# Patient Record
Sex: Female | Born: 1935 | Hispanic: No | Marital: Married | Smoking: Never smoker
Health system: Southern US, Community
[De-identification: ages and names within clinical notes are randomized; demographics above are authoritative.]

## PROBLEM LIST (undated history)

## (undated) DIAGNOSIS — I2510 Atherosclerotic heart disease of native coronary artery without angina pectoris: Secondary | ICD-10-CM

## (undated) DIAGNOSIS — M79609 Pain in unspecified limb: Secondary | ICD-10-CM

## (undated) DIAGNOSIS — F329 Major depressive disorder, single episode, unspecified: Secondary | ICD-10-CM

## (undated) DIAGNOSIS — R079 Chest pain, unspecified: Secondary | ICD-10-CM

## (undated) DIAGNOSIS — J9690 Respiratory failure, unspecified, unspecified whether with hypoxia or hypercapnia: Secondary | ICD-10-CM

## (undated) DIAGNOSIS — D649 Anemia, unspecified: Secondary | ICD-10-CM

## (undated) DIAGNOSIS — N183 Chronic kidney disease, stage 3 unspecified: Secondary | ICD-10-CM

## (undated) DIAGNOSIS — I639 Cerebral infarction, unspecified: Secondary | ICD-10-CM

## (undated) DIAGNOSIS — I10 Essential (primary) hypertension: Secondary | ICD-10-CM

## (undated) DIAGNOSIS — F32A Depression, unspecified: Secondary | ICD-10-CM

## (undated) DIAGNOSIS — I429 Cardiomyopathy, unspecified: Secondary | ICD-10-CM

## (undated) DIAGNOSIS — I509 Heart failure, unspecified: Secondary | ICD-10-CM

## (undated) DIAGNOSIS — F419 Anxiety disorder, unspecified: Secondary | ICD-10-CM

## (undated) DIAGNOSIS — N289 Disorder of kidney and ureter, unspecified: Secondary | ICD-10-CM

## (undated) HISTORY — PX: CARDIAC SURGERY: SHX584

## (undated) HISTORY — DX: Chest pain, unspecified: R07.9

## (undated) HISTORY — DX: Pain in unspecified limb: M79.609

## (undated) HISTORY — DX: Heart failure, unspecified: I50.9

---

## 1999-02-23 ENCOUNTER — Inpatient Hospital Stay (HOSPITAL_COMMUNITY): Admission: AD | Admit: 1999-02-23 | Discharge: 1999-03-09 | Payer: Self-pay | Admitting: Cardiovascular Disease

## 1999-02-23 ENCOUNTER — Encounter: Payer: Self-pay | Admitting: Cardiovascular Disease

## 1999-02-24 ENCOUNTER — Encounter: Payer: Self-pay | Admitting: Thoracic Surgery (Cardiothoracic Vascular Surgery)

## 1999-02-24 HISTORY — PX: CORONARY ARTERY BYPASS GRAFT: SHX141

## 1999-02-25 ENCOUNTER — Encounter: Payer: Self-pay | Admitting: Thoracic Surgery (Cardiothoracic Vascular Surgery)

## 1999-02-26 ENCOUNTER — Encounter: Payer: Self-pay | Admitting: Thoracic Surgery (Cardiothoracic Vascular Surgery)

## 1999-02-27 ENCOUNTER — Encounter: Payer: Self-pay | Admitting: Thoracic Surgery (Cardiothoracic Vascular Surgery)

## 1999-02-28 ENCOUNTER — Encounter: Payer: Self-pay | Admitting: Thoracic Surgery (Cardiothoracic Vascular Surgery)

## 1999-03-01 ENCOUNTER — Encounter: Payer: Self-pay | Admitting: Thoracic Surgery (Cardiothoracic Vascular Surgery)

## 1999-03-06 ENCOUNTER — Encounter: Payer: Self-pay | Admitting: Thoracic Surgery (Cardiothoracic Vascular Surgery)

## 2001-04-30 ENCOUNTER — Ambulatory Visit (HOSPITAL_COMMUNITY): Admission: RE | Admit: 2001-04-30 | Discharge: 2001-04-30 | Payer: Self-pay | Admitting: Endocrinology

## 2001-04-30 ENCOUNTER — Encounter: Payer: Self-pay | Admitting: Endocrinology

## 2001-05-02 ENCOUNTER — Encounter: Payer: Self-pay | Admitting: Endocrinology

## 2001-05-02 ENCOUNTER — Ambulatory Visit (HOSPITAL_COMMUNITY): Admission: RE | Admit: 2001-05-02 | Discharge: 2001-05-02 | Payer: Self-pay | Admitting: Endocrinology

## 2002-02-16 ENCOUNTER — Ambulatory Visit: Admission: RE | Admit: 2002-02-16 | Discharge: 2002-02-16 | Payer: Self-pay | Admitting: Family Medicine

## 2003-05-26 ENCOUNTER — Encounter: Admission: RE | Admit: 2003-05-26 | Discharge: 2003-07-26 | Payer: Self-pay | Admitting: Rheumatology

## 2004-08-08 ENCOUNTER — Emergency Department (HOSPITAL_COMMUNITY): Admission: EM | Admit: 2004-08-08 | Discharge: 2004-08-09 | Payer: Self-pay | Admitting: Emergency Medicine

## 2004-09-26 ENCOUNTER — Inpatient Hospital Stay (HOSPITAL_COMMUNITY): Admission: EM | Admit: 2004-09-26 | Discharge: 2004-09-30 | Payer: Self-pay | Admitting: Emergency Medicine

## 2004-09-26 HISTORY — PX: CARDIAC CATHETERIZATION: SHX172

## 2004-09-27 ENCOUNTER — Encounter (INDEPENDENT_AMBULATORY_CARE_PROVIDER_SITE_OTHER): Payer: Self-pay | Admitting: *Deleted

## 2004-10-27 ENCOUNTER — Inpatient Hospital Stay (HOSPITAL_COMMUNITY): Admission: RE | Admit: 2004-10-27 | Discharge: 2004-10-31 | Payer: Self-pay | Admitting: Cardiovascular Disease

## 2004-10-27 HISTORY — PX: CARDIAC CATHETERIZATION: SHX172

## 2008-07-01 ENCOUNTER — Ambulatory Visit (HOSPITAL_BASED_OUTPATIENT_CLINIC_OR_DEPARTMENT_OTHER): Admission: RE | Admit: 2008-07-01 | Discharge: 2008-07-01 | Payer: Self-pay | Admitting: Family Medicine

## 2009-04-11 ENCOUNTER — Inpatient Hospital Stay (HOSPITAL_COMMUNITY): Admission: EM | Admit: 2009-04-11 | Discharge: 2009-04-15 | Payer: Self-pay | Admitting: Internal Medicine

## 2010-03-17 ENCOUNTER — Encounter: Admission: RE | Admit: 2010-03-17 | Discharge: 2010-03-17 | Payer: Self-pay | Admitting: Family Medicine

## 2010-10-14 ENCOUNTER — Encounter: Payer: Self-pay | Admitting: Family Medicine

## 2010-10-15 ENCOUNTER — Encounter: Payer: Self-pay | Admitting: Family Medicine

## 2010-12-31 LAB — CBC
HCT: 28.9 %
Hemoglobin: 9.4 g/dL
MCHC: 32.4 g/dL
MCV: 80.2 fL
Platelets: 182 10*3/uL
RBC: 3.61 MIL/uL
RDW: 16.2 %
WBC: 8.4 10*3/uL

## 2010-12-31 LAB — URINALYSIS, ROUTINE W REFLEX MICROSCOPIC
Bilirubin Urine: NEGATIVE
Glucose, UA: NEGATIVE mg/dL
Ketones, ur: NEGATIVE mg/dL
Protein, ur: NEGATIVE mg/dL
Specific Gravity, Urine: 1.011
Urobilinogen, UA: 0.2 mg/dL
pH: 5.5

## 2010-12-31 LAB — URINE CULTURE: Colony Count: >=100000

## 2010-12-31 LAB — BRAIN NATRIURETIC PEPTIDE: Pro B Natriuretic peptide (BNP): 268 pg/mL

## 2010-12-31 LAB — COMPREHENSIVE METABOLIC PANEL
ALT: 12 U/L
AST: 18 U/L
Albumin: 3 g/dL
Alkaline Phosphatase: 77 U/L
BUN: 31 mg/dL
CO2: 21 mEq/L
Calcium: 8.6 mg/dL
Chloride: 104 mEq/L
Creatinine, Ser: 1.18 mg/dL
GFR calc Af Amer: 54 mL/min
GFR calc non Af Amer: 45 mL/min
Glucose, Bld: 168 mg/dL
Potassium: 4.9 mEq/L
Sodium: 131 mEq/L
Total Bilirubin: 0.3 mg/dL
Total Protein: 6.2 g/dL

## 2010-12-31 LAB — CARDIAC PANEL(CRET KIN+CKTOT+MB+TROPI)
CK, MB: 1.8 ng/mL
CK, MB: 4.7 ng/mL
CK, MB: 8.5 ng/mL
Relative Index: INVALID
Relative Index: INVALID
Relative Index: INVALID
Total CK: 56 U/L
Total CK: 73 U/L
Troponin I: 0.03 ng/mL
Troponin I: 0.29 ng/mL
Troponin I: 0.46 ng/mL
Troponin I: 0.62 ng/mL

## 2010-12-31 LAB — TSH: TSH: 0.381 u[IU]/mL (ref 0.350–4.500)

## 2010-12-31 LAB — DIFFERENTIAL
Basophils Absolute: 0 10*3/uL
Basophils Relative: 1 %
Eosinophils Absolute: 0 10*3/uL
Eosinophils Relative: 0 %
Lymphocytes Relative: 10 %
Lymphs Abs: 0.8 10*3/uL
Monocytes Absolute: 0.6 10*3/uL
Monocytes Relative: 7 %
Neutro Abs: 6.8 10*3/uL
Neutrophils Relative %: 82 %

## 2010-12-31 LAB — URINE MICROSCOPIC-ADD ON

## 2010-12-31 LAB — LIPID PANEL
Cholesterol: 118 mg/dL
HDL: 38 mg/dL
LDL Cholesterol: 59 mg/dL
Total CHOL/HDL Ratio: 3.1 RATIO
Triglycerides: 107 mg/dL
VLDL: 21 mg/dL

## 2010-12-31 LAB — PROTIME-INR
INR: 1.1
Prothrombin Time: 15 seconds

## 2010-12-31 LAB — SODIUM, URINE, RANDOM: Sodium, Ur: 57 mEq/L

## 2010-12-31 LAB — RETICULOCYTES
RBC.: 3.64 MIL/uL (ref 3.87–5.11)
Retic Count, Absolute: 58.2 10*3/uL (ref 19.0–186.0)
Retic Ct Pct: 1.6 % (ref 0.4–3.1)

## 2010-12-31 LAB — APTT: aPTT: 53 seconds

## 2010-12-31 LAB — URIC ACID: Uric Acid, Serum: 5.8 mg/dL

## 2010-12-31 LAB — IRON AND TIBC
Iron: 35 ug/dL (ref 42–135)
Saturation Ratios: 13 % (ref 20–55)
TIBC: 270 ug/dL (ref 250–470)
UIBC: 235 ug/dL

## 2010-12-31 LAB — VITAMIN B12: Vitamin B-12: 421 pg/mL (ref 211–911)

## 2010-12-31 LAB — CREATININE, URINE, RANDOM: Creatinine, Urine: 54.5 mg/dL

## 2010-12-31 LAB — FERRITIN: Ferritin: 55 ng/mL (ref 10–291)

## 2010-12-31 LAB — FOLATE: Folate: >20 ng/mL

## 2011-01-08 ENCOUNTER — Inpatient Hospital Stay (HOSPITAL_COMMUNITY)
Admission: EM | Admit: 2011-01-08 | Discharge: 2011-01-16 | DRG: 811 | Disposition: A | Discharge status: Skilled Nursing Facility | Payer: Medicare Other | Attending: Internal Medicine | Admitting: Internal Medicine

## 2011-01-08 ENCOUNTER — Emergency Department (HOSPITAL_COMMUNITY): Payer: Medicare Other

## 2011-01-08 DIAGNOSIS — M79609 Pain in unspecified limb: Secondary | ICD-10-CM

## 2011-01-08 DIAGNOSIS — I10 Essential (primary) hypertension: Secondary | ICD-10-CM | POA: Diagnosis present

## 2011-01-08 DIAGNOSIS — N390 Urinary tract infection, site not specified: Secondary | ICD-10-CM | POA: Diagnosis present

## 2011-01-08 DIAGNOSIS — K648 Other hemorrhoids: Secondary | ICD-10-CM | POA: Diagnosis present

## 2011-01-08 DIAGNOSIS — D509 Iron deficiency anemia, unspecified: Principal | ICD-10-CM | POA: Diagnosis present

## 2011-01-08 DIAGNOSIS — K559 Vascular disorder of intestine, unspecified: Secondary | ICD-10-CM | POA: Diagnosis present

## 2011-01-08 DIAGNOSIS — A498 Other bacterial infections of unspecified site: Secondary | ICD-10-CM | POA: Diagnosis present

## 2011-01-08 DIAGNOSIS — D72829 Elevated white blood cell count, unspecified: Secondary | ICD-10-CM | POA: Diagnosis present

## 2011-01-08 DIAGNOSIS — K644 Residual hemorrhoidal skin tags: Secondary | ICD-10-CM | POA: Diagnosis present

## 2011-01-08 DIAGNOSIS — K219 Gastro-esophageal reflux disease without esophagitis: Secondary | ICD-10-CM | POA: Diagnosis present

## 2011-01-08 DIAGNOSIS — J189 Pneumonia, unspecified organism: Secondary | ICD-10-CM | POA: Diagnosis present

## 2011-01-08 DIAGNOSIS — N179 Acute kidney failure, unspecified: Secondary | ICD-10-CM | POA: Diagnosis present

## 2011-01-08 DIAGNOSIS — I2510 Atherosclerotic heart disease of native coronary artery without angina pectoris: Secondary | ICD-10-CM | POA: Diagnosis present

## 2011-01-08 DIAGNOSIS — M109 Gout, unspecified: Secondary | ICD-10-CM | POA: Diagnosis present

## 2011-01-08 DIAGNOSIS — K5900 Constipation, unspecified: Secondary | ICD-10-CM | POA: Diagnosis present

## 2011-01-08 DIAGNOSIS — M25469 Effusion, unspecified knee: Secondary | ICD-10-CM | POA: Diagnosis present

## 2011-01-08 DIAGNOSIS — Z951 Presence of aortocoronary bypass graft: Secondary | ICD-10-CM

## 2011-01-08 HISTORY — DX: Essential (primary) hypertension: I10

## 2011-01-08 LAB — CARDIAC PANEL(CRET KIN+CKTOT+MB+TROPI)
Relative Index: INVALID (ref 0.0–2.5)
Troponin I: 0.05 ng/mL (ref 0.00–0.06)

## 2011-01-08 LAB — COMPREHENSIVE METABOLIC PANEL
ALT: 12 U/L (ref 0–35)
Alkaline Phosphatase: 80 U/L (ref 39–117)
BUN: 22 mg/dL (ref 6–23)
CO2: 26 mEq/L (ref 19–32)
Calcium: 8.5 mg/dL (ref 8.4–10.5)
GFR calc non Af Amer: 44 mL/min — ABNORMAL LOW (ref >60)
Glucose, Bld: 153 mg/dL — ABNORMAL HIGH (ref 70–99)
Sodium: 131 mEq/L — ABNORMAL LOW (ref 135–145)

## 2011-01-08 LAB — CK TOTAL AND CKMB (NOT AT ARMC)
Relative Index: INVALID (ref 0.0–2.5)
Total CK: 41 U/L (ref 7–177)

## 2011-01-08 LAB — FERRITIN: Ferritin: 11 ng/mL (ref 10–291)

## 2011-01-08 LAB — DIFFERENTIAL
Basophils Absolute: 0 10*3/uL (ref 0.0–0.1)
Basophils Relative: 0 % (ref 0–1)
Eosinophils Relative: 1 % (ref 0–5)
Lymphocytes Relative: 12 % (ref 12–46)
Monocytes Relative: 12 % (ref 3–12)
Neutro Abs: 6.9 10*3/uL (ref 1.7–7.7)

## 2011-01-08 LAB — FOLATE: Folate: >20 ng/mL

## 2011-01-08 LAB — PROTIME-INR: Prothrombin Time: 12.9 seconds (ref 11.6–15.2)

## 2011-01-08 LAB — POCT CARDIAC MARKERS
Myoglobin, poc: 88.3 ng/mL (ref 12–200)
Troponin i, poc: <0.05 ng/mL (ref 0.00–0.09)

## 2011-01-08 LAB — CBC
HCT: 21.9 % — ABNORMAL LOW (ref 36.0–46.0)
Hemoglobin: 6.6 g/dL — CL (ref 12.0–15.0)
MCHC: 30.1 g/dL (ref 30.0–36.0)
MCV: 72.5 fL — ABNORMAL LOW (ref 78.0–100.0)

## 2011-01-08 LAB — GLUCOSE, CAPILLARY: Glucose-Capillary: 131 mg/dL — ABNORMAL HIGH (ref 70–99)

## 2011-01-08 LAB — TROPONIN I: Troponin I: 0.06 ng/mL (ref 0.00–0.06)

## 2011-01-08 LAB — BRAIN NATRIURETIC PEPTIDE: Pro B Natriuretic peptide (BNP): 65 pg/mL (ref 0.0–100.0)

## 2011-01-08 LAB — VITAMIN B12: Vitamin B-12: 408 pg/mL (ref 211–911)

## 2011-01-08 LAB — IRON AND TIBC: Iron: 16 ug/dL — ABNORMAL LOW (ref 42–135)

## 2011-01-09 ENCOUNTER — Inpatient Hospital Stay (HOSPITAL_COMMUNITY): Payer: Medicare Other

## 2011-01-09 ENCOUNTER — Encounter (HOSPITAL_COMMUNITY): Payer: Self-pay | Admitting: Radiology

## 2011-01-09 LAB — GLUCOSE, CAPILLARY
Glucose-Capillary: 137 mg/dL — ABNORMAL HIGH (ref 70–99)
Glucose-Capillary: 118 mg/dL — ABNORMAL HIGH (ref 70–99)
Glucose-Capillary: 211 mg/dL — ABNORMAL HIGH (ref 70–99)

## 2011-01-09 LAB — COMPREHENSIVE METABOLIC PANEL
ALT: 17 U/L (ref 0–35)
BUN: 18 mg/dL (ref 6–23)
CO2: 28 mEq/L (ref 19–32)
Calcium: 8.5 mg/dL (ref 8.4–10.5)
GFR calc non Af Amer: 58 mL/min — ABNORMAL LOW (ref >60)
Glucose, Bld: 130 mg/dL — ABNORMAL HIGH (ref 70–99)
Sodium: 139 mEq/L (ref 135–145)
Total Protein: 6.5 g/dL (ref 6.0–8.3)

## 2011-01-09 LAB — CBC
HCT: 30.7 % — ABNORMAL LOW (ref 36.0–46.0)
Hemoglobin: 9.6 g/dL — ABNORMAL LOW (ref 12.0–15.0)
MCH: 23.7 pg — ABNORMAL LOW (ref 26.0–34.0)
MCHC: 31.3 g/dL (ref 30.0–36.0)
MCV: 75.8 fL — ABNORMAL LOW (ref 78.0–100.0)
RDW: 17.8 % — ABNORMAL HIGH (ref 11.5–15.5)

## 2011-01-09 LAB — CARDIAC PANEL(CRET KIN+CKTOT+MB+TROPI)
CK, MB: 0.8 ng/mL (ref 0.3–4.0)
Relative Index: INVALID (ref 0.0–2.5)
Troponin I: 0.04 ng/mL (ref 0.00–0.06)

## 2011-01-09 LAB — DIFFERENTIAL
Basophils Absolute: 0 10*3/uL (ref 0.0–0.1)
Eosinophils Relative: 1 % (ref 0–5)
Lymphocytes Relative: 12 % (ref 12–46)
Lymphs Abs: 1 10*3/uL (ref 0.7–4.0)
Monocytes Absolute: 0.9 10*3/uL (ref 0.1–1.0)
Monocytes Relative: 10 % (ref 3–12)
Neutro Abs: 6.4 10*3/uL (ref 1.7–7.7)

## 2011-01-09 LAB — CROSSMATCH

## 2011-01-09 MED ORDER — IOHEXOL 300 MG/ML  SOLN
100.0000 mL | Freq: Once | INTRAMUSCULAR | Status: AC | PRN
  Administered 2011-01-09: 100 mL via INTRAVENOUS

## 2011-01-10 ENCOUNTER — Inpatient Hospital Stay (HOSPITAL_COMMUNITY): Payer: Medicare Other

## 2011-01-10 LAB — URINALYSIS, ROUTINE W REFLEX MICROSCOPIC
Glucose, UA: NEGATIVE mg/dL
Hgb urine dipstick: NEGATIVE
Ketones, ur: NEGATIVE mg/dL
Protein, ur: NEGATIVE mg/dL
Urobilinogen, UA: 0.2 mg/dL (ref 0.0–1.0)

## 2011-01-10 LAB — COMPREHENSIVE METABOLIC PANEL
ALT: 21 U/L (ref 0–35)
Albumin: 3 g/dL — ABNORMAL LOW (ref 3.5–5.2)
BUN: 20 mg/dL (ref 6–23)
Calcium: 8.5 mg/dL (ref 8.4–10.5)
Glucose, Bld: 148 mg/dL — ABNORMAL HIGH (ref 70–99)
Potassium: 3.8 mEq/L (ref 3.5–5.1)
Sodium: 137 mEq/L (ref 135–145)
Total Protein: 6.9 g/dL (ref 6.0–8.3)

## 2011-01-10 LAB — HEMOGLOBINOPATHY EVALUATION: Hemoglobin Other: 0 % (ref 0.0–0.0)

## 2011-01-10 LAB — DIFFERENTIAL
Basophils Relative: 0 % (ref 0–1)
Lymphs Abs: 1 10*3/uL (ref 0.7–4.0)
Monocytes Relative: 13 % — ABNORMAL HIGH (ref 3–12)
Neutro Abs: 11.2 10*3/uL — ABNORMAL HIGH (ref 1.7–7.7)
Neutrophils Relative %: 79 % — ABNORMAL HIGH (ref 43–77)

## 2011-01-10 LAB — HEMOGLOBIN A1C
Hgb A1c MFr Bld: 5.7 % — ABNORMAL HIGH (ref <5.7)
Mean Plasma Glucose: 117 mg/dL — ABNORMAL HIGH (ref <117)

## 2011-01-10 LAB — CBC
HCT: 31.2 % — ABNORMAL LOW (ref 36.0–46.0)
MCHC: 31.4 g/dL (ref 30.0–36.0)
Platelets: 283 10*3/uL (ref 150–400)
RBC: 4.1 MIL/uL (ref 3.87–5.11)
RDW: 18.5 % — ABNORMAL HIGH (ref 11.5–15.5)

## 2011-01-10 LAB — GLUCOSE, CAPILLARY
Glucose-Capillary: 152 mg/dL — ABNORMAL HIGH (ref 70–99)
Glucose-Capillary: 151 mg/dL — ABNORMAL HIGH (ref 70–99)
Glucose-Capillary: 181 mg/dL — ABNORMAL HIGH (ref 70–99)

## 2011-01-10 LAB — MAGNESIUM: Magnesium: 2 mg/dL (ref 1.5–2.5)

## 2011-01-11 ENCOUNTER — Inpatient Hospital Stay (HOSPITAL_COMMUNITY): Payer: Medicare Other

## 2011-01-11 LAB — DIFFERENTIAL
Basophils Absolute: 0 10*3/uL (ref 0.0–0.1)
Basophils Relative: 0 % (ref 0–1)
Eosinophils Absolute: 0 10*3/uL (ref 0.0–0.7)
Neutro Abs: 9.7 10*3/uL — ABNORMAL HIGH (ref 1.7–7.7)
Neutrophils Relative %: 74 % (ref 43–77)

## 2011-01-11 LAB — SYNOVIAL CELL COUNT + DIFF, W/ CRYSTALS
Lymphocytes-Synovial Fld: 0 % (ref 0–20)
Monocyte-Macrophage-Synovial Fluid: 30 % — ABNORMAL LOW (ref 50–90)
Neutrophil, Synovial: 70 % — ABNORMAL HIGH (ref 0–25)
WBC, Synovial: 16707 /mm3 — ABNORMAL HIGH (ref 0–200)

## 2011-01-11 LAB — COMPREHENSIVE METABOLIC PANEL
ALT: 42 U/L — ABNORMAL HIGH (ref 0–35)
AST: 44 U/L — ABNORMAL HIGH (ref 0–37)
Albumin: 2.8 g/dL — ABNORMAL LOW (ref 3.5–5.2)
Chloride: 95 mEq/L — ABNORMAL LOW (ref 96–112)
Creatinine, Ser: 1.54 mg/dL — ABNORMAL HIGH (ref 0.4–1.2)
GFR calc Af Amer: 40 mL/min — ABNORMAL LOW (ref >60)
Potassium: 3.8 mEq/L (ref 3.5–5.1)
Sodium: 135 mEq/L (ref 135–145)
Total Bilirubin: 0.9 mg/dL (ref 0.3–1.2)

## 2011-01-11 LAB — CBC
MCH: 23.4 pg — ABNORMAL LOW (ref 26.0–34.0)
Platelets: 257 10*3/uL (ref 150–400)
RBC: 4.15 MIL/uL (ref 3.87–5.11)
WBC: 13 10*3/uL — ABNORMAL HIGH (ref 4.0–10.5)

## 2011-01-11 LAB — PROCALCITONIN: Procalcitonin: 0.14 ng/mL

## 2011-01-11 LAB — GLUCOSE, CAPILLARY
Glucose-Capillary: 140 mg/dL — ABNORMAL HIGH (ref 70–99)
Glucose-Capillary: 223 mg/dL — ABNORMAL HIGH (ref 70–99)

## 2011-01-12 LAB — CBC
HCT: 29.2 % — ABNORMAL LOW (ref 36.0–46.0)
MCH: 23.5 pg — ABNORMAL LOW (ref 26.0–34.0)
MCV: 74.7 fL — ABNORMAL LOW (ref 78.0–100.0)
Platelets: 239 10*3/uL (ref 150–400)
RDW: 18.6 % — ABNORMAL HIGH (ref 11.5–15.5)
WBC: 12 10*3/uL — ABNORMAL HIGH (ref 4.0–10.5)

## 2011-01-12 LAB — COMPREHENSIVE METABOLIC PANEL
Albumin: 2.6 g/dL — ABNORMAL LOW (ref 3.5–5.2)
Alkaline Phosphatase: 133 U/L — ABNORMAL HIGH (ref 39–117)
BUN: 30 mg/dL — ABNORMAL HIGH (ref 6–23)
Creatinine, Ser: 1.35 mg/dL — ABNORMAL HIGH (ref 0.4–1.2)
Glucose, Bld: 159 mg/dL — ABNORMAL HIGH (ref 70–99)
Potassium: 3.5 mEq/L (ref 3.5–5.1)
Total Bilirubin: 0.7 mg/dL (ref 0.3–1.2)
Total Protein: 6.7 g/dL (ref 6.0–8.3)

## 2011-01-12 LAB — GLUCOSE, CAPILLARY: Glucose-Capillary: 174 mg/dL — ABNORMAL HIGH (ref 70–99)

## 2011-01-12 LAB — URIC ACID: Uric Acid, Serum: 11.2 mg/dL — ABNORMAL HIGH (ref 2.4–7.0)

## 2011-01-12 NOTE — Consult Note (Signed)
NAMEAREANA, KOSANKE             ACCOUNT NO.:  [id number]  MEDICAL RECORD NO.:  [id number]           PATIENT TYPE:  I  LOCATION:  3743                         FACILITY:  [facility]  PHYSICIAN:  Madlyn Frankel. Charlann Boxer, M.D.  DATE OF BIRTH:  16-May-1931  DATE OF CONSULTATION:  01/11/2011 DATE OF DISCHARGE:                                CONSULTATION   Consultation to rule out septic arthritis of right knee versus gout.  HISTORY:  Ms. Latchford is a 77 year old female admitted to the hospital on January 08, 2011, for anemia and chest pain.  She had been seen and evaluated by the medical physicians at [facility], Cardiology, as well as GI.  Now on hospital day #3, there has been concern of bilateral lower extremity swelling particularly right knee swelling with pain associated with the fever of unknown origin at this point.  Orthopedics was consulted to evaluate the right knee to rule out infection.  At the time of evaluation, Ms. Bellevue was seen with her husband and children.  She does not speak [place] and thus they acted as interpreters as well as to help with history.  She has apparently had right knee pain for greater than 5 years.  She has no known history of gout.  Review of her medical history includes iron deficiency anemia, reflux disease, history of previous coronary artery disease, myocardial infarction, history of cerebrovascular accident, congestive heart failure, and history of coronary bypass surgery.  FAMILY MEDICAL HISTORY:  Negative for diabetes or gout.  REVIEW OF SYSTEMS:  As noted in the HPI and previous admitting history.  CURRENT MEDICATIONS AT HOME: 1. Primidone 50 mg at bedtime. 2. Plavix 75 mg daily. 3. Folic acid 1 mg daily. 4. Niferex 150 mg b.i.d. 5. Imdur 30 mg daily. 6. Metoprolol 75 mg q.a.m., 50 mg at bedtime. 7. Aspirin 81 mg daily. 8. Vytorin 10/20 daily. 9. Metformin XR 50 mg b.i.d. 10.Torsemide 20 mg 2-3 tablets daily. 11.Nexium 40 mg  daily. 12.Spironolactone 25 mg daily.  PHYSICAL EXAMINATION:  Ms. Cutsforth is in her hospital bed at 3743 at [facility]. Her overall appearance appeared to be stable with nonseptic.  She was noted to have fevers of over 102 during the past 24 hours.  The vital signs were otherwise stable.  She was warm to palpation throughout her body, not just about her right knee.  She had larger lower extremities consistent with some edematous changes as well as adipose tissue.  Her legs were both externally rotated with the knees flexed.  She had pain with movements of both her knee, pain without palpation of both her knees, and a palpable effusion on both knees, perhaps the right side a little bet greater than left.  No asymmetric warmth.  No signs of cellulitis about the knee or lower extremities.  She otherwise had palpable pulses, reportedly intact sensibility.  Radiographs were reviewed of her right knee obtained.  This revealed advanced right knee osteoarthritis predominantly in the medial and anterior compartments.  No evidence of any fractures or avulsions. ASSESSMENT:  Right knee effusion with associated fevers, unknown origin, rule out septic arthritis.  PLAN:  After reviewing with, Ms. Obey family current situation under Betadine and sterile prep, lidocaine was infused in lateral and suprapatellar region followed by an aspiration with a 60 mL syringe.  I was able to obtain approximately 12 mL of fluid, which had a normal synovial appearance slightly inflammatory.  No concern for obvious purulence or infection at this time.  Her synovial fluid will be sent to lab for analysis for cell count with differential.  Crystal examination, Gram stain culture.  At this point, I did feel this is infection thus no emergent surgery is required.  Follow up evaluation of lab counts, and Crystal will determine the course of treatment at this point.  We will follow along with the results to  make certain that there is no concern for infection and require an I and D.  Otherwise, it can be managed medically.     Madlyn Frankel Charlann Boxer, M.D.     MDO/MEDQ  D:  01/11/2011  T:  01/12/2011  Job:  [id number]  Electronically Signed by Durene Romans M.D. on 01/12/2011 03:45:03 PM

## 2011-01-13 LAB — URINE CULTURE
Colony Count: >=100000
Culture  Setup Time: 201204180347

## 2011-01-13 LAB — DIFFERENTIAL
Lymphocytes Relative: 9 % — ABNORMAL LOW (ref 12–46)
Lymphs Abs: 0.9 10*3/uL (ref 0.7–4.0)
Monocytes Relative: 11 % (ref 3–12)
Neutrophils Relative %: 80 % — ABNORMAL HIGH (ref 43–77)

## 2011-01-13 LAB — CBC
HCT: 29.3 % — ABNORMAL LOW (ref 36.0–46.0)
MCH: 23.1 pg — ABNORMAL LOW (ref 26.0–34.0)
MCV: 75.3 fL — ABNORMAL LOW (ref 78.0–100.0)
RBC: 3.89 MIL/uL (ref 3.87–5.11)
WBC: 10.7 10*3/uL — ABNORMAL HIGH (ref 4.0–10.5)

## 2011-01-13 LAB — BASIC METABOLIC PANEL
BUN: 21 mg/dL (ref 6–23)
CO2: 28 mEq/L (ref 19–32)
Chloride: 96 mEq/L (ref 96–112)
Glucose, Bld: 146 mg/dL — ABNORMAL HIGH (ref 70–99)
Potassium: 4.2 mEq/L (ref 3.5–5.1)

## 2011-01-13 LAB — GLUCOSE, CAPILLARY: Glucose-Capillary: 175 mg/dL — ABNORMAL HIGH (ref 70–99)

## 2011-01-14 LAB — BASIC METABOLIC PANEL
BUN: 20 mg/dL (ref 6–23)
Chloride: 96 mEq/L (ref 96–112)
Potassium: 4.6 mEq/L (ref 3.5–5.1)

## 2011-01-14 LAB — CBC
HCT: 28.7 % — ABNORMAL LOW (ref 36.0–46.0)
Hemoglobin: 8.9 g/dL — ABNORMAL LOW (ref 12.0–15.0)
MCV: 75.5 fL — ABNORMAL LOW (ref 78.0–100.0)
Platelets: 259 10*3/uL (ref 150–400)
RBC: 3.8 MIL/uL — ABNORMAL LOW (ref 3.87–5.11)
WBC: 10.9 10*3/uL — ABNORMAL HIGH (ref 4.0–10.5)

## 2011-01-14 LAB — DIFFERENTIAL
Eosinophils Absolute: 0 10*3/uL (ref 0.0–0.7)
Lymphocytes Relative: 5 % — ABNORMAL LOW (ref 12–46)
Lymphs Abs: 0.6 10*3/uL — ABNORMAL LOW (ref 0.7–4.0)
Neutro Abs: 9.8 10*3/uL — ABNORMAL HIGH (ref 1.7–7.7)
Neutrophils Relative %: 90 % — ABNORMAL HIGH (ref 43–77)

## 2011-01-14 LAB — GLUCOSE, CAPILLARY
Glucose-Capillary: 159 mg/dL — ABNORMAL HIGH (ref 70–99)
Glucose-Capillary: 156 mg/dL — ABNORMAL HIGH (ref 70–99)

## 2011-01-14 LAB — CLOSTRIDIUM DIFFICILE BY PCR: Toxigenic C. Difficile by PCR: NEGATIVE

## 2011-01-14 LAB — MAGNESIUM: Magnesium: 2.9 mg/dL — ABNORMAL HIGH (ref 1.5–2.5)

## 2011-01-14 NOTE — Op Note (Signed)
  [name], [name]             ACCOUNT NO.:  [id number]  MEDICAL RECORD NO.:  [id number]           PATIENT TYPE:  I  LOCATION:  3743                         FACILITY:  [facility]  PHYSICIAN:  Petra Kuba, M.D.    DATE OF BIRTH:  Apr 21, 1931  DATE OF PROCEDURE:  01/12/2011 DATE OF DISCHARGE:                              OPERATIVE REPORT   PROCEDURE:  Endoscopy.  INDICATION:  Guaiac-positive anemia, episodic GI complaints.  Consent was signed after risks, benefits, methods, and options were thoroughly discussed multiple times with the patient and her family.  MEDICINES USED:  Fentanyl 37.5 mcg and Versed 3.5 mg.  PROCEDURE:  The video endoscope was inserted by direct vision.  The esophagus was normal.  She did have a tiny small hiatal hernia.  Scope was passed into the stomach and advanced through the antrum, pertinent for a minimal amount of antritis and advanced through a normal pylorus into a normal duodenal bulb around the C-loop to a normal second portion of the duodenum.  No blood was seen distally.  Scope was withdrawn back to the bulb and a good look there ruled out abnormalities in that location.  Scope was withdrawn back to the stomach and retroflexed. Cardia, fundus, angularis, and lesser and greater curve were evaluated on retroflex and straight visualization and noted the mild distal gastritis and antritis as mentioned above.  No other abnormalities were seen.  Air was suctioned.  Scope was slowly withdrawn again and a good look at the esophagus was normal.  Scope was removed.  The patient tolerated the procedure well.  There was no obvious immediate complication.  ENDOSCOPIC DIAGNOSES: 1. Small hiatal hernia. 2. Minimal gastritis and antritis. 3. Otherwise normal EGD without any signs of bleeding.  PLAN:  Proceed with colonoscopy on Sunday.  Probably, it will take 2 days to clean her out gently.  In the meantime, hold her iron and further workup plans pending  those findings.          ______________________________ Petra Kuba, M.D.    MEM/MEDQ  D:  01/12/2011  T:  01/13/2011  Job:  [id number]  cc:   Nicki Guadalajara, M.D.  Electronically Signed by Vida Rigger M.D. on 01/14/2011 11:22:43 AM

## 2011-01-14 NOTE — Op Note (Signed)
  [name], [name]             ACCOUNT NO.:  [id number]  MEDICAL RECORD NO.:  [id number]           PATIENT TYPE:  I  LOCATION:  3743                         FACILITY:  [facility]  PHYSICIAN:  Petra Kuba, M.D.    DATE OF BIRTH:  1930/11/10  DATE OF PROCEDURE:  01/14/2011 DATE OF DISCHARGE:                              OPERATIVE REPORT   PROCEDURE:  Colonoscopy.  INDICATIONS:  Episodic guaiac positivity chronic anemia.  Consent was signed after risks, benefits, methods, options thoroughly discussed with multiple family members on multiple occasions.  MEDICINES USED:  Fentanyl 50 mcg, Versed 4 mg.  PROCEDURE:  Rectal inspection is pertinent for external hemorrhoids small.  Digital exam was negative.  The video colonoscope was inserted and with minimal abdominal pressure, despite a long looping tortuous colon was able to be advanced to the cecum.  No signs of bleeding or abnormalities were seen on insertion.  The cecum was identified by the appendiceal orifice and ileocecal valve.  __________ scope was inserted short ways in the terminal ileum which was normal.  No blood was seen coming from above.  The scope was slowly withdrawn.  The prep was surprisingly adequate.  There was some liquid stool that required washing and suctioning and she did have some problems holding air and she did have a long looping colon and when we fell back around a loop we did try to re-advance around the curves to decrease chances of missing things, however, on slow withdrawal through the colon other than a tiny proximal descending polyp which was not biopsied and not worrisome looking no other abnormalities were seen as we slowly withdrew back to the rectum.  Anorectal pull-through and retroflexion confirmed some small hemorrhoids.  Scope was straightened short ways up the left side of the colon.  Air was suctioned, scope removed.  The patient tolerated the procedure well.  There was no obvious  immediate complication.  ENDOSCOPIC DIAGNOSES: 1. Internal-external small hemorrhoids. 2. Long looping tortuous colon. 3. Tiny proximal descending polyp not biopsied. 4. Otherwise, within normal limits to the terminal ileum without any     blood being seen.  PLAN:  Okay to advance diet.  No further GI workup.  Happy to see back p.r.n.          ______________________________ Petra Kuba, M.D.     MEM/MEDQ  D:  01/14/2011  T:  01/14/2011  Job:  [id number]  cc:   Nicki Guadalajara, M.D.  Electronically Signed by Vida Rigger M.D. on 01/14/2011 11:22:46 AM

## 2011-01-14 NOTE — Consult Note (Signed)
[name], [name]             ACCOUNT NO.:  [id number]  MEDICAL RECORD NO.:  [id number]           PATIENT TYPE:  I  LOCATION:  3743                         FACILITY:  [facility]  PHYSICIAN:  Petra Kuba, M.D.    DATE OF BIRTH:  Jun 15, 1931  DATE OF CONSULTATION:  01/08/2011 DATE OF DISCHARGE:                                CONSULTATION   HISTORY:  The patient with a long history of iron deficiency was brought in with probable symptomatic anemia, found to have a hemoglobin of 6.6, but also guaiac positive, and we are consulted for further workup and plans.  Other than constipation helped by stool softener, she really did not have any GI complaints.  History was obtained by the son and the daughter, who speak good [place].  She definitely had an endoscopy in 2006 by Dr. Matthias Hughs, but the family says she also had a colonoscopy at that time, but no report is either in our office chart or in the hospital computer.  PAST MEDICAL HISTORY:  Pertinent for coronary artery disease and a bypass, history of iron deficiency, GERD, MI, CVA, heart failure.  FAMILY HISTORY:  Pertinent for lots of family members with anemia without significant findings.  SOCIAL HISTORY:  She does not smoke or drink, but is on an aspirin a day.  CURRENT MEDICINES:  At home include primidone, Plavix, folic acid, Niferex, Imdur, metoprolol, aspirin, Vytorin, metformin, torsemide, Nexium, and spironolactone and Plavix is not mentioned above.  ALLERGIES:  PENICILLIN.  REVIEW OF SYSTEMS:  Negative except above.  She did get some relaxation medicine and is sleeping.  PHYSICAL EXAMINATION:  GENERAL:  No acute distress. VITAL SIGNS:  Stable and afebrile.  She will be examined tomorrow when she is awake.  LABORATORY DATA:  Pertinent for a normal complete metabolic profile with a BUN of 22, creatinine of 1.20, except for a sugar of 153 and normal liver tests with an albumin of 3.0.  Hemoglobin of 6.6 with an MCV  of 72.5, white count 9.2, platelets normal, retic count was 3.3.  Ferritin was 10.  Iron is low at 16, TIBC normal at 332 with a percent sat of 5, folate and B12 were okay.  ASSESSMENT: 1. Multiple medical problems. 2. Microcytic anemia, longstanding with questionable negative workup     in 2006, on aspirin and Plavix.  PLAN:  I had an incredibly long conversation about the risks, benefits, and methods of colonoscopy and endoscopy with the family.  We also talked about the risks of not doing the procedure and in the future looking back and wish we had and after our prolonged conversation, they have requested a Cardiology consult with Dr. Nicki Guadalajara, who knows her well to see if he feels like she is stable enough from Cardiopulmonary and CNS state to undergo these procedures with further workup and plans pending above.  In the meantime, we will go ahead and hold iron in preparation of possible colonoscopy, which will make the prep go easier and get better visualization.  Thank you very much.  We will wait on [facility] Cardiology opinion and please let me know what they  say.          ______________________________ Petra Kuba, M.D.     MEM/MEDQ  D:  01/08/2011  T:  01/09/2011  Job:  [id number]  cc:   Nicki Guadalajara, M.D.  Electronically Signed by Vida Rigger M.D. on 01/14/2011 11:22:41 AM

## 2011-01-15 LAB — CBC
Hemoglobin: 8.6 g/dL — ABNORMAL LOW (ref 12.0–15.0)
MCH: 23 pg — ABNORMAL LOW (ref 26.0–34.0)
MCHC: 30 g/dL (ref 30.0–36.0)
MCV: 76.7 fL — ABNORMAL LOW (ref 78.0–100.0)
Platelets: 243 10*3/uL (ref 150–400)
RBC: 3.74 MIL/uL — ABNORMAL LOW (ref 3.87–5.11)

## 2011-01-15 LAB — BODY FLUID CULTURE: Culture: NO GROWTH

## 2011-01-15 LAB — GLUCOSE, CAPILLARY
Glucose-Capillary: 138 mg/dL — ABNORMAL HIGH (ref 70–99)
Glucose-Capillary: 122 mg/dL — ABNORMAL HIGH (ref 70–99)

## 2011-01-16 LAB — CBC
HCT: 38 % (ref 36.0–46.0)
Hemoglobin: 12 g/dL (ref 12.0–15.0)
MCH: 24.2 pg — ABNORMAL LOW (ref 26.0–34.0)
MCHC: 31.6 g/dL (ref 30.0–36.0)
MCV: 76.8 fL — ABNORMAL LOW (ref 78.0–100.0)

## 2011-01-16 LAB — CULTURE, BLOOD (ROUTINE X 2)
Culture  Setup Time: 201204180923
Culture: NO GROWTH

## 2011-01-16 LAB — DIFFERENTIAL
Lymphocytes Relative: 12 % (ref 12–46)
Lymphs Abs: 1.4 10*3/uL (ref 0.7–4.0)
Monocytes Absolute: 1.1 10*3/uL — ABNORMAL HIGH (ref 0.1–1.0)
Monocytes Relative: 9 % (ref 3–12)
Neutro Abs: 9.6 10*3/uL — ABNORMAL HIGH (ref 1.7–7.7)

## 2011-01-16 LAB — BASIC METABOLIC PANEL
CO2: 26 mEq/L (ref 19–32)
Calcium: 8.9 mg/dL (ref 8.4–10.5)
Glucose, Bld: 119 mg/dL — ABNORMAL HIGH (ref 70–99)
Sodium: 137 mEq/L (ref 135–145)

## 2011-01-16 LAB — GLUCOSE, CAPILLARY: Glucose-Capillary: 173 mg/dL — ABNORMAL HIGH (ref 70–99)

## 2011-01-17 NOTE — Discharge Summary (Signed)
  [name], [name]             ACCOUNT NO.:  [id number]  MEDICAL RECORD NO.:  [id number]           PATIENT TYPE:  I  LOCATION:  3743                         FACILITY:  [facility]  PHYSICIAN:  Marinda Elk, M.D.DATE OF BIRTH:  1931/02/13  DATE OF ADMISSION:  01/08/2011 DATE OF DISCHARGE:                              DISCHARGE SUMMARY   ADDENDUM:  Addendum to job #[id number].  PRIMARY CARE DOCTOR:  Robert L. Foy Guadalajara, MD  MEDICATIONS: 1. Colchicine 0.6 mg p.o. b.i.d. 2. Levofloxacin 750 mg every day. 3. Prednisone 10 mg daily. 4. Folic acid 1 mg daily. 5. Imdur 30 mg 1 tablet daily. 6. Metformin XR 500 mg b.i.d. 7. Metoprolol 50 mg as directed. 8. Nexium 40 mg daily. 9. Niferex 1 tab b.i.d. 150 mg. 10.Plavix 75 mg daily. 11.Primidone 50 mg one tablet every morning. 12.Spironolactone 25 mg daily. 13.Torsemide 2-3 tablets daily as directed. 14.Vytorin 10/20 mg daily.  DISPOSITION: 1. The patient has a follow-up appointment with hematologist to workup     for anemia. 2. The patient will be going to SNF.  The family has agreed.  VITAL SIGNS:  On day of discharge, her temperature is 97, pulse of 85, respirations 16, blood pressure 152/72.  She was satting 95% on room air.  LABS:  On day of discharge showed phosphorus of 2.9, mag of 2.0, sodium 137, potassium 3.6, chloride 101, bicarb 26, glucose 119, BUN of 17, creatinine 0.8, calcium of 8.9.  Her white count is 12.3 secondary to steroids, hemoglobin of 12, platelet count is 58, ANC of 9.6.     Marinda Elk, M.D.     AF/MEDQ  D:  01/16/2011  T:  01/16/2011  Job:  [id number]  Electronically Signed by Marinda Elk M.D. on 01/17/2011 05:10:03 PM

## 2011-01-18 LAB — CROSSMATCH
ABO/RH(D): O POS
Antibody Screen: NEGATIVE
Unit division: 0

## 2011-01-23 NOTE — Discharge Summary (Signed)
[name], [place]             ACCOUNT NO.:  [id number]  MEDICAL RECORD NO.:  [id number]           PATIENT TYPE:  I  LOCATION:  3743                         FACILITY:  [facility]  PHYSICIAN:  Rock Nephew, MD       DATE OF BIRTH:  [birth year]  DATE OF ADMISSION:  01/08/2011 DATE OF DISCHARGE:                        DISCHARGE SUMMARY - REFERRING   PRIMARY CARE PHYSICIAN:  Robert L. Foy Guadalajara, MD  DISCHARGE DIAGNOSES: 1. Probable iron-deficiency anemia, etiology is not clear, any     outpatient followup with Hematology. 2. Right knee gout, on prednisone. 3. Escherichia coli urinary tract infection, on nitrofurantoin until     January 19, 2011. 4. History of coronary artery disease and coronary artery bypass     graft. 5. Gastroesophageal reflux disease, on PPI. 6. Acute renal failure, resolved. 7. Uncontrolled hypertension, improved. 8. Possible pneumonia on Levaquin.  DISCHARGE MEDICATIONS:  Will be dictated at the time of discharge; however, the patient should be on nitrofurantoin and Levaquin until January 19, 2011.  The patient should be on a short course of prednisone for about 5 days for gout.  The patient should also be on iron.  DIET:  Should be carbohydrate-modified.  The patient carries a diagnosis of diabetes.  The patient's hemoglobin A1c was 5.7, so she may have prediabetes.  FOLLOWUP:  The patient should follow up with Dr. Foy Guadalajara in about 1 week. The patient should also have about regular CBCs weekly until the patient's blood count stabilizes and again the patient should have evaluation by Hematology for anemia.  CONSULTATIONS:  As discussed above.  PROCEDURES PERFORMED:  The patient had a chest x-ray, which showed no evidence of cardioactive pulmonary disease.  Right femur x-ray with degenerative changes of the hip and knee without osseous abnormality. The patient had right tibia-fibula, which showed degenerative changes. 1. The patient had a CT scan of abdomen and  pelvis, which showed     diffuse hepatic steatosis to 7 mm hypodense lesion suspected in the     right hepatic lobe, statistically likely to be benign but too small     to characterize. 2. Prominent atherosclerosis.  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     However, no bowel wall thickening is identified to suggest     mesenteric ischemia, and the celiac trunk in IMA appear patent. 3. Mildly enlarged right common iliac nodes of uncertain significance. 4. Lumbar spondylosis with degenerative lower lumbar subluxation,     cardiomegaly.  The patient also had chest x-ray on January 10, 2011,     which showed cardiac enlargement as before what was apparent     increased in pulmonary vascularity suggesting early congestive     change or fluid overload.  Right knee x-ray with severe     degenerative changes and right knee with joint effusion.  No acute     osseous abnormality identified.  The patient's renal ultrasound on     January 11, 2011, showed no hydronephrosis.  Mild bilateral renal     parenchymal thinning.  The patient also had  upper endoscopy done by     Dr. Ewing Schlein, which showed small hiatal hernia, minimal gastritis,     antritis, otherwise normal EGD without any signs of bleeding. 5. The patient had a colonoscopy by Dr. Vida Rigger on January 14, 2011,     which showed internal-external small hemorrhoids.  Two long looping     tortuous colon.  Three tiny proximal descending polyp, not biopsied     otherwise within normal limits to determine ileum without any blood     being seen.  No further GI workup.  BRIEF HISTORY OF PRESENT ILLNESS:  This is a 77 year old [place] woman who came to the hospital for chest pain and profound anemia.  The patient had a hemoglobin of 6.6 with MCV of 72 in the emergency department.  HOSPITAL COURSE: 1. Probable iron-deficiency anemia, guaiac-positive stools.  The     patient was admitted  to the hospital.  The patient was transfused 2     units of packed red blood cells.  The patient's hemoglobin went up     3 units with 2 units of blood cells.  The patient then has slowly     over the past week, hemoglobin has come down to 8.6.  The patient's     aspirin and Plavix were stopped.  The patient's EGD and colonoscopy     were unremarkable to account for the bleeding.  I will start the     patient back on her iron.  Before leaving the hospital, I will give     the patient 2 more units of packed red blood cells.  Again, the     patient should have weekly CBCs.  The patient needs an outpatient     evaluation by Hematology. 2. Right knee gout.  The patient has right knee gout and the patient     was started on prednisone.  For the right knee pain, initially     there was a concern for septic arthritis.  We have consulted     Orthopedics.  Orthopedics tapped the effusion in the right knee,     which had 16,000 white cells.  Culture was negative.  This is most     likely gout was causing the severe right knee pain.  The patient     will be on prednisone for 5 more days. 3. Urinary tract infection.  The patient was having high fevers.  She     was discovered to have urinary tract infection.  The patient's     microbiology culture from the urinary tract infection came back     greater than 100,000 colonies of E. coli.  The E. coli was     resistant to Cipro; however, the patient has a penicillin allergy.     The penicillin fairly cause some swelling and itching, as a result,     we placed the patient on nitrofurantoin to complete a 7-day course. 4. The patient had history of coronary artery disease, CABG.     Currently with anemia, the patient's aspirin and Plavix were     withheld.  I have started the patient back on Plavix.  I have not     started the patient back on aspirin yet.  The patient's     cardiologist is Dr. Tresa Endo. 5. Gastroesophageal reflux disease.  The patient is on a  PPI. 6. Acute renal failure.  The patient did have some acute renal     failure, which has since  resolved.  The patient's creatinine peaked     at 1.54, currently the creatinine is 0.88. 7. Hypertension.  The patient was noted to develop some hypertension     on January 15, 2011 and late evening of January 14, 2011.  I noticed     that the patient's torsemide has not been continued.  This could be     related to fluid overload.  I started the patient back on     torsemide.  If the blood pressure is still high, the patient may     need another antihypertensive regimen. 8. Possible pneumonia, also the patient was coughing some sputum and     it was thought that the patient may have had some possible     pneumonia.  The patient was empirically treated with Levaquin. 9. The patient's disposition is most likely going to be SNF.  I spoke     to the patient's main decision-making     daughter.  She told me that on January 14, 2011 that we will wait     until January 15, 2011.  If the patient is not able to transfer from     bed to her wheelchair by herself, then the patient's family will     agree to the SNF.  Currently, we are hoping for SNF.  The patient's     family desires SNF.     Rock Nephew, MD     NH/MEDQ  D:  01/15/2011  T:  01/15/2011  Job:  [id number]  cc:   Molly Maduro L. Foy Guadalajara, M.D. Nicki Guadalajara, M.D. Petra Kuba, M.D. Madlyn Frankel Charlann Boxer, M.D.  Electronically Signed by Rock Nephew MD on 01/23/2011 09:10:45 PM

## 2011-01-26 NOTE — H&P (Signed)
[name], [name]             ACCOUNT NO.:  [id number]  MEDICAL RECORD NO.:  [id number]           PATIENT TYPE:  E  LOCATION:  [facility]                         FACILITY:  [facility]  PHYSICIAN:  Houston Siren, MD           DATE OF BIRTH:  1931/06/03  DATE OF ADMISSION:  01/08/2011 DATE OF DISCHARGE:                             HISTORY & PHYSICAL   PRIMARY CARE PHYSICIAN:  Robert L. Foy Guadalajara, MD  ADVANCE DIRECTIVES:  Full code.  REASON FOR ADMISSION:  Chest pain and profound anemia.  HISTORY OF PRESENT ILLNESS:  This is a 77 year old [place]ian woman with known coronary artery disease status post prior bypass, history of iron deficiency anemia, GERD, prior MI and CVA, congestive heart failure presents to the emergency room with chest pain and bilateral pedal edema.  The chest pain happened tonight, and it was substernal without nausea, vomiting or diaphoresis.  The pedal edema has been worsening for the past week.  She was given aspirin and nitroglycerin with resolution of her chest pain.  Workup in emergency room included a hemoglobin of 6.6 with MCV of 72 and red blood cell index of 3.02.  I noted that she has a normal white count of 9.3,000 and normal platelet count of 266,000.  Her cardiac markers were negative and her EKG showed no acute ST-T changes.  She reportedly had an EGD and a colonoscopy in 2008 and was told that they were  negative, although I did not see any result from e-chart. Her creatinine is normal and she is guaiac positive.  She denied any nonsteroidal or alcohol use.  She maintained her stable hemodynamics.  Hospitalist was asked to admit the patient for transfusion, to rule out, and to further evaluate her anemia.  PAST MEDICAL HISTORY:  As above.  FAMILY HISTORY:  Noncontributory.  REVIEW OF SYSTEMS:  Otherwise unremarkable.  She appears comfortable.  PHYSICAL EXAMINATION:  VITAL SIGNS:  Blood pressure 125/47, pulse of 90, respiratory rate of 24, temperature  98. GENERAL:  Shows she is alert and oriented and conversing.  Her speech is fluent and she is being translated to her daughter. HEENT:  Sclerae are nonicteric and subconjunctival was quite pale. Throat is clear.  Tongue is midline.  No carotid bruit. CARDIAC:  S1 and S2 with a soft 2/6 systolic ejection murmur at the left sternal border. LUNGS:  Clear without any wheezes, rales or any evidence of consolidation. ABDOMEN:  Soft, nondistended, nontender, obese. EXTREMITIES:  Edema 2+.  No calf tenderness.  She does have adequate distal pulses bilaterally. SKIN:  Warm and dry.  No asterixis.  There are dermatographic lines on her hands, is pale compared to the surrounding skin color. PSYCHIATRIC:  Unremarkable as well.  OBJECTIVE FINDINGS:  Chest x-ray shows cardiac enlargement.  Serum sodium 131, potassium 4.3, creatinine 1.2.  Liver function tests are normal.  Troponin less than 0.05, white count of 9.3,000, hemoglobin of 6.6, MCV of 72, platelet count 266,000.  IMPRESSION:  This is a 77 year old female with known coronary artery disease presented with chest pain and severe anemia.  This is angina precipitated from  her severe anemia.  She will need blood.  I will give her 20 mg of IV Lasix before each unit of blood as she is at risk for congestive heart failure.  She will likely need 4 units of packed red blood cells in total.  We will give 2 first and then recheck her H and H.  I will cycle her cardiac markers as well.  After transfusion, she would likely benefit from further workup.  To start, I will get iron studies along with hemoglobin electrophoresis.  We will also get a reticulocyte count as well.  It is possible that she has angio dysplasia as she has a slow GI bleed undetectable with endoscopy.  She should also be checked for small bowel portion as well, not just upper and lower endoscopy.  She is stable and will receive blood.  We will admit her to [facility] hospitalist [facility].   She is a full code.     Houston Siren, MD     PL/MEDQ  D:  01/08/2011  T:  01/08/2011  Job:  [id number]  Electronically Signed by Houston Siren  on 01/26/2011 04:35:10 AM

## 2011-01-26 NOTE — Consult Note (Signed)
[name], [name]             ACCOUNT NO.:  [id number]  MEDICAL RECORD NO.:  [id number]           PATIENT TYPE:  I  LOCATION:  3743                         FACILITY:  [facility]  PHYSICIAN:  [name], M.D.     DATE OF BIRTH:  09-May-1931  DATE OF CONSULTATION:  01/09/2011 DATE OF DISCHARGE:                                CONSULTATION   HISTORY OF PRESENT ILLNESS:  [name] is a 77 year old female from [place], who has a history of coronary disease.  She apparently had a remote catheterization in 1993.  She had bypass grafting in June 2000 with an LIMA to the LAD, left radial artery at the OM, SVG to the PDA, and SVG to the diagonal.  She had a catheterization in January 2006, for chest pain and abnormal Myoview.  She did suffer a neurologic event post catheterization that ultimately resolved.  She underwent SVG to PDA Cypher stenting in February 2006 without complication.  Her last Myoview was Feb 01, 2010, which showed diaphragmatic attenuation, but otherwise normal perfusion with an EF of 60%.  Echocardiogram done on May 2011, showed an EF of 45% to 50% with diastolic dysfunction and aortic valve sclerosis and mild MR.  She has had a problem with anemia in the past, she has had a previous workup in 2006, that apparently did not show any significant ulcer disease.  She was admitted by the hospitalist service January 08, 2011, with chest pain.  She was found to be profoundly anemic with a hemoglobin of 6.6 on admission.  Her EKG shows no acute changes and her cardiac markers were negative.  She was admitted and transfused. She had been seen in consult by Dr. Vida Rigger.  She did have a guaiac- positive stool.  He wants to proceed with a colonoscopy, but wants cardiac clearance prior to this.  The patient continues to have chest pain, but when I interviewed her this afternoon, it sounds localized, left-sided, and pleuritic.  PAST MEDICAL HISTORY:  Remarkable for; 1. Vascular  disease, she has known moderate internal carotid disease     that is followed medically. 2. She has treated hypertension. 3. She has had past esophageal reflux. 4. She has been treated for iron-deficiency anemia. 5. She has treated dyslipidemia and non-insulin dependent diabetes.  HOME MEDICATIONS: 1. Primidone 50 mg q.p.m. 2. Plavix 75 mg a day. 3. Folic acid 1 mg daily. 4. Niferex 150 mg a day. 5. Imdur XR 30 mg a day. 6. Metoprolol one and half tablets in the morning and 1 tablet in the     evening, 50 mg. 7. Aspirin 81 mg a day. 8. Vytorin 10/20 daily. 9. Metformin XR 500 mg twice a day. 10.Torsemide 20 mg a day. 11.Nexium 40 mg a day. 12.Aldactone 25 mg a day.  ALLERGIES:  She is allergic to PENICILLIN.  SOCIAL HISTORY:  She is a nonsmoker.  She is married, has 6 children, and 10 grandchildren.  She does not drink alcohol.  FAMILY HISTORY:  Unremarkable.  REVIEW OF SYSTEMS:  Essentially unremarkable, except for noted above. Her last office visit with Dr. Tresa Endo was January 2012.  He notes overall she was doing well, she is fairly sedentary.  PHYSICAL EXAMINATION:  VITAL SIGNS;  Blood pressure 148/78, pulse 72, and temp 99.5. GENERAL:  She is a pale and obese elderly female, in no acute distress. HEENT:  Normocephalic.  Extraocular movements are intact.  Sclerae are anicteric. NECK:  Reveals no JVD. CHEST:  Clear to auscultation and percussion without wheezing, although she has diminished breath sounds and poor effort. CARDIAC:  Reveals regular rate and rhythm with 1/6 systolic murmur at the left sternal border. ABDOMEN:  Obese and nontender. EXTREMITIES:  Without obvious edema. NEUROLOGIC:  Grossly intact.  She is awake, alert, oriented, and co- operative, moves all extremities without obvious deficit.  LABORATORY DATA:  EKG shows sinus rhythm, nonspecific ST changes. Troponins are negative x5.  Sodium 139, potassium 4.3, chloride 103, CO2 of 28, BUN 18, and  creatinine 0.93.  White count 8.4, hemoglobin today 9.6 with a hematocrit of 30.7, and platelets 242.  Stool was guaiac positive x1.  TSH is 1.15.  Chest x-ray shows no active disease.  IMPRESSION: 1. Unstable angina, myocardial infarction ruled out. 2. Significant anemia with hemoglobin of 6.6 on admission with a     positive stool, transfused to 9.6. 3. History of coronary artery disease with coronary artery bypass     grafting x4 June 2000 with an SVG to PDA Cypher stent, February     2006. 4. Postcatheterization transient neurologic deficit, January 2006. 5. Known moderate internal carotid disease. 6. Mild left ventricular dysfunction with aortic valve sclerosis by     echocardiogram, May 2011. 7. Treated hypertension. 8. History of gastroesophageal reflux. 9. History of anemia, previously evaluated in 2006. 10. Type 2 NIDDM  PLAN:  The patient will be seen by Dr. Tresa Endo, further recommendations to follow, pending his evaluation, review of records.  The patient will be seen by Dr. Tresa Endo for clearance for colonoscopy and further recommendations.  At this time, her chest pain does not sound typical for  Dictation ended at this point.     [name], P.A.   ______________________________ [name], M.D.    [name]  D:  01/09/2011  T:  01/09/2011  Job:  [id number]  cc:   [name], M.D. [name], M.D.  Electronically Signed by [name] P.A. on 01/09/2011 03:45:18 PM Electronically Signed by [name] M.D. on 01/26/2011 02:28:12 PM

## 2011-01-29 ENCOUNTER — Encounter (HOSPITAL_BASED_OUTPATIENT_CLINIC_OR_DEPARTMENT_OTHER): Payer: Medicare Other | Admitting: Oncology

## 2011-01-29 ENCOUNTER — Encounter: Payer: Medicare Other | Admitting: Oncology

## 2011-01-29 ENCOUNTER — Other Ambulatory Visit: Payer: Self-pay | Admitting: Oncology

## 2011-01-29 DIAGNOSIS — D649 Anemia, unspecified: Secondary | ICD-10-CM

## 2011-01-29 DIAGNOSIS — D509 Iron deficiency anemia, unspecified: Secondary | ICD-10-CM

## 2011-01-29 LAB — CBC WITH DIFFERENTIAL/PLATELET
Basophils Absolute: 0 10*3/uL (ref 0.0–0.1)
Eosinophils Absolute: 0 10*3/uL (ref 0.0–0.5)
HGB: 12 g/dL (ref 11.6–15.9)
MCV: 77.2 fL — ABNORMAL LOW (ref 79.5–101.0)
MONO%: 4.6 % (ref 0.0–14.0)
NEUT#: 7.4 10*3/uL — ABNORMAL HIGH (ref 1.5–6.5)
RDW: 23.9 % — ABNORMAL HIGH (ref 11.2–14.5)
lymph#: 0.7 10*3/uL — ABNORMAL LOW (ref 0.9–3.3)

## 2011-01-30 LAB — IRON AND TIBC
%SAT: 24 % (ref 20–55)
Iron: 65 ug/dL (ref 42–145)
TIBC: 270 ug/dL (ref 250–470)
UIBC: 205 ug/dL

## 2011-01-30 LAB — FERRITIN: Ferritin: 214 ng/mL (ref 10–291)

## 2011-02-06 NOTE — Discharge Summary (Signed)
[name], [name]             ACCOUNT NO.:  [id number]   MEDICAL RECORD NO.:  [id number]          PATIENT TYPE:  INP   LOCATION:  3711                         FACILITY:  [facility]   PHYSICIAN:  Hollice Espy, M.D.DATE OF BIRTH:  06/23/1931   DATE OF ADMISSION:  04/11/2009  DATE OF DISCHARGE:                               DISCHARGE SUMMARY   PRIMARY CARE PHYSICIAN:  Dr. Sid [name]   DISCHARGE DIAGNOSES:  1. Syncope felt to be secondary to number 2.  2. Orthostatic hypotension felt to be secondary to number 3.  3. Acute renal failure now resolved secondary to number 4.  4. Urinary tract infection now treated.  5. Fall secondary to #1.  6. Right right fifth toe proximal fracture, closed secondary to fall.  7. Morbid obesity.  8. Muscle weakness.  9. History of gout.  10.History of hypertension.  11.History of osteoarthrosis.  12.History of iron-deficiency anemia.  13.History of hypertension.  14.Diabetes mellitus type 2, new diagnosis to be diet controlled.   DISCHARGE MEDICATIONS:  1. Folate one 1 mg p.o. day.  2. Imdur 20 mg p.o. t.i.d.  3. Iron 150 p.o. b.i.d.  4. Spironolactone 12.5 p.o. b.i.d.  5. Aspirin 81 p.o. daily.  6. Nexium 40 p.o. daily.  7. Plavix 75 p.o. daily.  8. Metoprolol.  Previously she was on 75 mg p.o. b.i.d.  This is being      decreased down to 25 mg p.o. b.i.d.  It may be increased  after she      follows up with her PCP.  9. Vytorin 10/40 p.o. daily.  10.Her Demadex  60 mg p.o. daily and losartan 100 p.o. daily are      currently on hold secondary to hypotension.  These medications may      again be resumed when she follows up with her PCP.  11.Tylenol 650 p.o. q.6 h. p.r.n.   HOSPITAL COURSE:  The patient is a 77 year old Arabic female who speaks  no English who passed out and was brought into the [facility]  Emergency Room.  There she was evaluated and found to have urinary tract  infection and acute renal failure and dehydration.   The patient's family  requested the patient be transferred over to [facility] system  where the patient's PCP is involved in her care.  The patient was then  transferred over.  When the patient  arrived as a direct transfer, she  was evaluated and she was felt to have a urinary tract infection causing  acute renal failure, dehydration, orthostatic hypotension and resultant  syncope.  This was treated with IV fluids, IV Cipro and the patient  completed a full 3-day course of this.  She remained stable and is  afebrile and felt not to warrant any further outpatient treatment with  antibiotics.  She was complaining of some foot pain.  An x-ray as well  as a serum uric acid level was checked.  Serum uric acid  level was  normal but the x-ray noted a small fracture of the fifth proximal  metatarsal on the right foot.  The patient's toe was taped to the fourth  toe and she has been receiving PT, although for the most part she is  wheelchair bound.  As the patient's blood pressure improved her  medicines were restarted including the spironolactone. Fluids were  discontinued and the metoprolol was started at a lower dose of 25 p.o.  b.i.d.  The plan will be for the patient to resume __________, losartan,  spironolactone and increase metoprolol as her pressure increases when  she followed up with PCP.  The patient is overall feeling much more as  her baseline, however, the patient is concerned  because the patient is  again wheelchair-bound and is still very weak. They would like the  patient go to a short-term skilled nursing facility for physical therapy  and rehab.  Bed offers have been sent out and likely the patient will  hopefully be able to go to short-term skilled nursing on Friday, July  23.  The patient will follow up with her PCP, Dr. Sid [name] in 1-2 weeks'  time after discharge from rehab.   DISPOSITION:  Improved.   ACTIVITY:  Skilled nursing, PT, OT.   DISCHARGE DIET:   Low-sodium, carb modified diet.  The patient and her  family have received information about a diabetic diet.   Note:  On routine screening the patient's A1c came back elevated at 6.5  consistent with a new diagnosis of diabetes.  Her average  blood sugar  was 140 and I  discussed this with the family.  We agreed that the best  for now will be to try to better modify her diet.     She will be discharged to skilled nursing.      Hollice Espy, M.D.  Electronically Signed     SKK/MEDQ  D:  04/14/2009  T:  04/14/2009  Job:  [id number]   cc:   Dr. Sid [name]

## 2011-02-06 NOTE — H&P (Signed)
[name], [name]             ACCOUNT NO.:  [id number]   MEDICAL RECORD NO.:  [id number]          PATIENT TYPE:  INP   LOCATION:  3711                         FACILITY:  [facility]   PHYSICIAN:  Ramiro Harvest, MD    DATE OF BIRTH:  08/16/1931   DATE OF ADMISSION:  04/11/2009  DATE OF DISCHARGE:                              HISTORY & PHYSICAL   ATTENDING PHYSICIAN:  Dr. Ramiro Harvest.   PRIMARY CARE PHYSICIAN:  Robert L. Foy Guadalajara, M.D. [facility].   CARDIOLOGIST:  Dr. Tresa Endo   HISTORY OF PRESENT ILLNESS:  [name] is a 77 year old  [place] [name], non-English speaking, with a history of coronary  artery disease, status post cabbage in 2006, history of hypertension,  hyperlipidemia, CVA with right residual weakness, GERD, who presented  today at [facility] Emergency Department with a syncopal episode.  History was obtained from the daughter, who states that the patient was  taking a shower.  When she passed out for approximately 10 minutes, the  patient's daughter called her prior to fall, in such the patient did not  fall.  Water was poured on the patient's face, and she open her eyes  slowly.  EMS was called. Oxygen was placed, and the patient improved  slowly.  The patient and family deny any shortness of breath, no tongue-  biting, no nausea, no vomiting, no diarrhea, no constipation, no  abdominal pain, no dysuria.  The patient had been complaining of some  intermittent substernal chest pain ongoing x1 week.  The patient is  unable to describe the chest pain and give any further details on it.  The patient has also had increase in her tremors and some decreased oral  intake.  The patient was sent to the [facility] ED, where she was found  to be orthostatic and also with a urinary tract infection.  Sodium  obtained that was 128, potassium of 5.7, creatinine of 1.26.  Chest x-  ray was negative.  Head CT was negative.  The patient's family had  requested  transfer to [facility] and as such, the patient was  transferred here.   ALLERGIES:  PENICILLIN causes a rash and welts.   PAST MEDICAL HISTORY:  1. Hypertension.  2. GERD.  3. Hyperlipidemia.  4. CVA in 2006 with right residual weakness.  5. Coronary artery disease, status post CABG.   HOME MEDICATIONS:  Plavix 75 mg p.o. daily, Vytorin 06/1937 p.o. daily,  Nexium 40 mg p.o. daily, Nu-Iron 150 mg p.o. b.i.d., Losartan 100 mg  p.o. daily, torsemide 60 mg p.o. daily, metoprolol 75 mg p.o. b.i.d.,  spironolactone 12.5 mg p.o. b.i.d., folic acid 1 mg p.o. daily,  isosorbide mononitrate p.o. t.i.d., Ecotrin 81 mg p.o. daily, Tylenol  500 mg 2 tablets p.o. q.h.s.   SOCIAL HISTORY:  The patient is married.  She is [place], originally  from [place].  No tobacco use.  No alcohol use.  No IV drug use.   FAMILY HISTORY:  Father deceased, age 64, cause unknown.  Mother  deceased age 58, cause unknown.  Four brothers, 3 deceased.  One from  diabetes, one from respiratory issues, one from an acute MI.  Three  sisters alive, 1 with diabetes.   REVIEW OF SYSTEMS:  As per HPI, otherwise negative.   PHYSICAL EXAMINATION:  Temperature 97.7, pulse of 92, respirations 18,  blood pressure 116/65, satting 100% on 2 liters nasal cannula.  Orthostatics obtained in the outside hospital:  Lying 126/56 with a  pulse of 83, sitting 119/83 with a pulse of 83, standing 89/48 with a  pulse of 85.  GENERAL:  Patient lying in bed in no apparent distress.  HEENT: Normocephalic, atraumatic.  Pupils equal round reactive light and  accommodation.  Extraocular movements intact.  Oropharynx is clear.  No  lesions, no exudates.  NECK:  Supple.  No lymphadenopathy.  RESPIRATORY:  Lungs are clear to auscultation bilaterally.  No wheezes,  no crackles.  No rhonchi.  CARDIOVASCULAR:  Regular rate and rhythm.  No murmurs, rubs or gallops.  ABDOMEN: Soft, nontender, nondistended.  Positive bowel sounds.  Obese.   EXTREMITIES: No clubbing, cyanosis or edema.  NEUROLOGIC:  The patient is alert and oriented x3.  Cranial nerves II-  XII are grossly intact.  The patient refusing any further neurological  exam.   Labs obtained at the outside hospital:  CBC:  White count 11.0,  hemoglobin 10.4, platelets of 212, hematocrit 31.6.  Sodium of 128,  potassium 5.7, chloride 99, bicarb 24, BUN 39, creatinine 1.26, glucose  of 134, calcium of 9.1, bilirubin 0.5, albumin 3.6, protein 7.0, AST 25,  ALT 14, alk phosphatase 98.  CBG of 142, CK of 64, CK-MB 2.2, troponin I  0.04, PT of 13.8, INR of 1.0.   Chest x-ray shows no acute cardiopulmonary disease.   CT of the head shows moderate diffuse atrophy, no acute abnormalities.   Urinalysis.  Urinalysis was yellow, cloudy, specific gravity 1.014, pH  of 6, glucose negative, hemoglobin negative, bilirubin negative, ketones  negative, protein negative urobilinogen 0.2, nitrite positive, leukocyte  esterase moderate WBCs 3 to 6, bacteria few, urine epithelial cells were  rare.   ASSESSMENT AND PLAN:  1. Syncope, likely secondary to orthostasis in the setting of diuretic      use and hypovolemia versus cardiac versus neurological.  Admit the      patient to telemetry.  Check a comprehensive metabolic profile.      Cycle cardiac enzymes q.8h. x3.  Check a TSH.  Check a CBC with      diff.  Check coags.  Check UA with cultures and sensitivities.  CT      of the head obtained at the outside hospital was negative for acute      infarct.  Place on IV fluids daily.  Orthostatics and follow.  If      no improvement, may consider an EEG for further evaluation to rule      out seizures versus MRI as well.  Orthostasis likely secondary to      volume depletion in the setting of antihypertensive use.  Will      place on IV fluids and hold blood pressure medicines.  2. Hyponatremia, likely secondary to hypovolemic hyponatremia in the      setting of diuretic use.  The  patient is orthostatic.  Hold blood      pressure medications and diuretics for now.  Check a fractional      excretion of sodium.  Hydrate with IV fluids and follow.  3. Hyperkalemia:  Recheck a CMET.  If still elevated, will  give      Kayexalate.  4. Hypertension:  Hold blood pressure medications for now secondary to      problem #2.  5. Gastroesophageal reflux disease:  Protonix.  6. Hyperlipidemia:  Check a fasting lipid panel.  Continue home-dose      Vytorin.  7. History of cerebrovascular accident with right residual weakness:      Plavix, risk factor modification PT/OT.  8. Anemia:  Check an anemia panel and guaiac stools.  Follow H&H.  9. Coronary artery disease, status post coronary artery bypass graft:      Cycle cardiac enzymes q.8h. x3.  Check an      EKG.  Hold medications secondary to a problem #1 and orthostasis.      Follow.  10.Urinary tract infection:  Check a urine culture.  Place on IV      ciprofloxacin.  11.Prophylaxis:  Nexium for gastrointestinal prophylaxis.  Lovenox for      deep venous thrombosis prophylaxis.      Ramiro Harvest, MD  Electronically Signed     DT/MEDQ  D:  04/12/2009  T:  04/12/2009  Job:  [id number]   cc:   Molly Maduro L. Foy Guadalajara, M.D.  Nicki Guadalajara, M.D.

## 2011-02-09 NOTE — Consult Note (Signed)
NAMEOPAL, DINNING             ACCOUNT NO.:  [id number]   MEDICAL RECORD NO.:  [id number]          PATIENT TYPE:  EMS   LOCATION:  MAJO                         FACILITY:  [facility]   PHYSICIAN:  Deanna Artis. Hickling, M.D.DATE OF BIRTH:  08/16/1931   DATE OF CONSULTATION:  09/26/2004  DATE OF DISCHARGE:                                   CONSULTATION   CHIEF COMPLAINT:  Right-sided weakness and aphagia which has resolved.   HISTORY OF THE PRESENT CONDITION:  A 77 year old right-handed Arabic woman  from [place] had coronary angiogram at 77 a.m. and was discharged home at 12  noon.  The study went well.  She had severe ischemic disease of her bypass  grafts and plans were made for her to come back for stenting of at least one  of the grafts on Friday.   The patient did well until around 5 p.m.  She had difficulty eating,  drinking and ambulating.  She fell to the right.  She also made no sense and  neither was able to understand what her family was saying nor to express  herself.  She presented to the [facility] Emergency Room at 8:45.  Code  stroke was called at 8:54.  CT scan brain 9:35.  Interpreted by me 9:40  showing atrophy and no acute disease.  During my assessment, the patient  normalized in the emergency room.   PAST MEDICAL HISTORY:  1.  Gastroesophageal reflux disease.  2.  Hypertension.  3.  Congestive heart failure.  4.  Coronary artery disease.   PAST SURGICAL HISTORY:  Coronary artery bypass graft x4 in 2000.   MEDICATIONS:  1.  Reglan 10 mg q.i.d.  2.  Aspirin 325 mg daily.  3.  Nexium 40 mg daily.  4.  Demodex 20 mg b.i.d.  5.  Plavix 75 mg daily.  6.  Potassium chloride 10 mEq daily.  7.  Metoprolol tartrate 75 mg b.i.d.  8.  Imdur 60 mg daily.  9.  In addition, Dr. Tresa Endo states that she is also on, that the Lotrel is on      hold.  10. She takes Tramadol  as needed.  11. Folic acid.  12. Vytorin 10/40.  13. Norvasc 5 mg.   DRUG ALLERGIES:   Penicillin.   FAMILY HISTORY:  Not known to me.   SOCIAL HISTORY:  She lives with her daughter.  Home is in [place].  She speaks no [place].  She does not use tobacco or alcohol.   PHYSICAL EXAMINATION:  VITAL SIGNS:  Blood pressure 110/42, resting pulse  92, respirations 24, temperature 99.7.  Pulse oximetry 94%.  HEENT:  Ears, nose and throat:  No signs of infection.  LUNGS:  Clear.  HEART:  No murmurs.  Pulse is normal.  ABDOMEN:  Protuberant.  Bowel sounds normal.  No hepatosplenomegaly.  EXTREMITIES:  Tender.  She has arthritis.  I did not palpate any cords.  NEUROLOGICAL:  Awake and alert.  The family translates.  She was able to  name objects, follow commands, repeat phrases.  Visual fields are full.  She  has a right central seventh versus a facial asymmetry.  Midline tongue.  Motor examination:  Normal strength in her arms.  In her legs, she has give-  way strength.  Fine motor movements:  No drift.  Fine motor movements are  present and normal.  There was no drift.  Sensory examination intact to  primary and cortical modalities.  Cerebellar examination:  Good finger-to-  nose.  Rapid repetitive movements.  Gait not tested.  Deep tendon reflexes  are normal in the upper extremities and brisk.  Diminished to absent in the  lower extremities.  Bilateral flexor plantar responses.   IMPRESSION:  I believe the patient had a transient ischemic attack that came  as a complication of the cardiac catheterization, 435.8.   RECOMMENDATIONS:  MRI, brain MRA intracranial, carotid Doppler, transcranial  Doppler, 2D echocardiogram will be obtained, will be brought from  [facility] for evaluation.  She will be on  aspirin and Plavix.      Will   WHH/MEDQ  D:  09/26/2004  T:  09/27/2004  Job:  [id number]   cc:   Nicki Guadalajara, M.D.  [phone], [address]  [place], [place] [address]  Fax: [phone]

## 2011-02-09 NOTE — Discharge Summary (Signed)
[name], [name]             ACCOUNT NO.:  [id number]   MEDICAL RECORD NO.:  [id number]          PATIENT TYPE:  OIB   LOCATION:  2013                         FACILITY:  [facility]   PHYSICIAN:  Nicki Guadalajara, M.D.     DATE OF BIRTH:  08/16/1931   DATE OF ADMISSION:  10/27/2004  DATE OF DISCHARGE:  10/31/2004                                 DISCHARGE SUMMARY   ADMISSION DIAGNOSES:  1.  Unstable angina.  2.  Significant coronary artery disease at the time of diagnostic      catheterization, which was performed at the [facility] on September 26, 2004, needing intervention.  3.  Status post atheroembolic cerebrovascular accident.  4.  Status post cardiac catheterization on September 26, 2004.  This was a left      subcortical infarction.  5.  History of coronary artery disease.      1.  History of coronary artery bypass graft in 2000.      2.  Status post diagnostic cardiac catheterization on September 26, 2004,          revealing progression of disease.  6.  Normal left ventricular function.  7.  Peripheral vascular disease with intracerebral stenosis per MRA.  She      also has bilateral moderate diffuse disease of carotid arteries.  These      were about 40-60% at the last exam.  8.  Anemia.      1.  She underwent consultation by Dr. Matthias Hughs and had an EGD performed          during that admission, September 26, 2004.  This did not show the          source of the GI bleed.  Dr. Matthias Hughs thought that it would be          appropriate for barium enema as an outpatient, and, if primary care          felt this to be necessary.  He also questioned whether she had          Mediterranean hemoglobinopathy or chronic anemia.  9.  Hypertension.  10. Gastroesophageal reflux disease.   DISCHARGE DIAGNOSES:  1.  Unstable angina.  2.  Significant coronary artery disease at the time of diagnostic      catheterization, which was performed at the [facility] on September 26, 2004, needing  intervention.  3.  Status post atheroembolic cerebrovascular accident.  4.  Status post cardiac catheterization on September 26, 2004.  This was a left      subcortical infarction.  5.  History of coronary artery disease.      1.  History of coronary artery bypass graft in 2000.      2.  Status post diagnostic cardiac catheterization on September 26, 2004,          revealing progression of disease.  6.  Normal left ventricular function.  7.  Peripheral vascular disease with intracerebral stenosis per MRA.  She      also has bilateral moderate diffuse disease  of carotid arteries.  These      were about 40-60% at the last exam.  8.  Anemia.      1.  She underwent consultation by Dr. Matthias Hughs and had an EGD performed          during that admission, September 26, 2004.  This did not show the          source of the GI bleed.  Dr. Matthias Hughs thought that it would be          appropriate for barium enema as an outpatient, and, if primary care          felt this to be necessary.  He also questioned whether she had          Mediterranean hemoglobinopathy or chronic anemia.  9.  Hypertension.  10. Gastroesophageal reflux disease.  11. Status post repeat catheterization and intervention by Dr. Tresa Endo on      October 27, 2004.  Performed intervention to the SVG to RCA.  See his      dictated report for all details.  12. Recurrent anemia this admission.  She was transfused packed red blood      cells.  She had already had a recent GI evaluation as above.   HISTORY OF PRESENT ILLNESS:  [name] is a 77 year old female, originally  from [place].  She has a history of CAD with initial catheterization back  in 1993.  In 6/00, she underwent CABG.  She also has a history of  hypertension and hyperlipidemia.  She recently had a Cardiolite scan which  suggested new ischemia in the inferior and anterolateral wall.  Therefore  repeat catheterization was performed on September 26, 2004.  This revealed  significant CAD.   See the report for details.  She was started on Plavix.  The initial plan was to perform PCI of the SVG several days later following  Plavix and continued hydration.  However, 12 hours after the catheterization  study, the patient presented to the [facility] Emergency Room with symptoms  suggestive of embolic CVA.  Subsequently the PCI was deferred.  As well, she  was evaluated by Dr. Matthias Hughs for anemia and had an EGD.   She presented to the office on October 25, 2004, to follow up with Dr.  Tresa Endo.  She had not had any chest pain that day, but had some chest pain on  Saturday and Sunday.   At that time, Dr. Tresa Endo reviewed her cath findings.  At that point, she  seemed to have significantly improved neurologically.  She had been on  aspirin and Plavix.  He titrated up Imdur to 60 mg.  He planned to proceed  with percutaneous intervention to the SVG to RCA on 10/27/04.  The risks and  benefits of the procedure were discussed, and she and her family were  agreeable to proceed.   HOSPITAL COURSE:  On October 27, 2004, she underwent cardiac catheterization  intervention by Dr. Daphene Jaeger.  Please see his dictated report for details.  He intervened on the SVG to RCA.  He has a very lengthy note.  See that for  details.  She tolerated this without complications.   On the following morning, October 28, 2004, she was without chest pain.  She  was hemodynamically stable.  Labs were stable.  However, her hemoglobin was  down to 8.2 with a hematocrit of 25.2.  He reviewed that she had just had  anemic workup recently.  He planned to recheck stools, transfuse red blood  cells.   On October 29, 2004, hemoglobin was up to 10.3, hematocrit 31.1.  She was  still without chest pain and remained hemodynamically stable.  At that  point, he planned to continue Integrilin until that night and then would stop Integrilin.  He planned to ambulate and felt that she possibly could go  home in the morning if  stable.   On the morning of October 30, 2004, she remained stable; however, the family  was concerned about the patient's deconditioning, especially with her recent  stroke.  It was felt that she needed to stay and ambulate further and then  plan for discharge home.   She was seen by case management to discuss for home health services.  It was  noted that the patient was home-bound secondary to previous stroke and right-  sided weakness.  She would qualify for ADL assistance, as she has Medicaid.  They plan to arrange for home health services.  Orders were written for home  health PT, OT, and aide.   Over the day, she walked with cardiac rehab and improved.   By October 31, 2004, she remains stable.  She is afebrile at 97.4.  Pulse is  57, blood pressure 110/57.  Labs are stable with hemoglobin of 9.9,  hematocrit stable.  At this point, she is seen and evaluated by Dr. Yates Decamp, who deems her stable for discharge home.   HOSPITAL CONSULTS:  She just had case manager consult regarding disposition  planning.   HOSPITAL PROCEDURES:  1.  Cardiac catheterization by Dr. Nicki Guadalajara on October 27, 2004.  He      performed complex lengthy intervention - SVG to RCA.  See the dictated      report for detail.  2.  Blood transfusion on October 28, 2004.   RADIOLOGY:  Chest x-ray October 27, 2004, shows cardiomegaly and vascular  congestion.   LABORATORY DATA:  On October 28, 2004, hemoglobin was 8.2, hematocrit 25.2.  The following day, it was 10.3, 31.1.  On October 30, 2004, it was 9.9 and  29.9.  White count and platelet counts were normal throughout the admission.  On October 28, 2004, sodium 134, potassium 3.9, BUN 13, creatinine 0.8.  On  October 30, 2004, sodium 138, potassium 4.1, BUN 20, creatinine 1.2.  Cardiac enzymes show CK 90, MB 2.6, troponin 0.02.  Urinalysis showed  moderate leukocytes, and she was treated with Cipro.   DISCHARGE MEDICATIONS:  1.  Plavix 75 mg once  daily.  2.  Aspirin 81 mg once daily.  3.  Nu-Iron 150 mg daily.  4.  Metoprolol 50 mg t.i.d.  5.  Nexium 40 mg twice daily.  6.  Vytorin 10/40 once daily.  7.  Imdur 60 mg once daily.  8.  Lotrel 5/10 mg once daily.  9.  Folic acid.   DISCHARGE INSTRUCTIONS:  No strenuous activity, lifting greater than 5  pounds, driving, or sexual activity for three days.  Low cholesterol diet.  Wash the groin site with warm water and soap.  Call [phone] for any  bleeding from the groin site.  Have blood drawn to check a CBC in one week.  We will follow up her anemia.  She has an appointment to see Dr. Tresa Endo on  November 16, 2004, at 4 p.m.      MBE/MEDQ  D:  10/31/2004  T:  10/31/2004  Job:  [id number]  cc:   Nicki Guadalajara, M.D.  [address]  [place], [place] [address]  Fax: [phone]   Bernette Redbird, M.D.  [address] [place]  [place], [place] [address]  Fax: [phone]  Doris Cheadle. Foy Guadalajara, M.D.  [address] [place]  [place] [address]  Fax: [phone]

## 2011-02-09 NOTE — Consult Note (Signed)
[name], [name]             ACCOUNT NO.:  [id number]   MEDICAL RECORD NO.:  [id number]          PATIENT TYPE:  INP   LOCATION:  6522                         FACILITY:  [facility]   PHYSICIAN:  Bernette Redbird, M.D.   DATE OF BIRTH:  08/16/1931   DATE OF CONSULTATION:  DATE OF DISCHARGE:                                   CONSULTATION   PROCEDURE:  Gastroenterology evaluation.   Dr. Tresa Endo asked [place] to see this 77 year old immigrant from [place] because  of anemia and Heme positive stool.   [name] was admitted to the hospital two days ago with a right body CVA  associated also with aphagia and weakness.  Apparently, she has been found  to be Hemoccult positive as an outpatient, per Dr. Landry Dyke note, and on  admission, it was noted that her hemoglobin was 9.5 with an MCV of 76 (RDW  19), as compared to a hemoglobin of 10.9 approximately two months earlier.  Note that an iron saturation level is normal at 42%, but the patient has  been on iron as an outpatient for the last several weeks.   As far as I am aware, the patient has never had an ulcer or GI bleeding.  She has been noted to be anemic recently and was started on iron as noted  above.  Also, the patient was switched from ranitidine to Nexium about a  month ago.  Of note, she is on indomethacin as well as Ecotrin 325 mg daily  prior to admission.  In the hospital, she is on Protonix and an 81 mg  aspirin tablet.  She was very briefly started on Plavix a couple of days ago  while being arranged for hospital percutaneous coronary intervention because  of significant recurrent coronary disease (status post previous CABG).   PAST MEDICAL HISTORY:  Allergy to penicillin.   OUTPATIENT MEDICATIONS:  Ultram, Toprol, Plavix, Hemacyte, Imdur, K Cl,  Demadex, Reglan, Nexium, and previously indomethacin, folic acid, and  Lotrel.   OPERATIONS:  CABG in the year 2000.   MEDICAL ILLNESSES:  Hx of MI, PTCA , CABG.  No known  pulmonary disease or  frank diabetes.   HABITS:  Nonsmoker, nondrinker.   SOCIAL HISTORY:  The patient is an immigrant from [place] and Arabic is  her native tongue.  Her daughter is at the bedside and helps to interpret  for her.   REVIEW OF SYSTEMS:  The patient's daughter relates that occasionally the  patient gets what sounds like fairly sharp epigastric pain.  She also has  occasional constipation.  She has had involuntary weight loss of perhaps 20  pounds over the past year.   PHYSICAL EXAMINATION:  Awake, alert, but minimally communicative, middle-  [place] female in no evident distress.  Chest and heart are unremarkable.  The abdomen is still somewhat adipose but without discernable organomegaly,  guarding, mass or tenderness.  Rectal:  Mushy black stool consistent with  her iron therapy, with a specimen sent to the lab for Hemoccult analysis  (results pending).   IMPRESSION:  1.  Hemoccult positive stool as outpatient, current status  pending.  2.      Microcytic anemia with normal iron studies but currently on iron      supplementation which might falsely elevate the iron studies.  3.      History of ulcerogenic medication exposure (aspirin and Indocin) but      also covered with Nexium.  4. Medical illnesses including coronary      disease and current cerebral vascular accident.   RECOMMENDATIONS:  1.  I would favor endoscopic evaluation on this admission and have discussed      the nature, purpose, and risk of the procedure with the patient's      daughter who appears to have a very clear understanding and is very      conversant in [place] and is agreeable to proceed.  I feel that this      test could be done quite safely even despite the patient's coronary      disease and recent stroke and may be very important if a percutaneous      coronary intervention is needed, with subsequent anticoagulation which      would place the patient at a risk for bleeding from an  upper tract      lesion if one were present.  2. On the other hand, given the patient's      current debilitated state, and the relative absence of risk factors for      lower tract pathology, I would tend to hold off on lower tract      evaluation at least for the time being.  This might be considered after      an outpatient after her heart issues are addressed, especially if the      endoscopy is negative and/or she remains Hemoccult positive.  Perhaps      the lower tract could be evaluated by barium enema at that time with      less risk to the patient and yet it would still be sufficient for ruling      out gross fungating lesions in the lower tract.   I appreciate the opportunity to have seen this patient in consultation with  you.       RB/MEDQ  D:  09/28/2004  T:  09/28/2004  Job:  [id number]   cc:   Nicki Guadalajara, M.D.  [phone], [address]  [place], [place] [address]  Fax: [phone]   Doris Cheadle. Foy Guadalajara, M.D.  [address] [place]  [place] [address]  Fax: [phone]

## 2011-02-09 NOTE — Op Note (Signed)
[name], [name]             ACCOUNT NO.:  [id number]   MEDICAL RECORD NO.:  [id number]          PATIENT TYPE:  INP   LOCATION:  6522                         FACILITY:  [facility]   PHYSICIAN:  Bernette Redbird, M.D.   DATE OF BIRTH:  08/16/1931   DATE OF PROCEDURE:  09/29/2004  DATE OF DISCHARGE:  09/30/2004                                 OPERATIVE REPORT   PROCEDURE PERFORMED:  Upper endoscopy.   Please note this may be a redictation. I thought that procedure was dictated  at that time it was performed, but we are unable to locate a copy of the  procedure note on the chart, so I am dictating the procedure report at this  time.   INDICATION:  77 year old middle [place] immigrant with anemia and heme-  positive stool.   FINDINGS:  Small hiatal hernia.   PROCEDURE:  The nature, purpose, risks of the procedure had been discussed  with the patient with help of her daughter at the bedside, who interpreted  for her and she provided written consent and was brought from her hospital  room in a fasted state to the endoscopy unit. No sedation was administered.  The Olympus endoscope was passed into the esophagus which had normal  appearance. No reflux esophagitis, varices, infection or neoplasia were  seen. There did appear to be a small hiatal hernia. The stomach was normal  without evidence of gastritis, erosions, ulcers, polyps or masses and the  pylorus, duodenal bulb and second duodenum looked normal. In particular, I  saw no evidence of any aspirin or Plavix induced gastropathy or ulceration.   The scope was removed from the patient who tolerated the procedure well and  there no apparent complications. No biopsies were obtained.   IMPRESSION:  1.  No source of heme positive stool for anemia seen on current examination      (792.1).  2.  Okay to use aspirin and Plavix or even full anticoagulation if needed      for coronary reasons or coronary procedures.  3.  Since the patient  had previously been Hemoccult positive and no source      of heme positivity was identified on this exam, it may be worth      considering a barium enema examination and I will communicate this to      the patient's primary physician.      RB/MEDQ  D:  11/26/2004  T:  11/27/2004  Job:  [id number]   cc:   Nicki Guadalajara, M.D.  [address], [address]  [place], [place] [address]  Fax: [phone]   Doris Cheadle. Foy Guadalajara, M.D.  [address] [place]  [place] [address]  Fax: [phone]

## 2011-02-09 NOTE — Discharge Summary (Signed)
[name], STANISLAWSKI             ACCOUNT NO.:  [id number]   MEDICAL RECORD NO.:  [id number]          PATIENT TYPE:  INP   LOCATION:  6522                         FACILITY:  [facility]   PHYSICIAN:  Nicki Guadalajara, M.D.     DATE OF BIRTH:  08/16/1931   DATE OF ADMISSION:  09/26/2004  DATE OF DISCHARGE:  09/30/2004                                 DISCHARGE SUMMARY   This is a 77 year old female from [place] who does not speak clear  English.  Her daughter usually interprets for her.  She came to the hospital  after having changes of a CVA after undergoing a cardiac catheterization on  the date of admission at [facility].  She has a history of  coronary artery disease with a bypass grafting in the year 2000 with LIMA to  LAD, left radial artery to OM, SVG to PDA, and SVG tot he diagonal vessel.  She apparently was having some chest discomfort.  She was sent for a  Cardiolite, and it came back positive in the area of the inferolateral wall  apically to basally.  Thus, she underwent cardiac catheterization at  [facility] by Dr. Tresa Endo on September 26, 2004.  She was found to  have an occluded left radial to the OM.  LIMA to LAD was patent.  SVG to PDA  had a 95% stenosis.  It was planned that she would undergo stenting at the  end of the week; however, after getting home that evening, she had symptoms  of left-sided weakness and aphasia.  She came into the hospital.  She was  seen by Dr. Sharene Skeans.  She underwent CT scan which showed no acute changes  and MRI and MRA  Then she was found to have atheroemboli of the left  subcortical infarct.  Dr. Pearlean Brownie advised that her coronary intervention be  postponed for at least one to two weeks so that she can recuperate from her  stroke.  Plavix and aspirin were recommended.   She was seen by physical therapy while in hospital.  She needed assistance  to walk and move around.  She did undergo echocardiogram while here to  assess  for clot.  It was inconclusive.  She has a history of anemia with  guaiac-positive stool as an outpatient.  Guaiac stool in hospital was  negative.  She underwent GI consult with Dr. Matthias Hughs.  She underwent EGD,  there was no sight of bleed in her stomach.  He advised continuing with PPI  coverage forever.  He felt it was okay for aspirin and Plavix and  anticoagulation if necessary.  He also said to consider a barium enema and  questioned is she does not have chronic anemia secondary to Mediterranean  hemoglobinopathy.  Lotrel had been held as an outpatient post  catheterization.  Dr. Tresa Endo decided to restart this.  On September 30, 2004,  she was considered stable for discharge home.  She was having mild  temperature elevation of 99.6 to 100.2.  Urinalysis was done and negative  for any pathogens.  Blood pressure 104/58.  Heart rate 100,  respirations 18.  She was seen by the case manager.  Home health care was set up.  She will  have home health RN and aide.  She will get a bedside commode.  The daughter  requested oxygen.  We think this is necessary until her coronary  intervention is done to use on a p.r.n. basis if she has any chest pain.  She does have coronary ischemia by Cardiolite and high-grade lesion in her  SVT to  RCA with progressive disease, and her radial graft to OM is  occluded.   LABORATORY DATA AND OTHER STUDIES:  October 01, 2003, hemoglobin 9.2,  hematocrit 30, platelets 215, WBC 8.2.  Sodium 137, potassium 4.1, BUN 25,  creatinine 1.1, glucose 119.  Glycosylated hemoglobin was 5.5.  Occult blood  was negative.  Homocysteine was 13.636.  Lipid profile:  Total cholesterol  156, triglycerides 168, HDL 47, LDL 75.  AST 230, ALP 16, alkaline  phosphatase 122.  TSH 0.347.  Iron 125, TIBC 296, percent saturation 42.  Again, urine was negative.   MRI and MRA of the head showed acute infarct of her left basal ganglia,  generalized atrophy.  MRA showed moderate diffuse stenosis  of the right  cavernous carotid.  Left middle cerebral arteries were patent.  There was  moderate stenosis of the right posterior cerebral artery.   CT of the head showed atrophy without acute intracranial abnormality.   I do not see a chest x-ray that was done.   A 2-D echocardiogram done September 27, 2004, showed LV systolic function  normal, EF 55 to 65%.  The study was inadequate for evaluation of thrombus.   She did have carotid Dopplers done on September 27, 2004.  Right showed no ICA  stenosis.  Left showed 40 to 60% ICA stenosis at the lowest end of the  scale, bilateral vertebral artery flow antegrade.  She did have bilateral  moderate diffuse disease throughout.   DISCHARGE MEDICATIONS:  1.  Imdur 30 mg 1 tablet daily.  2.  Metoprolol 50 mg 2 tablets daily.  3.  Nexium 40 mg 1 time per day.  4.  Plavix 75 mg 1 time per day.  5.  Aspirin 81 mg 1 time per day.  6.  Vitorin 10/40 one time per day.  7.  Lotrel 5/20 one time per day.  8.  Tylenol for pain if needed every 4 to 6 hours.  9.  She is only to take her Demadex 20 mg if her swelling gets severe and      take potassium with it.  10. She is not to take any indomethacin.  11. She should take folic acid 1 mg one time per day.  12. Nitroglycerin 1 of 50, take 1 under tongue if needed for chest pain.   She should move her legs frequently when awake, contract her calf muscles by  moving her feet or raising her legs.  She should stand up and lift her legs  alternatively. Home health aide to provide assistance.  To use oxygen at 2  liters if needed for chest pain or shortness of breath.  She should call if  she has frequent or severe episodes of chest pain.  She should come to Dr.  Landry Dyke office to see myself on January 19 at 10 a.m. to set her up for  coronary intervention planned on October 16, 2004.   DISCHARGE DIAGNOSES: 1.  Status post atheroembolic cerebrovascular accident status post cardiac  catheterization.   This is a left subcortical infarction.  2.  Atherosclerotic cardiovascular disease progressive disease, undergoing      coronary artery bypass grafting in 2000, now with her radial artery to      her obtuse marginal occluded, and she has a high-grade stenosis in      saphenous vein graft to right coronary artery at 95% in mid graft.  3.  Normal left ventricular function.  4.  Atherosclerotic peripheral vascular disease with intercerebral stenosis      per MRA.  She also has bilateral moderate diffuse disease in her carotid      arteries.  She has 40 to 60% on the last.  5.  Anemia with history of one guaiac stool as outpatient, negative black      stool inpatient.  She did have a consult by Dr. Matthias Hughs and an      esophagogastroduodenoscopy  which did not show any source of bleed.  Dr.      Matthias Hughs felt it would be appropriate to do a barium enema as outpatient      if primary care thought it was necessary.  He also questioned if she did      not have Mediterranean hemoglobinopathy or chronic anemia.  6.  Hypertension.  7.  History of congestive heart failure.  8.  Gastroesophageal reflux disease.  Dr.  Matthias Hughs suggested proton pump      inhibitor forever.  9.  Ejection fraction 55 to 65% on echocardiogram.  10. Debility secondary to stroke.       BB/MEDQ  D:  09/30/2004  T:  09/30/2004  Job:  [id number]   cc:   Bernette Redbird, M.D.  [address] [place]  [place], [place] [address]  Fax: [phone]   Doris Cheadle. Foy Guadalajara, M.D.  [address] [place]  [place] [address]  Fax: [phone]

## 2011-02-09 NOTE — Cardiovascular Report (Signed)
[name], [name]             ACCOUNT NO.:  [id number]   MEDICAL RECORD NO.:  [id number]          PATIENT TYPE:  INP   LOCATION:  2013                         FACILITY:  [facility]   PHYSICIAN:  Nicki Guadalajara, M.D.     DATE OF BIRTH:  08/16/1931   DATE OF PROCEDURE:  10/27/2004  DATE OF DISCHARGE:  10/31/2004                              CARDIAC CATHETERIZATION   INDICATIONS:  [name] is a 77 year old female originally from [place]  who has known coronary artery disease. Initial catheterization in 1993  showed total distal RCA occlusion with moderate mid LAD disease and  circumflex disease.  In June 2001, she underwent bypass surgery with a LIMA  to the LAD and left radial artery to the obtuse marginal, a vein graft to  the PDA and a vein graft to the diagonal vessel.  She recently experienced  chest pain which led to cardiac catheterization at the [facility] on September 26, 2004.  This revealed normal global LV function with  mild inferobasal and inferolateral hypercontractility with a least 1+ mitral  regurgitation.  She had significant native CAD with tubular 50% narrowing in  the proximal LAD, followed by total occlusion after the second septal  perforating artery. There was a 40% ostial circumflex with 30% mid and 40%  OM stenosis, as well as high-grade 95% ostial stenosis in the right coronary  artery with 70% mid stenosis and 95% distal stenosis with total occlusion of  the PDA with collaterals.  She had an occluded left radial artery graft  which previously had supplied the circumflex marginal vessel.  She had an  ectatic but without significant stenosed vein graft supplying the diagonal  vessel which also filled the LAD system with 70% narrowing in the LAD at the  diagonal takeoff.  There was a 95% focal stenosis in the proximal third of  the vein graft supplying the PDA.  She had an ectatic left internal mammary  artery to the LAD most likely due to  competitive filling of the LAD via the  diagonal vein graft.   The patient did develop mild neurologic symptoms the day following the  catheterization, leading to her  hospitalization.  Consequently, neurologic  status was needed to stabilize and ultimately because the patient continued  to have chest discomfort when was felt to be neurologically stable on  October 27, 2004, she was brought to [facility] for attempt at  coronary intervention of the vein graft supplying her right coronary artery.   PROCEDURE:  This was a difficult, arduous, long procedure.  Her right  femoral artery was punctured anteriorly and a 6-French sheath was inserted.  The patient had received 4 mg of Valium for sedation.  A 6-French right  coronary bypass graft guide was used for the procedure.  The patient was  treated with heparin as well as Integrilin in addition to Plavix therapy.  Site of angiography in the vein graft now revealed subtotal/total occlusion  of the vein graft at the site of prior 95% stenosis with TIMI-0 to TIMI-1/2  flow beyond this stenosis.  A Luge  wire was initially inserted and  ultimately was successful in crossing the subtotal/total occlusion distally.  A Maverick 2.5 x 20 mm balloon was initially inserted.  The patient had also  received IC nitroglycerin down the vein graft.  Initial low atmosphere  dilatations were made  which resulted in restoration of flow in this  severely diseased graft.  A Cypher 3.5 x 33 mm stent was initially inserted  and inflated to 14 atmospheres.  A second stent was then inserted, a Cypher  3.5 x 28 mm, in tandem proximal to the mid placed stent to cover the initial  lesion.  Since there was still disease in the region of the crux in the  stent, a third stent was inserted, 3.5 x 33 mm, at the crux.  At this point,  backup was becoming somewhat difficult.  An attempt was made to initially  post dilate with a 3.75 x 20 mm Quantum balloon within the  3.5 stents.  Unfortunately, at this time due to very poor backup support, the wire pulled  back.  Ultimately, the wire successfully recrossed the stented segments to  the distal PDA vessel.  However, initial dilatation at the stent in the crux  resulted in initial decreased flow, suggesting the possibility that the wire  may have gone in and out of a strut within this crux.  Consequently,  multiple attempts were made in an attempt to recross entirely within the  lumen in the distal stent.  Several wires were inserted and used in a buddy  wire fashion in an attempt to insure optimal filling.  Post stent dilatation  was done proximally in the mid and proximal mid stents, and again numerous  dilatations were done in the more distal stent at the crux but the balloon  could never be completely advanced beyond this.  Scout angiography, however,  revealed restoration of flow in the vein graft to at least 2-1/2 to 3 with a  patent saphenous vein graft at the completion of the procedure. It was felt  that Integrilin would be administered for a minimum of 48 hours to decrease  risk of subacute thrombosis.   During the procedure, the patient's daughter was in the lab the entire time  due to the patient's language difficulties and not speaking [place].  She  tolerated the procedure well and returned to her room in satisfactory  condition.   ANGIOGRAPHIC DATA:  Central aortic pressure is 156/70, mean 106.   Angiographic data:  Initial angiography:  Initial injection into the vein  graft supplying the right coronary artery showed 99/100% occlusion of the  vein graft in the proximal third with TIMI-0 to TIMI-/2 flow immediately  beyond the 99% stenosis but the remainder of the graft was not visualized at  all.  Ultimately, three stents were placed, 3.5 x 28 proximally, 3.5 x 33  in the mid segment and 3.5 x 33 in the region of the crux with the stenoses being reduced to 0% at the proximal mid stented  segment and being reduced  from 80% to 10% in the stent at the cruxed segment.  At the completion of  the procedure, TIMI flow had improved from TIMI-0 to TIMI-2/1 to 3.  The  patient was pain-free.  There was no evidence for dissection.   IMPRESSION:  Difficult but successful PTCA/tenting of a subtotally/totally  occluded vein graft supplying the right coronary artery with placement of  three tandem Cypher stents, 3.5 x 28, 3.5 x 33, 3.5  x 33 in the vein graft  with TIMI-0 flow been improved to TIMI-2-1/2 to 3 flow.       TK/MEDQ  D:  03/22/2005  T:  03/22/2005  Job:  [id number]   cc:   Cardiac Catheterization Laboratory

## 2011-04-17 ENCOUNTER — Encounter (HOSPITAL_BASED_OUTPATIENT_CLINIC_OR_DEPARTMENT_OTHER): Payer: Medicare Other | Admitting: Oncology

## 2011-04-17 ENCOUNTER — Other Ambulatory Visit: Payer: Self-pay | Admitting: Oncology

## 2011-04-17 DIAGNOSIS — D509 Iron deficiency anemia, unspecified: Secondary | ICD-10-CM

## 2011-04-17 LAB — CBC WITH DIFFERENTIAL/PLATELET
BASO%: 0.4 % (ref 0.0–2.0)
EOS%: 1.7 % (ref 0.0–7.0)
MCH: 27.1 pg (ref 25.1–34.0)
MCHC: 32.1 g/dL (ref 31.5–36.0)
RBC: 3.95 10*6/uL (ref 3.70–5.45)
RDW: 18.3 % — ABNORMAL HIGH (ref 11.2–14.5)
lymph#: 1.9 10*3/uL (ref 0.9–3.3)

## 2011-05-30 ENCOUNTER — Emergency Department (HOSPITAL_COMMUNITY)
Admission: EM | Admit: 2011-05-30 | Discharge: 2011-05-30 | Disposition: A | Discharge status: Home / Self Care | Payer: Medicare Other | Attending: Emergency Medicine | Admitting: Emergency Medicine

## 2011-05-30 ENCOUNTER — Emergency Department (HOSPITAL_COMMUNITY): Payer: Medicare Other

## 2011-05-30 DIAGNOSIS — K5900 Constipation, unspecified: Secondary | ICD-10-CM | POA: Insufficient documentation

## 2011-05-30 DIAGNOSIS — E785 Hyperlipidemia, unspecified: Secondary | ICD-10-CM | POA: Insufficient documentation

## 2011-05-30 DIAGNOSIS — I10 Essential (primary) hypertension: Secondary | ICD-10-CM | POA: Insufficient documentation

## 2011-05-30 DIAGNOSIS — E119 Type 2 diabetes mellitus without complications: Secondary | ICD-10-CM | POA: Insufficient documentation

## 2011-05-30 DIAGNOSIS — K219 Gastro-esophageal reflux disease without esophagitis: Secondary | ICD-10-CM | POA: Insufficient documentation

## 2011-05-30 DIAGNOSIS — N390 Urinary tract infection, site not specified: Secondary | ICD-10-CM | POA: Insufficient documentation

## 2011-05-30 DIAGNOSIS — R112 Nausea with vomiting, unspecified: Secondary | ICD-10-CM | POA: Insufficient documentation

## 2011-05-30 DIAGNOSIS — R1032 Left lower quadrant pain: Secondary | ICD-10-CM | POA: Insufficient documentation

## 2011-05-30 LAB — DIFFERENTIAL
Basophils Absolute: 0 10*3/uL (ref 0.0–0.1)
Basophils Relative: 1 % (ref 0–1)
Lymphocytes Relative: 29 % (ref 12–46)
Neutro Abs: 3.9 10*3/uL (ref 1.7–7.7)

## 2011-05-30 LAB — URINALYSIS, ROUTINE W REFLEX MICROSCOPIC
Bilirubin Urine: NEGATIVE
Ketones, ur: NEGATIVE mg/dL
Protein, ur: NEGATIVE mg/dL
Urobilinogen, UA: 0.2 mg/dL (ref 0.0–1.0)

## 2011-05-30 LAB — CBC
Hemoglobin: 10.9 g/dL — ABNORMAL LOW (ref 12.0–15.0)
MCH: 28.1 pg (ref 26.0–34.0)
MCV: 84.8 fL (ref 78.0–100.0)
RBC: 3.88 MIL/uL (ref 3.87–5.11)

## 2011-05-30 LAB — COMPREHENSIVE METABOLIC PANEL
ALT: 24 U/L (ref 0–35)
CO2: 30 mEq/L (ref 19–32)
Calcium: 9.3 mg/dL (ref 8.4–10.5)
Creatinine, Ser: 0.91 mg/dL (ref 0.50–1.10)
GFR calc Af Amer: >60 mL/min (ref >60)
GFR calc non Af Amer: 60 mL/min — ABNORMAL LOW (ref >60)
Glucose, Bld: 71 mg/dL (ref 70–99)
Sodium: 130 mEq/L — ABNORMAL LOW (ref 135–145)

## 2011-05-30 LAB — URINE MICROSCOPIC-ADD ON

## 2011-05-30 MED ORDER — IOHEXOL 300 MG/ML  SOLN
100.0000 mL | Freq: Once | INTRAMUSCULAR | Status: AC | PRN
  Administered 2011-05-30: 100 mL via INTRAVENOUS

## 2011-06-05 ENCOUNTER — Other Ambulatory Visit: Payer: Self-pay | Admitting: Oncology

## 2011-06-05 ENCOUNTER — Encounter (HOSPITAL_BASED_OUTPATIENT_CLINIC_OR_DEPARTMENT_OTHER): Payer: Medicare Other | Admitting: Oncology

## 2011-06-05 DIAGNOSIS — D649 Anemia, unspecified: Secondary | ICD-10-CM

## 2011-06-05 DIAGNOSIS — D509 Iron deficiency anemia, unspecified: Secondary | ICD-10-CM

## 2011-06-05 LAB — CBC WITH DIFFERENTIAL/PLATELET
Basophils Absolute: 0.1 10*3/uL (ref 0.0–0.1)
Eosinophils Absolute: 0.1 10*3/uL (ref 0.0–0.5)
HCT: 32.7 % — ABNORMAL LOW (ref 34.8–46.6)
HGB: 10.8 g/dL — ABNORMAL LOW (ref 11.6–15.9)
LYMPH%: 23.3 % (ref 14.0–49.7)
MCV: 87.2 fL (ref 79.5–101.0)
MONO%: 8 % (ref 0.0–14.0)
NEUT#: 3.9 10*3/uL (ref 1.5–6.5)
NEUT%: 65.3 % (ref 38.4–76.8)
Platelets: 196 10*3/uL (ref 145–400)

## 2011-06-08 ENCOUNTER — Emergency Department (HOSPITAL_COMMUNITY): Payer: Medicare Other

## 2011-06-08 ENCOUNTER — Inpatient Hospital Stay (HOSPITAL_COMMUNITY)
Admission: EM | Admit: 2011-06-08 | Discharge: 2011-06-12 | DRG: 637 | Disposition: A | Discharge status: Home / Self Care | Payer: Medicare Other | Attending: Internal Medicine | Admitting: Internal Medicine

## 2011-06-08 DIAGNOSIS — R5381 Other malaise: Secondary | ICD-10-CM | POA: Diagnosis present

## 2011-06-08 DIAGNOSIS — Z951 Presence of aortocoronary bypass graft: Secondary | ICD-10-CM

## 2011-06-08 DIAGNOSIS — Z8673 Personal history of transient ischemic attack (TIA), and cerebral infarction without residual deficits: Secondary | ICD-10-CM

## 2011-06-08 DIAGNOSIS — D649 Anemia, unspecified: Secondary | ICD-10-CM | POA: Diagnosis present

## 2011-06-08 DIAGNOSIS — B0229 Other postherpetic nervous system involvement: Secondary | ICD-10-CM | POA: Diagnosis present

## 2011-06-08 DIAGNOSIS — M109 Gout, unspecified: Secondary | ICD-10-CM | POA: Diagnosis present

## 2011-06-08 DIAGNOSIS — K219 Gastro-esophageal reflux disease without esophagitis: Secondary | ICD-10-CM | POA: Diagnosis present

## 2011-06-08 DIAGNOSIS — G9341 Metabolic encephalopathy: Secondary | ICD-10-CM | POA: Diagnosis present

## 2011-06-08 DIAGNOSIS — I509 Heart failure, unspecified: Secondary | ICD-10-CM | POA: Diagnosis present

## 2011-06-08 DIAGNOSIS — Z880 Allergy status to penicillin: Secondary | ICD-10-CM

## 2011-06-08 DIAGNOSIS — E1169 Type 2 diabetes mellitus with other specified complication: Principal | ICD-10-CM | POA: Diagnosis present

## 2011-06-08 DIAGNOSIS — T383X5A Adverse effect of insulin and oral hypoglycemic [antidiabetic] drugs, initial encounter: Secondary | ICD-10-CM | POA: Diagnosis present

## 2011-06-08 DIAGNOSIS — I6789 Other cerebrovascular disease: Secondary | ICD-10-CM | POA: Diagnosis present

## 2011-06-08 DIAGNOSIS — I2510 Atherosclerotic heart disease of native coronary artery without angina pectoris: Secondary | ICD-10-CM | POA: Diagnosis present

## 2011-06-08 DIAGNOSIS — I10 Essential (primary) hypertension: Secondary | ICD-10-CM | POA: Diagnosis present

## 2011-06-08 DIAGNOSIS — E871 Hypo-osmolality and hyponatremia: Secondary | ICD-10-CM | POA: Diagnosis present

## 2011-06-08 DIAGNOSIS — R259 Unspecified abnormal involuntary movements: Secondary | ICD-10-CM | POA: Diagnosis present

## 2011-06-08 DIAGNOSIS — K5900 Constipation, unspecified: Secondary | ICD-10-CM | POA: Diagnosis not present

## 2011-06-08 DIAGNOSIS — Z7902 Long term (current) use of antithrombotics/antiplatelets: Secondary | ICD-10-CM

## 2011-06-08 LAB — DIFFERENTIAL
Basophils Absolute: 0 10*3/uL (ref 0.0–0.1)
Basophils Relative: 1 % (ref 0–1)
Eosinophils Absolute: 0.1 10*3/uL (ref 0.0–0.7)
Eosinophils Relative: 1 % (ref 0–5)
Lymphocytes Relative: 28 % (ref 12–46)
Lymphs Abs: 1.8 10*3/uL (ref 0.7–4.0)
Monocytes Absolute: 0.7 10*3/uL (ref 0.1–1.0)
Monocytes Relative: 11 % (ref 3–12)
Neutro Abs: 3.8 10*3/uL (ref 1.7–7.7)
Neutrophils Relative %: 60 % (ref 43–77)

## 2011-06-08 LAB — URINALYSIS, ROUTINE W REFLEX MICROSCOPIC
Bilirubin Urine: NEGATIVE
Glucose, UA: NEGATIVE mg/dL
Hgb urine dipstick: NEGATIVE
Ketones, ur: NEGATIVE mg/dL
Leukocytes, UA: NEGATIVE
Nitrite: NEGATIVE
Protein, ur: NEGATIVE mg/dL
Specific Gravity, Urine: 1.008 (ref 1.005–1.030)
Urobilinogen, UA: 0.2 mg/dL (ref 0.0–1.0)
pH: 5.5 (ref 5.0–8.0)

## 2011-06-08 LAB — CBC
HCT: 35.1 % — ABNORMAL LOW (ref 36.0–46.0)
Hemoglobin: 11.7 g/dL — ABNORMAL LOW (ref 12.0–15.0)
MCH: 28.2 pg (ref 26.0–34.0)
MCHC: 33.3 g/dL (ref 30.0–36.0)
MCV: 84.6 fL (ref 78.0–100.0)
Platelets: 225 10*3/uL (ref 150–400)
RBC: 4.15 MIL/uL (ref 3.87–5.11)
RDW: 18.3 % — ABNORMAL HIGH (ref 11.5–15.5)
WBC: 6.4 10*3/uL (ref 4.0–10.5)

## 2011-06-08 LAB — POCT I-STAT TROPONIN I: Troponin i, poc: 0.01 ng/mL (ref 0.00–0.08)

## 2011-06-08 LAB — COMPREHENSIVE METABOLIC PANEL
ALT: 15 U/L (ref 0–35)
AST: 19 U/L (ref 0–37)
Albumin: 3.6 g/dL (ref 3.5–5.2)
Alkaline Phosphatase: 93 U/L (ref 39–117)
BUN: 11 mg/dL (ref 6–23)
CO2: 26 mEq/L (ref 19–32)
Calcium: 9.8 mg/dL (ref 8.4–10.5)
Chloride: 89 mEq/L — ABNORMAL LOW (ref 96–112)
Creatinine, Ser: 0.75 mg/dL (ref 0.50–1.10)
GFR calc Af Amer: >60 mL/min (ref >60)
GFR calc non Af Amer: >60 mL/min (ref >60)
Glucose, Bld: 101 mg/dL — ABNORMAL HIGH (ref 70–99)
Potassium: 4.3 mEq/L (ref 3.5–5.1)
Sodium: 126 mEq/L — ABNORMAL LOW (ref 135–145)
Total Bilirubin: 0.5 mg/dL (ref 0.3–1.2)
Total Protein: 6.8 g/dL (ref 6.0–8.3)

## 2011-06-09 LAB — BASIC METABOLIC PANEL
CO2: 28 mEq/L (ref 19–32)
Calcium: 9.6 mg/dL (ref 8.4–10.5)
Creatinine, Ser: 0.7 mg/dL (ref 0.50–1.10)
GFR calc Af Amer: >60 mL/min (ref >60)
GFR calc non Af Amer: >60 mL/min (ref >60)
Sodium: 132 mEq/L — ABNORMAL LOW (ref 135–145)

## 2011-06-09 LAB — CBC
MCH: 28.1 pg (ref 26.0–34.0)
MCHC: 32.9 g/dL (ref 30.0–36.0)
MCV: 85.3 fL (ref 78.0–100.0)
Platelets: 223 10*3/uL (ref 150–400)
RBC: 3.67 MIL/uL — ABNORMAL LOW (ref 3.87–5.11)
RDW: 18.6 % — ABNORMAL HIGH (ref 11.5–15.5)

## 2011-06-09 LAB — HEMOGLOBIN A1C
Hgb A1c MFr Bld: 5.1 % (ref <5.7)
Mean Plasma Glucose: 100 mg/dL (ref <117)

## 2011-06-10 ENCOUNTER — Inpatient Hospital Stay (HOSPITAL_COMMUNITY): Payer: Medicare Other

## 2011-06-10 LAB — URINE CULTURE
Colony Count: NO GROWTH
Culture  Setup Time: 201209150321
Culture: NO GROWTH

## 2011-06-10 LAB — GLUCOSE, CAPILLARY: Glucose-Capillary: 98 mg/dL (ref 70–99)

## 2011-06-10 NOTE — H&P (Signed)
[name], [name]             ACCOUNT NO.:  [id number]  MEDICAL RECORD NO.:  [id number]  LOCATION:  WLED                         FACILITY:  [facility]  PHYSICIAN:  [name], M.D. DATE OF BIRTH:  01/31/1931  DATE OF ADMISSION:  06/08/2011 DATE OF DISCHARGE:                             HISTORY & PHYSICAL   DATE OF ADMISSION:  June 08, 2011.  PRIMARY CARE PHYSICIAN:  Unassigned.  CHIEF COMPLAINT:  Confusion.  HISTORY OF PRESENT ILLNESS:  This is a 77 year old non-English speaking female who was brought by ambulance from her home to the emergency department secondary to worsening confusion over the past 2 weeks.  As an outpatient, she had been treated for urinary tract infection recently.  She continued to worsen; however, and had increased weakness per report of the family.  The patient has not had any complaints of chest pain or episodes of syncope, or episodes of fever, chills, nausea, vomiting or diarrhea.  The patient was evaluated in the emergency department a CT scan of the head was performed, which was negative for any acute findings.  The patient did, however, have abnormal laboratory studies, which revealed a sodium level of 126.  She was referred for medical admission.  PAST MEDICAL HISTORY: 1. Previous CVA. 2. Hypertension. 3. Coronary artery disease, status post coronary artery bypass     grafting. 4. TIAs. 5. Gastroesophageal reflux disease. 6. Previous history of herpes zoster. 7. Gout.  MEDICATIONS:  Colchicine, folic acid, Imdur, metformin, metoprolol tartrate, Nexium, iron, Plavix, Aldactone, Mysoline, torsemide, Senokot, Vytorin, Glucotrol, Amitiza, and Tylenol.  ALLERGIES:  Penicillin.  SOCIAL HISTORY:  The patient is a nonsmoker, nondrinker.  No history of illicit drug usage.  FAMILY HISTORY:  Unable to obtain.  REVIEW OF SYSTEMS:  Unable to obtain.  PHYSICAL EXAMINATION FINDINGS:  GENERAL:  This is an elderly 77 year old morbidly  obese Caucasian female who is in no discomfort or acute distress currently. VITAL SIGNS:  Temperature 99.1, blood pressure 144/57, heart rate 86, respirations 19, and O2 saturations 96%. HEENT:  Normocephalic, atraumatic.  Pupils equally round, reactive to light.  Extraocular movements are intact.  Funduscopic benign.  There are no scleral icterus.  Nares are patent bilaterally.  Oropharynx is clear. NECK:  Supple.  Full range of motion.  No thyromegaly, adenopathy, or jugular venous distention. CARDIOVASCULAR:  Regular rate and rhythm.  No murmurs, gallops, or rubs appreciated. LUNGS:  Clear to auscultation bilaterally.  No rales, rhonchi, or wheezes. ABDOMEN:  Positive bowel sounds, soft, nontender, and nondistended.  No hepatosplenomegaly.  Obese. EXTREMITIES:  Without cyanosis, clubbing, or edema. NEUROLOGIC:  The patient does have a tremor with intention.  Otherwise, there appear to be no gross focal findings at this.  LABORATORY STUDIES:  White blood cell count 6.4, hemoglobin 11.7, hematocrit 35.1, platelets 225, neutrophils 60% lymphocytes 28%.  Sodium 126, potassium 4.3, chloride 89, carbon dioxide 26, BUN 11, creatinine 0.75, and glucose 101.  Urinalysis negative.  IMAGING:  Chest x-ray reveals cardiomegaly without pulmonary edema and left perihilar atelectasis.  CT scan of the head as mentioned above, negative for any acute findings.  The patient has a left basal ganglia lacunar infarct and diffuse white matter small vessel  ischemic changes, global cortical atrophy and secondary ventriculomegaly, but no evidence of acute intracranial abnormalities.  ASSESSMENT:  A 77 year old female being admitted with 1. Hyponatremia. 2. Metabolic encephalopathy, secondary to hyponatremia. 3. Type 2 diabetes mellitus. 4. Congestive heart failure syndrome history, which is compensated at     this time. 5. Hypertension. 6. Coronary artery disease. 7. Morbid obesity.  PLAN:  The  patient will be admitted to a telemetry area for monitoring. The patient will be placed on IV fluids at this time for gentle rehydration.  Urine electrolytes will not be ordered because the patient had been on torsemide and Aldactone therapies.  Once these medications will be held for now and once they have been held for possibly 24 to 48 hours, urine electrolytes may be ordered and performed.  The patient's hyponatremia more than likely is secondary to her medications.  The patient's electrolytes and BUN and creatinine will be monitored daily. Her medications have been reconciled and sliding scale insulin coverage have been ordered.  Further workup will ensue, pending results of the patient's clinical course.     [name], M.D.     HJ/MEDQ  D:  06/08/2011  T:  06/08/2011  Job:  [id number]  Electronically Signed by [name] M.D. on 06/10/2011 10:00:12 PM

## 2011-06-11 LAB — GLUCOSE, CAPILLARY
Glucose-Capillary: 151 mg/dL — ABNORMAL HIGH (ref 70–99)
Glucose-Capillary: 148 mg/dL — ABNORMAL HIGH (ref 70–99)
Glucose-Capillary: 153 mg/dL — ABNORMAL HIGH (ref 70–99)

## 2011-06-11 LAB — CBC
HCT: 28.4 % — ABNORMAL LOW (ref 36.0–46.0)
MCV: 89.3 fL (ref 78.0–100.0)
RBC: 3.18 MIL/uL — ABNORMAL LOW (ref 3.87–5.11)
WBC: 4.4 10*3/uL (ref 4.0–10.5)

## 2011-06-11 LAB — BASIC METABOLIC PANEL
BUN: 7 mg/dL (ref 6–23)
CO2: 25 mEq/L (ref 19–32)
Chloride: 105 mEq/L (ref 96–112)
Creatinine, Ser: 0.64 mg/dL (ref 0.50–1.10)
Potassium: 4.3 mEq/L (ref 3.5–5.1)

## 2011-06-13 LAB — GLUCOSE, CAPILLARY
Glucose-Capillary: 110 mg/dL — ABNORMAL HIGH (ref 70–99)
Glucose-Capillary: 82 mg/dL (ref 70–99)

## 2011-06-13 NOTE — Discharge Summary (Signed)
[name], [name] NO.:  [id number]  MEDICAL RECORD NO.:  [id number]  LOCATION:  1443                         FACILITY:  [facility]  PHYSICIAN:  Monique Nap, MD  DATE OF BIRTH:  [birth year]  DATE OF ADMISSION:  06/08/2011 DATE OF DISCHARGE:  06/12/2011                        DISCHARGE SUMMARY - REFERRING   DISCHARGING PHYSICIAN:  Monique Nap, MD  PRIMARY CARE PHYSICIAN:  Monique Jenny, MD  [facility] physician involving the case, 1. Monique Pitt, MD 2. Monique Nap, MD (the patient seen by me only for 1 day which     is June 12, 2011).  DISCHARGE DIAGNOSES: 1. Acute confusional state - etiology is unknown. 2. Hyponatremia - corrected. 3. Diabetes mellitus, controlled.  Hemoglobin A1c is 5.1, will     discontinued metformin. 4. Shingles. 5. Postherpetic neuralgia. 6. Anemia. 7. Coronary artery disease, status post CABG, not decompensating. 8. Prior history of cerebrovascular accidents. 9. Tremor - essential versus Parkinson's. 10.History of gout. 11.Deconditioning/bedridden.  The patient is a 77 year old lady who does not speak [place], most likely [place] in origin, was admitted to the hospital on June 08, 2011, by Dr. Della [name] with confusion which was said to have been getting progressively worse in the past 2 weeks.  The patient was said to have been recently treated for UTI, but despite the treatment, confusion was getting worse.  She was also said to have increased weakness.  There is no history of chest pain.  There is no syncopal episode.  No fever.  No chills.  No rigor and subsequently the patient was brought to the hospital for evaluation.  PREADMISSION MEDICATIONS: 1. Colchicine. 2. Folic acid. 3. Imdur. 4. Metformin. 5. Metoprolol. 6. Nexium. 7. Iron. 8. Plavix. 9. Aldactone. 10.Mysoline. 11.Torsemide. 12.Senokot. 13.Vytorin. 14.Glucotrol. 15.Amitiza. 16.Tylenol.  ALLERGIES:   PENICILLIN.  SOCIAL HISTORY:  Negative for alcohol or tobacco use.  FAMILY HISTORY:  Unobtainable.  REVIEW OF SYSTEMS:  Unobtainable.  PHYSICAL EXAMINATION:  GENERAL:  At the time the patient was seen by the admitting physician, she was not in any discomfort. VITAL SIGNS:  Temperature 99.1, blood pressure is 144/57, heart rate 86, respiratory rate 19, saturating 96% on room air. HEENT:  Pallor, but pupils were reactive to light and extraocular muscles are intact. NECK:  No jugular venous distention.  No carotid bruit.  No lymphadenopathy. CHEST:  Clear to auscultation.  Heart sounds are 1 and 2. ABDOMEN:  Soft, nontender.  Liver, spleen, kidney not palpable.  Bowel sounds are positive. EXTREMITIES:  No pedal edema. NEUROLOGIC:  Showed the patient to have trauma, otherwise nonfocal. NEUROPSYCHIATRIC:  Unremarkable. MUSCULOSKELETAL SYSTEM:  Arthritic changes in the knees and feet.  LABORATORY DATA:  Initial complete blood count differential showed WBC of 6.4, hemoglobin 11.7, hematocrit of 35.1, MCV 84.6 with a platelet count of 225, normal differentials.  Cardiac marker, troponin-I less than 0.01.  Comprehensive metabolic panel showed sodium of 126, potassium of 4.3, chloride of 89 with a bicarbonate of 26, glucose is 101, BUN is 11, creatinine 0.75.  LFTs are normal.  Urinalysis unremarkable.  Hemoglobin A1c 5.1.  Urine culture no growth.  A repeat complete blood count with no differential done on June 11, 2011, showed WBC of 4.4, hemoglobin 9.0, hematocrit of 28.4, MCV of 89.3 with a platelet count of 188.  Basic metabolic panel showed sodium of 136, potassium of 4.3, chloride of 105 with a bicarbonate of 25, glucose is 104, BUN is 7, creatinine 0.64.  IMAGING STUDIES DONE:  Chest x-ray 2-view which showed cardiomegaly without pulmonary edema.  There is left perihilar atelectasis.  CT of the head without contrast showed no evidence of acute intracranial abnormality.  MRA  of the head without contrast showed no acute intracranial abnormality.  There is chronic left basal ganglia infarct and generalized cerebral volume loss which has progressed since 2006. There is also chronic severe right ICD site atherosclerosis.  There is irregularity of the anterior communicating artery.  There is chronic posterior circulation atherosclerosis and progression of the distal left vertebral artery atherosclerosis, but no subsequent hemodynamic significant stenosis seen.  HOSPITAL COURSE:  The patient was admitted to Telemetry with an impression of hyponatremia, started on normal saline to go at rate of 75 cc an hour, and she was also given Zofran for nausea and Senokot for constipation.  Other medication given to the patient include Tylenol, oxycodone, and Dilaudid for pain control.  The patient was placed on Accu-Cheks with regular insulin sliding scale.  At time the patient was seen by Dr. Peggye [name], Dilaudid was discontinued, oxycodone was discontinued, and the patient was started on ibuprofen 400 mg p.o. q.8 p.r.n., and Lyrica 75 mg p.o. nightly for questionable postherpetic neuralgia.  Other medication given to the patient include allopurinol 100 mg p.o. daily, Plavix 75 mg p.o. daily, Vytorin 1 p.o. daily, folic acid 1 mg p.o. daily, and Lopressor 25 mg p.o. daily.  GI prophylaxis was done with pantoprazole 80 mg p.o. q.12 and then DVT prophylaxis with Lovenox 40 mg subcutaneous q.24.  The patient was, however, seen by me for the very first time in this admission today which is June 12, 2011, and after full review of the patient's clinical history and the labs, it is my impression that the patient's diabetes is well controlled, hemoglobin A1c is 5.1, and the progressive confusion that had been going on for 2 weeks, most likely could have been secondary to hypoglycemia from the dual use of metformin and Glucotrol.  It also could be secondary to [facility]  phenomenon.  So far, however, the patient has remained clinically stable.  I had an extensive discussion with the patient's daughter in the room as well as another doctor over the phone about need to discontinue oral hypoglycemics and they all verbalized understanding.  The patient was seen by me today, awake, alert, and oriented, not in any distress.  Vital signs, stable, blood pressure is 150/74, temperature is 98.0, pulse 71, respiratory rate 18, medically stable.  Plan is the patient to be discharged home today on activity as tolerated.  She will have advance PT/OT and followed up by her primary care physician in 1-2 weeks.  Lisinopril, however, will be added to the patient's regimen.  MEDICATIONS:  To be taken at home include, 1. Pregabalin (Lyrica) 75 mg 1 p.o. daily at bedtime. 2. Lisinopril 10 mg p.o. b.i.d. 3. Allopurinol 100 mg p.o. daily. 4. Colchicine 0.6 mg 1 p.o. b.i.d. p.r.n. 5. Folic acid 1 mg p.o. daily. 6. Imdur (isosorbide mononitrate) 30 mg 1 p.o. daily. 7. Lubiprostone 24 mcg 1 p.o. b.i.d. 8. Metoprolol tartrate 50 mg one to one half tablet p.o. and that is     25 mg q.a.m.  and 50 mg nightly. 9. Nexium (esomeprazole) 40 mg 1 p.o. daily. 10.Niferex iron-polysaccharide complex 150 mg p.o. b.i.d. 11.Plavix (clopidogrel) 75 mg p.o. daily. 12.Primidone 50 mg p.o. nightly. 13.Senna/docusate 8.6/50 one p.o. b.i.d. 14.Sorbitol 30 cc p.o. b.i.d. p.r.n. 15.Spironolactone 25 mg half a tablet p.o. daily. 16.Torsemide 20 mg 1 p.o. daily. 17.Valacyclovir 1 g p.o. t.i.d. which was started for 7 days on     June 01, 2011, which right now is     discontinued. 18.Vitamin D2 50,000 units 1 p.o. weekly. 19.Vytorin (ezetimibe/simvastatin) 10/20 one p.o. daily.     Monique Nap, MD     CN/MEDQ  D:  06/12/2011  T:  06/12/2011  Job:  [id number]  cc:   Monique Jenny, MD Fax: [phone]  Electronically Signed by [name]  on 06/13/2011 06:01:57 PM

## 2011-09-04 ENCOUNTER — Ambulatory Visit (HOSPITAL_BASED_OUTPATIENT_CLINIC_OR_DEPARTMENT_OTHER): Payer: Medicare Other | Admitting: Nurse Practitioner

## 2011-09-04 ENCOUNTER — Telehealth: Payer: Self-pay | Admitting: Oncology

## 2011-09-04 ENCOUNTER — Other Ambulatory Visit: Payer: Self-pay | Admitting: Oncology

## 2011-09-04 ENCOUNTER — Other Ambulatory Visit (HOSPITAL_BASED_OUTPATIENT_CLINIC_OR_DEPARTMENT_OTHER): Payer: Medicare Other | Admitting: Lab

## 2011-09-04 ENCOUNTER — Other Ambulatory Visit: Payer: Self-pay | Admitting: *Deleted

## 2011-09-04 VITALS — BP 121/67 | HR 67 | Temp 97.4°F

## 2011-09-04 DIAGNOSIS — D509 Iron deficiency anemia, unspecified: Secondary | ICD-10-CM

## 2011-09-04 DIAGNOSIS — D649 Anemia, unspecified: Secondary | ICD-10-CM

## 2011-09-04 LAB — CBC WITH DIFFERENTIAL/PLATELET
BASO%: 0.1 % (ref 0.0–2.0)
Basophils Absolute: 0 10*3/uL (ref 0.0–0.1)
EOS%: 1.8 % (ref 0.0–7.0)
HCT: 31.5 % — ABNORMAL LOW (ref 34.8–46.6)
HGB: 10.3 g/dL — ABNORMAL LOW (ref 11.6–15.9)
MCH: 27.8 pg (ref 25.1–34.0)
MCHC: 32.8 g/dL (ref 31.5–36.0)
MCV: 84.7 fL (ref 79.5–101.0)
MONO%: 8.3 % (ref 0.0–14.0)
NEUT%: 73.5 % (ref 38.4–76.8)
lymph#: 1.2 10*3/uL (ref 0.9–3.3)

## 2011-09-04 NOTE — Telephone Encounter (Signed)
gve the pt her march 2013 appt calendar 

## 2011-09-05 NOTE — Progress Notes (Signed)
OFFICE PROGRESS NOTE  Interval history:  Ms. Equihua is a 77 year old woman with a history of a microcytic anemia. She is seen today for scheduled followup. She is accompanied by her daughter who serves as the interpreter.  Ms. Komperda overall feels well. No interim illnesses or infections. She has a good appetite. Her daughter reports she has a good energy level. She spends the majority of her time in a wheelchair. She has had no bleeding.   Objective: Blood pressure 121/67, pulse 67, temperature 97.4 F (36.3 C), temperature source Oral.  Oropharynx is without thrush. Lungs are clear. Regular cardiac rhythm. Abdomen is soft and nontender. Full abdominal exam could not be completed as she was unable to maneuver onto the exam table. No obvious organomegaly. Extremities are without edema.   Lab Results: Lab Results  Component Value Date   WBC 7.5 09/04/2011   HGB 10.3* 09/04/2011   HCT 31.5* 09/04/2011   MCV 84.7 09/04/2011   PLT 168 09/04/2011    Chemistry:      Component Value Date/Time   NA 136 06/11/2011 0530   K 4.3 06/11/2011 0530   CL 105 06/11/2011 0530   CO2 25 06/11/2011 0530   GLUCOSE 104* 06/11/2011 0530   BUN 7 06/11/2011 0530   CREATININE 0.64 06/11/2011 0530   CALCIUM 8.9 06/11/2011 0530   PROT 6.8 06/08/2011 1742   ALBUMIN 3.6 06/08/2011 1742   AST 19 06/08/2011 1742   ALT 15 06/08/2011 1742   ALKPHOS 93 06/08/2011 1742   BILITOT 0.5 06/08/2011 1742   GFRNONAA >60 06/11/2011 0530   GFRAA >60 06/11/2011 0530     Studies/Results: No results found.  Medications: I have reviewed the patient's current medications.  Assessment/Plan:  1. History of a microcytic anemia.  The MCV remains in the normal range.  The hemoglobin is stable today. 2. History of coronary artery disease. 3. "Gout." 4. "Giant" platelets on the blood smear from 01/29/2011. 5. 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  She is unable to ambulate. 6. Zoster rash at the left lower back and chest when  here on 06/05/2011.  She completed a course of Valtrex.  Disposition-Mrs. Mcguirk appears stable from a hematologic standpoint. She will return for labs to include CBC, SPEP, basic metabolic panel and serum light chains and a followup visit in 3 months. Her family will contact the office in the interim with any problems.  Plan reviewed with Dr. Derenda Fennel, Misty Stanley ANP/GNP-BC

## 2011-09-12 ENCOUNTER — Other Ambulatory Visit: Payer: Self-pay

## 2011-09-12 ENCOUNTER — Inpatient Hospital Stay (HOSPITAL_COMMUNITY)
Admission: EM | Admit: 2011-09-12 | Discharge: 2011-09-16 | DRG: 689 | Disposition: A | Discharge status: Home / Self Care | Payer: Medicare Other | Attending: Internal Medicine | Admitting: Internal Medicine

## 2011-09-12 ENCOUNTER — Emergency Department (HOSPITAL_COMMUNITY): Payer: Medicare Other

## 2011-09-12 ENCOUNTER — Encounter (HOSPITAL_COMMUNITY): Payer: Self-pay | Admitting: *Deleted

## 2011-09-12 DIAGNOSIS — I10 Essential (primary) hypertension: Secondary | ICD-10-CM | POA: Diagnosis present

## 2011-09-12 DIAGNOSIS — J811 Chronic pulmonary edema: Secondary | ICD-10-CM | POA: Diagnosis present

## 2011-09-12 DIAGNOSIS — D696 Thrombocytopenia, unspecified: Secondary | ICD-10-CM | POA: Diagnosis present

## 2011-09-12 DIAGNOSIS — I509 Heart failure, unspecified: Secondary | ICD-10-CM | POA: Diagnosis present

## 2011-09-12 DIAGNOSIS — K219 Gastro-esophageal reflux disease without esophagitis: Secondary | ICD-10-CM | POA: Diagnosis present

## 2011-09-12 DIAGNOSIS — D649 Anemia, unspecified: Secondary | ICD-10-CM

## 2011-09-12 DIAGNOSIS — R4182 Altered mental status, unspecified: Secondary | ICD-10-CM | POA: Diagnosis present

## 2011-09-12 DIAGNOSIS — E119 Type 2 diabetes mellitus without complications: Secondary | ICD-10-CM | POA: Diagnosis present

## 2011-09-12 DIAGNOSIS — N390 Urinary tract infection, site not specified: Principal | ICD-10-CM | POA: Diagnosis present

## 2011-09-12 DIAGNOSIS — R509 Fever, unspecified: Secondary | ICD-10-CM

## 2011-09-12 DIAGNOSIS — I5022 Chronic systolic (congestive) heart failure: Secondary | ICD-10-CM

## 2011-09-12 DIAGNOSIS — E785 Hyperlipidemia, unspecified: Secondary | ICD-10-CM

## 2011-09-12 DIAGNOSIS — I5023 Acute on chronic systolic (congestive) heart failure: Secondary | ICD-10-CM | POA: Diagnosis present

## 2011-09-12 DIAGNOSIS — E871 Hypo-osmolality and hyponatremia: Secondary | ICD-10-CM | POA: Diagnosis present

## 2011-09-12 DIAGNOSIS — D509 Iron deficiency anemia, unspecified: Secondary | ICD-10-CM | POA: Diagnosis present

## 2011-09-12 HISTORY — DX: Cerebral infarction, unspecified: I63.9

## 2011-09-12 LAB — DIFFERENTIAL
Basophils Absolute: 0 10*3/uL (ref 0.0–0.1)
Eosinophils Relative: 0 % (ref 0–5)
Lymphocytes Relative: 13 % (ref 12–46)
Lymphs Abs: 0.9 10*3/uL (ref 0.7–4.0)
Monocytes Relative: 19 % — ABNORMAL HIGH (ref 3–12)
Neutro Abs: 4.5 10*3/uL (ref 1.7–7.7)

## 2011-09-12 LAB — CBC
HCT: 31.8 % — ABNORMAL LOW (ref 36.0–46.0)
MCV: 82 fL (ref 78.0–100.0)
Platelets: 141 10*3/uL — ABNORMAL LOW (ref 150–400)
RBC: 3.88 MIL/uL (ref 3.87–5.11)
RDW: 19.1 % — ABNORMAL HIGH (ref 11.5–15.5)
WBC: 6.7 10*3/uL (ref 4.0–10.5)

## 2011-09-12 LAB — COMPREHENSIVE METABOLIC PANEL
ALT: 16 U/L (ref 0–35)
Albumin: 3.2 g/dL — ABNORMAL LOW (ref 3.5–5.2)
Alkaline Phosphatase: 109 U/L (ref 39–117)
Chloride: 92 mEq/L — ABNORMAL LOW (ref 96–112)
Potassium: 3.7 mEq/L (ref 3.5–5.1)
Sodium: 132 mEq/L — ABNORMAL LOW (ref 135–145)
Total Bilirubin: 0.4 mg/dL (ref 0.3–1.2)
Total Protein: 7 g/dL (ref 6.0–8.3)

## 2011-09-12 LAB — URINALYSIS, ROUTINE W REFLEX MICROSCOPIC
Glucose, UA: NEGATIVE mg/dL
Ketones, ur: NEGATIVE mg/dL
Protein, ur: 30 mg/dL — AB

## 2011-09-12 LAB — URINE MICROSCOPIC-ADD ON

## 2011-09-12 LAB — OCCULT BLOOD, POC DEVICE: Fecal Occult Bld: NEGATIVE

## 2011-09-12 MED ORDER — EZETIMIBE-SIMVASTATIN 10-20 MG PO TABS
1.0000 | ORAL_TABLET | Freq: Every day | ORAL | Status: DC
  Administered 2011-09-12 – 2011-09-15 (×4): 1 via ORAL
  Filled 2011-09-12 (×5): qty 1

## 2011-09-12 MED ORDER — ISOSORBIDE MONONITRATE ER 30 MG PO TB24
30.0000 mg | ORAL_TABLET | Freq: Every day | ORAL | Status: DC
  Administered 2011-09-13 – 2011-09-16 (×4): 30 mg via ORAL
  Filled 2011-09-12 (×5): qty 1

## 2011-09-12 MED ORDER — CLOPIDOGREL BISULFATE 75 MG PO TABS
75.0000 mg | ORAL_TABLET | Freq: Every day | ORAL | Status: DC
  Administered 2011-09-13 – 2011-09-16 (×4): 75 mg via ORAL
  Filled 2011-09-12 (×5): qty 1

## 2011-09-12 MED ORDER — SENNA 8.6 MG PO TABS
1.0000 | ORAL_TABLET | Freq: Two times a day (BID) | ORAL | Status: DC
  Administered 2011-09-12 – 2011-09-16 (×8): 8.6 mg via ORAL
  Filled 2011-09-12 (×10): qty 1

## 2011-09-12 MED ORDER — ONDANSETRON HCL 4 MG/2ML IJ SOLN: 4.0000 mg | Freq: Four times a day (QID) | INTRAMUSCULAR | Status: DC | PRN

## 2011-09-12 MED ORDER — SODIUM CHLORIDE 0.9 % IV SOLN: INTRAVENOUS | Status: DC

## 2011-09-12 MED ORDER — ACETAMINOPHEN 650 MG RE SUPP
650.0000 mg | Freq: Once | RECTAL | Status: AC
  Administered 2011-09-12: 650 mg via RECTAL
  Filled 2011-09-12: qty 1

## 2011-09-12 MED ORDER — ALLOPURINOL 100 MG PO TABS
100.0000 mg | ORAL_TABLET | Freq: Every day | ORAL | Status: DC
  Administered 2011-09-12 – 2011-09-16 (×5): 100 mg via ORAL
  Filled 2011-09-12 (×5): qty 1

## 2011-09-12 MED ORDER — ENOXAPARIN SODIUM 40 MG/0.4ML ~~LOC~~ SOLN
40.0000 mg | SUBCUTANEOUS | Status: DC
  Administered 2011-09-13 – 2011-09-14 (×2): 40 mg via SUBCUTANEOUS
  Filled 2011-09-12 (×4): qty 0.4

## 2011-09-12 MED ORDER — ACETAMINOPHEN 500 MG PO TABS: 1000.0000 mg | ORAL_TABLET | Freq: Four times a day (QID) | ORAL | Status: DC | PRN

## 2011-09-12 MED ORDER — ONDANSETRON HCL 4 MG PO TABS: 4.0000 mg | ORAL_TABLET | Freq: Four times a day (QID) | ORAL | Status: DC | PRN

## 2011-09-12 MED ORDER — METOPROLOL TARTRATE 50 MG PO TABS
75.0000 mg | ORAL_TABLET | Freq: Every day | ORAL | Status: DC
  Administered 2011-09-13 – 2011-09-14 (×2): 75 mg via ORAL
  Filled 2011-09-12: qty 1
  Filled 2011-09-12: qty 3
  Filled 2011-09-12: qty 1

## 2011-09-12 MED ORDER — PRIMIDONE 50 MG PO TABS
50.0000 mg | ORAL_TABLET | Freq: Every day | ORAL | Status: DC
  Administered 2011-09-13 – 2011-09-16 (×4): 50 mg via ORAL
  Filled 2011-09-12 (×5): qty 1

## 2011-09-12 MED ORDER — LEVOFLOXACIN IN D5W 750 MG/150ML IV SOLN
750.0000 mg | INTRAVENOUS | Status: DC
  Administered 2011-09-12 – 2011-09-15 (×4): 750 mg via INTRAVENOUS
  Filled 2011-09-12 (×5): qty 150

## 2011-09-12 MED ORDER — COLCHICINE 0.6 MG PO TABS
0.6000 mg | ORAL_TABLET | Freq: Two times a day (BID) | ORAL | Status: DC | PRN
  Administered 2011-09-13: 0.6 mg via ORAL
  Filled 2011-09-12: qty 1

## 2011-09-12 MED ORDER — PREGABALIN 75 MG PO CAPS
75.0000 mg | ORAL_CAPSULE | Freq: Every day | ORAL | Status: DC
  Administered 2011-09-12 – 2011-09-16 (×5): 75 mg via ORAL
  Filled 2011-09-12 (×5): qty 1

## 2011-09-12 MED ORDER — PANTOPRAZOLE SODIUM 40 MG PO TBEC
40.0000 mg | DELAYED_RELEASE_TABLET | Freq: Every day | ORAL | Status: DC
  Administered 2011-09-13 – 2011-09-16 (×4): 40 mg via ORAL
  Filled 2011-09-12 (×5): qty 1

## 2011-09-12 MED ORDER — SPIRONOLACTONE 12.5 MG HALF TABLET
12.5000 mg | ORAL_TABLET | Freq: Every day | ORAL | Status: DC
  Administered 2011-09-13 – 2011-09-16 (×4): 12.5 mg via ORAL
  Filled 2011-09-12 (×5): qty 1

## 2011-09-12 MED ORDER — TORSEMIDE 20 MG PO TABS
20.0000 mg | ORAL_TABLET | Freq: Every day | ORAL | Status: DC
  Administered 2011-09-13 – 2011-09-16 (×4): 20 mg via ORAL
  Filled 2011-09-12 (×5): qty 1

## 2011-09-12 NOTE — ED Notes (Signed)
WUJ:WJXB<JY> Expected date:09/12/11<BR> Expected time: 5:27 PM<BR> Means of arrival:<BR> Comments:<BR> ALOC

## 2011-09-12 NOTE — ED Notes (Signed)
Daughter's number [phone]

## 2011-09-12 NOTE — ED Provider Notes (Addendum)
History     CSN: [id number] Arrival date & time: 09/12/2011  5:42 PM   First MD Initiated Contact with Patient 09/12/11 1755      Chief Complaint  Patient presents with  . Altered Mental Status  . Fever   Level 5 Caveat due to altered mental status  HPI  79yoF history of non-insulin-dependent diabetes, hypertension, CVA with residual right-sided weakness presents with altered mental status. As you and physical exam are limited by language barrier as well as altered mental status. Per the family the patient has experienced cough and chills since yesterday. She began to feel little confused last night but woke up this morning with worsening mental status. She is alert and cough but repeating her daughter's statements. She remains ambulatory. The patient states that she has no complaints at this time. This permission was obtained through her daughter who is talking to her in arabic. She states that there is one sick contact at home the patient's husband who is vomiting and had a ?fever recently.    Past Medical History  Diagnosis Date  . Diabetes mellitus   . Hypertension   . Stroke Right Side Weakness    No past surgical history on file.  No family history on file.  History  Substance Use Topics  . Smoking status: Not on file  . Smokeless tobacco: Not on file  . Alcohol Use:     OB History    Grav Para Term Preterm Abortions TAB SAB Ect Mult Living                  Review of Systems  Unable to perform ROS  2/2 ams  Allergies  Penicillins  Home Medications   Current Outpatient Rx  Name Route Sig Dispense Refill  . ACETAMINOPHEN 500 MG PO TABS Oral Take 1,000 mg by mouth every 6 (six) hours as needed. pain     . ALLOPURINOL 100 MG PO TABS Oral Take 100 mg by mouth daily.      Marland Kitchen CLOPIDOGREL BISULFATE 75 MG PO TABS Oral Take 75 mg by mouth daily.      . COLCHICINE 0.6 MG PO TABS Oral Take 0.6 mg by mouth 2 (two) times daily as needed.      Marland Kitchen ESOMEPRAZOLE  MAGNESIUM 40 MG PO CPDR Oral Take 40 mg by mouth daily before breakfast.      . EZETIMIBE-SIMVASTATIN 10-20 MG PO TABS Oral Take 1 tablet by mouth at bedtime.      Marland Kitchen FOLIC ACID 1 MG PO TABS Oral Take 1 mg by mouth daily.      . ISOSORBIDE MONONITRATE ER 30 MG PO TB24 Oral Take 30 mg by mouth daily.      Marland Kitchen METOPROLOL TARTRATE 50 MG PO TABS Oral Take 75 mg by mouth daily.      Marland Kitchen POLYSACCHARIDE IRON 150 MG PO CAPS Oral Take 150 mg by mouth 2 (two) times daily.      Marland Kitchen PREGABALIN 75 MG PO CAPS Oral Take 75 mg by mouth daily.      Marland Kitchen PRIMIDONE 50 MG PO TABS Oral Take 50 mg by mouth daily.      . SENNOSIDES 8.6 MG PO TABS Oral Take 1 tablet by mouth 2 (two) times daily.      Marland Kitchen SPIRONOLACTONE 25 MG PO TABS Oral Take 12.5 mg by mouth daily.      . TORSEMIDE 20 MG PO TABS Oral Take 20 mg by mouth daily.  BP 121/72  Pulse 90  Temp(Src) 99.3 F (37.4 C) (Oral)  Resp 24  SpO2 99%  Physical Exam  Nursing note and vitals reviewed. Constitutional: She appears well-developed.  HENT:  Head: Atraumatic.  Mouth/Throat: Oropharynx is clear and moist.  Eyes: Conjunctivae and EOM are normal. Pupils are equal, round, and reactive to light.  Neck: Normal range of motion. Neck supple.  Cardiovascular: Normal rate, regular rhythm, normal heart sounds and intact distal pulses.   Pulmonary/Chest: Effort normal and breath sounds normal. No respiratory distress. She has no wheezes. She has no rales.  Abdominal: Soft. She exhibits no distension. There is no tenderness. There is no rebound and no guarding.  Musculoskeletal: Normal range of motion. She exhibits edema. She exhibits no tenderness.       B/l LE edema (per daughter chronic)  Neurological: She is alert. No cranial nerve deficit. She exhibits normal muscle tone. Coordination normal.       Oriented x 1  Skin: Skin is warm and dry. No rash noted.  Psychiatric: She has a normal mood and affect.    Date: 09/13/2011  Rate: 91  Rhythm: normal sinus  rhythm  QRS Axis: normal  Intervals: PR prolonged  ST/T Wave abnormalities: nonspecific T wave changes  Conduction Disutrbances:first-degree A-V block   Narrative Interpretation:   Old EKG Reviewed: changes noted  ED Course  Procedures (including critical care time)  Labs Reviewed  CBC - Abnormal; Notable for the following:    Hemoglobin 10.2 (*)    HCT 31.8 (*)    RDW 19.1 (*)    Platelets 141 (*) SPECIMEN CHECKED FOR CLOTS   All other components within normal limits  DIFFERENTIAL - Abnormal; Notable for the following:    Monocytes Relative 19 (*)    Monocytes Absolute 1.3 (*)    All other components within normal limits  COMPREHENSIVE METABOLIC PANEL - Abnormal; Notable for the following:    Sodium 132 (*)    Chloride 92 (*)    Glucose, Bld 102 (*)    BUN 24 (*)    Albumin 3.2 (*)    GFR calc non Af Amer 50 (*)    GFR calc Af Amer 59 (*)    All other components within normal limits  URINALYSIS, ROUTINE W REFLEX MICROSCOPIC - Abnormal; Notable for the following:    APPearance TURBID (*)    Hgb urine dipstick LARGE (*)    Protein, ur 30 (*)    Leukocytes, UA LARGE (*)    All other components within normal limits  URINE MICROSCOPIC-ADD ON - Abnormal; Notable for the following:    Bacteria, UA FEW (*)    All other components within normal limits  LACTIC ACID, PLASMA  OCCULT BLOOD, POC DEVICE  GLUCOSE, CAPILLARY  URINE CULTURE  CULTURE, BLOOD (ROUTINE X 2)  CULTURE, BLOOD (ROUTINE X 2)  POCT CBG MONITORING   Dg Chest 2 View  09/12/2011  *RADIOLOGY REPORT*  Clinical Data: Altered mental status.  Fever and cough  CHEST - 2 VIEW  Comparison: 06/08/2011  Findings: Changes of CABG.  Mild cardiac enlargement.  Mild vascular congestion.  Negative for edema or effusion.  Mild bibasilar atelectasis.  No definite pneumonia.  IMPRESSION: Cardiac enlargement with mild vascular congestion.  Mild bibasilar atelectasis.  Original Report Authenticated By: Camelia Phenes, M.D.      1. UTI (lower urinary tract infection)   2. Fever   3. Altered mental status     MDM  The patient  presents with fever and confusion. She is alert. Her fever is resolved after rectal Tylenol. Her blood pressure and other vital signs have remained stable. She has a urinary tract infection. Urine culture sent. I did not suspect meningitis or encephalitis. Her chest x-ray is negative for pneumonia. This is unlikely an intra-abdominal process and she has no rash. I was called to the bedside for patient with a large black bowel movement. Her daughter states that she does take iron for her chronic anemia. Her hemoglobin is at baseline and her Hemoccult is negative. Ordered Levaquin for her urinary tract infection. She does take Plavix but per the daughter there has been no recent falls. I do not suspect an acute intracranial hemorrhage.  Discuss admission with hospitalist.        Forbes Cellar, MD 09/12/11 1610  Forbes Cellar, MD 09/13/11 [phone]

## 2011-09-12 NOTE — ED Notes (Signed)
Pt is from home with family; fm states that pt has had a decrease in level of conciousness and has not been herself since yesterday and also fevers. EMS temp 99.9

## 2011-09-12 NOTE — H&P (Signed)
PCP:  Florentina Jenny, MD   DOA:  09/12/2011  5:42 PM  Chief Complaint:  Change in mental status  HPI:  Pt is 77 yo female with PMH outlined below who presents to [facility] with main concern per pt's daughter of change in mental status tha was initially noted day prior to admission. DUe to language barrier this history was mostly obtained from ED physician who has gotten the history from daughter and she was not available when I have arrived to the pt's room. Per report, pt has been having fevers, and chills, and appeared confused at times, occasional nonproductive cough and poor oral intake. Per daughter pt has remained ambulatory and has not specified any concerns over the few days prior to admission. Pt speaks arabic. Pt's daughter is not aware of any specific abdominal or urinary concerns at this time.   Allergies: Allergies  Allergen Reactions  . Penicillins Itching and Swelling    Prior to Admission medications   Medication Sig Start Date End Date Taking? Authorizing Provider  acetaminophen (TYLENOL) 500 MG tablet Take 1,000 mg by mouth every 6 (six) hours as needed. pain    Yes Historical Provider, MD  allopurinol (ZYLOPRIM) 100 MG tablet Take 100 mg by mouth daily.     Yes Historical Provider, MD  clopidogrel (PLAVIX) 75 MG tablet Take 75 mg by mouth daily.     Yes Historical Provider, MD  colchicine 0.6 MG tablet Take 0.6 mg by mouth 2 (two) times daily as needed.     Yes Historical Provider, MD  esomeprazole (NEXIUM) 40 MG capsule Take 40 mg by mouth daily before breakfast.     Yes Historical Provider, MD  ezetimibe-simvastatin (VYTORIN) 10-20 MG per tablet Take 1 tablet by mouth at bedtime.     Yes Historical Provider, MD  folic acid (FOLVITE) 1 MG tablet Take 1 mg by mouth daily.     Yes Historical Provider, MD  isosorbide mononitrate (IMDUR) 30 MG 24 hr tablet Take 30 mg by mouth daily.     Yes Historical Provider, MD  metoprolol (LOPRESSOR) 50 MG tablet Take 75 mg by mouth daily.      Yes Historical Provider, MD  polysaccharide iron (NIFEREX) 150 MG CAPS capsule Take 150 mg by mouth 2 (two) times daily.     Yes Historical Provider, MD  pregabalin (LYRICA) 75 MG capsule Take 75 mg by mouth daily.     Yes Historical Provider, MD  primidone (MYSOLINE) 50 MG tablet Take 50 mg by mouth daily.     Yes Historical Provider, MD  senna (SENOKOT) 8.6 MG tablet Take 1 tablet by mouth 2 (two) times daily.     Yes Historical Provider, MD  spironolactone (ALDACTONE) 25 MG tablet Take 12.5 mg by mouth daily.     Yes Historical Provider, MD  torsemide (DEMADEX) 20 MG tablet Take 20 mg by mouth daily.     Yes Historical Provider, MD    Past Medical History  Diagnosis Date  . Diabetes mellitus   . Hypertension   . Stroke Right Side Weakness    No past surgical history on file.  Social History:  does not have a smoking history on file. She does not have any smokeless tobacco history on file. Her alcohol and drug histories not on file.  No family history on file.  Review of Systems:  Limited due to language barrier Please HPI   Physical Exam:  Filed Vitals:   09/12/11 1945 09/12/11 2000 09/12/11 2015 09/12/11 2030  BP: 130/62 137/65 137/62 121/72  Pulse: 90     Temp:      TempSrc:      Resp: 22 29 28 24   SpO2: 99%       Constitutional: Vital signs reviewed.  Patient is a well-developed and well-nourished in no acute distress and cooperative with exam. Head: Normocephalic and atraumatic Ear: TM normal bilaterally Mouth: no erythema or exudates, MMM Eyes: PERRL, EOMI, conjunctivae normal, No scleral icterus.  Neck: Supple, Trachea midline normal ROM, No JVD, mass, thyromegaly, or carotid bruit present.  Cardiovascular: RRR, S1 normal, S2 normal, no MRG, pulses symmetric and intact bilaterally Pulmonary/Chest: CTAB with bibasilar crackles, no wheezes, rales, or rhonchi Abdominal: Soft. Non-tender, non-distended, bowel sounds are normal, no masses, organomegaly, or guarding  present.  Musculoskeletal: No joint deformities, erythema, or stiffness, ROM full and no nontender Ext: Trace bilateral pitting edema, no cyanosis, pulses palpable bilaterally (DP and PT) Skin: Warm, dry and intact. No rash, cyanosis, or clubbing.  Neuro: Right sided weakness in upper and lower extremity compared to the left side, no other focal deficits noted, no facial asymmetry  Labs on Admission:  Results for orders placed during the hospital encounter of 09/12/11 (from the past 48 hour(s))  CBC     Status: Abnormal   Collection Time   09/12/11  6:10 PM      Component Value Range Comment   WBC 6.7  4.0 - 10.5 (K/uL)    RBC 3.88  3.87 - 5.11 (MIL/uL)    Hemoglobin 10.2 (*) 12.0 - 15.0 (g/dL)    HCT 04.5 (*) 40.9 - 46.0 (%)    MCV 82.0  78.0 - 100.0 (fL)    MCH 26.3  26.0 - 34.0 (pg)    MCHC 32.1  30.0 - 36.0 (g/dL)    RDW 81.1 (*) 91.4 - 15.5 (%)    Platelets 141 (*) 150 - 400 (K/uL) SPECIMEN CHECKED FOR CLOTS  DIFFERENTIAL     Status: Abnormal   Collection Time   09/12/11  6:10 PM      Component Value Range Comment   Neutrophils Relative 68  43 - 77 (%)    Lymphocytes Relative 13  12 - 46 (%)    Monocytes Relative 19 (*) 3 - 12 (%)    Eosinophils Relative 0  0 - 5 (%)    Basophils Relative 0  0 - 1 (%)    Neutro Abs 4.5  1.7 - 7.7 (K/uL)    Lymphs Abs 0.9  0.7 - 4.0 (K/uL)    Monocytes Absolute 1.3 (*) 0.1 - 1.0 (K/uL)    Eosinophils Absolute 0.0  0.0 - 0.7 (K/uL)    Basophils Absolute 0.0  0.0 - 0.1 (K/uL)    RBC Morphology STOMATOCYTES   POLYCHROMASIA PRESENT   Smear Review PLATELET COUNT CONFIRMED BY SMEAR     COMPREHENSIVE METABOLIC PANEL     Status: Abnormal   Collection Time   09/12/11  6:10 PM      Component Value Range Comment   Sodium 132 (*) 135 - 145 (mEq/L)    Potassium 3.7  3.5 - 5.1 (mEq/L)    Chloride 92 (*) 96 - 112 (mEq/L)    CO2 30  19 - 32 (mEq/L)    Glucose, Bld 102 (*) 70 - 99 (mg/dL)    BUN 24 (*) 6 - 23 (mg/dL)    Creatinine, Ser 7.82  0.50 -  1.10 (mg/dL)    Calcium 9.1  8.4 - 10.5 (  mg/dL)    Total Protein 7.0  6.0 - 8.3 (g/dL)    Albumin 3.2 (*) 3.5 - 5.2 (g/dL)    AST 21  0 - 37 (U/L)    ALT 16  0 - 35 (U/L)    Alkaline Phosphatase 109  39 - 117 (U/L)    Total Bilirubin 0.4  0.3 - 1.2 (mg/dL)    GFR calc non Af Amer 50 (*) >90 (mL/min)    GFR calc Af Amer 59 (*) >90 (mL/min)   LACTIC ACID, PLASMA     Status: Normal   Collection Time   09/12/11  6:10 PM      Component Value Range Comment   Lactic Acid, Venous 1.9  0.5 - 2.2 (mmol/L)   URINALYSIS, ROUTINE W REFLEX MICROSCOPIC     Status: Abnormal   Collection Time   09/12/11  7:15 PM      Component Value Range Comment   Color, Urine YELLOW  YELLOW     APPearance TURBID (*) CLEAR     Specific Gravity, Urine 1.010  1.005 - 1.030     pH 6.0  5.0 - 8.0     Glucose, UA NEGATIVE  NEGATIVE (mg/dL)    Hgb urine dipstick LARGE (*) NEGATIVE     Bilirubin Urine NEGATIVE  NEGATIVE     Ketones, ur NEGATIVE  NEGATIVE (mg/dL)    Protein, ur 30 (*) NEGATIVE (mg/dL)    Urobilinogen, UA 0.2  0.0 - 1.0 (mg/dL)    Nitrite NEGATIVE  NEGATIVE     Leukocytes, UA LARGE (*) NEGATIVE    URINE MICROSCOPIC-ADD ON     Status: Abnormal   Collection Time   09/12/11  7:15 PM      Component Value Range Comment   WBC, UA TOO NUMEROUS TO COUNT  <3 (WBC/hpf)    RBC / HPF 3-6  <3 (RBC/hpf)    Bacteria, UA FEW (*) RARE     Urine-Other MICROSCOPIC EXAM PERFORMED ON UNCONCENTRATED URINE   LESS THAN 10 mL OF URINE SUBMITTED  GLUCOSE, CAPILLARY     Status: Normal   Collection Time   09/12/11  7:19 PM      Component Value Range Comment   Glucose-Capillary 95  70 - 99 (mg/dL)   OCCULT BLOOD, POC DEVICE     Status: Normal   Collection Time   09/12/11  7:21 PM      Component Value Range Comment   Fecal Occult Bld NEGATIVE       Radiological Exams on Admission: 09/12/2011 - CXR Mild pulmonary vascular congestion  Assessment/Plan  1. ALTERED MENTAL STATUS - unclear etiology but differential  includes PNA vs UTI most likely - certainly worrisome for ACS or CVA but in the absence of chest pain or shortness of breath per daughter I would argue most likely secondary to one of the above, pt does have history of stroke - pt is currently hemodynamically stable, vitals are within normal limits - will admit to regular floor and will monitor vitals per floor protocol - follow up on urine cultures and start Levaquin for now to treat for UTI - certainly worrisome for PNA but CXR was not suggestive of it, will continue Levaquin as noted above - follow up on electrolyte panel in AM - obtain CBC in AM  2. ? CHF EXACERBATION - no recent 2 D ECHO available and CXR was suggestive of mild pulmonary vascular congestion - obtain BNP for now and hold off on iVF, continue  torsemide for now - obtain 2 D ECHO - follow up on I's and O's  3. DIABETES - appears well controlled of insulin - will check A1C  4. HYPONATREMIA - possibly related to volume status - will follow up on BNP - please note that pt is hyponatremic at baseline with Na ~ 130's  5. DISPOSITION  - plan of care and diagnosis, diagnostic studies and test results were discussed with pt and family at bedside - pt and  family verbalized understanding  DVT - Lovenox GERD - Protonix  Time Spent on Admission: Over 30 minute  MAGICK-Makynzee Tigges 09/12/2011, 9:06 PM  [facility] Pager [phone]

## 2011-09-13 ENCOUNTER — Encounter (HOSPITAL_COMMUNITY): Payer: Self-pay

## 2011-09-13 DIAGNOSIS — E119 Type 2 diabetes mellitus without complications: Secondary | ICD-10-CM | POA: Diagnosis present

## 2011-09-13 DIAGNOSIS — R4182 Altered mental status, unspecified: Secondary | ICD-10-CM | POA: Diagnosis present

## 2011-09-13 DIAGNOSIS — E871 Hypo-osmolality and hyponatremia: Secondary | ICD-10-CM | POA: Diagnosis present

## 2011-09-13 DIAGNOSIS — J811 Chronic pulmonary edema: Secondary | ICD-10-CM | POA: Diagnosis present

## 2011-09-13 LAB — HEMOGLOBIN A1C
Hgb A1c MFr Bld: 5.9 % — ABNORMAL HIGH (ref <5.7)
Mean Plasma Glucose: 123 mg/dL — ABNORMAL HIGH (ref <117)

## 2011-09-13 LAB — BASIC METABOLIC PANEL
BUN: 28 mg/dL — ABNORMAL HIGH (ref 6–23)
CO2: 31 mEq/L (ref 19–32)
Calcium: 9.4 mg/dL (ref 8.4–10.5)
Chloride: 93 mEq/L — ABNORMAL LOW (ref 96–112)
Creatinine, Ser: 1.15 mg/dL — ABNORMAL HIGH (ref 0.50–1.10)
GFR calc Af Amer: 51 mL/min — ABNORMAL LOW (ref >90)
GFR calc non Af Amer: 44 mL/min — ABNORMAL LOW (ref >90)
Glucose, Bld: 93 mg/dL (ref 70–99)
Potassium: 4 mEq/L (ref 3.5–5.1)
Sodium: 133 mEq/L — ABNORMAL LOW (ref 135–145)

## 2011-09-13 LAB — CBC
HCT: 32 % — ABNORMAL LOW (ref 36.0–46.0)
Hemoglobin: 10.2 g/dL — ABNORMAL LOW (ref 12.0–15.0)
MCH: 26.6 pg (ref 26.0–34.0)
MCHC: 31.9 g/dL (ref 30.0–36.0)
MCV: 83.3 fL (ref 78.0–100.0)
Platelets: 122 10*3/uL — ABNORMAL LOW (ref 150–400)
RBC: 3.84 MIL/uL — ABNORMAL LOW (ref 3.87–5.11)
RDW: 19.1 % — ABNORMAL HIGH (ref 11.5–15.5)
WBC: 5.5 10*3/uL (ref 4.0–10.5)

## 2011-09-13 LAB — GLUCOSE, CAPILLARY: Glucose-Capillary: 83 mg/dL (ref 70–99)

## 2011-09-13 MED ORDER — BIOTENE DRY MOUTH MT LIQD
15.0000 mL | Freq: Two times a day (BID) | OROMUCOSAL | Status: DC
  Administered 2011-09-13 – 2011-09-15 (×4): 15 mL via OROMUCOSAL

## 2011-09-13 NOTE — Progress Notes (Signed)
Subjective: Sleepy but feels better today per discussion with daughter at the bedside.  Objective: Weight change:  No intake or output data in the 24 hours ending 09/13/11 1806 General: Patient appears her stated age. HEENT: Head normocephalic atraumatic. Cardiovascular: Regular rate rhythm. Lungs: Clear to auscultation bilaterally. Extremities: 2+ edema bilaterally.  Lab Results: Reviewed  Micro Results: No results found for this or any previous visit (from the past 240 hour(s)).  Studies/Results: Dg Chest 2 View  09/12/2011  *RADIOLOGY REPORT*  Clinical Data: Altered mental status.  Fever and cough  CHEST - 2 VIEW  Comparison: 06/08/2011  Findings: Changes of CABG.  Mild cardiac enlargement.  Mild vascular congestion.  Negative for edema or effusion.  Mild bibasilar atelectasis.  No definite pneumonia.  IMPRESSION: Cardiac enlargement with mild vascular congestion.  Mild bibasilar atelectasis.  Original Report Authenticated By: Camelia Phenes, M.D.   Medications: Scheduled Meds:   . acetaminophen  650 mg Rectal Once  . allopurinol  100 mg Oral Daily  . antiseptic oral rinse  15 mL Mouth Rinse BID  . clopidogrel  75 mg Oral Daily  . enoxaparin (LOVENOX) injection  40 mg Subcutaneous Q24H  . ezetimibe-simvastatin  1 tablet Oral QHS  . isosorbide mononitrate  30 mg Oral Daily  . levofloxacin (LEVAQUIN) IV  750 mg Intravenous Q24H  . metoprolol  75 mg Oral Daily  . pantoprazole  40 mg Oral Daily  . pregabalin  75 mg Oral Daily  . primidone  50 mg Oral Daily  . senna  1 tablet Oral BID  . spironolactone  12.5 mg Oral Daily  . torsemide  20 mg Oral Daily   Continuous Infusions:   . DISCONTD: sodium chloride     PRN Meds:.acetaminophen, colchicine, ondansetron (ZOFRAN) IV, ondansetron  Assessment/Plan: Patient Active Hospital Problem List: Altered mental status (09/13/2011) Per daughter she has seen some improvement in her mental status. Will continue to monitor.   UTI: Patient started on Levaquin.  Hyponatremia (09/13/2011) We will recheck in a.m. lab.   Diabetes mellitus (09/13/2011) Well controlled. Sliding-scale insulin.   Pulmonary edema (09/13/2011)  2-D echo to evaluate LV function.  LOS: 1 day   Earlene Plater MD, Ladell Pier 09/13/2011, 6:06 PM

## 2011-09-13 NOTE — ED Notes (Signed)
Remained confused, no c/o pain, no signs of distress

## 2011-09-13 NOTE — ED Notes (Signed)
[facility] 83 Nurse Aware

## 2011-09-13 NOTE — ED Notes (Signed)
Patient is resting comfortably. 

## 2011-09-13 NOTE — Progress Notes (Signed)
  Echocardiogram 2D Echocardiogram has been performed.  Juanita Laster Jeziah Kretschmer, RDCS 09/13/2011, 3:17 PM

## 2011-09-14 DIAGNOSIS — I5023 Acute on chronic systolic (congestive) heart failure: Secondary | ICD-10-CM | POA: Diagnosis present

## 2011-09-14 DIAGNOSIS — D509 Iron deficiency anemia, unspecified: Secondary | ICD-10-CM | POA: Insufficient documentation

## 2011-09-14 DIAGNOSIS — K219 Gastro-esophageal reflux disease without esophagitis: Secondary | ICD-10-CM | POA: Insufficient documentation

## 2011-09-14 DIAGNOSIS — I509 Heart failure, unspecified: Secondary | ICD-10-CM | POA: Diagnosis present

## 2011-09-14 DIAGNOSIS — N390 Urinary tract infection, site not specified: Secondary | ICD-10-CM | POA: Diagnosis present

## 2011-09-14 DIAGNOSIS — E785 Hyperlipidemia, unspecified: Secondary | ICD-10-CM | POA: Insufficient documentation

## 2011-09-14 LAB — GLUCOSE, CAPILLARY: Glucose-Capillary: 118 mg/dL — ABNORMAL HIGH (ref 70–99)

## 2011-09-14 LAB — URINE CULTURE: Colony Count: >=100000

## 2011-09-14 MED ORDER — FUROSEMIDE 10 MG/ML IJ SOLN
40.0000 mg | Freq: Once | INTRAMUSCULAR | Status: AC
  Administered 2011-09-14: 40 mg via INTRAVENOUS
  Filled 2011-09-14: qty 4

## 2011-09-14 NOTE — Progress Notes (Signed)
[facility] CHART REVIEWED; B Meko Bellanger RN, BSN, MHA 

## 2011-09-14 NOTE — Progress Notes (Signed)
Subjective:  Patient is sitting up in the chair with the family in the room.  Objective: Weight change:   Intake/Output Summary (Last 24 hours) at 09/14/11 1729 Last data filed at 09/14/11 1300  Gross per 24 hour  Intake    870 ml  Output      0 ml  Net    870 ml   General: Patient appears her stated age. HEENT: Head normocephalic atraumatic. Cardiovascular: Regular rate rhythm. Lungs: crackles in the base bilaterally. Extremities: 2+ edema bilaterally.  Lab Results: Reviewed  Micro Results: Recent Results (from the past 240 hour(s))  CULTURE, BLOOD (ROUTINE X 2)     Status: Normal (Preliminary result)   Collection Time   09/12/11  6:10 PM      Component Value Range Status Comment   Specimen Description BLOOD RIGHT ARM   Final    Special Requests BOTTLES DRAWN AEROBIC ONLY 3CC   Final    Setup Time [id number]   Final    Culture     Final    Value:        BLOOD CULTURE RECEIVED NO GROWTH TO DATE CULTURE WILL BE HELD FOR 5 DAYS BEFORE ISSUING A FINAL NEGATIVE REPORT   Report Status PENDING   Incomplete   CULTURE, BLOOD (ROUTINE X 2)     Status: Normal (Preliminary result)   Collection Time   09/12/11  6:15 PM      Component Value Range Status Comment   Specimen Description BLOOD RIGHT HAND   Final    Special Requests BOTTLES DRAWN AEROBIC ONLY 3CC   Final    Setup Time [id number]   Final    Culture     Final    Value:        BLOOD CULTURE RECEIVED NO GROWTH TO DATE CULTURE WILL BE HELD FOR 5 DAYS BEFORE ISSUING A FINAL NEGATIVE REPORT   Report Status PENDING   Incomplete   URINE CULTURE     Status: Normal   Collection Time   09/12/11  7:15 PM      Component Value Range Status Comment   Specimen Description URINE, CATHETERIZED   Final    Special Requests NONE   Final    Setup Time [id number]   Final    Colony Count >=100,000 COLONIES/ML   Final    Culture ESCHERICHIA COLI   Final    Report Status 09/14/2011 FINAL   Final    Organism ID, Bacteria ESCHERICHIA  COLI   Final     Studies/Results: Dg Chest 2 View  09/12/2011  *RADIOLOGY REPORT*  Clinical Data: Altered mental status.  Fever and cough  CHEST - 2 VIEW  Comparison: 06/08/2011  Findings: Changes of CABG.  Mild cardiac enlargement.  Mild vascular congestion.  Negative for edema or effusion.  Mild bibasilar atelectasis.  No definite pneumonia.  IMPRESSION: Cardiac enlargement with mild vascular congestion.  Mild bibasilar atelectasis.  Original Report Authenticated By: Camelia Phenes, M.D.   Medications: Scheduled Meds:    . allopurinol  100 mg Oral Daily  . antiseptic oral rinse  15 mL Mouth Rinse BID  . clopidogrel  75 mg Oral Daily  . enoxaparin (LOVENOX) injection  40 mg Subcutaneous Q24H  . ezetimibe-simvastatin  1 tablet Oral QHS  . furosemide  40 mg Intravenous Once  . isosorbide mononitrate  30 mg Oral Daily  . levofloxacin (LEVAQUIN) IV  750 mg Intravenous Q24H  . metoprolol  75 mg Oral Daily  . pantoprazole  40 mg Oral Daily  . pregabalin  75 mg Oral Daily  . primidone  50 mg Oral Daily  . senna  1 tablet Oral BID  . spironolactone  12.5 mg Oral Daily  . torsemide  20 mg Oral Daily   Continuous Infusions:  PRN Meds:.acetaminophen, colchicine, ondansetron (ZOFRAN) IV, ondansetron  Assessment/Plan: Patient Active Hospital Problem List: Altered mental status (09/13/2011) Per daughter she has seen some improvement in her mental status. Will continue to monitor.   UTI: Patient started on Levaquin. Continue Levaquin. Sensitivities reviewed.  Hyponatremia (09/13/2011) Chronic and stable.  Diabetes mellitus (09/13/2011) Well controlled. Sliding-scale insulin.   Pulmonary edema/CHF (09/13/2011)  2-D echo  Shows decreased LV function. Consulted [facility] who will review patient's records. Did discuss with the family. Will give her one dose of IV Lasix. Continue her by mouth medications.  LOS: 2 days    Earlene Plater MD, Ladell Pier 09/14/2011, 5:29  PM

## 2011-09-14 NOTE — Consult Note (Signed)
[facility]       CONSULTATION NOTE   Reason for Consult: Reduced ejection fraction on echocardiogram  Requesting Physician: Dr. Wandra Mannan  HPI: This is a 77 y.o. female with a past medical history significant for coronary artery disease status post CABG in June 2000, with the LIMA to LAD, left radial artery to the OM, SVG to the PDA and SVG to the diagonal. She cardiac catheterization January 2006 and ultimately suffered a neurologic event post catheterization. She underwent SVG to PDA cipher stenting in 10/26/2004. Her last Myoview was in May of 2011 which showed normal perfusion. An echocardiogram done in May of 2000 leavens showed an EF of 45-50% with diastolic dysfunction, mild aortic sclerosis and mild mitral regurgitation. She was admitted in April 2012 was found to be profoundly anemic and underwent EGD and colonoscopy by Dr. Ewing Schlein. Recently she was admitted for confusion and found to be hyponatremic and also to have a urinary tract infection. An echocardiogram was requested for unknown reasons and we were consult and do to the reduced ejection fraction on the echocardiogram. This is in comparison to the last hospital echocardiogram in 2006, prior to her stroke and cardiac event.  Currently she denies chest pain, shortness of breath, worsening lower extremity swelling, or associated heart failure symptoms. Her main concern is that her urinary tract infection at her daughter's main concern is her confusion which seems to be clearing with antibiotics.  PMHx:  Past Medical History  Diagnosis Date  . Diabetes mellitus   . Hypertension   . Stroke Right Side Weakness   Past Surgical History  Procedure Date  . Cardiac surgery   . Coronary artery bypass graft     FAMHx: History reviewed. No pertinent family history.  SOCHx:  reports that she has never smoked. She has never used smokeless tobacco. She reports that she does not drink alcohol or use  illicit drugs.  ALLERGIES: Allergies  Allergen Reactions  . Penicillins Itching and Swelling    ROS: A comprehensive review of systems was negative except for: Genitourinary: positive for dysuria and incontinence Musculoskeletal: positive for edema  HOME MEDICATIONS: Prescriptions prior to admission  Medication Sig Dispense Refill  . acetaminophen (TYLENOL) 500 MG tablet Take 1,000 mg by mouth every 6 (six) hours as needed. pain       . allopurinol (ZYLOPRIM) 100 MG tablet Take 100 mg by mouth daily.        . clopidogrel (PLAVIX) 75 MG tablet Take 75 mg by mouth daily.        . colchicine 0.6 MG tablet Take 0.6 mg by mouth 2 (two) times daily as needed.        Marland Kitchen esomeprazole (NEXIUM) 40 MG capsule Take 40 mg by mouth daily before breakfast.        . ezetimibe-simvastatin (VYTORIN) 10-20 MG per tablet Take 1 tablet by mouth at bedtime.        . folic acid (FOLVITE) 1 MG tablet Take 1 mg by mouth daily.        . isosorbide mononitrate (IMDUR) 30 MG 24 hr tablet Take 30 mg by mouth daily.        . metoprolol (LOPRESSOR) 50 MG tablet Take 75 mg by mouth daily.        . polysaccharide iron (NIFEREX) 150 MG CAPS capsule Take 150 mg by mouth 2 (two) times daily.        . pregabalin (LYRICA) 75 MG  capsule Take 75 mg by mouth daily.        . primidone (MYSOLINE) 50 MG tablet Take 50 mg by mouth daily.        Marland Kitchen senna (SENOKOT) 8.6 MG tablet Take 1 tablet by mouth 2 (two) times daily.        Marland Kitchen spironolactone (ALDACTONE) 25 MG tablet Take 12.5 mg by mouth daily.        Marland Kitchen torsemide (DEMADEX) 20 MG tablet Take 20 mg by mouth daily.          HOSPITAL MEDICATIONS: Prior to Admission:  Prescriptions prior to admission  Medication Sig Dispense Refill  . acetaminophen (TYLENOL) 500 MG tablet Take 1,000 mg by mouth every 6 (six) hours as needed. pain       . allopurinol (ZYLOPRIM) 100 MG tablet Take 100 mg by mouth daily.        . clopidogrel (PLAVIX) 75 MG tablet Take 75 mg by mouth daily.          . colchicine 0.6 MG tablet Take 0.6 mg by mouth 2 (two) times daily as needed.        Marland Kitchen esomeprazole (NEXIUM) 40 MG capsule Take 40 mg by mouth daily before breakfast.        . ezetimibe-simvastatin (VYTORIN) 10-20 MG per tablet Take 1 tablet by mouth at bedtime.        . folic acid (FOLVITE) 1 MG tablet Take 1 mg by mouth daily.        . isosorbide mononitrate (IMDUR) 30 MG 24 hr tablet Take 30 mg by mouth daily.        . metoprolol (LOPRESSOR) 50 MG tablet Take 75 mg by mouth daily.        . polysaccharide iron (NIFEREX) 150 MG CAPS capsule Take 150 mg by mouth 2 (two) times daily.        . pregabalin (LYRICA) 75 MG capsule Take 75 mg by mouth daily.        . primidone (MYSOLINE) 50 MG tablet Take 50 mg by mouth daily.        Marland Kitchen senna (SENOKOT) 8.6 MG tablet Take 1 tablet by mouth 2 (two) times daily.        Marland Kitchen spironolactone (ALDACTONE) 25 MG tablet Take 12.5 mg by mouth daily.        Marland Kitchen torsemide (DEMADEX) 20 MG tablet Take 20 mg by mouth daily.          VITALS: Blood pressure 105/70, pulse 79, temperature 98.4 F (36.9 C), temperature source Oral, resp. rate 18, height 5\' 3"  (1.6 m), weight 86.8 kg (191 lb 5.8 oz), SpO2 99.00%.  PHYSICAL EXAM: General appearance: alert, cooperative and no distress Neck: no adenopathy, no carotid bruit, no JVD, supple, symmetrical, trachea midline and thyroid not enlarged, symmetric, no tenderness/mass/nodules Lungs: clear to auscultation bilaterally Heart: regular rate and rhythm, S1, S2 normal, no murmur, click, rub or gallop Abdomen: obese, soft, non-tender to palpation Extremities: edema 1+, there is warmth to the touch over both ankles Pulses: 2+ and symmetric Skin: Skin color, texture, turgor normal. No rashes or lesions Neurologic: Grossly normal  LABS: Results for orders placed during the hospital encounter of 09/12/11 (from the past 48 hour(s))  CBC     Status: Abnormal   Collection Time   09/12/11  6:10 PM      Component Value Range  Comment   WBC 6.7  4.0 - 10.5 (K/uL)    RBC 3.88  3.87 -  5.11 (MIL/uL)    Hemoglobin 10.2 (*) 12.0 - 15.0 (g/dL)    HCT 96.0 (*) 45.4 - 46.0 (%)    MCV 82.0  78.0 - 100.0 (fL)    MCH 26.3  26.0 - 34.0 (pg)    MCHC 32.1  30.0 - 36.0 (g/dL)    RDW 09.8 (*) 11.9 - 15.5 (%)    Platelets 141 (*) 150 - 400 (K/uL) SPECIMEN CHECKED FOR CLOTS  DIFFERENTIAL     Status: Abnormal   Collection Time   09/12/11  6:10 PM      Component Value Range Comment   Neutrophils Relative 68  43 - 77 (%)    Lymphocytes Relative 13  12 - 46 (%)    Monocytes Relative 19 (*) 3 - 12 (%)    Eosinophils Relative 0  0 - 5 (%)    Basophils Relative 0  0 - 1 (%)    Neutro Abs 4.5  1.7 - 7.7 (K/uL)    Lymphs Abs 0.9  0.7 - 4.0 (K/uL)    Monocytes Absolute 1.3 (*) 0.1 - 1.0 (K/uL)    Eosinophils Absolute 0.0  0.0 - 0.7 (K/uL)    Basophils Absolute 0.0  0.0 - 0.1 (K/uL)    RBC Morphology STOMATOCYTES   POLYCHROMASIA PRESENT   Smear Review PLATELET COUNT CONFIRMED BY SMEAR     COMPREHENSIVE METABOLIC PANEL     Status: Abnormal   Collection Time   09/12/11  6:10 PM      Component Value Range Comment   Sodium 132 (*) 135 - 145 (mEq/L)    Potassium 3.7  3.5 - 5.1 (mEq/L)    Chloride 92 (*) 96 - 112 (mEq/L)    CO2 30  19 - 32 (mEq/L)    Glucose, Bld 102 (*) 70 - 99 (mg/dL)    BUN 24 (*) 6 - 23 (mg/dL)    Creatinine, Ser 1.47  0.50 - 1.10 (mg/dL)    Calcium 9.1  8.4 - 10.5 (mg/dL)    Total Protein 7.0  6.0 - 8.3 (g/dL)    Albumin 3.2 (*) 3.5 - 5.2 (g/dL)    AST 21  0 - 37 (U/L)    ALT 16  0 - 35 (U/L)    Alkaline Phosphatase 109  39 - 117 (U/L)    Total Bilirubin 0.4  0.3 - 1.2 (mg/dL)    GFR calc non Af Amer 50 (*) >90 (mL/min)    GFR calc Af Amer 59 (*) >90 (mL/min)   CULTURE, BLOOD (ROUTINE X 2)     Status: Normal (Preliminary result)   Collection Time   09/12/11  6:10 PM      Component Value Range Comment   Specimen Description BLOOD RIGHT ARM      Special Requests BOTTLES DRAWN AEROBIC ONLY 3CC       Setup Time [id number]      Culture        Value:        BLOOD CULTURE RECEIVED NO GROWTH TO DATE CULTURE WILL BE HELD FOR 5 DAYS BEFORE ISSUING A FINAL NEGATIVE REPORT   Report Status PENDING     LACTIC ACID, PLASMA     Status: Normal   Collection Time   09/12/11  6:10 PM      Component Value Range Comment   Lactic Acid, Venous 1.9  0.5 - 2.2 (mmol/L)   CULTURE, BLOOD (ROUTINE X 2)     Status: Normal (Preliminary result)   Collection Time  09/12/11  6:15 PM      Component Value Range Comment   Specimen Description BLOOD RIGHT HAND      Special Requests BOTTLES DRAWN AEROBIC ONLY 3CC      Setup Time [id number]      Culture        Value:        BLOOD CULTURE RECEIVED NO GROWTH TO DATE CULTURE WILL BE HELD FOR 5 DAYS BEFORE ISSUING A FINAL NEGATIVE REPORT   Report Status PENDING     URINALYSIS, ROUTINE W REFLEX MICROSCOPIC     Status: Abnormal   Collection Time   09/12/11  7:15 PM      Component Value Range Comment   Color, Urine YELLOW  YELLOW     APPearance TURBID (*) CLEAR     Specific Gravity, Urine 1.010  1.005 - 1.030     pH 6.0  5.0 - 8.0     Glucose, UA NEGATIVE  NEGATIVE (mg/dL)    Hgb urine dipstick LARGE (*) NEGATIVE     Bilirubin Urine NEGATIVE  NEGATIVE     Ketones, ur NEGATIVE  NEGATIVE (mg/dL)    Protein, ur 30 (*) NEGATIVE (mg/dL)    Urobilinogen, UA 0.2  0.0 - 1.0 (mg/dL)    Nitrite NEGATIVE  NEGATIVE     Leukocytes, UA LARGE (*) NEGATIVE    URINE CULTURE     Status: Normal   Collection Time   09/12/11  7:15 PM      Component Value Range Comment   Specimen Description URINE, CATHETERIZED      Special Requests NONE      Setup Time [id number]      Colony Count >=100,000 COLONIES/ML      Culture ESCHERICHIA COLI      Report Status 09/14/2011 FINAL      Organism ID, Bacteria ESCHERICHIA COLI     URINE MICROSCOPIC-ADD ON     Status: Abnormal   Collection Time   09/12/11  7:15 PM      Component Value Range Comment   WBC, UA TOO NUMEROUS TO COUNT  <3  (WBC/hpf)    RBC / HPF 3-6  <3 (RBC/hpf)    Bacteria, UA FEW (*) RARE     Urine-Other MICROSCOPIC EXAM PERFORMED ON UNCONCENTRATED URINE   LESS THAN 10 mL OF URINE SUBMITTED  GLUCOSE, CAPILLARY     Status: Normal   Collection Time   09/12/11  7:19 PM      Component Value Range Comment   Glucose-Capillary 95  70 - 99 (mg/dL)   OCCULT BLOOD, POC DEVICE     Status: Normal   Collection Time   09/12/11  7:21 PM      Component Value Range Comment   Fecal Occult Bld NEGATIVE     PRO B NATRIURETIC PEPTIDE     Status: Abnormal   Collection Time   09/12/11 10:30 PM      Component Value Range Comment   Pro B Natriuretic peptide (BNP) 1787.0 (*) 0 - 450 (pg/mL)   HEMOGLOBIN A1C     Status: Abnormal   Collection Time   09/12/11 10:30 PM      Component Value Range Comment   Hemoglobin A1C 5.9 (*) <5.7 (%)    Mean Plasma Glucose 123 (*) <117 (mg/dL)   BASIC METABOLIC PANEL     Status: Abnormal   Collection Time   09/13/11  5:00 AM      Component Value Range Comment   Sodium 133 (*)  135 - 145 (mEq/L)    Potassium 4.0  3.5 - 5.1 (mEq/L)    Chloride 93 (*) 96 - 112 (mEq/L)    CO2 31  19 - 32 (mEq/L)    Glucose, Bld 93  70 - 99 (mg/dL)    BUN 28 (*) 6 - 23 (mg/dL)    Creatinine, Ser 1.61 (*) 0.50 - 1.10 (mg/dL)    Calcium 9.4  8.4 - 10.5 (mg/dL)    GFR calc non Af Amer 44 (*) >90 (mL/min)    GFR calc Af Amer 51 (*) >90 (mL/min)   CBC     Status: Abnormal   Collection Time   09/13/11  5:00 AM      Component Value Range Comment   WBC 5.5  4.0 - 10.5 (K/uL)    RBC 3.84 (*) 3.87 - 5.11 (MIL/uL)    Hemoglobin 10.2 (*) 12.0 - 15.0 (g/dL)    HCT 09.6 (*) 04.5 - 46.0 (%)    MCV 83.3  78.0 - 100.0 (fL)    MCH 26.6  26.0 - 34.0 (pg)    MCHC 31.9  30.0 - 36.0 (g/dL)    RDW 40.9 (*) 81.1 - 15.5 (%)    Platelets 122 (*) 150 - 400 (K/uL)   GLUCOSE, CAPILLARY     Status: Normal   Collection Time   09/13/11  7:34 AM      Component Value Range Comment   Glucose-Capillary 83  70 - 99 (mg/dL)     GLUCOSE, CAPILLARY     Status: Abnormal   Collection Time   09/14/11  7:45 AM      Component Value Range Comment   Glucose-Capillary 118 (*) 70 - 99 (mg/dL)    Comment 1 Documented in Chart      Comment 2 Notify RN     PRO B NATRIURETIC PEPTIDE     Status: Normal   Collection Time   09/14/11  3:59 PM      Component Value Range Comment   Pro B Natriuretic peptide (BNP) 265.4  0 - 450 (pg/mL)     IMAGING: Dg Chest 2 View  09/12/2011  *RADIOLOGY REPORT*  Clinical Data: Altered mental status.  Fever and cough  CHEST - 2 VIEW  Comparison: 06/08/2011  Findings: Changes of CABG.  Mild cardiac enlargement.  Mild vascular congestion.  Negative for edema or effusion.  Mild bibasilar atelectasis.  No definite pneumonia.  IMPRESSION: Cardiac enlargement with mild vascular congestion.  Mild bibasilar atelectasis.  Original Report Authenticated By: Camelia Phenes, M.D.   Echocardiogram (09/13/11):  Study Conclusions  - Left ventricle: The cavity size was normal. There was moderate concentric hypertrophy. Systolic function was mildly to moderately reduced. The estimated ejection fraction was in the range of 40% to 45%. There is posterior basal and basal to mid inferior hypokinesis to akinesis. Doppler parameters are consistent with abnormal left ventricular relaxation (grade 1 diastolic dysfunction). The E/e' ratio is >10, suggesting elevated LV filling pressure. - Mitral valve: Mildly thickened leaflets . There appears to be tethering of the posterior mitral leaflet. There is mild to moderate posteriorly directed mitral regurgitation. - Left atrium: The atrium was at the upper limits of normal in size. - Inferior vena cava: The vessel was normal in size; the respirophasic diameter changes were in the normal range (= 50%); findings are consistent with normal central venous pressure.  IMPRESSION: 1. Acute on chronic systolic heart failure, New York Heart Association class II symptoms,  decompensated, congestive  2. 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 Urinary tract infection responding to antibiotics 4. Hypertension-controlled 5. Delirium-clearing  RECOMMENDATION: 1. Mrs. Dominski mental status is clearing with treatment of her Escherichia coli urinary tract infection. I suspect her hyponatremia is due to a combination of heart failure and SIADH. Her BNP was elevated to 1700 but has come down to 265 with diuretics. Loop diuretic should help improve her hyponatremia is well. Currently I don't feel she is significantly volume overloaded and most likely we can reduce her diuretics to her home dose of torsemide 20 mg daily tomorrow. With regards to her ejection fraction is apparently stable compared to her last office echocardiogram in April 2011. Her HF regimen is adequate with metoprolol, imdur, plavix, torsemide and aspirin. She is not an an ACE-I.  If blood pressure permits, I would recommend adding this, especially since she is diabetic.  Thank you for consulting [place]. No further recommendations at this time. We'll range followup with Dr. Tresa Endo in our office for her after discharge.  Time Spent Directly with Patient: 30 minutes  Chrystie Nose, MD Attending Cardiologist [facility]  HILTY,Kenneth C 09/14/2011, 5:21 PM

## 2011-09-14 NOTE — Plan of Care (Signed)
Problem: Phase II Progression Outcomes Goal: Progress activity as tolerated unless otherwise ordered Outcome: Progressing Daughter reports patient is unable to walk and they use a hoyer lift at home. Will need to use a maximove while here in hospital

## 2011-09-14 NOTE — Clinical Documentation Improvement (Signed)
CHF DOCUMENTATION CLARIFICATION QUERY  THIS DOCUMENT IS NOT A PERMANENT PART OF THE MEDICAL RECORD  TO RESPOND TO THE THIS QUERY, FOLLOW THE INSTRUCTIONS BELOW:  1. If needed, update documentation for the patient's encounter via the notes activity.  2. Access this query again and click edit on the In [employer].  3. After updating, or not, click F2 to complete all highlighted (required) fields concerning your review. Select "additional documentation in the medical record" OR "no additional documentation provided".  4. Click Sign note button.  5. The deficiency will fall out of your In Basket *Please let us know if you are not able to complete this workflow by phone or e-mail (listed below).  Please update your documentation within the medical record to reflect your response to this query.                                                                                    09/14/11  Dear Dr. Wandra Mannan, [facility],  In a better effort to capture your patient's severity of illness, reflect appropriate length of stay and utilization of resources, a review of the patient medical record has revealed the following indicators the diagnosis of Heart Failure.    Based on your clinical judgment, please clarify and document in a progress note and/or discharge summary the clinical condition associated with the following supporting information:  In responding to this query please exercise your independent judgment.  The fact that a query is asked, does not imply that any particular answer is desired or expected.  Based on the patient's current clinical presentation please clarify:  Pt admitted with UTI vs PNA.  Pt admitted with CHF. Please clarify the the acuity and type of CHF and document in pn and  d/c summary.    Possible Clinical Conditions?  Chronic Systolic Congestive Heart Failure Chronic Diastolic Congestive Heart Failure Chronic Systolic & Diastolic Congestive Heart  Failure Acute Systolic Congestive Heart Failure Acute Diastolic Congestive Heart Failure Acute Systolic & Diastolic Congestive Heart Failure Acute on Chronic Systolic Congestive Heart Failure Acute on Chronic Diastolic Congestive Heart Failure Acute on Chronic Systolic & Diastolic  Congestive Heart Failure Other Condition________________________________________ Cannot Clinically Determine  Supporting Information:  Risk Factors: UTI vs PNA, ? CHF exacerbation  Signs & Symptoms H/P CHF EXACERBATION - no recent 2 D ECHO available and CXR was suggestive of mild pulmonary vascular congestion - obtain BNP for now and hold off on iVF, continue torsemide for now - obtain 2 D ECHO - follow up on I's and O's  :  Pulmonary edema (09/13/2011) 2-D echo to evaluate LV function H/P Trace bilateral pitting edema  Diagnostics: 09/12/11 BNP: 1787  The estimated ejection fraction was in the range of 40% to 45%.  CXR 09/12/11 IMPRESSION: Cardiac enlargement with mild vascular congestion  CXR 09/12/11 IMPRESSION: Cardiac enlargement with mild vascular congestion.  Mild bibasilar atelectasis  Treatment: DEMADEX ALDACTONE LOPRESSOR IMDUR   Reviewed: additional documentation in the medical record Q=agreed 10/01/11 [facility]  Thank You,  Enis Slipper  RN, BSN, CCDS Clinical Documentation Specialist [facility] Dept Pager: [phone] / E-mail: [email or web address]  Health Information Management [place]

## 2011-09-14 NOTE — Progress Notes (Signed)
Physical Therapy Evaluation Patient Details Name: [name] MRN: [id number] DOB: November 11, 1930 Today's Date: 09/14/2011 1120-1140 EVI  Problem List:  Patient Active Problem List  Diagnoses  . Altered mental status  . Hyponatremia  . Diabetes mellitus  . Pulmonary edema    Past Medical History:  Past Medical History  Diagnosis Date  . Diabetes mellitus   . Hypertension   . Stroke Right Side Weakness   Past Surgical History:  Past Surgical History  Procedure Date  . Cardiac surgery   . Coronary artery bypass graft     PT Assessment/Plan/Recommendation PT Assessment Clinical Impression Statement: daughter reports patient is at baseline function and she does not need further PT They would like HHRN throught [facility]. PT Recommendation/Assessment: Patent does not need any further PT services No Skilled PT: Patient at baseline level of functioning;Patient will have necessary level of assist by caregiver at discharge PT Recommendation Follow Up Recommendations: None Equipment Recommended: None recommended by PT PT Goals     PT Evaluation Precautions/Restrictions  Precautions Precaution Comments: pt speaks only Arab[place] daughter at bedside able to interpret Prior Functioning  Home Living Lives With: Family Receives Help From: Family Type of Home: House Additional Comments: Daughter reports patient has not stood since April.  They use a hoyer lift at home.  Daughter is not interested in getting patient to stand. Prior Function Level of Independence: Needs assistance with tranfers (Transfers only with hoyer lift) Comments: daughter reports pt is bedbound at home, but pt  does some exercises with family Cognition Cognition Arousal/Alertness: Awake/alert Overall Cognitive Status: Appears within functional limits for tasks assessed Sensation/Coordination   Extremity Assessment RLE Assessment RLE Assessment: Exceptions to [facility] (gen weakness from previous CVA) LLE  Assessment LLE Assessment: Within Functional Limits Mobility (including Balance) Bed Mobility Bed Mobility: Yes Rolling Right: 1: +1 Total assist Rolling Right Details (indicate cue type and reason): pt needs assist to place left leg to push and to pull patient over with pad Rolling Left: 1: +2 Total assist Rolling Left Details (indicate cue type and reason): pt <25% Transfers Transfers: Yes (attempted STEDY, pt unable to fit Needs maximove ) Transfer via Lift Equipment: Maximove Ambulation/Gait Ambulation/Gait: No Stairs: No Wheelchair Mobility Wheelchair Mobility: No  Posture/Postural Control Posture/Postural Control: Postural limitations Postural Limitations: obesity Balance Balance Assessed: Yes Static Sitting Balance Static Sitting - Balance Support: Bilateral upper extremity supported;No upper extremity supported;Feet supported Static Sitting - Level of Assistance: 5: Stand by assistance Static Sitting - Comment/# of Minutes: ~5 minutes Exercise  Other Exercises Other Exercises: daughter and patient able to demonstrate part of  the exercise program they do at home End of Session PT - End of Session Activity Tolerance: Treatment limited secondary to medical complications (Comment) (weakness from previous CVA and decondidtioning) Patient left: in chair Nurse Communication: Need for lift equipment (maximove) General Behavior During Session: [facility] for tasks performed (communication barrier) Cognition: WFL for tasks performed  Donnetta Hail 09/14/2011, 12:03 PM

## 2011-09-14 NOTE — Progress Notes (Signed)
INITIAL ADULT NUTRITION ASSESSMENT Date: 09/14/2011   Time: 2:13 PM Reason for Assessment: Consult, Nutrition risk  ASSESSMENT: Female 77 y.o.  Dx: Altered mental status  Hx:  Past Medical History  Diagnosis Date  . Diabetes mellitus   . Hypertension   . Stroke Right Side Weakness   Related Meds:  Scheduled Meds:   . allopurinol  100 mg Oral Daily  . antiseptic oral rinse  15 mL Mouth Rinse BID  . clopidogrel  75 mg Oral Daily  . enoxaparin (LOVENOX) injection  40 mg Subcutaneous Q24H  . ezetimibe-simvastatin  1 tablet Oral QHS  . isosorbide mononitrate  30 mg Oral Daily  . levofloxacin (LEVAQUIN) IV  750 mg Intravenous Q24H  . metoprolol  75 mg Oral Daily  . pantoprazole  40 mg Oral Daily  . pregabalin  75 mg Oral Daily  . primidone  50 mg Oral Daily  . senna  1 tablet Oral BID  . spironolactone  12.5 mg Oral Daily  . torsemide  20 mg Oral Daily   Continuous Infusions:  PRN Meds:.acetaminophen, colchicine, ondansetron (ZOFRAN) IV, ondansetron  Ht: 5\' 3"  (160 cm)  Wt: 191 lb 5.8 oz (86.8 kg)  Ideal Wt: 52.4 kg  % Ideal Wt: 165  Usual Wt: Unable to assess % Usual Wt: Unable to assess  Body mass index is 33.90 kg/(m^2).  Food/Nutrition Related Hx: Pt non-English speaking, family at bedside. Family reports pt typically "eats like a baby", eating small amounts of food throughout the day. Family reports they think pt has been losing weight recently, but unsure how much or how much pt usually weighs. Family states pt is always coughing, not just with eating. Family states that pt without nausea. Pt has not had anything to eat today r/t family unsure how to order meals, educated family on how to order meals and snacks were given.   Labs:  CMP     Component Value Date/Time   NA 133* 09/13/2011 0500   K 4.0 09/13/2011 0500   CL 93* 09/13/2011 0500   CO2 31 09/13/2011 0500   GLUCOSE 93 09/13/2011 0500   BUN 28* 09/13/2011 0500   CREATININE 1.15* 09/13/2011 0500   CALCIUM 9.4 09/13/2011 0500   PROT 7.0 09/12/2011 1810   ALBUMIN 3.2* 09/12/2011 1810   AST 21 09/12/2011 1810   ALT 16 09/12/2011 1810   ALKPHOS 109 09/12/2011 1810   BILITOT 0.4 09/12/2011 1810   GFRNONAA 44* 09/13/2011 0500   GFRAA 51* 09/13/2011 0500   CBG (last 3)   Basename 09/14/11 0745 09/13/11 0734 09/12/11 1919  GLUCAP 118* 83 95   Lab Results  Component Value Date   HGBA1C 5.9* 09/12/2011    Intake/Output Summary (Last 24 hours) at 09/14/11 1416 Last data filed at 09/14/11 0500  Gross per 24 hour  Intake    390 ml  Output      0 ml  Net    390 ml    Diet Order: General  Estimated Nutritional Needs:   Kcal:1500-1700 Protein:60-80g Fluid:1.5-1.7L  NUTRITION DIAGNOSIS: -Inadequate oral intake (NI-2.1).  Status: Ongoing -Suspect pt with some mild malnutrition PTA, however currently no weight history present, but family admits that pt with chronic poor intake  RELATED TO: confusion on how to order meals  AS EVIDENCE BY: family statement  MONITORING/EVALUATION(Goals): Pt to consume >50% of meals/supplements.   EDUCATION NEEDS: -No education needs identified at this time  INTERVENTION: Gave pt nutritional supplements to try. Encouraged increased intake. Will  send snacks for pt. Will monitor.   Dietitian # [phone]  DOCUMENTATION CODES Per approved criteria  -Obesity Unspecified    Marshall Cork 09/14/2011, 2:13 PM

## 2011-09-15 DIAGNOSIS — D696 Thrombocytopenia, unspecified: Secondary | ICD-10-CM | POA: Diagnosis present

## 2011-09-15 LAB — BASIC METABOLIC PANEL
Calcium: 8.5 mg/dL (ref 8.4–10.5)
Creatinine, Ser: 1.15 mg/dL — ABNORMAL HIGH (ref 0.50–1.10)
GFR calc Af Amer: 51 mL/min — ABNORMAL LOW (ref >90)
GFR calc non Af Amer: 44 mL/min — ABNORMAL LOW (ref >90)
Sodium: 135 mEq/L (ref 135–145)

## 2011-09-15 LAB — GLUCOSE, CAPILLARY: Glucose-Capillary: 108 mg/dL — ABNORMAL HIGH (ref 70–99)

## 2011-09-15 LAB — CBC
MCH: 26 pg (ref 26.0–34.0)
MCV: 83.1 fL (ref 78.0–100.0)
Platelets: 111 10*3/uL — ABNORMAL LOW (ref 150–400)
RDW: 19.1 % — ABNORMAL HIGH (ref 11.5–15.5)

## 2011-09-15 MED ORDER — GUAIFENESIN ER 600 MG PO TB12
600.0000 mg | ORAL_TABLET | Freq: Two times a day (BID) | ORAL | Status: DC
  Administered 2011-09-15 – 2011-09-16 (×3): 600 mg via ORAL
  Filled 2011-09-15 (×4): qty 1

## 2011-09-15 MED ORDER — METOPROLOL TARTRATE 50 MG PO TABS
50.0000 mg | ORAL_TABLET | Freq: Every day | ORAL | Status: DC
  Administered 2011-09-15 – 2011-09-16 (×2): 50 mg via ORAL
  Filled 2011-09-15 (×2): qty 1

## 2011-09-15 MED ORDER — LISINOPRIL 2.5 MG PO TABS
2.5000 mg | ORAL_TABLET | Freq: Every day | ORAL | Status: DC
  Administered 2011-09-15 – 2011-09-16 (×2): 2.5 mg via ORAL
  Filled 2011-09-15 (×2): qty 1

## 2011-09-15 MED ORDER — HYDROCOD POLST-CHLORPHEN POLST 10-8 MG/5ML PO LQCR
5.0000 mL | Freq: Two times a day (BID) | ORAL | Status: DC | PRN
  Administered 2011-09-15: 5 mL via ORAL
  Filled 2011-09-15: qty 5

## 2011-09-15 MED ORDER — FONDAPARINUX SODIUM 2.5 MG/0.5ML ~~LOC~~ SOLN
2.5000 mg | Freq: Every day | SUBCUTANEOUS | Status: DC
  Administered 2011-09-15 – 2011-09-16 (×2): 2.5 mg via SUBCUTANEOUS
  Filled 2011-09-15 (×4): qty 0.5

## 2011-09-15 NOTE — Progress Notes (Signed)
Subjective:  Patient is in bed. Her altered mental status has resolved. Per discussion with her daughter she is back at baseline.  Objective: Weight change:   Intake/Output Summary (Last 24 hours) at 09/15/11 1527 Last data filed at 09/15/11 1300  Gross per 24 hour  Intake    480 ml  Output      0 ml  Net    480 ml   General: Patient appears her stated age. HEENT: Head normocephalic atraumatic. Cardiovascular: Regular rate rhythm. Lungs: crackles in the base bilaterally. Extremities: 2+ edema bilaterally.  Lab Results: Reviewed  Micro Results: Recent Results (from the past 240 hour(s))  CULTURE, BLOOD (ROUTINE X 2)     Status: Normal (Preliminary result)   Collection Time   09/12/11  6:10 PM      Component Value Range Status Comment   Specimen Description BLOOD RIGHT ARM   Final    Special Requests BOTTLES DRAWN AEROBIC ONLY 3CC   Final    Setup Time [id number]   Final    Culture     Final    Value:        BLOOD CULTURE RECEIVED NO GROWTH TO DATE CULTURE WILL BE HELD FOR 5 DAYS BEFORE ISSUING A FINAL NEGATIVE REPORT   Report Status PENDING   Incomplete   CULTURE, BLOOD (ROUTINE X 2)     Status: Normal (Preliminary result)   Collection Time   09/12/11  6:15 PM      Component Value Range Status Comment   Specimen Description BLOOD RIGHT HAND   Final    Special Requests BOTTLES DRAWN AEROBIC ONLY 3CC   Final    Setup Time [id number]   Final    Culture     Final    Value:        BLOOD CULTURE RECEIVED NO GROWTH TO DATE CULTURE WILL BE HELD FOR 5 DAYS BEFORE ISSUING A FINAL NEGATIVE REPORT   Report Status PENDING   Incomplete   URINE CULTURE     Status: Normal   Collection Time   09/12/11  7:15 PM      Component Value Range Status Comment   Specimen Description URINE, CATHETERIZED   Final    Special Requests NONE   Final    Setup Time [id number]   Final    Colony Count >=100,000 COLONIES/ML   Final    Culture ESCHERICHIA COLI   Final    Report Status 09/14/2011  FINAL   Final    Organism ID, Bacteria ESCHERICHIA COLI   Final     Studies/Results: Dg Chest 2 View  09/12/2011  *RADIOLOGY REPORT*  Clinical Data: Altered mental status.  Fever and cough  CHEST - 2 VIEW  Comparison: 06/08/2011  Findings: Changes of CABG.  Mild cardiac enlargement.  Mild vascular congestion.  Negative for edema or effusion.  Mild bibasilar atelectasis.  No definite pneumonia.  IMPRESSION: Cardiac enlargement with mild vascular congestion.  Mild bibasilar atelectasis.  Original Report Authenticated By: Camelia Phenes, M.D.   Medications: Scheduled Meds:    . allopurinol  100 mg Oral Daily  . antiseptic oral rinse  15 mL Mouth Rinse BID  . clopidogrel  75 mg Oral Daily  . ezetimibe-simvastatin  1 tablet Oral QHS  . fondaparinux (ARIXTRA) injection  2.5 mg Subcutaneous Q0600  . furosemide  40 mg Intravenous Once  . guaiFENesin  600 mg Oral BID  . isosorbide mononitrate  30 mg Oral Daily  . levofloxacin (LEVAQUIN) IV  750 mg Intravenous Q24H  . lisinopril  2.5 mg Oral Daily  . metoprolol  50 mg Oral Daily  . pantoprazole  40 mg Oral Daily  . pregabalin  75 mg Oral Daily  . primidone  50 mg Oral Daily  . senna  1 tablet Oral BID  . spironolactone  12.5 mg Oral Daily  . torsemide  20 mg Oral Daily  . DISCONTD: enoxaparin (LOVENOX) injection  40 mg Subcutaneous Q24H  . DISCONTD: metoprolol  75 mg Oral Daily   Continuous Infusions:  PRN Meds:.acetaminophen, chlorpheniramine-HYDROcodone, colchicine, ondansetron (ZOFRAN) IV, ondansetron  Assessment/Plan: Patient Active Hospital Problem List: Altered mental status (09/13/2011) Resolved. Patient is at baseline.   UTI: Patient started on Levaquin. Continue Levaquin. Sensitivities reviewed.  Hyponatremia (09/13/2011) Chronic and stable.  Diabetes mellitus (09/13/2011) Well controlled. Sliding-scale insulin.   Pulmonary edema/CHF (09/13/2011) This also seems to be chronic. Decreased her metoprolol and add  lisinopril.  Thanks for cardiology's input.  Thrombocytopenia: Discontinued Lovenox and add Arixtra.  Will monitor.  Disposition:  Discussed with family patient to be discharged via ambulance tomorrow as patient is not mobile  LOS: 3 days    Earlene Plater MD, [place] Pier 09/15/2011, 3:27 PM

## 2011-09-16 LAB — BASIC METABOLIC PANEL
Calcium: 8.6 mg/dL (ref 8.4–10.5)
GFR calc Af Amer: 46 mL/min — ABNORMAL LOW (ref >90)
GFR calc non Af Amer: 40 mL/min — ABNORMAL LOW (ref >90)
Potassium: 3.7 mEq/L (ref 3.5–5.1)
Sodium: 131 mEq/L — ABNORMAL LOW (ref 135–145)

## 2011-09-16 LAB — CBC
MCH: 26 pg (ref 26.0–34.0)
MCHC: 31.3 g/dL (ref 30.0–36.0)
Platelets: 108 10*3/uL — ABNORMAL LOW (ref 150–400)
RDW: 19.2 % — ABNORMAL HIGH (ref 11.5–15.5)

## 2011-09-16 LAB — GLUCOSE, CAPILLARY: Glucose-Capillary: 116 mg/dL — ABNORMAL HIGH (ref 70–99)

## 2011-09-16 MED ORDER — METOPROLOL TARTRATE 50 MG PO TABS: 50.0000 mg | ORAL_TABLET | Freq: Every day | ORAL | Status: DC

## 2011-09-16 MED ORDER — POLYETHYLENE GLYCOL 3350 17 G PO PACK: 17.0000 g | PACK | Freq: Every day | ORAL | Status: DC

## 2011-09-16 MED ORDER — HYDROCOD POLST-CHLORPHEN POLST 10-8 MG/5ML PO LQCR: 5.0000 mL | Freq: Two times a day (BID) | ORAL | Status: DC | PRN

## 2011-09-16 MED ORDER — POLYETHYLENE GLYCOL 3350 17 G PO PACK
17.0000 g | PACK | Freq: Every day | ORAL | Status: DC
  Administered 2011-09-16: 17 g via ORAL
  Filled 2011-09-16: qty 1

## 2011-09-16 MED ORDER — LEVOFLOXACIN 500 MG PO TABS: 250.0000 mg | ORAL_TABLET | Freq: Every day | ORAL | Status: DC

## 2011-09-16 MED ORDER — LISINOPRIL 2.5 MG PO TABS: 2.5000 mg | ORAL_TABLET | Freq: Every day | ORAL | Status: DC

## 2011-09-16 NOTE — Progress Notes (Signed)
Pt discharged to home via Ambulance with family. Discharge instructions reviewed with daughter who verbalized understanding and new RX's given to daughter also.

## 2011-09-16 NOTE — Discharge Summary (Signed)
Physician Discharge Summary  Patient ID: [name] MRN: [id number] DOB/AGE: 10-22-1930 77 y.o.  Admit date: 09/12/2011 Discharge date: 09/16/2011  Discharge Diagnoses:  Principal Problem:  Urinary tract infection Active Problems:  Iron deficiency anemia  Thrombocytopenia  Altered mental status  Hyponatremia  Diabetes mellitus  Pulmonary edema  Hypertension  GERD (gastroesophageal reflux disease)  CHF exacerbation  Chronic systolic heart failure   Discharge Medication List as of 09/16/2011  2:19 PM    START taking these medications   Details  chlorpheniramine-HYDROcodone (TUSSIONEX) 10-8 MG/5ML LQCR Take 5 mLs by mouth every 12 (twelve) hours as needed., Starting 09/16/2011, Until Discontinued, Print    levofloxacin (LEVAQUIN) 500 MG tablet Take 0.5 tablets (250 mg total) by mouth daily., Starting 09/16/2011, Until Wed 09/26/11, Print    lisinopril (PRINIVIL,ZESTRIL) 2.5 MG tablet Take 1 tablet (2.5 mg total) by mouth daily., Starting 09/16/2011, Until Mon 09/15/12, Print    polyethylene glycol (MIRALAX / GLYCOLAX) packet Take 17 g by mouth daily., Starting 09/16/2011, Until Wed 09/19/11, Print      CONTINUE these medications which have CHANGED   Details  metoprolol (LOPRESSOR) 50 MG tablet Take 1 tablet (50 mg total) by mouth daily., Starting 09/16/2011, Until Discontinued, Print      CONTINUE these medications which have NOT CHANGED   Details  acetaminophen (TYLENOL) 500 MG tablet Take 1,000 mg by mouth every 6 (six) hours as needed. pain , Until Discontinued, Historical Med    allopurinol (ZYLOPRIM) 100 MG tablet Take 100 mg by mouth daily.  , Until Discontinued, Historical Med    clopidogrel (PLAVIX) 75 MG tablet Take 75 mg by mouth daily.  , Until Discontinued, Historical Med    colchicine 0.6 MG tablet Take 0.6 mg by mouth 2 (two) times daily as needed.  , Until Discontinued, Historical Med    esomeprazole (NEXIUM) 40 MG capsule Take 40 mg by mouth daily  before breakfast.  , Until Discontinued, Historical Med    ezetimibe-simvastatin (VYTORIN) 10-20 MG per tablet Take 1 tablet by mouth at bedtime.  , Until Discontinued, Historical Med    folic acid (FOLVITE) 1 MG tablet Take 1 mg by mouth daily.  , Until Discontinued, Historical Med    isosorbide mononitrate (IMDUR) 30 MG 24 hr tablet Take 30 mg by mouth daily.  , Until Discontinued, Historical Med    polysaccharide iron (NIFEREX) 150 MG CAPS capsule Take 150 mg by mouth 2 (two) times daily.  , Until Discontinued, Historical Med    pregabalin (LYRICA) 75 MG capsule Take 75 mg by mouth daily.  , Until Discontinued, Historical Med    primidone (MYSOLINE) 50 MG tablet Take 50 mg by mouth daily.  , Until Discontinued, Historical Med    senna (SENOKOT) 8.6 MG tablet Take 1 tablet by mouth 2 (two) times daily.  , Until Discontinued, Historical Med    spironolactone (ALDACTONE) 25 MG tablet Take 12.5 mg by mouth daily.  , Until Discontinued, Historical Med    torsemide (DEMADEX) 20 MG tablet Take 20 mg by mouth daily.  , Until Discontinued, Historical Med        Discharge Orders    Future Appointments: Provider: Department: Dept Phone: Center:   12/03/2011 2:30 PM Jarrett Soho H[facility] [phone] None   12/03/2011 3:00 PM Lucile Shutters, MD [facility] [phone] None     Future Orders Please Complete By Expires   Diet - low sodium heart healthy      Increase activity slowly  Follow-up Information    Follow up with TRIPP,HENRY in 1 week.      Follow up with KELLY,THOMAS A, MD in 2 weeks.   Contact information:   [address] [place] [address] [phone]          Disposition: Home or Self Care  Discharged Condition: Stable     Full Code   Consults:   cardiology  HPI:  Pt is 77 yo female with PMH outlined below who presents to [facility] with main concern per pt's daughter of change in mental status tha was  initially noted day prior to admission. DUe to language barrier this history was mostly obtained from ED physician who has gotten the history from daughter and she was not available when I have arrived to the pt's room. Per report, pt has been having fevers, and chills, and appeared confused at times, occasional nonproductive cough and poor oral intake. Per daughter pt has remained ambulatory and has not specified any concerns over the few days prior to admission. Pt speaks arabic. Pt's daughter is not aware of any specific abdominal or urinary concerns at this time.   PMH:  As per H & P FH: As per  H & P SH: As per H & P Med: as per H & P Allergies and ROS: as per H&P   Discharge Exam: Blood pressure 104/60, pulse 77, temperature 98.5 F (36.9 C), temperature source Oral, resp. rate 19, height 5\' 3"  (1.6 m), weight 86.8 kg (191 lb 5.8 oz), SpO2 97.00%.  General: Patient appears her stated age.  HEENT: Head normocephalic atraumatic.  Cardiovascular: Regular rate rhythm.  Lungs: crackles in the base bilaterally.  Extremities: 1+ edema bilaterally  Hospital Course:   Urinary tract infection Patient was treated inpatient for her urinary tract infection. She will discharged on Levaquin 250 mg daily to complete a course of therapy. She will follow up with her primary care physician within a week.   Iron deficiency anemia Chronic  outpatient followup with PCP   Thrombocytopenia Patient's platelet is slightly decreased. She could follow up outpatient with her PCP to get it rechecked. At the time of discharge it was 108.   Altered mental status Most likely secondary to infection. Her mental status was at baseline at the time of discharge.   Hyponatremia Chronic. Her low sodium is multifactorial. Sodium at the time of discharge was greater than 130.   Diabetes mellitus Controlled. Continue her home management.   Hypertension Stable at the time of discharge.   GERD (gastroesophageal  reflux disease) Continue Nexium   CHF exacerbation  Chronic systolic heart failure Patient was given one dose of IV Lasix. Her symptoms improved and the swelling in her lower extremities improved. She was placed back on her home dose of diuretics at the time of discharge.  Cardiology was consulted  while she was inpatient. She will followup outpatient with Dr. Tresa Endo. Labs:   Results for orders placed during the hospital encounter of 09/12/11 (from the past 48 hour(s))  CBC     Status: Abnormal   Collection Time   09/15/11  3:52 AM      Component Value Range Comment   WBC 3.8 (*) 4.0 - 10.5 (K/uL)    RBC 3.50 (*) 3.87 - 5.11 (MIL/uL)    Hemoglobin 9.1 (*) 12.0 - 15.0 (g/dL)    HCT 91.4 (*) 78.2 - 46.0 (%)    MCV 83.1  78.0 - 100.0 (fL)  MCH 26.0  26.0 - 34.0 (pg)    MCHC 31.3  30.0 - 36.0 (g/dL)    RDW 11.9 (*) 14.7 - 15.5 (%)    Platelets 111 (*) 150 - 400 (K/uL)   BASIC METABOLIC PANEL     Status: Abnormal   Collection Time   09/15/11  3:52 AM      Component Value Range Comment   Sodium 135  135 - 145 (mEq/L)    Potassium 3.6  3.5 - 5.1 (mEq/L)    Chloride 94 (*) 96 - 112 (mEq/L)    CO2 29  19 - 32 (mEq/L)    Glucose, Bld 124 (*) 70 - 99 (mg/dL)    BUN 26 (*) 6 - 23 (mg/dL)    Creatinine, Ser 8.29 (*) 0.50 - 1.10 (mg/dL)    Calcium 8.5  8.4 - 10.5 (mg/dL)    GFR calc non Af Amer 44 (*) >90 (mL/min)    GFR calc Af Amer 51 (*) >90 (mL/min)   GLUCOSE, CAPILLARY     Status: Abnormal   Collection Time   09/15/11  8:39 AM      Component Value Range Comment   Glucose-Capillary 108 (*) 70 - 99 (mg/dL)   GLUCOSE, CAPILLARY     Status: Abnormal   Collection Time   09/16/11  7:37 AM      Component Value Range Comment   Glucose-Capillary 116 (*) 70 - 99 (mg/dL)   CBC     Status: Abnormal   Collection Time   09/16/11  8:52 AM      Component Value Range Comment   WBC 3.6 (*) 4.0 - 10.5 (K/uL)    RBC 3.31 (*) 3.87 - 5.11 (MIL/uL)    Hemoglobin 8.6 (*) 12.0 - 15.0 (g/dL)    HCT  56.2 (*) 13.0 - 46.0 (%)    MCV 83.1  78.0 - 100.0 (fL)    MCH 26.0  26.0 - 34.0 (pg)    MCHC 31.3  30.0 - 36.0 (g/dL)    RDW 86.5 (*) 78.4 - 15.5 (%)    Platelets 108 (*) 150 - 400 (K/uL)   BASIC METABOLIC PANEL     Status: Abnormal   Collection Time   09/16/11  8:52 AM      Component Value Range Comment   Sodium 131 (*) 135 - 145 (mEq/L)    Potassium 3.7  3.5 - 5.1 (mEq/L)    Chloride 93 (*) 96 - 112 (mEq/L)    CO2 31  19 - 32 (mEq/L)    Glucose, Bld 102 (*) 70 - 99 (mg/dL)    BUN 29 (*) 6 - 23 (mg/dL)    Creatinine, Ser 6.96 (*) 0.50 - 1.10 (mg/dL)    Calcium 8.6  8.4 - 10.5 (mg/dL)    GFR calc non Af Amer 40 (*) >90 (mL/min)    GFR calc Af Amer 46 (*) >90 (mL/min)     Diagnostics:  Dg Chest 2 View  09/12/2011  *RADIOLOGY REPORT*  Clinical Data: Altered mental status.  Fever and cough  CHEST - 2 VIEW  Comparison: 06/08/2011  Findings: Changes of CABG.  Mild cardiac enlargement.  Mild vascular congestion.  Negative for edema or effusion.  Mild bibasilar atelectasis.  No definite pneumonia.  IMPRESSION: Cardiac enlargement with mild vascular congestion.  Mild bibasilar atelectasis.  Original Report Authenticated By: Camelia Phenes, M.D.   Time spent with patient arranging for discharge is approximately 45 minutes. SignedEarlene Plater MD, [place] Pier 09/16/2011, 4:58  PM

## 2011-09-19 ENCOUNTER — Emergency Department (HOSPITAL_COMMUNITY)
Admission: EM | Admit: 2011-09-19 | Discharge: 2011-09-19 | Discharge status: Against Medical Advice | Payer: Medicare Other | Attending: Emergency Medicine | Admitting: Emergency Medicine

## 2011-09-19 ENCOUNTER — Other Ambulatory Visit: Payer: Self-pay

## 2011-09-19 ENCOUNTER — Emergency Department (HOSPITAL_COMMUNITY): Payer: Medicare Other

## 2011-09-19 ENCOUNTER — Inpatient Hospital Stay (HOSPITAL_COMMUNITY)
Admission: EM | Admit: 2011-09-19 | Discharge: 2011-09-30 | DRG: 291 | Disposition: A | Discharge status: Home Health | Payer: Medicare Other | Attending: Internal Medicine | Admitting: Internal Medicine

## 2011-09-19 DIAGNOSIS — M109 Gout, unspecified: Secondary | ICD-10-CM | POA: Diagnosis present

## 2011-09-19 DIAGNOSIS — D649 Anemia, unspecified: Secondary | ICD-10-CM

## 2011-09-19 DIAGNOSIS — E440 Moderate protein-calorie malnutrition: Secondary | ICD-10-CM | POA: Diagnosis present

## 2011-09-19 DIAGNOSIS — I10 Essential (primary) hypertension: Secondary | ICD-10-CM | POA: Diagnosis present

## 2011-09-19 DIAGNOSIS — L89109 Pressure ulcer of unspecified part of back, unspecified stage: Secondary | ICD-10-CM | POA: Diagnosis not present

## 2011-09-19 DIAGNOSIS — Z79899 Other long term (current) drug therapy: Secondary | ICD-10-CM

## 2011-09-19 DIAGNOSIS — I472 Ventricular tachycardia, unspecified: Secondary | ICD-10-CM | POA: Diagnosis not present

## 2011-09-19 DIAGNOSIS — K219 Gastro-esophageal reflux disease without esophagitis: Secondary | ICD-10-CM | POA: Diagnosis present

## 2011-09-19 DIAGNOSIS — Z9861 Coronary angioplasty status: Secondary | ICD-10-CM

## 2011-09-19 DIAGNOSIS — Z951 Presence of aortocoronary bypass graft: Secondary | ICD-10-CM

## 2011-09-19 DIAGNOSIS — A498 Other bacterial infections of unspecified site: Secondary | ICD-10-CM | POA: Diagnosis present

## 2011-09-19 DIAGNOSIS — Z8701 Personal history of pneumonia (recurrent): Secondary | ICD-10-CM

## 2011-09-19 DIAGNOSIS — L8992 Pressure ulcer of unspecified site, stage 2: Secondary | ICD-10-CM | POA: Diagnosis not present

## 2011-09-19 DIAGNOSIS — I4729 Other ventricular tachycardia: Secondary | ICD-10-CM | POA: Diagnosis not present

## 2011-09-19 DIAGNOSIS — R0902 Hypoxemia: Secondary | ICD-10-CM | POA: Diagnosis not present

## 2011-09-19 DIAGNOSIS — Z880 Allergy status to penicillin: Secondary | ICD-10-CM

## 2011-09-19 DIAGNOSIS — Z8673 Personal history of transient ischemic attack (TIA), and cerebral infarction without residual deficits: Secondary | ICD-10-CM

## 2011-09-19 DIAGNOSIS — F068 Other specified mental disorders due to known physiological condition: Secondary | ICD-10-CM | POA: Diagnosis present

## 2011-09-19 DIAGNOSIS — I5023 Acute on chronic systolic (congestive) heart failure: Principal | ICD-10-CM | POA: Diagnosis present

## 2011-09-19 DIAGNOSIS — N390 Urinary tract infection, site not specified: Secondary | ICD-10-CM | POA: Diagnosis present

## 2011-09-19 DIAGNOSIS — I6529 Occlusion and stenosis of unspecified carotid artery: Secondary | ICD-10-CM | POA: Diagnosis present

## 2011-09-19 DIAGNOSIS — Z87440 Personal history of urinary (tract) infections: Secondary | ICD-10-CM

## 2011-09-19 DIAGNOSIS — J189 Pneumonia, unspecified organism: Secondary | ICD-10-CM | POA: Diagnosis not present

## 2011-09-19 DIAGNOSIS — R109 Unspecified abdominal pain: Secondary | ICD-10-CM | POA: Insufficient documentation

## 2011-09-19 DIAGNOSIS — I2510 Atherosclerotic heart disease of native coronary artery without angina pectoris: Secondary | ICD-10-CM | POA: Diagnosis present

## 2011-09-19 DIAGNOSIS — E785 Hyperlipidemia, unspecified: Secondary | ICD-10-CM | POA: Diagnosis present

## 2011-09-19 DIAGNOSIS — E261 Secondary hyperaldosteronism: Secondary | ICD-10-CM | POA: Diagnosis present

## 2011-09-19 DIAGNOSIS — D696 Thrombocytopenia, unspecified: Secondary | ICD-10-CM | POA: Diagnosis present

## 2011-09-19 DIAGNOSIS — G929 Unspecified toxic encephalopathy: Secondary | ICD-10-CM | POA: Diagnosis present

## 2011-09-19 DIAGNOSIS — I509 Heart failure, unspecified: Secondary | ICD-10-CM | POA: Diagnosis present

## 2011-09-19 DIAGNOSIS — K5900 Constipation, unspecified: Secondary | ICD-10-CM | POA: Diagnosis not present

## 2011-09-19 DIAGNOSIS — D509 Iron deficiency anemia, unspecified: Secondary | ICD-10-CM | POA: Diagnosis present

## 2011-09-19 DIAGNOSIS — E119 Type 2 diabetes mellitus without complications: Secondary | ICD-10-CM | POA: Diagnosis present

## 2011-09-19 DIAGNOSIS — I658 Occlusion and stenosis of other precerebral arteries: Secondary | ICD-10-CM | POA: Diagnosis present

## 2011-09-19 DIAGNOSIS — E871 Hypo-osmolality and hyponatremia: Secondary | ICD-10-CM | POA: Diagnosis present

## 2011-09-19 DIAGNOSIS — G92 Toxic encephalopathy: Secondary | ICD-10-CM | POA: Diagnosis present

## 2011-09-19 LAB — GLUCOSE, CAPILLARY: Glucose-Capillary: 93 mg/dL (ref 70–99)

## 2011-09-19 LAB — CULTURE, BLOOD (ROUTINE X 2)
Culture  Setup Time: 201212200214
Culture: NO GROWTH

## 2011-09-19 LAB — COMPREHENSIVE METABOLIC PANEL
ALT: 12 U/L (ref 0–35)
AST: 17 U/L (ref 0–37)
Albumin: 2.9 g/dL — ABNORMAL LOW (ref 3.5–5.2)
Calcium: 8.9 mg/dL (ref 8.4–10.5)
GFR calc Af Amer: 57 mL/min — ABNORMAL LOW (ref >90)
Glucose, Bld: 107 mg/dL — ABNORMAL HIGH (ref 70–99)
Potassium: 3.9 mEq/L (ref 3.5–5.1)
Sodium: 122 mEq/L — ABNORMAL LOW (ref 135–145)
Total Protein: 6.4 g/dL (ref 6.0–8.3)

## 2011-09-19 LAB — PROTIME-INR: INR: 0.92 (ref 0.00–1.49)

## 2011-09-19 LAB — CBC
Hemoglobin: 9.2 g/dL — ABNORMAL LOW (ref 12.0–15.0)
MCHC: 32.1 g/dL (ref 30.0–36.0)
RDW: 19.3 % — ABNORMAL HIGH (ref 11.5–15.5)
WBC: 6.7 10*3/uL (ref 4.0–10.5)

## 2011-09-19 LAB — URINALYSIS, ROUTINE W REFLEX MICROSCOPIC
Ketones, ur: NEGATIVE mg/dL
Leukocytes, UA: NEGATIVE
Nitrite: NEGATIVE
Protein, ur: NEGATIVE mg/dL
pH: 6 (ref 5.0–8.0)

## 2011-09-19 MED ORDER — ONDANSETRON HCL 4 MG/2ML IJ SOLN: 4.0000 mg | Freq: Four times a day (QID) | INTRAMUSCULAR | Status: DC | PRN

## 2011-09-19 MED ORDER — ONDANSETRON HCL 4 MG/2ML IJ SOLN
4.0000 mg | Freq: Three times a day (TID) | INTRAMUSCULAR | Status: DC | PRN
  Administered 2011-09-19: 4 mg via INTRAVENOUS
  Filled 2011-09-19: qty 2

## 2011-09-19 MED ORDER — EZETIMIBE-SIMVASTATIN 10-20 MG PO TABS
1.0000 | ORAL_TABLET | Freq: Every day | ORAL | Status: DC
  Administered 2011-09-20 – 2011-09-29 (×10): 1 via ORAL
  Filled 2011-09-19 (×12): qty 1

## 2011-09-19 MED ORDER — SENNA 8.6 MG PO TABS
1.0000 | ORAL_TABLET | Freq: Two times a day (BID) | ORAL | Status: DC
  Administered 2011-09-19 – 2011-09-25 (×12): 8.6 mg via ORAL
  Filled 2011-09-19 (×14): qty 1

## 2011-09-19 MED ORDER — SODIUM CHLORIDE 0.9 % IV SOLN: INTRAVENOUS | Status: DC

## 2011-09-19 MED ORDER — ISOSORBIDE MONONITRATE ER 30 MG PO TB24
30.0000 mg | ORAL_TABLET | Freq: Every day | ORAL | Status: DC
  Administered 2011-09-20 – 2011-09-30 (×11): 30 mg via ORAL
  Filled 2011-09-19 (×12): qty 1

## 2011-09-19 MED ORDER — INSULIN ASPART 100 UNIT/ML ~~LOC~~ SOLN
0.0000 [IU] | Freq: Three times a day (TID) | SUBCUTANEOUS | Status: DC
  Administered 2011-09-23 – 2011-09-29 (×2): 2 [IU] via SUBCUTANEOUS
  Filled 2011-09-19: qty 3

## 2011-09-19 MED ORDER — METOPROLOL TARTRATE 12.5 MG HALF TABLET
12.5000 mg | ORAL_TABLET | Freq: Two times a day (BID) | ORAL | Status: DC
  Administered 2011-09-19 – 2011-09-30 (×19): 12.5 mg via ORAL
  Filled 2011-09-19 (×25): qty 1

## 2011-09-19 MED ORDER — TORSEMIDE 20 MG PO TABS
20.0000 mg | ORAL_TABLET | Freq: Two times a day (BID) | ORAL | Status: DC
  Administered 2011-09-19: 20 mg via ORAL
  Filled 2011-09-19 (×3): qty 1

## 2011-09-19 MED ORDER — SODIUM CHLORIDE 0.9 % IV SOLN: 250.0000 mL | INTRAVENOUS | Status: DC | PRN

## 2011-09-19 MED ORDER — SODIUM CHLORIDE 0.9 % IJ SOLN
3.0000 mL | Freq: Two times a day (BID) | INTRAMUSCULAR | Status: DC
  Administered 2011-09-20 – 2011-09-29 (×19): 3 mL via INTRAVENOUS

## 2011-09-19 MED ORDER — SODIUM CHLORIDE 0.9 % IJ SOLN: 3.0000 mL | INTRAMUSCULAR | Status: DC | PRN

## 2011-09-19 MED ORDER — INSULIN ASPART 100 UNIT/ML ~~LOC~~ SOLN: 0.0000 [IU] | Freq: Every day | SUBCUTANEOUS | Status: DC

## 2011-09-19 MED ORDER — CLOPIDOGREL BISULFATE 75 MG PO TABS
75.0000 mg | ORAL_TABLET | Freq: Every day | ORAL | Status: DC
  Administered 2011-09-20 – 2011-09-30 (×11): 75 mg via ORAL
  Filled 2011-09-19 (×12): qty 1

## 2011-09-19 MED ORDER — ENOXAPARIN SODIUM 40 MG/0.4ML ~~LOC~~ SOLN
40.0000 mg | SUBCUTANEOUS | Status: DC
  Administered 2011-09-19 – 2011-09-29 (×11): 40 mg via SUBCUTANEOUS
  Filled 2011-09-19 (×13): qty 0.4

## 2011-09-19 MED ORDER — PREGABALIN 75 MG PO CAPS
75.0000 mg | ORAL_CAPSULE | Freq: Every day | ORAL | Status: DC
  Administered 2011-09-20 – 2011-09-30 (×11): 75 mg via ORAL
  Filled 2011-09-19 (×10): qty 1

## 2011-09-19 MED ORDER — PANTOPRAZOLE SODIUM 40 MG PO TBEC
40.0000 mg | DELAYED_RELEASE_TABLET | Freq: Every day | ORAL | Status: DC
  Administered 2011-09-20 – 2011-09-30 (×10): 40 mg via ORAL
  Filled 2011-09-19 (×13): qty 1

## 2011-09-19 MED ORDER — ALLOPURINOL 100 MG PO TABS
100.0000 mg | ORAL_TABLET | Freq: Every day | ORAL | Status: DC
  Administered 2011-09-20 – 2011-09-30 (×11): 100 mg via ORAL
  Filled 2011-09-19 (×12): qty 1

## 2011-09-19 MED ORDER — LISINOPRIL 2.5 MG PO TABS
2.5000 mg | ORAL_TABLET | Freq: Every day | ORAL | Status: DC
  Administered 2011-09-20 – 2011-09-25 (×5): 2.5 mg via ORAL
  Filled 2011-09-19 (×7): qty 1

## 2011-09-19 MED ORDER — ACETAMINOPHEN 325 MG PO TABS
650.0000 mg | ORAL_TABLET | ORAL | Status: DC | PRN
  Filled 2011-09-19 (×2): qty 2

## 2011-09-19 MED ORDER — HYDROMORPHONE HCL PF 1 MG/ML IJ SOLN
1.0000 mg | INTRAMUSCULAR | Status: DC | PRN
  Administered 2011-09-19: 1 mg via INTRAVENOUS
  Filled 2011-09-19: qty 1

## 2011-09-19 MED ORDER — PRIMIDONE 50 MG PO TABS
50.0000 mg | ORAL_TABLET | Freq: Every day | ORAL | Status: DC
  Administered 2011-09-20 – 2011-09-30 (×11): 50 mg via ORAL
  Filled 2011-09-19 (×12): qty 1

## 2011-09-19 MED ORDER — COLCHICINE 0.6 MG PO TABS: 0.6000 mg | ORAL_TABLET | Freq: Two times a day (BID) | ORAL | Status: DC | PRN

## 2011-09-19 MED ORDER — POTASSIUM CHLORIDE CRYS ER 20 MEQ PO TBCR
20.0000 meq | EXTENDED_RELEASE_TABLET | Freq: Two times a day (BID) | ORAL | Status: DC
  Administered 2011-09-19 – 2011-09-23 (×8): 20 meq via ORAL
  Filled 2011-09-19 (×9): qty 1

## 2011-09-19 NOTE — ED Notes (Signed)
Family at bedside states that patient has not voided any urine since 0600 this morning. Family states that at home nurse said that patient has increased swelling in legs and feet. Bladder Scan initiated and resulted in >932ml. MD Jeraldine Loots and RN Donell Beers aware.

## 2011-09-19 NOTE — ED Notes (Signed)
Attempted to call report will return call

## 2011-09-19 NOTE — ED Notes (Signed)
ivf d/c'd  Report called to [employer].  Charge nurse notified that due to religious reasons they want a female nurse tech

## 2011-09-19 NOTE — H&P (Addendum)
PCP:   Florentina Jenny, MD   Chief Complaint:  Lethargy  HPI: This is a 77 year old arabic female with a history of systolic CHF, diabetes mellitus, hypertension and, recent hospital physician for urinary tract infection from 1219 to 12/23. She was noted to have systolic CHF on last admission with an EF of 40-45%. Since her discharge she has continued to have some mild lethargy and which has worsened today. Her daughter, who provides a history, denies fevers, chills, nausea, vomiting. There is unable to weigh the patient due to the patient's immobility, but she has been having worsening peripheral edema. The patient's water consumption is between 2 and 3 L a day. The patient that has consisted mainly of soup.    Review of Systems:  The patient denies anorexia, fever, weight loss,, vision loss, decreased hearing, hoarseness, chest pain, syncope, dyspnea on exertion, peripheral edema, balance deficits, hemoptysis, abdominal pain, melena, hematochezia, severe indigestion/heartburn, hematuria, incontinence, genital sores, muscle weakness, suspicious skin lesions, transient blindness, difficulty walking, depression, unusual weight change, abnormal bleeding, enlarged lymph nodes, angioedema, and breast masses.  Past Medical History: Past Medical History  Diagnosis Date  . Diabetes mellitus   . Hypertension   . Stroke Right Side Weakness   Past Surgical History  Procedure Date  . Cardiac surgery   . Coronary artery bypass graft     Medications: Prior to Admission medications   Medication Sig Start Date End Date Taking? Authorizing Provider  acetaminophen (TYLENOL) 500 MG tablet Take 1,000 mg by mouth every 6 (six) hours as needed. pain    Yes Historical Provider, MD  allopurinol (ZYLOPRIM) 100 MG tablet Take 100 mg by mouth daily.     Yes Historical Provider, MD  chlorpheniramine-HYDROcodone (TUSSIONEX) 10-8 MG/5ML LQCR Take 5 mLs by mouth every 12 (twelve) hours as needed. 09/16/11  Yes Novlet  Jarrett-Davis  clopidogrel (PLAVIX) 75 MG tablet Take 75 mg by mouth daily.     Yes Historical Provider, MD  colchicine 0.6 MG tablet Take 0.6 mg by mouth 2 (two) times daily as needed.     Yes Historical Provider, MD  esomeprazole (NEXIUM) 40 MG capsule Take 40 mg by mouth daily before breakfast.     Yes Historical Provider, MD  ezetimibe-simvastatin (VYTORIN) 10-20 MG per tablet Take 1 tablet by mouth at bedtime.     Yes Historical Provider, MD  folic acid (FOLVITE) 1 MG tablet Take 1 mg by mouth daily.     Yes Historical Provider, MD  isosorbide mononitrate (IMDUR) 30 MG 24 hr tablet Take 30 mg by mouth daily.     Yes Historical Provider, MD  levofloxacin (LEVAQUIN) 500 MG tablet Take 250 mg by mouth daily.   09/16/11 09/26/11 Yes Novlet Jarrett-Davis  lisinopril (PRINIVIL,ZESTRIL) 2.5 MG tablet Take 1 tablet (2.5 mg total) by mouth daily. 09/16/11 09/15/12 Yes Novlet Jarrett-Davis  metoprolol (LOPRESSOR) 50 MG tablet Take 1 tablet (50 mg total) by mouth daily. 09/16/11  Yes Novlet Jarrett-Davis  polysaccharide iron (NIFEREX) 150 MG CAPS capsule Take 150 mg by mouth 2 (two) times daily.     Yes Historical Provider, MD  pregabalin (LYRICA) 75 MG capsule Take 75 mg by mouth daily.     Yes Historical Provider, MD  primidone (MYSOLINE) 50 MG tablet Take 50 mg by mouth daily.     Yes Historical Provider, MD  senna (SENOKOT) 8.6 MG tablet Take 1 tablet by mouth 2 (two) times daily.     Yes Historical Provider, MD  torsemide (DEMADEX) 20 MG  tablet Take 20 mg by mouth daily.     Yes Historical Provider, MD    Allergies:   Allergies  Allergen Reactions  . Penicillins Itching and Swelling    Social History:  reports that she has never smoked. She has never used smokeless tobacco. She reports that she does not drink alcohol or use illicit drugs.  Family History: No family history on file.  Physical Exam: Filed Vitals:   09/19/11 1547 09/19/11 1550 09/19/11 1938  BP: 108/48  96/60  Pulse: 65   64  Temp: 98.3 F (36.8 C)  97.5 F (36.4 C)  TempSrc: Oral  Oral  Resp: 18  14  SpO2: 97% 96% 100%   General appearance: alert, cooperative and no distress Head: Normocephalic, without obvious abnormality, atraumatic Eyes: conjunctivae/corneas clear. PERRL, EOM's intact. Fundi benign. Neck: JVD - 7 cm above sternal notch, no adenopathy, no carotid bruit, supple, symmetrical, trachea midline and thyroid not enlarged, symmetric, no tenderness/mass/nodules Resp: rales bibasilar Cardio: regular rate and rhythm, S1, S2 normal, no murmur, click, rub or gallop GI: Distended abdomen, nontender. No masses palpated. No organomegaly. Extremities: edema To midcalf bilaterally. Pulses: 2+ and symmetric Skin: Skin color, texture, turgor normal. No rashes or lesions Lymph nodes: Cervical, supraclavicular, and axillary nodes normal.   Labs on Admission:   [facility] 09/19/11 1640  NA 122*  K 3.9  CL 86*  CO2 30  GLUCOSE 107*  BUN 29*  CREATININE 1.06  CALCIUM 8.9  MG --  PHOS --    Basename 09/19/11 1640  AST 17  ALT 12  ALKPHOS 86  BILITOT 0.3  PROT 6.4  ALBUMIN 2.9*   No results found for this basename: LIPASE:2,AMYLASE:2 in the last 72 hours  Basename 09/19/11 1640  WBC 6.7  NEUTROABS --  HGB 9.2*  HCT 28.7*  MCV 82.0  PLT 141*   No results found for this basename: CKTOTAL:3,CKMB:3,CKMBINDEX:3,TROPONINI:3 in the last 72 hours No results found for this basename: TSH,T4TOTAL,FREET3,T3FREE,THYROIDAB in the last 72 hours No results found for this basename: VITAMINB12:2,FOLATE:2,FERRITIN:2,TIBC:2,IRON:2,RETICCTPCT:2 in the last 72 hours  Radiological Exams on Admission: Dg Chest 2 View  09/12/2011  *RADIOLOGY REPORT*  Clinical Data: Altered mental status.  Fever and cough  CHEST - 2 VIEW  Comparison: 06/08/2011  Findings: Changes of CABG.  Mild cardiac enlargement.  Mild vascular congestion.  Negative for edema or effusion.  Mild bibasilar atelectasis.  No definite  pneumonia.  IMPRESSION: Cardiac enlargement with mild vascular congestion.  Mild bibasilar atelectasis.  Original Report Authenticated By: Camelia Phenes, M.D.   Dg Chest Port 1 View  09/19/2011  *RADIOLOGY REPORT*  Clinical Data: Weakness and confusion, history of bypass  PORTABLE CHEST - 1 VIEW  Comparison: 09/12/2011; 06/08/2011; 10/27/2004  Findings: Unchanged enlarged cardiac silhouette and mediastinal contours post median sternotomy and CABG.  Pulmonary vasculature remains indistinct with cephalization of flow.  Grossly unchanged bibasilar heterogeneous opacities.  No new focal airspace opacities.  No pleural effusion or pneumothorax.  Unchanged bones.  IMPRESSION: Unchanged findings of mild pulmonary edema.  Original Report Authenticated By: Waynard Reeds, M.D.    Assessment/Plan #1 hyponatremia #2 CHF exacerbation #3 diabetes type 2 #4 hypertension #5 chronic systolic heart failure  Admit the patient to telemetry for heart failure. The patient is fluid overloaded and the patient's hyponatremia is is due to her heart failure. We will fluid restrict the patient and restrict patient's sodium intake. We will reduce the patient's metoprolol due to systolic blood pressure in  the 90s.  We will diuresis the patient with torsemide. The patient is a full code. We'll start Lovenox for DVT prophylaxis.  Candelaria Celeste JEHIEL 09/19/2011, 8:44 PM

## 2011-09-19 NOTE — ED Notes (Signed)
Discharged on the 12/24 here today c/o abd pain

## 2011-09-19 NOTE — ED Notes (Signed)
Transferred to floor via strecher

## 2011-09-19 NOTE — ED Provider Notes (Signed)
History     CSN: [id number]  Arrival date & time 09/19/11  1543   First MD Initiated Contact with Patient 09/19/11 1559      Chief Complaint  Patient presents with  . Abdominal Pain    The patient's daughter acts as a Nurse, learning disability HPI The patient presents 3 days after discharge for an admission due to altered mental status. The patient's family notes that since discharge the patient has been progressively more listless. She has worsening anorexia, episodic emesis. No clear alleviating or exacerbating factors. Notably the patient has a history of CVA, is baseline nonambulatory. The patient's recent admission resulted in a diagnosis of hyponatremia and urinary tract infection.  The patient is not tolerating by mouth, and is incapable of taking her home medications. No fever, no chills, no diarrhea. + oliguria.  Past Medical History  Diagnosis Date  . Diabetes mellitus   . Hypertension   . Stroke Right Side Weakness    Past Surgical History  Procedure Date  . Cardiac surgery   . Coronary artery bypass graft     No family history on file.  History  Substance Use Topics  . Smoking status: Never Smoker   . Smokeless tobacco: Never Used  . Alcohol Use: No    OB History    Grav Para Term Preterm Abortions TAB SAB Ect Mult Living                  Review of Systems  Unable to perform ROS: Dementia    Allergies  Penicillins  Home Medications   Current Outpatient Rx  Name Route Sig Dispense Refill  . ACETAMINOPHEN 500 MG PO TABS Oral Take 1,000 mg by mouth every 6 (six) hours as needed. pain     . ALLOPURINOL 100 MG PO TABS Oral Take 100 mg by mouth daily.      Marland Kitchen HYDROCOD POLST-CHLORPHEN POLST 10-8 MG/5ML PO LQCR Oral Take 5 mLs by mouth every 12 (twelve) hours as needed. 140 mL 0  . CLOPIDOGREL BISULFATE 75 MG PO TABS Oral Take 75 mg by mouth daily.      . COLCHICINE 0.6 MG PO TABS Oral Take 0.6 mg by mouth 2 (two) times daily as needed.      Marland Kitchen ESOMEPRAZOLE MAGNESIUM  40 MG PO CPDR Oral Take 40 mg by mouth daily before breakfast.      . EZETIMIBE-SIMVASTATIN 10-20 MG PO TABS Oral Take 1 tablet by mouth at bedtime.      Marland Kitchen FOLIC ACID 1 MG PO TABS Oral Take 1 mg by mouth daily.      . ISOSORBIDE MONONITRATE ER 30 MG PO TB24 Oral Take 30 mg by mouth daily.      Marland Kitchen LEVOFLOXACIN 500 MG PO TABS Oral Take 250 mg by mouth daily.      Marland Kitchen LISINOPRIL 2.5 MG PO TABS Oral Take 1 tablet (2.5 mg total) by mouth daily. 30 tablet 0  . METOPROLOL TARTRATE 50 MG PO TABS Oral Take 1 tablet (50 mg total) by mouth daily. 30 tablet 0  . POLYSACCHARIDE IRON 150 MG PO CAPS Oral Take 150 mg by mouth 2 (two) times daily.      Marland Kitchen PREGABALIN 75 MG PO CAPS Oral Take 75 mg by mouth daily.      Marland Kitchen PRIMIDONE 50 MG PO TABS Oral Take 50 mg by mouth daily.      . SENNOSIDES 8.6 MG PO TABS Oral Take 1 tablet by mouth 2 (two) times  daily.      . TORSEMIDE 20 MG PO TABS Oral Take 20 mg by mouth daily.        BP 108/48  Pulse 65  Temp(Src) 98.3 F (36.8 C) (Oral)  Resp 18  SpO2 96%  Physical Exam  Nursing note and vitals reviewed. Constitutional: She is oriented to person, place, and time. She appears well-developed and well-nourished. No distress.  HENT:  Head: Normocephalic and atraumatic.  Eyes: Conjunctivae and EOM are normal.  Cardiovascular: Normal rate and regular rhythm.   Pulmonary/Chest: Effort normal. No stridor. No respiratory distress. She has decreased breath sounds. She has wheezes.  Abdominal: Soft. Normal appearance. She exhibits no distension. There is tenderness in the suprapubic area.  Musculoskeletal: She exhibits edema. She exhibits no tenderness.  Neurological: She is alert and oriented to person, place, and time. No cranial nerve deficit.  Skin: Skin is warm and dry.  Psychiatric: She has a normal mood and affect.    ED Course  Procedures (including critical care time)   Labs Reviewed  COMPREHENSIVE METABOLIC PANEL  LACTIC ACID, PLASMA  PROTIME-INR    URINALYSIS, ROUTINE W REFLEX MICROSCOPIC  CBC   No results found.   No diagnosis found.  Cardiac monitor 66, sinus rhythm normal  Pulse ox 99% on 2 L nasal cannula, abnormal    Date: 09/19/2011  Rate: 66  Rhythm: normal sinus rhythm  QRS Axis: normal  Intervals: normal  ST/T Wave abnormalities: nonspecific T wave changes  Conduction Disutrbances:first-degree A-V block   Narrative Interpretation:   Old EKG Reviewed: changes noted ABNORMAL   CXR: reviewed by me, pulm congestion MDM  This elderly, Arabic speaking female now presents with persistent anorexia, nausea, fatigue following a recent admission for urinary tract infection. On exam the patient is listless, though in no distress with unremarkable vital signs. The patient's presentation is notable for hyponatremia, and pulmonary edema suggesting undiagnosed CHF or renal dysfunction.  The patient's renal function on today's labs suggest dehydration. Given this mixed picture of her fluid status, the patient will be admitted for continued IV fluids with attention to sodium correction the   CRITICAL CARE Performed by: Gerhard Munch   Total critical care time: 35  Critical care time was exclusive of separately billable procedures and treating other patients.  Critical care was necessary to treat or prevent imminent or life-threatening deterioration.  Critical care was time spent personally by me on the following activities: development of treatment plan with patient and/or surrogate as well as nursing, discussions with consultants, evaluation of patient's response to treatment, examination of patient, obtaining history from patient or surrogate, ordering and performing treatments and interventions, ordering and review of laboratory studies, ordering and review of radiographic studies, pulse oximetry and re-evaluation of patient's condition.      Gerhard Munch, MD 09/19/11 [phone]

## 2011-09-20 LAB — GLUCOSE, CAPILLARY
Glucose-Capillary: 76 mg/dL (ref 70–99)
Glucose-Capillary: 139 mg/dL — ABNORMAL HIGH (ref 70–99)

## 2011-09-20 LAB — BASIC METABOLIC PANEL
GFR calc non Af Amer: 46 mL/min — ABNORMAL LOW (ref >90)
Glucose, Bld: 80 mg/dL (ref 70–99)
Potassium: 4.2 mEq/L (ref 3.5–5.1)
Sodium: 128 mEq/L — ABNORMAL LOW (ref 135–145)

## 2011-09-20 LAB — CARDIAC PANEL(CRET KIN+CKTOT+MB+TROPI): Relative Index: INVALID (ref 0.0–2.5)

## 2011-09-20 MED ORDER — FUROSEMIDE 10 MG/ML IJ SOLN
40.0000 mg | Freq: Two times a day (BID) | INTRAMUSCULAR | Status: DC
  Administered 2011-09-20 – 2011-09-21 (×3): 40 mg via INTRAVENOUS
  Filled 2011-09-20 (×6): qty 4

## 2011-09-20 MED ORDER — BISACODYL 10 MG RE SUPP
10.0000 mg | Freq: Once | RECTAL | Status: AC
  Administered 2011-09-20: 10 mg via RECTAL
  Filled 2011-09-20 (×2): qty 1

## 2011-09-20 NOTE — Progress Notes (Signed)
Dr Brien Few text paged to inform him of pt refusing lab draw.  Daughter feels that she will let lab stick her when her other daughter gets here.  Lab work rescheduled for 1800 today. Dr informed.

## 2011-09-20 NOTE — Progress Notes (Signed)
Pt refused to let lab draw her blood.  Spoke with pt daughter and she states maybe she will let them when her other daughter gets here lab called and informed.  Orders for lab work modified per pt and her daughters request.

## 2011-09-20 NOTE — Consult Note (Addendum)
Reason for Consult:Family  And MD request for CHF, acute on chronic systolic HF.   Referring Physician: Dr. Henriette Combs is an 77 y.o. female.    Chief Complaint:  Admitted with increased lethargy and increased peripheral edema.  HPI: This is a 77 year old arabic female with a history of systolic CHF, diabetes mellitus, hypertension and, recent hospital physician for urinary tract infection from 12/19 to 12/23. She was noted to have systolic CHF on last admission with an EF of 40-45%. Since her discharge she has continued to have some mild lethargy and which had worsened on day of admit. Her daughter, who provided a history, denied fevers, chills, nausea, vomiting. Unable to weigh the patient at home due to the patient's immobility, but she has been having worsening peripheral edema. The patient's water consumption is between 2 and 3 L a day  that has mostly  consisted  of soup.  EKG SR without acute changes.  PCXR unchanged findings of mild pul. Edema.  2D echo of 09/13/11: Left ventricle: The cavity size was normal. There was moderate concentric hypertrophy. Systolic function was mildly to moderately reduced. The estimated ejection fraction was in the range of 40% to 45%. There is posterior basal and basal to mid inferior hypokinesis to akinesis. Doppler parameters are consistent with abnormal left ventricular relaxation (grade 1 diastolic dysfunction). The E/e' ratio is >10, suggesting elevated LV filling pressure. - Mitral valve: Mildly thickened leaflets . There appears to be tethering of the posterior mitral leaflet. There is mild to moderate posteriorly directed mitral regurgitation. - Left atrium: The atrium was at the upper limits of normal in size. - Inferior vena cava: The vessel was normal in size; the respirophasic diameter changes were in the normal range (= 50%); findings are consistent with normal central venous pressure.    Hx. Of CAD with CABG June  2000.  In Jan. 2006 she had subtotal occluded SVG to RCA and underwent stenting with resumption of normal flow.  Also in that time frame she suffered a TIA/CVA though she has no residual.  Hx. Of UTIs. Hx. Rt. Knee gout.   Hx. Of anemia.   Past Medical History  Diagnosis Date  . Diabetes mellitus   . Hypertension   . Stroke Right Side Weakness    Past Surgical History  Procedure Date  . Cardiac surgery   . Coronary artery bypass graft     No family history on file. Social History:  reports that she has never smoked. She has never used smokeless tobacco. She reports that she does not drink alcohol or use illicit drugs.Married, 6 children, 10 grandchildren.   Allergies:  Allergies  Allergen Reactions  . Penicillins Itching and Swelling    Medications Prior to Admission  Medication Dose Route Frequency Provider Last Rate Last Dose  . 0.9 %  sodium chloride infusion  250 mL Intravenous PRN Karyl Kinnier Stinson, DO      . acetaminophen (TYLENOL) tablet 650 mg  650 mg Oral Q4H PRN Karyl Kinnier Stinson, DO      . allopurinol (ZYLOPRIM) tablet 100 mg  100 mg Oral Daily Karyl Kinnier Stinson, DO   100 mg at 09/20/11 1006  . bisacodyl (DULCOLAX) suppository 10 mg  10 mg Rectal Once Christopher Oti   10 mg at 09/20/11 1450  . clopidogrel (PLAVIX) tablet 75 mg  75 mg Oral Daily Karyl Kinnier [place], DO   75 mg at 09/20/11 1005  . colchicine tablet 0.6 mg  0.6 mg Oral BID PRN Karyl Kinnier Stinson, DO      . enoxaparin (LOVENOX) injection 40 mg  40 mg Subcutaneous Q24H Karyl Kinnier [place], DO   40 mg at 09/19/11 2336  . ezetimibe-simvastatin (VYTORIN) 10-20 MG per tablet 1 tablet  1 tablet Oral QHS Karyl Kinnier Stinson, DO      . furosemide (LASIX) injection 40 mg  40 mg Intravenous BID Christopher Oti   40 mg at 09/20/11 1004  . insulin aspart (novoLOG) injection 0-15 Units  0-15 Units Subcutaneous TID WC [facility], DO      . insulin aspart (novoLOG) injection 0-5 Units  0-5 Units  Subcutaneous QHS Karyl Kinnier Stinson, DO      . isosorbide mononitrate (IMDUR) 24 hr tablet 30 mg  30 mg Oral Daily Karyl Kinnier Stinson, DO   30 mg at 09/20/11 1006  . lisinopril (PRINIVIL,ZESTRIL) tablet 2.5 mg  2.5 mg Oral Daily Karyl Kinnier Stinson, DO   2.5 mg at 09/20/11 1007  . metoprolol tartrate (LOPRESSOR) tablet 12.5 mg  12.5 mg Oral BID Karyl Kinnier Stinson, DO   12.5 mg at 09/20/11 1006  . ondansetron (ZOFRAN) injection 4 mg  4 mg Intravenous Q6H PRN Karyl Kinnier Stinson, DO      . pantoprazole (PROTONIX) EC tablet 40 mg  40 mg Oral Daily Karyl Kinnier Stinson, DO   40 mg at 09/20/11 1010  . potassium chloride SA (K-DUR,KLOR-CON) CR tablet 20 mEq  20 mEq Oral BID Karyl Kinnier Stinson, DO   20 mEq at 09/20/11 1005  . pregabalin (LYRICA) capsule 75 mg  75 mg Oral Daily Karyl Kinnier [place], DO   75 mg at 09/20/11 1005  . primidone (MYSOLINE) tablet 50 mg  50 mg Oral Daily Karyl Kinnier Stinson, DO   50 mg at 09/20/11 1007  . senna (SENOKOT) tablet 8.6 mg  1 tablet Oral BID Karyl Kinnier [place]inson, DO   8.6 mg at 09/20/11 1006  . sodium chloride 0.9 % injection 3 mL  3 mL Intravenous Q12H Karyl Kinnier Stinson, DO   3 mL at 09/20/11 1011  . sodium chloride 0.9 % injection 3 mL  3 mL Intravenous PRN Karyl Kinnier Stinson, DO      . DISCONTD: 0.9 %  sodium chloride infusion   Intravenous Continuous Gerhard Munch, MD      . DISCONTD: 0.9 %  sodium chloride infusion   Intravenous STAT Gerhard Munch, MD      . DISCONTD: HYDROmorphone (DILAUDID) injection 1 mg  1 mg Intravenous Q4H PRN Gerhard Munch, MD   1 mg at 09/19/11 1823  . DISCONTD: ondansetron (ZOFRAN) injection 4 mg  4 mg Intravenous Q8H PRN Gerhard Munch, MD   4 mg at 09/19/11 1824  . DISCONTD: torsemide (DEMADEX) tablet 20 mg  20 mg Oral BID Karyl Kinnier [place], DO   20 mg at 09/19/11 2337   Medications Prior to Admission  Medication Sig Dispense Refill  . acetaminophen (TYLENOL) 500 MG tablet Take 1,000 mg by mouth every 6  (six) hours as needed. pain       . allopurinol (ZYLOPRIM) 100 MG tablet Take 100 mg by mouth daily.        . chlorpheniramine-HYDROcodone (TUSSIONEX) 10-8 MG/5ML LQCR Take 5 mLs by mouth every 12 (twelve) hours as needed.  140 mL  0  . clopidogrel (PLAVIX) 75 MG tablet Take 75 mg by mouth daily.        . colchicine 0.6 MG tablet Take  0.6 mg by mouth 2 (two) times daily as needed.        Marland Kitchen esomeprazole (NEXIUM) 40 MG capsule Take 40 mg by mouth daily before breakfast.        . ezetimibe-simvastatin (VYTORIN) 10-20 MG per tablet Take 1 tablet by mouth at bedtime.        . folic acid (FOLVITE) 1 MG tablet Take 1 mg by mouth daily.        . isosorbide mononitrate (IMDUR) 30 MG 24 hr tablet Take 30 mg by mouth daily.        Marland Kitchen levofloxacin (LEVAQUIN) 500 MG tablet Take 250 mg by mouth daily.        Marland Kitchen lisinopril (PRINIVIL,ZESTRIL) 2.5 MG tablet Take 1 tablet (2.5 mg total) by mouth daily.  30 tablet  0  . metoprolol (LOPRESSOR) 50 MG tablet Take 1 tablet (50 mg total) by mouth daily.  30 tablet  0  . polysaccharide iron (NIFEREX) 150 MG CAPS capsule Take 150 mg by mouth 2 (two) times daily.        . pregabalin (LYRICA) 75 MG capsule Take 75 mg by mouth daily.        . primidone (MYSOLINE) 50 MG tablet Take 50 mg by mouth daily.        Marland Kitchen senna (SENOKOT) 8.6 MG tablet Take 1 tablet by mouth 2 (two) times daily.        Marland Kitchen torsemide (DEMADEX) 20 MG tablet Take 20 mg by mouth daily.          Results for orders placed during the hospital encounter of 09/19/11 (from the past 48 hour(s))  COMPREHENSIVE METABOLIC PANEL     Status: Abnormal   Collection Time   09/19/11  4:40 PM      Component Value Range Comment   Sodium 122 (*) 135 - 145 (mEq/L)    Potassium 3.9  3.5 - 5.1 (mEq/L)    Chloride 86 (*) 96 - 112 (mEq/L)    CO2 30  19 - 32 (mEq/L)    Glucose, Bld 107 (*) 70 - 99 (mg/dL)    BUN 29 (*) 6 - 23 (mg/dL)    Creatinine, Ser 1.61  0.50 - 1.10 (mg/dL)    Calcium 8.9  8.4 - 10.5 (mg/dL)    Total  Protein 6.4  6.0 - 8.3 (g/dL)    Albumin 2.9 (*) 3.5 - 5.2 (g/dL)    AST 17  0 - 37 (U/L)    ALT 12  0 - 35 (U/L)    Alkaline Phosphatase 86  39 - 117 (U/L)    Total Bilirubin 0.3  0.3 - 1.2 (mg/dL)    GFR calc non Af Amer 49 (*) >90 (mL/min)    GFR calc Af Amer 57 (*) >90 (mL/min)   LACTIC ACID, PLASMA     Status: Normal   Collection Time   09/19/11  4:40 PM      Component Value Range Comment   Lactic Acid, Venous 1.5  0.5 - 2.2 (mmol/L)   PROTIME-INR     Status: Normal   Collection Time   09/19/11  4:40 PM      Component Value Range Comment   Prothrombin Time 12.6  11.6 - 15.2 (seconds)    INR 0.92  0.00 - 1.49    CBC     Status: Abnormal   Collection Time   09/19/11  4:40 PM      Component Value Range Comment   WBC 6.7  4.0 - 10.5 (K/uL)    RBC 3.50 (*) 3.87 - 5.11 (MIL/uL)    Hemoglobin 9.2 (*) 12.0 - 15.0 (g/dL)    HCT 21.3 (*) 08.6 - 46.0 (%)    MCV 82.0  78.0 - 100.0 (fL)    MCH 26.3  26.0 - 34.0 (pg)    MCHC 32.1  30.0 - 36.0 (g/dL)    RDW 57.8 (*) 46.9 - 15.5 (%)    Platelets 141 (*) 150 - 400 (K/uL)   URINALYSIS, ROUTINE W REFLEX MICROSCOPIC     Status: Abnormal   Collection Time   09/19/11  4:54 PM      Component Value Range Comment   Color, Urine YELLOW  YELLOW     APPearance CLOUDY (*) CLEAR     Specific Gravity, Urine 1.008  1.005 - 1.030     pH 6.0  5.0 - 8.0     Glucose, UA NEGATIVE  NEGATIVE (mg/dL)    Hgb urine dipstick NEGATIVE  NEGATIVE     Bilirubin Urine NEGATIVE  NEGATIVE     Ketones, ur NEGATIVE  NEGATIVE (mg/dL)    Protein, ur NEGATIVE  NEGATIVE (mg/dL)    Urobilinogen, UA 0.2  0.0 - 1.0 (mg/dL)    Nitrite NEGATIVE  NEGATIVE     Leukocytes, UA NEGATIVE  NEGATIVE  MICROSCOPIC NOT DONE ON URINES WITH NEGATIVE PROTEIN, BLOOD, LEUKOCYTES, NITRITE, OR GLUCOSE <1000 mg/dL.  GLUCOSE, CAPILLARY     Status: Normal   Collection Time   09/19/11 10:10 PM      Component Value Range Comment   Glucose-Capillary 93  70 - 99 (mg/dL)    Comment 1 Notify RN      BASIC METABOLIC PANEL     Status: Abnormal   Collection Time   09/20/11  4:55 AM      Component Value Range Comment   Sodium 128 (*) 135 - 145 (mEq/L)    Potassium 4.2  3.5 - 5.1 (mEq/L)    Chloride 92 (*) 96 - 112 (mEq/L)    CO2 28  19 - 32 (mEq/L)    Glucose, Bld 80  70 - 99 (mg/dL)    BUN 27 (*) 6 - 23 (mg/dL)    Creatinine, Ser 6.29 (*) 0.50 - 1.10 (mg/dL)    Calcium 8.6  8.4 - 10.5 (mg/dL)    GFR calc non Af Amer 46 (*) >90 (mL/min)    GFR calc Af Amer 53 (*) >90 (mL/min)   PRO B NATRIURETIC PEPTIDE     Status: Normal   Collection Time   09/20/11  4:55 AM      Component Value Range Comment   Pro B Natriuretic peptide (BNP) 353.3  0 - 450 (pg/mL)   GLUCOSE, CAPILLARY     Status: Normal   Collection Time   09/20/11  7:49 AM      Component Value Range Comment   Glucose-Capillary 76  70 - 99 (mg/dL)   GLUCOSE, CAPILLARY     Status: Abnormal   Collection Time   09/20/11 12:30 PM      Component Value Range Comment   Glucose-Capillary 140 (*) 70 - 99 (mg/dL)    Comment 1 Notify RN      Dg Chest Port 1 View  09/19/2011  *RADIOLOGY REPORT*  Clinical Data: Weakness and confusion, history of bypass  PORTABLE CHEST - 1 VIEW  Comparison: 09/12/2011; 06/08/2011; 10/27/2004  Findings: Unchanged enlarged cardiac silhouette and mediastinal contours post median sternotomy and CABG.  Pulmonary  vasculature remains indistinct with cephalization of flow.  Grossly unchanged bibasilar heterogeneous opacities.  No new focal airspace opacities.  No pleural effusion or pneumothorax.  Unchanged bones.  IMPRESSION: Unchanged findings of mild pulmonary edema.  Original Report Authenticated By: Waynard Reeds, M.D.    ROS:  General: no fever, no wt. Loss +weakness. HEENT: No vision loss no hearing problems Skin: no rashes. Cardiovascular: no chest pain Pulmonary:  No SOB GI: no diarrhea, no melena GU: no dysuria MS: no problems Neuro: lethargy       :Blood pressure 104/64, pulse 64,  temperature 97.9 F (36.6 C), temperature source Oral, resp. rate 18, height 5\' 3"  (1.6 m), weight 88 kg (194 lb 0.1 oz), SpO2 98.00%. PE: General :NCAT SKIN:dry HEENT: Neck: HEART:RRR w/o MGR Lungs:Clear but distant BS Abd: Ext:no edema Neuro:Intact   Assessment/Plan Patient Active Problem List  Diagnoses  . Altered mental status  . Hyponatremia  . Diabetes mellitus  . Pulmonary edema  . Bilateral carotid artery disease  . Hypertension  . GERD (gastroesophageal reflux disease)  . Iron deficiency anemia  . Hyperlipidemia  . Urinary tract infection  . CHF exacerbation  . Chronic systolic heart failure  . Thrombocytopenia   PLAN:  Continued hyponatremia Na 128 but up from 122.  secondary to hyperaldosteronism. Acute on chronic systolic heart failure receiving Lasix40 mg IV  Bid. With -997 yest. I find no weight for today. Pro-BNP 353 this am.  Anemia continues with Hgb at 9.2  Cr. Up from 1.06 to 1.12.  INGOLD,LAURA R 09/20/2011, 4:04 PM    Agree with note written by Nada Boozer RNP  Pt admitted yesterday with lethargy and failure to urinate. Pt of Dr.. TK. Known CAD s/p remote CABG and subsequent SVG PCI/Stent. LVEF 45 % in past. 2D here shows similar LV function. There is a big language barrier and it is difficult to determin exactly the course of events although it appears that she was recently D/C 3 days ago for treatment of an ecoli UTI. On admission her Serum NA was 122 now it is 129. Her BNP was 300 and her CXR showed no significant edema. She did apparently have BLE edema however she sits in a chair most of the day. She has been treated with iv lasix with neg fluid balance and decrease edema. SCr is nl. I doubt that she has significant CHF but suspect most of her symptoms are metabolic from recent infxn. She as nauseated at home ? Secondary to the levoquin and was drinking water and soup. I would transition her back to PO diuretics. Her urine is clean. Dr. Tresa Endo to  see tomorrow.   Runell Gess  09/20/2011 5:43 PM .

## 2011-09-20 NOTE — Progress Notes (Signed)
Subjective: Feel somewhat better today. No chest pain, no shortness of breath at rest.  Objective: Vital signs in last 24 hours: Temp:  [97.5 F (36.4 C)-98.3 F (36.8 C)] 97.9 F (36.6 C) (12/27 0606) Pulse Rate:  [63-67] 63  (12/27 0606) Resp:  [14-20] 20  (12/27 0606) BP: (96-141)/(42-84) 141/70 mmHg (12/27 0606) SpO2:  [96 %-100 %] 100 % (12/27 0606) Weight:  [88 kg (194 lb 0.1 oz)] 194 lb 0.1 oz (88 kg) (12/26 2211) Weight change:  Last BM Date: 09/15/11  Intake/Output from previous day: 12/26 0701 - 12/27 0700 In: 200 [P.O.:200] Out: 1197 [Urine:1197]     Physical Exam: General: Comfortable, alert, not short of breath at rest.  HEENT:  Mild clinical pallor, no jaundice, no conjunctival injection or discharge. NECK:  Supple, JVP not seen, no carotid bruits, no palpable lymphadenopathy, no palpable goiter. CHEST:  Bibasal crackles, no wheeze. HEART:  Sounds 1 and 2 heard, normal, regular, no murmurs. ABDOMEN:  Morbidly obese, non-tender, no palpable organomegaly, no palpable masses, normal bowel sounds. GENITALIA:  Not examined. LOWER EXTREMITIES:  Moderate pitting edema, palpable peripheral pulses. MUSCULOSKELETAL SYSTEM:  Generalized osteoarthritic changes. CENTRAL NERVOUS SYSTEM:  No focal neurologic deficit on gross examination.  Lab Results:  [facility] 09/19/11 1640  WBC 6.7  HGB 9.2*  HCT 28.7*  PLT 141*    Basename 09/20/11 0455 09/19/11 1640  NA 128* 122*  K 4.2 3.9  CL 92* 86*  CO2 28 30  GLUCOSE 80 107*  BUN 27* 29*  CREATININE 1.12* 1.06  CALCIUM 8.6 8.9   Recent Results (from the past 240 hour(s))  CULTURE, BLOOD (ROUTINE X 2)     Status: Normal   Collection Time   09/12/11  6:10 PM      Component Value Range Status Comment   Specimen Description BLOOD RIGHT ARM   Final    Special Requests BOTTLES DRAWN AEROBIC ONLY 3CC   Final    Setup Time [id number]   Final    Culture NO GROWTH 5 DAYS   Final    Report Status 09/19/2011 FINAL   Final    CULTURE, BLOOD (ROUTINE X 2)     Status: Normal   Collection Time   09/12/11  6:15 PM      Component Value Range Status Comment   Specimen Description BLOOD RIGHT HAND   Final    Special Requests BOTTLES DRAWN AEROBIC ONLY 3CC   Final    Setup Time [id number]   Final    Culture NO GROWTH 5 DAYS   Final    Report Status 09/19/2011 FINAL   Final   URINE CULTURE     Status: Normal   Collection Time   09/12/11  7:15 PM      Component Value Range Status Comment   Specimen Description URINE, CATHETERIZED   Final    Special Requests NONE   Final    Setup Time [id number]   Final    Colony Count >=100,000 COLONIES/ML   Final    Culture ESCHERICHIA COLI   Final    Report Status 09/14/2011 FINAL   Final    Organism ID, Bacteria ESCHERICHIA COLI   Final      Studies/Results: Dg Chest Port 1 View  09/19/2011  *RADIOLOGY REPORT*  Clinical Data: Weakness and confusion, history of bypass  PORTABLE CHEST - 1 VIEW  Comparison: 09/12/2011; 06/08/2011; 10/27/2004  Findings: Unchanged enlarged cardiac silhouette and mediastinal contours post median sternotomy and CABG.  Pulmonary vasculature  remains indistinct with cephalization of flow.  Grossly unchanged bibasilar heterogeneous opacities.  No new focal airspace opacities.  No pleural effusion or pneumothorax.  Unchanged bones.  IMPRESSION: Unchanged findings of mild pulmonary edema.  Original Report Authenticated By: Waynard Reeds, M.D.    Medications: Scheduled Meds:   . allopurinol  100 mg Oral Daily  . clopidogrel  75 mg Oral Daily  . enoxaparin  40 mg Subcutaneous Q24H  . ezetimibe-simvastatin  1 tablet Oral QHS  . furosemide  40 mg Intravenous BID  . insulin aspart  0-15 Units Subcutaneous TID WC  . insulin aspart  0-5 Units Subcutaneous QHS  . isosorbide mononitrate  30 mg Oral Daily  . lisinopril  2.5 mg Oral Daily  . metoprolol tartrate  12.5 mg Oral BID  . pantoprazole  40 mg Oral Daily  . potassium chloride  20 mEq Oral BID    . pregabalin  75 mg Oral Daily  . primidone  50 mg Oral Daily  . senna  1 tablet Oral BID  . sodium chloride  3 mL Intravenous Q12H  . DISCONTD: sodium chloride   Intravenous STAT  . DISCONTD: torsemide  20 mg Oral BID   Continuous Infusions:   . DISCONTD: sodium chloride     PRN Meds:.sodium chloride, acetaminophen, colchicine, ondansetron (ZOFRAN) IV, sodium chloride, DISCONTD:  HYDROmorphone (DILAUDID) injection, DISCONTD: ondansetron (ZOFRAN) IV  Assessment/Plan:  Active Problems: 1. CHF exacerbation: Patient has acute decompensation of chronic systolic CHF. We shall manage with intravenous Lasix for now, follow weights and proBNP. Cardiology consultation will be requested from Dr Nicki Guadalajara et al, to optimize heart failure therapy and ensure close follow up. Cycle cardiac enzymes for completeness. 2. Hyponatriemia: Likely due to secondary hyperaldosteronism. 3. DM-2: Controlled. Check HBA1C. 4. HTN: Reasonably controlled. Observe for now. Patient is already on beta-blocker, ACE-i and Nitrates.    LOS: 1 day   Vianney Kopecky,CHRISTOPHER 09/20/2011, 8:13 AM

## 2011-09-21 ENCOUNTER — Inpatient Hospital Stay (HOSPITAL_COMMUNITY): Payer: Medicare Other

## 2011-09-21 LAB — CARDIAC PANEL(CRET KIN+CKTOT+MB+TROPI)
CK, MB: 1.9 ng/mL (ref 0.3–4.0)
Relative Index: INVALID (ref 0.0–2.5)
Relative Index: INVALID (ref 0.0–2.5)
Total CK: 42 U/L (ref 7–177)
Troponin I: <0.3 ng/mL (ref <0.30)
Troponin I: <0.3 ng/mL (ref <0.30)

## 2011-09-21 LAB — CBC
MCH: 26.1 pg (ref 26.0–34.0)
MCHC: 31.5 g/dL (ref 30.0–36.0)
MCV: 82.8 fL (ref 78.0–100.0)
RBC: 3.14 MIL/uL — ABNORMAL LOW (ref 3.87–5.11)

## 2011-09-21 LAB — BASIC METABOLIC PANEL
BUN: 35 mg/dL — ABNORMAL HIGH (ref 6–23)
Creatinine, Ser: 1.04 mg/dL (ref 0.50–1.10)
GFR calc Af Amer: 58 mL/min — ABNORMAL LOW (ref >90)
GFR calc non Af Amer: 50 mL/min — ABNORMAL LOW (ref >90)
Glucose, Bld: 99 mg/dL (ref 70–99)
Potassium: 4.6 mEq/L (ref 3.5–5.1)

## 2011-09-21 LAB — GLUCOSE, CAPILLARY: Glucose-Capillary: 97 mg/dL (ref 70–99)

## 2011-09-21 LAB — HEMOGLOBIN A1C: Hgb A1c MFr Bld: 6.1 % — ABNORMAL HIGH (ref <5.7)

## 2011-09-21 MED ORDER — FUROSEMIDE 40 MG PO TABS
40.0000 mg | ORAL_TABLET | Freq: Two times a day (BID) | ORAL | Status: DC
  Administered 2011-09-22 – 2011-09-26 (×7): 40 mg via ORAL
  Filled 2011-09-21 (×10): qty 1

## 2011-09-21 NOTE — Progress Notes (Signed)
INITIAL ADULT NUTRITION ASSESSMENT Date: 09/21/2011   Time: 2:04 PM Reason for Assessment: Low braden  ASSESSMENT: Female 77 y.o.  Dx: Lethargy and increased peripheral edema  Hx:  Past Medical History  Diagnosis Date  . Diabetes mellitus   . Hypertension   . Stroke Right Side Weakness   Related Meds:  Scheduled Meds:   . allopurinol  100 mg Oral Daily  . bisacodyl  10 mg Rectal Once  . clopidogrel  75 mg Oral Daily  . enoxaparin  40 mg Subcutaneous Q24H  . ezetimibe-simvastatin  1 tablet Oral QHS  . furosemide  40 mg Intravenous BID  . insulin aspart  0-15 Units Subcutaneous TID WC  . insulin aspart  0-5 Units Subcutaneous QHS  . isosorbide mononitrate  30 mg Oral Daily  . lisinopril  2.5 mg Oral Daily  . metoprolol tartrate  12.5 mg Oral BID  . pantoprazole  40 mg Oral Daily  . potassium chloride  20 mEq Oral BID  . pregabalin  75 mg Oral Daily  . primidone  50 mg Oral Daily  . senna  1 tablet Oral BID  . sodium chloride  3 mL Intravenous Q12H   Continuous Infusions:  PRN Meds:.sodium chloride, acetaminophen, colchicine, ondansetron (ZOFRAN) IV, sodium chloride  Ht: 5\' 3"  (160 cm)  Wt: 194 lb 0.1 oz (88 kg)  Ideal Wt: 52.3kg % Ideal Wt: 168  Usual Wt: Unable to assess % Usual Wt: Unable to assess  Body mass index is 34.37 kg/(m^2).  Food/Nutrition Related Hx: Pt non-English speaking, daughter at bedside. Daughter reports pt typically "eats like a baby", eating small amounts of food throughout the day, mostly soups recently. Pt has gained around 2.5 pounds of likely fluid since last admission on 09/12/11. Daughter repots pt ate 1 tablespoon of eggs this morning. Daughter denies that pt has any difficulty swallowing.   DG of chest on 12/26 showed unchanged findings of mild pulmonary edema.   Labs:  CMP     Component Value Date/Time   NA 130* 09/21/2011 0700   K 4.6 09/21/2011 0700   CL 94* 09/21/2011 0700   CO2 30 09/21/2011 0700   GLUCOSE 99  09/21/2011 0700   BUN 35* 09/21/2011 0700   CREATININE 1.04 09/21/2011 0700   CALCIUM 9.0 09/21/2011 0700   PROT 6.4 09/19/2011 1640   ALBUMIN 2.9* 09/19/2011 1640   AST 17 09/19/2011 1640   ALT 12 09/19/2011 1640   ALKPHOS 86 09/19/2011 1640   BILITOT 0.3 09/19/2011 1640   GFRNONAA 50* 09/21/2011 0700   GFRAA 58* 09/21/2011 0700   CBG (last 3)   Basename 09/21/11 1134 09/21/11 0741 09/20/11 2127  GLUCAP 109* 97 130*   Lab Results  Component Value Date   HGBA1C 6.1* 09/20/2011    Intake/Output Summary (Last 24 hours) at 09/21/11 1412 Last data filed at 09/21/11 0055  Gross per 24 hour  Intake      0 ml  Output   2901 ml  Net  -2901 ml  Last BM - 12/28   Diet Order: Cardiac with fluid restriction   Estimated Nutritional Needs:   Kcal:1500-1700 Protein:60-80g Fluid:1.5-1.7L  NUTRITION DIAGNOSIS: -Inadequate oral intake (NI-2.1).  Status: Ongoing -Suspect pt with some moderate malnutrition PTA, however currently no weight history available, but family admits that pt with chronic poor intake  RELATED TO: poor appetite, small eater  AS EVIDENCE BY: daughter's statement, <25% meal intake  MONITORING/EVALUATION(Goals): Pt to consume >50% of meals/supplements.   EDUCATION  NEEDS: -No education needs identified at this time  INTERVENTION: Will order snacks per pt preference. Provided pt with chocolate Ensure to try which was within fluid restriction as pt did not consume any liquids on breakfast tray. Encouraged increased intake. Encouraged family to order additional snacks of foods pt likes. Will monitor.   Dietitian # [phone]  DOCUMENTATION CODES Per approved criteria  -Non-severe (moderate) malnutrition in the context of chronic illness -Obesity unspecified    Marshall Cork 09/21/2011, 2:04 PM

## 2011-09-21 NOTE — Clinical Documentation Improvement (Signed)
MALNUTRITION DOCUMENTATION CLARIFICATION  THIS DOCUMENT IS NOT A PERMANENT PART OF THE MEDICAL RECORD  TO RESPOND TO THE THIS QUERY, FOLLOW THE INSTRUCTIONS BELOW:  1. If needed, update documentation for the patient's encounter via the notes activity.  2. Access this query again and click edit on the In [employer].  3. After updating, or not, click F2 to complete all highlighted (required) fields concerning your review. Select "additional documentation in the medical record" OR "no additional documentation provided".  4. Click Sign note button.  5. The deficiency will fall out of your In Basket *Please let us know if you are not able to complete this workflow by phone or e-mail (listed below).  Please update your documentation within the medical record to reflect your response to this query.                                                                                        09/21/11   Dear Dr.Goodrich / Associates,  In a better effort to capture your patient's severity of illness, reflect appropriate length of stay and utilization of resources, a review of the patient medical record has revealed the following indicators.    Based on your clinical judgment, please clarify and document in a progress note and/or discharge summary the clinical condition associated with the following supporting information:  In responding to this query please exercise your independent judgment.  The fact that a query is asked, does not imply that any particular answer is desired or expected.   Please clarify /validate in progress notes /d/c summary nutritional consult assessment on 09/21/11 indicating pt with   Possible Clinical Conditions?    ___x____Moderate Malnutrition _______Severe Malnutrition      _______Other Condition________________ _______Cannot clinically determine     Nutrition Consult: 09/21/11   You may use possible, probable, or suspect with inpatient documentation.  possible, probable, suspected diagnoses MUST be documented at the time of discharge  Reviewed: additional documentation in the medical record  Thank You,  Andy Gauss RN   Clinical Documentation Specialist:  Pager [phone]  Health Information [facility] [place]

## 2011-09-21 NOTE — Progress Notes (Signed)
PROGRESS NOTE  [name] RUE:[id number] DOB: [birth year] DOA: 09/19/2011 PCP: Florentina Jenny, MD  Brief narrative: 77 year old woman recently discharged December 23 presented in December 26 with anorexia, nausea, fatigue and increased edema of the lower extremities and failure to thrive. Found to have urinary retention. Water consumption by report 2-3 L per day. Admitted for hyponatremia, acute systolic heart failure. Per son the patient has history of long-standing dementia. She is bedbound and since most of her days in a wheel chair watching TV or sleeping. She is usually quite interactive and conversant to. The last 10 days she is mostly just stared.  Discharged December 23: UTI, altered metal status  Past medical history: Diabetes mellitus, stroke, hypertension, CABG, systolic congestive heart failure with a left ventricular ejection fraction 40-45%, grade 1 diastolic dysfunction, advanced dementia.  Consultants:  Cardiology  Procedures:  None  Antibiotics:  None  Interim History: Interval documentation reviewed. Subjective: Patient does not answer questions. Her family is present to translate however she does not really interact except with one or 2 word answers.  Objective: Filed Vitals:   09/20/11 2200 09/21/11 0730 09/21/11 0800 09/21/11 1300  BP: 108/74 108/39 124/46 124/44  Pulse: 76 73  69  Temp: 98.6 F (37 C) 98.3 F (36.8 C)  98.9 F (37.2 C)  TempSrc: Oral Oral  Oral  Resp: 18 18  18   Height:      Weight:      SpO2: 98% 100%  97%    Intake/Output Summary (Last 24 hours) at 09/21/11 1615 Last data filed at 09/21/11 1421  Gross per 24 hour  Intake    240 ml  Output   2501 ml  Net  -2261 ml    Exam:  General: Appears mildly ill. Eyes are open but she does not really interact with her family. Cardiovascular: Regular rate and rhythm. No murmur, rub or gallop. Minimal lower extremity edema. Respiratory: Clear to auscultation bilaterally. No  wheezes, rales or rhonchi. Respiratory effort. Abdomen: Soft, nontender nondistended. Skin: Appears grossly unremarkable. Musculoskeletal: Grip strength bilaterally 5/5. Does not really move legs to command.  Data Reviewed: Basic Metabolic Panel:  Lab 09/21/11 1478 09/20/11 0455 09/19/11 1640 09/16/11 0852 09/15/11 0352  NA 130* 128* 122* 131* 135  K 4.6 4.2 -- -- --  CL 94* 92* 86* 93* 94*  CO2 30 28 30 31 29   GLUCOSE 99 80 107* 102* 124*  BUN 35* 27* 29* 29* 26*  CREATININE 1.04 1.12* 1.06 1.26* 1.15*  CALCIUM 9.0 8.6 8.9 8.6 8.5  MG -- -- -- -- --  PHOS -- -- -- -- --   Liver Function Tests:  Lab 09/19/11 1640  AST 17  ALT 12  ALKPHOS 86  BILITOT 0.3  PROT 6.4  ALBUMIN 2.9*   CBC:  Lab 09/21/11 0700 09/19/11 1640 09/16/11 0852 09/15/11 0352  WBC 6.3 6.7 3.6* 3.8*  NEUTROABS -- -- -- --  HGB 8.2* 9.2* 8.6* 9.1*  HCT 26.0* 28.7* 27.5* 29.1*  MCV 82.8 82.0 83.1 83.1  PLT SPECIMEN CHECKED FOR CLOTS 141* 108* 111*   Cardiac Enzymes:  Lab 09/21/11 0700 09/21/11 0015 09/20/11 1804  CKTOTAL 45 42 43  CKMB 2.0 1.9 1.8  CKMBINDEX -- -- --  TROPONINI <0.30 <0.30 <0.30   CBG:  Lab 09/21/11 1134 09/21/11 0741 09/20/11 2127 09/20/11 1706 09/20/11 1230  GLUCAP 109* 97 130* 139* 140*    Recent Results (from the past 240 hour(s))  CULTURE, BLOOD (ROUTINE X 2)  Status: Normal   Collection Time   09/12/11  6:10 PM      Component Value Range Status Comment   Specimen Description BLOOD RIGHT ARM   Final    Special Requests BOTTLES DRAWN AEROBIC ONLY 3CC   Final    Setup Time [id number]   Final    Culture NO GROWTH 5 DAYS   Final    Report Status 09/19/2011 FINAL   Final   CULTURE, BLOOD (ROUTINE X 2)     Status: Normal   Collection Time   09/12/11  6:15 PM      Component Value Range Status Comment   Specimen Description BLOOD RIGHT HAND   Final    Special Requests BOTTLES DRAWN AEROBIC ONLY 3CC   Final    Setup Time [id number]   Final    Culture NO GROWTH 5  DAYS   Final    Report Status 09/19/2011 FINAL   Final   URINE CULTURE     Status: Normal   Collection Time   09/12/11  7:15 PM      Component Value Range Status Comment   Specimen Description URINE, CATHETERIZED   Final    Special Requests NONE   Final    Setup Time [id number]   Final    Colony Count >=100,000 COLONIES/ML   Final    Culture ESCHERICHIA COLI   Final    Report Status 09/14/2011 FINAL   Final    Organism ID, Bacteria ESCHERICHIA COLI   Final      Studies: Dg Chest Port 1 View  09/19/2011  *RADIOLOGY REPORT*  Clinical Data: Weakness and confusion, history of bypass  PORTABLE CHEST - 1 VIEW  Comparison: 09/12/2011; 06/08/2011; 10/27/2004  Findings: Unchanged enlarged cardiac silhouette and mediastinal contours post median sternotomy and CABG.  Pulmonary vasculature remains indistinct with cephalization of flow.  Grossly unchanged bibasilar heterogeneous opacities.  No new focal airspace opacities.  No pleural effusion or pneumothorax.  Unchanged bones.  IMPRESSION: Unchanged findings of mild pulmonary edema.  Original Report Authenticated By: Waynard Reeds, M.D.    Scheduled Meds:   . allopurinol  100 mg Oral Daily  . clopidogrel  75 mg Oral Daily  . enoxaparin  40 mg Subcutaneous Q24H  . ezetimibe-simvastatin  1 tablet Oral QHS  . furosemide  40 mg Intravenous BID  . insulin aspart  0-15 Units Subcutaneous TID WC  . insulin aspart  0-5 Units Subcutaneous QHS  . isosorbide mononitrate  30 mg Oral Daily  . lisinopril  2.5 mg Oral Daily  . metoprolol tartrate  12.5 mg Oral BID  . pantoprazole  40 mg Oral Daily  . potassium chloride  20 mEq Oral BID  . pregabalin  75 mg Oral Daily  . primidone  50 mg Oral Daily  . senna  1 tablet Oral BID  . sodium chloride  3 mL Intravenous Q12H   Continuous Infusions:    Assessment/Plan: 1. Exacerbation of systolic congestive heart failure: Respiratory status appears to be stable. Unfortunately there is no way from today or  yesterday. Negative I/O today. Continue lisinopril and metoprolol. Change Lasix to oral. 2. Hyponatremia: Felt to be secondary to hyperaldosteronism. Improved today. 3. Dementia/acute encephalopathy: Superimposed encephalopathy versus progression of dementia. Long-standing dementia per son. At baseline the patient spends most of the time either sleeping or watching TV. She does not walk. She is either bed bound or wheelchair bound. No focal neurologic deficits but stroke would be in the differential  here. I think this is less likely. Followup MRI of brain. Also urine culture. Obtain urinalysis 4. Diabetes mellitus type 2: Stable. Family wishes to avoid sliding scale insulin. 5. Hypertension: Stable.  Code Status: Full Family Communication: Spoke with son, daughter, husband at bedside. Disposition Plan: Home with home health services and aide for 7 hours a day.   Brendia Sacks, MD  [facility] Pager [phone] 09/21/2011, 4:15 PM    LOS: 2 days

## 2011-09-21 NOTE — Progress Notes (Signed)
Spoke with dtr in room, she is not primary caregiver, is here from [place] to assist family. She called the dtr that is primary caregiver and we spoke on the phone. Patient is active with [facility] for [facility] services, she has an aide that is with her 7hr/day. A nurse sees the patient weekly to several times per week. Dtr wants to see if she will qualify for home O2, states she was on it and it was d/ced. No other DME or HH services needed. Will continue to follow.

## 2011-09-22 ENCOUNTER — Inpatient Hospital Stay (HOSPITAL_COMMUNITY): Payer: Medicare Other

## 2011-09-22 LAB — GLUCOSE, CAPILLARY
Glucose-Capillary: 93 mg/dL (ref 70–99)
Glucose-Capillary: 115 mg/dL — ABNORMAL HIGH (ref 70–99)
Glucose-Capillary: 115 mg/dL — ABNORMAL HIGH (ref 70–99)
Glucose-Capillary: 123 mg/dL — ABNORMAL HIGH (ref 70–99)

## 2011-09-22 LAB — BASIC METABOLIC PANEL
CO2: 31 mEq/L (ref 19–32)
Calcium: 9.3 mg/dL (ref 8.4–10.5)
Creatinine, Ser: 0.93 mg/dL (ref 0.50–1.10)
GFR calc non Af Amer: 57 mL/min — ABNORMAL LOW (ref >90)
Glucose, Bld: 86 mg/dL (ref 70–99)

## 2011-09-22 MED ORDER — GUAIFENESIN ER 600 MG PO TB12
600.0000 mg | ORAL_TABLET | Freq: Two times a day (BID) | ORAL | Status: DC
  Administered 2011-09-22 – 2011-09-30 (×17): 600 mg via ORAL
  Filled 2011-09-22 (×20): qty 1

## 2011-09-22 NOTE — Progress Notes (Signed)
[facility] Progress Note  Subjective:  Feeling better; alert  Objective:   Vital Signs in the last 24 hours: Temp:  [98.5 F (36.9 C)-98.8 F (37.1 C)] 98.5 F (36.9 C) (12/29 0600) Pulse Rate:  [72] 72  (12/29 0600) Resp:  [18-20] 20  (12/29 0600) BP: (111-124)/(64-71) 124/64 mmHg (12/29 0600) SpO2:  [95 %-99 %] 95 % (12/29 0600)  Intake/Output from previous day: 12/28 0701 - 12/29 0700 In: 240 [P.O.:240] Out: 2100 [Urine:2100]  Scheduled:   . allopurinol  100 mg Oral Daily  . clopidogrel  75 mg Oral Daily  . enoxaparin  40 mg Subcutaneous Q24H  . ezetimibe-simvastatin  1 tablet Oral QHS  . furosemide  40 mg Oral BID  . insulin aspart  0-15 Units Subcutaneous TID WC  . insulin aspart  0-5 Units Subcutaneous QHS  . isosorbide mononitrate  30 mg Oral Daily  . lisinopril  2.5 mg Oral Daily  . metoprolol tartrate  12.5 mg Oral BID  . pantoprazole  40 mg Oral Daily  . potassium chloride  20 mEq Oral BID  . pregabalin  75 mg Oral Daily  . primidone  50 mg Oral Daily  . senna  1 tablet Oral BID  . sodium chloride  3 mL Intravenous Q12H  . DISCONTD: furosemide  40 mg Intravenous BID    Physical Exam:   General appearance: alert, cooperative and no distress Neck: no adenopathy, no carotid bruit, no JVD and supple, symmetrical, trachea midline Lungs: rhonchi scattered and diffuse Heart: RRR 1/6 sem Abdomen: soft, BS + Extremities: trivial edema Skin: no rash   Rate: 80  Rhythm: sinus  Lab Results:   Basename 09/22/11 0525 09/21/11 0700  NA 134* 130*  K 4.5 4.6  CL 95* 94*  CO2 31 30  GLUCOSE 86 99  BUN 30* 35*  CREATININE 0.93 1.04    Basename 09/21/11 0700 09/21/11 0015  TROPONINI <0.30 <0.30   Hepatic Function Panel  Basename 09/19/11 1640  PROT 6.4  ALBUMIN 2.9*  AST 17  ALT 12  ALKPHOS 86  BILITOT 0.3  BILIDIR --  IBILI --    Basename 09/19/11 1640  INR 0.92        Mr Brain Wo  Contrast  09/21/2011  *RADIOLOGY REPORT*  Clinical Data: Acute encephalopathy.  Increased confusion and lethargy.  Weakness.  Weight loss.  MRI HEAD WITHOUT CONTRAST  Technique:  Multiplanar, multiecho pulse sequences of the brain and surrounding structures were obtained according to standard protocol without intravenous contrast.  Comparison: MRI brain 06/10/2011.  Findings: The diffusion weighted images demonstrate no evidence for acute or subacute infarction.  There are remote infarct of the left basal ganglia is again noted.  There is ex vacuo dilation of the left lateral ventricle.  Moderate generalized atrophy is present. Extensive confluent periventricular white matter changes are seen bilaterally.  There is chronic layering year degeneration of the left cerebral peduncle.  Flow is present within the major intracranial arteries.  The patient is status post bilateral lens extractions.  The globes and orbits are otherwise intact.  The paranasal sinuses are clear.  There is some fluid in the mastoid air cells bilaterally.  No obstructing nasopharyngeal lesion is evident.  IMPRESSION:  1.  No acute intracranial abnormality. 2.  Stable remote infarct of the left basal ganglia. 3.  Generalized atrophy and white matter disease is stable.  Original Report Authenticated By: Jamesetta Orleans. MATTERN, M.D.  2D Echocardiogram  EF 45%    Assessment/Plan:   Active Problems:  Hyponatremia  CHF exacerbation CAD Remote TIA  Clinically improving.  Sodium up from 122 to 134. Now on oral lasix.  Scattered rhonchi on lung exam, will check CXray. C/O congestion.  Will add mucinex. No signs of CHF presently.   Lennette Bihari, MD, [facility] 09/22/2011, 2:44 PM

## 2011-09-22 NOTE — Progress Notes (Signed)
PROGRESS NOTE  [name] RUE:[id number] DOB: 1931/06/29 DOA: 09/19/2011 PCP: Florentina Jenny, MD  Brief narrative: 77 year old woman recently discharged December 23 presented on December 26 with anorexia, nausea, fatigue and increased edema of the lower extremities and failure to thrive. Found to have urinary retention. Water consumption by report 2-3 L per day. Admitted for hyponatremia, acute systolic heart failure. Per son the patient has history of long-standing dementia. She is bedbound and spends most of her days in a wheel chair watching TV or sleeping. She is usually quite interactive and conversant but memory is very poor. The last 10 days she is mostly just stared rather than interacting.  Discharged December 23: UTI, altered metal status  Past medical history: Diabetes mellitus, stroke, hypertension, CABG, systolic congestive heart failure with a left ventricular ejection fraction 40-45%, grade 1 diastolic dysfunction, advanced dementia.  Consultants:  Cardiology  Procedures:  None  Antibiotics:  None  Interim History: Interval documentation reviewed. MRI brain negative. Subjective: Improved per family. Family has no concerns today.  Objective: Filed Vitals:   09/21/11 0800 09/21/11 1300 09/21/11 2200 09/22/11 0600  BP: 124/46 124/44 111/71 124/64  Pulse:  69 72 72  Temp:  98.9 F (37.2 C) 98.8 F (37.1 C) 98.5 F (36.9 C)  TempSrc:  Oral Oral Oral  Resp:  18 18 20   Height:      Weight:      SpO2:  97% 99% 95%    Intake/Output Summary (Last 24 hours) at 09/22/11 1528 Last data filed at 09/22/11 1348  Gross per 24 hour  Intake     80 ml  Output    900 ml  Net   -820 ml    Exam:  General: Appears calm and comfortable.  Cardiovascular: Regular rate and rhythm. No murmur, rub or gallop. No significant lower extremity edema.  Respiratory: Clear to auscultation bilaterally. No wheezes, rales or rhonchi. Respiratory effort. Abdomen: Soft, nontender  nondistended. Skin: Appears grossly unremarkable.  Data Reviewed: Basic Metabolic Panel:  Lab 09/22/11 1478 09/21/11 0700 09/20/11 0455 09/19/11 1640 09/16/11 0852  NA 134* 130* 128* 122* 131*  K 4.5 4.6 -- -- --  CL 95* 94* 92* 86* 93*  CO2 31 30 28 30 31   GLUCOSE 86 99 80 107* 102*  BUN 30* 35* 27* 29* 29*  CREATININE 0.93 1.04 1.12* 1.06 1.26*  CALCIUM 9.3 9.0 8.6 8.9 8.6  MG -- -- -- -- --  PHOS -- -- -- -- --   Liver Function Tests:  Lab 09/19/11 1640  AST 17  ALT 12  ALKPHOS 86  BILITOT 0.3  PROT 6.4  ALBUMIN 2.9*   CBC:  Lab 09/21/11 0700 09/19/11 1640 09/16/11 0852  WBC 6.3 6.7 3.6*  NEUTROABS -- -- --  HGB 8.2* 9.2* 8.6*  HCT 26.0* 28.7* 27.5*  MCV 82.8 82.0 83.1  PLT SPECIMEN CHECKED FOR CLOTS 141* 108*   Cardiac Enzymes:  Lab 09/21/11 0700 09/21/11 0015 09/20/11 1804  CKTOTAL 45 42 43  CKMB 2.0 1.9 1.8  CKMBINDEX -- -- --  TROPONINI <0.30 <0.30 <0.30   CBG:  Lab 09/22/11 1144 09/22/11 0746 09/21/11 2159 09/21/11 1738 09/21/11 1134  GLUCAP 115* 93 118* 124* 109*    Recent Results (from the past 240 hour(s))  CULTURE, BLOOD (ROUTINE X 2)     Status: Normal   Collection Time   09/12/11  6:10 PM      Component Value Range Status Comment   Specimen Description BLOOD RIGHT ARM  Final    Special Requests BOTTLES DRAWN AEROBIC ONLY 3CC   Final    Setup Time 201212200214   Final    Culture NO GROWTH 5 DAYS   Final    Report Status 09/19/2011 FINAL   Final   CULTURE, BLOOD (ROUTINE X 2)     Status: Normal   Collection Time   09/12/11  6:15 PM      Component Value Range Status Comment   Specimen Description BLOOD RIGHT HAND   Final    Special Requests BOTTLES DRAWN AEROBIC ONLY 3CC   Final    Setup Time 201212200214   Final    Culture NO GROWTH 5 DAYS   Final    Report Status 09/19/2011 FINAL   Final   URINE CULTURE     Status: Normal   Collection Time   09/12/11  7:15 PM      Component Value Range Status Comment   Specimen Description  URINE, CATHETERIZED   Final    Special Requests NONE   Final    Setup Time [id number]   Final    Colony Count >=100,000 COLONIES/ML   Final    Culture ESCHERICHIA COLI   Final    Report Status 09/14/2011 FINAL   Final    Organism ID, Bacteria ESCHERICHIA COLI   Final     Scheduled Meds:    . allopurinol  100 mg Oral Daily  . clopidogrel  75 mg Oral Daily  . enoxaparin  40 mg Subcutaneous Q24H  . ezetimibe-simvastatin  1 tablet Oral QHS  . furosemide  40 mg Oral BID  . guaiFENesin  600 mg Oral BID  . insulin aspart  0-15 Units Subcutaneous TID WC  . insulin aspart  0-5 Units Subcutaneous QHS  . isosorbide mononitrate  30 mg Oral Daily  . lisinopril  2.5 mg Oral Daily  . metoprolol tartrate  12.5 mg Oral BID  . pantoprazole  40 mg Oral Daily  . potassium chloride  20 mEq Oral BID  . pregabalin  75 mg Oral Daily  . primidone  50 mg Oral Daily  . senna  1 tablet Oral BID  . sodium chloride  3 mL Intravenous Q12H  . DISCONTD: furosemide  40 mg Intravenous BID   Continuous Infusions:    Assessment/Plan: 1. Exacerbation of systolic congestive heart failure: Respiratory status appears to be stable.  Continue lisinopril and metoprolol. Continue oral Lasix. 2. Hyponatremia: Felt to be secondary to hyperaldosteronism. Nearly resolved. 3. Dementia/acute encephalopathy: Improved. Superimposed encephalopathy versus progression of dementia. Long-standing dementia per son. At baseline the patient spends most of the time either sleeping or watching TV. She does not walk. She is either bed bound or wheelchair bound. No focal neurologic deficits. MRI brain negative. Repeat urinalysis. Appears to be near baseline.  4. Diabetes mellitus type 2: Stable. Family wishes to avoid sliding scale insulin. 5. Hypertension: Stable.  Code Status: Full Family Communication: Spoke with daughter, husband at bedside. Disposition Plan: Home with home health services and aide for 7 hours a day.   Brendia Sacks, MD  [facility] Pager [phone] 09/22/2011, 3:28 PM    LOS: 3 days

## 2011-09-23 LAB — BASIC METABOLIC PANEL
Chloride: 95 mEq/L — ABNORMAL LOW (ref 96–112)
GFR calc Af Amer: 68 mL/min — ABNORMAL LOW (ref >90)
GFR calc non Af Amer: 59 mL/min — ABNORMAL LOW (ref >90)
Potassium: 5.1 mEq/L (ref 3.5–5.1)
Sodium: 132 mEq/L — ABNORMAL LOW (ref 135–145)

## 2011-09-23 MED ORDER — ENSURE PUDDING PO PUDG
1.0000 | Freq: Two times a day (BID) | ORAL | Status: DC
  Administered 2011-09-24 – 2011-09-30 (×12): 1 via ORAL
  Filled 2011-09-23 (×16): qty 1

## 2011-09-23 NOTE — Progress Notes (Addendum)
PROGRESS NOTE  [name] WUJ:[id number] DOB: 1931/05/08 DOA: 09/19/2011 PCP: Florentina Jenny, MD  Brief narrative: 77 year old woman recently discharged December 23 presented on December 26 with anorexia, nausea, fatigue and increased edema of the lower extremities and failure to thrive. Found to have urinary retention. Water consumption by report 2-3 L per day. Admitted for hyponatremia, acute systolic heart failure. Per son the patient has history of long-standing dementia. She is bedbound and spends most of her days in a wheel chair watching TV or sleeping. She is usually quite interactive and conversant but memory is very poor. The last 10 days she is mostly just stared rather than interacting.  Discharged December 23: UTI, altered metal status  Past medical history: Diabetes mellitus, stroke, hypertension, CABG, systolic congestive heart failure with a left ventricular ejection fraction 40-45%, grade 1 diastolic dysfunction, advanced dementia.  Consultants:  Cardiology  Procedures:  None  Antibiotics:  None  Interim History: No interval documentation. Subjective: Improved per family.   Objective: Filed Vitals:   09/22/11 2140 09/23/11 0642 09/23/11 1045 09/23/11 1204  BP: 120/68 111/63 138/80 131/70  Pulse: 76 85 84 82  Temp: 97.8 F (36.6 C) 98.3 F (36.8 C)  98.2 F (36.8 C)  TempSrc: Oral Oral  Oral  Resp: 18 20  20   Height:      Weight:  94.1 kg (207 lb 7.3 oz)    SpO2: 97% 94%  98%    Intake/Output Summary (Last 24 hours) at 09/23/11 1745 Last data filed at 09/23/11 1510  Gross per 24 hour  Intake    120 ml  Output   3025 ml  Net  -2905 ml    Exam:  General: Appears calm and comfortable. Appears alert. Cardiovascular: Regular rate and rhythm. No murmur, rub or gallop. No significant lower extremity edema.  Respiratory: Clear to auscultation bilaterally. No wheezes, rales or rhonchi. Normal respiratory effort.  Data Reviewed: Basic Metabolic  Panel:  Lab 09/23/11 0630 09/22/11 0525 09/21/11 0700 09/20/11 0455 09/19/11 1640  NA 132* 134* 130* 128* 122*  K 5.1 4.5 -- -- --  CL 95* 95* 94* 92* 86*  CO2 25 31 30 28 30   GLUCOSE 96 86 99 80 107*  BUN 27* 30* 35* 27* 29*  CREATININE 0.91 0.93 1.04 1.12* 1.06  CALCIUM 9.4 9.3 9.0 8.6 8.9  MG -- -- -- -- --  PHOS -- -- -- -- --   Liver Function Tests:  Lab 09/19/11 1640  AST 17  ALT 12  ALKPHOS 86  BILITOT 0.3  PROT 6.4  ALBUMIN 2.9*   CBC:  Lab 09/21/11 0700 09/19/11 1640  WBC 6.3 6.7  NEUTROABS -- --  HGB 8.2* 9.2*  HCT 26.0* 28.7*  MCV 82.8 82.0  PLT SPECIMEN CHECKED FOR CLOTS 141*   Cardiac Enzymes:  Lab 09/21/11 0700 09/21/11 0015 09/20/11 1804  CKTOTAL 45 42 43  CKMB 2.0 1.9 1.8  CKMBINDEX -- -- --  TROPONINI <0.30 <0.30 <0.30   CBG:  Lab 09/23/11 1639 09/23/11 1114 09/23/11 0727 09/22/11 2139 09/22/11 1642  GLUCAP 113* 134* 109* 123* 115*   Scheduled Meds:    . allopurinol  100 mg Oral Daily  . clopidogrel  75 mg Oral Daily  . enoxaparin  40 mg Subcutaneous Q24H  . ezetimibe-simvastatin  1 tablet Oral QHS  . furosemide  40 mg Oral BID  . guaiFENesin  600 mg Oral BID  . insulin aspart  0-15 Units Subcutaneous TID WC  . insulin aspart  0-5 Units Subcutaneous QHS  . isosorbide mononitrate  30 mg Oral Daily  . lisinopril  2.5 mg Oral Daily  . metoprolol tartrate  12.5 mg Oral BID  . pantoprazole  40 mg Oral Daily  . pregabalin  75 mg Oral Daily  . primidone  50 mg Oral Daily  . senna  1 tablet Oral BID  . sodium chloride  3 mL Intravenous Q12H  . DISCONTD: potassium chloride  20 mEq Oral BID   Continuous Infusions:    Assessment/Plan: 1. Exacerbation of systolic congestive heart failure: Resolved. Respiratory status appears to be stable.  Continue lisinopril and metoprolol. Continue oral Lasix. 2. Hyponatremia: Felt to be secondary to hyperaldosteronism. Stable. 3. Dementia/acute encephalopathy: Resolved. Superimposed encephalopathy  versus progression of dementia. Long-standing dementia per son. At baseline the patient spends most of the time either sleeping or watching TV. She does not walk. She is either bed bound or wheelchair bound. No focal neurologic deficits. MRI brain negative. Repeat urinalysis. Appears to be near baseline.  4. Diabetes mellitus type 2: Stable. Family wishes to avoid sliding scale insulin. 5. Hypertension: Stable. 6. Moderate malnutrition  Discontinue Foley catheter. Wean oxygen.  Code Status: Full Family Communication: Spoke with family at bedside. Disposition Plan: Home with home health services and aide for 7 hours a day. Likely discharge December 31.   Brendia Sacks, MD  [facility] Pager [phone] 09/23/2011, 5:45 PM    LOS: 4 days

## 2011-09-24 ENCOUNTER — Inpatient Hospital Stay (HOSPITAL_COMMUNITY): Payer: Medicare Other

## 2011-09-24 LAB — BASIC METABOLIC PANEL
BUN: 27 mg/dL — ABNORMAL HIGH (ref 6–23)
Calcium: 9.5 mg/dL (ref 8.4–10.5)
Creatinine, Ser: 0.93 mg/dL (ref 0.50–1.10)
GFR calc non Af Amer: 57 mL/min — ABNORMAL LOW (ref >90)
Glucose, Bld: 99 mg/dL (ref 70–99)

## 2011-09-24 LAB — URINALYSIS, ROUTINE W REFLEX MICROSCOPIC
Bilirubin Urine: NEGATIVE
Ketones, ur: NEGATIVE mg/dL
Protein, ur: NEGATIVE mg/dL
Specific Gravity, Urine: 1.01 (ref 1.005–1.030)
Urobilinogen, UA: 0.2 mg/dL (ref 0.0–1.0)

## 2011-09-24 LAB — GLUCOSE, CAPILLARY
Glucose-Capillary: 101 mg/dL — ABNORMAL HIGH (ref 70–99)
Glucose-Capillary: 136 mg/dL — ABNORMAL HIGH (ref 70–99)
Glucose-Capillary: 121 mg/dL — ABNORMAL HIGH (ref 70–99)

## 2011-09-24 LAB — URINE MICROSCOPIC-ADD ON

## 2011-09-24 MED ORDER — VANCOMYCIN HCL 1000 MG IV SOLR
750.0000 mg | Freq: Two times a day (BID) | INTRAVENOUS | Status: DC
  Administered 2011-09-25 – 2011-09-26 (×5): 750 mg via INTRAVENOUS
  Filled 2011-09-24 (×6): qty 750

## 2011-09-24 MED ORDER — LEVOFLOXACIN IN D5W 750 MG/150ML IV SOLN
750.0000 mg | INTRAVENOUS | Status: DC
  Administered 2011-09-24 – 2011-09-26 (×3): 750 mg via INTRAVENOUS
  Filled 2011-09-24 (×4): qty 150

## 2011-09-24 NOTE — Progress Notes (Signed)
Per MD request and qualifying sats, patient will need home O2 at d/c. Contacted [facility], they do not provide DME for patient. Contacted [facility] and they will arrange O2 for home use at d/c.

## 2011-09-24 NOTE — Progress Notes (Signed)
ANTIBIOTIC CONSULT NOTE - INITIAL  Pharmacy Consult for Vancomycin Indication: rule out pneumonia  Allergies  Allergen Reactions  . Penicillins Itching and Swelling    Patient Measurements: Height: 5\' 3"  (160 cm) Weight: 201 lb 1 oz (91.2 kg) IBW/kg (Calculated) : 52.4  Adjusted Body Weight: 68 kg  Vital Signs: Temp: 98.1 F (36.7 C) (12/31 1450) Temp src: Oral (12/31 1450) BP: 126/75 mmHg (12/31 1450) Pulse Rate: 86  (12/31 1450) Intake/Output from previous day: 12/30 0701 - 12/31 0700 In: 120 [P.O.:120] Out: 1250 [Urine:1250]  Labs:  Basename 09/24/11 0455 09/23/11 0630 09/22/11 0525  WBC -- -- --  HGB -- -- --  PLT -- -- --  LABCREA -- -- --  CREATININE 0.93 0.91 0.93   Estimated Creatinine Clearance: 53.4 ml/min (by C-G formula based on Cr of 0.93).   Microbiology: 12/19 blood culture: no growth 12/19 blood culture: no growth 12/19 urine culture > 100,000 E.coli    Medical History: Past Medical History  Diagnosis Date  . Diabetes mellitus   . Hypertension   . Stroke Right Side Weakness    Medications:  Anti-infectives     Start     Dose/Rate Route Frequency Ordered Stop   09/24/11 2000   Levofloxacin (LEVAQUIN) IVPB 750 mg        750 mg 100 mL/hr over 90 Minutes Intravenous Every 24 hours 09/24/11 1802           Assessment: 77 yo F admit with UTI r/o pneumonia Recently admitted from 12/19 - 12/23 for UTI Levaquin already ordered for UTI and PNA coverage Urine culture pending  Goal of Therapy:  Vancomycin trough level 15-20 mcg/ml  Plan:  Vancomycin 750mg  IV Q12 hours Measure antibiotic drug levels at steady state Follow up culture results, renal function, and labs as available  Lynann Beaver PharmD  Pager [phone] 09/24/2011 9:04 PM

## 2011-09-24 NOTE — Progress Notes (Signed)
PROGRESS NOTE  [name] JXB:[id number] DOB: 19-Sep-1931 DOA: 09/19/2011 PCP: Florentina Jenny, MD  Brief narrative: 77 year old woman recently discharged December 23 presented on December 26 with anorexia, nausea, fatigue and increased edema of the lower extremities and failure to thrive. Found to have urinary retention. Water consumption by report 2-3 L per day. Admitted for hyponatremia, acute systolic heart failure. Per son the patient has history of long-standing dementia. She is bedbound and spends most of her days in a wheel chair watching TV or sleeping. She is usually quite interactive and conversant but memory is very poor. The last 10 days she is mostly just stared rather than interacting.  Discharged December 23: UTI, altered metal status  Past medical history: Diabetes mellitus, stroke, hypertension, CABG, systolic congestive heart failure with a left ventricular ejection fraction 40-45%, grade 1 diastolic dysfunction, advanced dementia.  Consultants:  Cardiology  Procedures:  None  Antibiotics:  None  Interim History: Noted by nurses to be hypoxic on room air. More so this morning. Subjective: Family reports that she is sleepier today.   Objective: Filed Vitals:   09/23/11 2232 09/24/11 0624 09/24/11 1228 09/24/11 1450  BP:  113/58  126/75  Pulse:  80  86  Temp:  98.8 F (37.1 C)  98.1 F (36.7 C)  TempSrc:  Axillary  Oral  Resp:  16  21  Height:      Weight:   91.2 kg (201 lb 1 oz)   SpO2: 94% 98%     No intake or output data in the 24 hours ending 09/24/11 2013  Exam:  General: Sleeping. Appears comfortable.  Cardiovascular: Regular rate and rhythm. No murmur, rub or gallop. No significant lower extremity edema.  Respiratory: Clear to auscultation bilaterally. No wheezes, rales or rhonchi. Normal respiratory effort.  Data Reviewed: Basic Metabolic Panel:  Lab 09/24/11 1308 09/23/11 0630 09/22/11 0525 09/21/11 0700 09/20/11 0455  NA 133* 132*  134* 130* 128*  K 3.9 5.1 -- -- --  CL 94* 95* 95* 94* 92*  CO2 31 25 31 30 28   GLUCOSE 99 96 86 99 80  BUN 27* 27* 30* 35* 27*  CREATININE 0.93 0.91 0.93 1.04 1.12*  CALCIUM 9.5 9.4 9.3 9.0 8.6  MG -- -- -- -- --  PHOS -- -- -- -- --   Liver Function Tests:  Lab 09/19/11 1640  AST 17  ALT 12  ALKPHOS 86  BILITOT 0.3  PROT 6.4  ALBUMIN 2.9*   CBC:  Lab 09/21/11 0700 09/19/11 1640  WBC 6.3 6.7  NEUTROABS -- --  HGB 8.2* 9.2*  HCT 26.0* 28.7*  MCV 82.8 82.0  PLT SPECIMEN CHECKED FOR CLOTS 141*   Cardiac Enzymes:  Lab 09/21/11 0700 09/21/11 0015 09/20/11 1804  CKTOTAL 45 42 43  CKMB 2.0 1.9 1.8  CKMBINDEX -- -- --  TROPONINI <0.30 <0.30 <0.30   CBG:  Lab 09/24/11 1706 09/24/11 1218 09/24/11 0753 09/23/11 2204 09/23/11 1639  GLUCAP 136* 114* 101* 114* 113*   Scheduled Meds:    . allopurinol  100 mg Oral Daily  . clopidogrel  75 mg Oral Daily  . enoxaparin  40 mg Subcutaneous Q24H  . ezetimibe-simvastatin  1 tablet Oral QHS  . feeding supplement  1 Container Oral BID BM  . furosemide  40 mg Oral BID  . guaiFENesin  600 mg Oral BID  . insulin aspart  0-15 Units Subcutaneous TID WC  . insulin aspart  0-5 Units Subcutaneous QHS  . isosorbide mononitrate  30 mg Oral Daily  . levofloxacin (LEVAQUIN) IV  750 mg Intravenous Q24H  . lisinopril  2.5 mg Oral Daily  . metoprolol tartrate  12.5 mg Oral BID  . pantoprazole  40 mg Oral Daily  . pregabalin  75 mg Oral Daily  . primidone  50 mg Oral Daily  . senna  1 tablet Oral BID  . sodium chloride  3 mL Intravenous Q12H   Continuous Infusions:    Assessment/Plan: 1. Urinary tract infection: Empiric Levaquin. Followup culture. 2. Hypoxia/atelectasis: I reviewed today's chest x-rays in comparison with previous films. Left base atelectasis/infiltrate appears to be unchanged compared to previous chest x-rays dated December 29 and December 19. It certainly is not clear that her x-ray findings represent pneumonia. The  patient has in the past has been oxygen dependent although apparently had improved. Levaquin will provide good coverage. Cannot rule out resistance organisms. Add vancomycin.  3. Exacerbation of systolic congestive heart failure: Resolved.  Continue lisinopril and metoprolol. Continue oral Lasix. 4. Hyponatremia: Felt to be secondary to hyperaldosteronism. Stable. 5. Dementia/acute encephalopathy: Resolved. Superimposed encephalopathy versus progression of dementia. Long-standing dementia per son. At baseline the patient spends most of the time either sleeping or watching TV. She does not walk. She is either bed bound or wheelchair bound. No focal neurologic deficits. MRI brain negative. Repeat urinalysis. Appears to be near baseline.  6. Diabetes mellitus type 2: Stable. Family wishes to avoid sliding scale insulin. 7. Hypertension: Stable. 8. Moderate malnutrition    Code Status: Full Family Communication: Spoke with family at bedside. Disposition Plan: Home with home health services and aide for 7 hours a day when stable.   Brendia Sacks, MD  [facility] Pager [phone] 09/24/2011, 8:13 PM    LOS: 5 days

## 2011-09-24 NOTE — Progress Notes (Addendum)
Pt was 83% on Room air after 10 minutes of evaluation, 86% on 2L after evaluation for 10 mins, 90% on room air 3L after 10 mins of evaluation.   Pt is very congested, with audible wheezes. Pt has difficulty breathing on exertion without O2, and is dizzy (per family).  All oxygen was given with nasal cannula tubing.   [name] 09/24/2011 11:24 AM

## 2011-09-24 NOTE — Progress Notes (Signed)
Pt's O2 sats decreased to 86-87% on RA. 2L applied. O2 sats increased to 93-94%. Will continue to monitor and try to wean O2 again in AM.

## 2011-09-25 LAB — BASIC METABOLIC PANEL
BUN: 33 mg/dL — ABNORMAL HIGH (ref 6–23)
Chloride: 93 mEq/L — ABNORMAL LOW (ref 96–112)
GFR calc Af Amer: 59 mL/min — ABNORMAL LOW (ref >90)
Potassium: 4 mEq/L (ref 3.5–5.1)

## 2011-09-25 LAB — GLUCOSE, CAPILLARY
Glucose-Capillary: 95 mg/dL (ref 70–99)
Glucose-Capillary: 169 mg/dL — ABNORMAL HIGH (ref 70–99)
Glucose-Capillary: 98 mg/dL (ref 70–99)

## 2011-09-25 LAB — CBC
Platelets: 174 10*3/uL (ref 150–400)
RBC: 3.39 MIL/uL — ABNORMAL LOW (ref 3.87–5.11)
WBC: 7.8 10*3/uL (ref 4.0–10.5)

## 2011-09-25 MED ORDER — SENNOSIDES-DOCUSATE SODIUM 8.6-50 MG PO TABS
1.0000 | ORAL_TABLET | Freq: Two times a day (BID) | ORAL | Status: DC
  Administered 2011-09-25 – 2011-09-30 (×10): 1 via ORAL
  Filled 2011-09-25 (×15): qty 1

## 2011-09-25 MED ORDER — POLYETHYLENE GLYCOL 3350 17 G PO PACK
17.0000 g | PACK | Freq: Every day | ORAL | Status: DC
  Administered 2011-09-25 – 2011-09-30 (×4): 17 g via ORAL
  Filled 2011-09-25 (×7): qty 1

## 2011-09-25 MED ORDER — BISACODYL 10 MG RE SUPP
10.0000 mg | Freq: Every day | RECTAL | Status: DC | PRN
  Administered 2011-09-25: 10 mg via RECTAL
  Filled 2011-09-25: qty 1

## 2011-09-25 NOTE — Progress Notes (Signed)
PROGRESS NOTE  TELETHA PETREA WUJ:[id number] DOB: 03/24/1931 DOA: 09/19/2011 PCP: Florentina Jenny, MD  Brief narrative: 78 year old woman recently discharged December 23 presented on December 26 with anorexia, nausea, fatigue and increased edema of the lower extremities and failure to thrive. Found to have urinary retention. Water consumption by report 2-3 L per day. Admitted for hyponatremia, acute systolic heart failure. Per son the patient has history of long-standing dementia. She is bedbound and spends most of her days in a wheel chair watching TV or sleeping. She is usually quite interactive and conversant but memory is very poor. The last 10 days she is mostly just stared rather than interacting.  Overall she is somewhat improved and heart failure has resolved. However she was found to have a urinary tract infection is currently on antibiotics for that. Her chest x-ray was somewhat suggestive of pneumonia in the context of new hypoxia. Once medically stable plan is for discharge back home with her family. They are not interested in the skilled facility.  Past medical history: Diabetes mellitus, stroke, hypertension, CABG, systolic congestive heart failure with a left ventricular ejection fraction 40-45%, grade 1 diastolic dysfunction, advanced dementia.  Consultants:  Cardiology  Procedures:  None  Antibiotics:  December 31: Vancomycin/Levaquin  Interim History: Interval documentation reviewed. Subjective: Family reports main issue at this point is that the patient is constipated.  Objective: Filed Vitals:   09/24/11 1450 09/24/11 2140 09/25/11 0621 09/25/11 1338  BP: 126/75 90/57 100/67 96/58  Pulse: 86 93 79 91  Temp: 98.1 F (36.7 C) 97.3 F (36.3 C) 98 F (36.7 C) 98.1 F (36.7 C)  TempSrc: Oral Oral Oral Oral  Resp: 21 22 18 18   Height:      Weight:   91.7 kg (202 lb 2.6 oz)   SpO2:  96% 93% 94%    Intake/Output Summary (Last 24 hours) at 09/25/11 1546 Last  data filed at 09/25/11 1300  Gross per 24 hour  Intake    450 ml  Output      0 ml  Net    450 ml    Exam:  General: Awake and alert. Not very interactive. Cardiovascular: Regular rate and rhythm. No murmur, rub or gallop. No significant lower extremity edema.  Respiratory: Clear to auscultation bilaterally. No wheezes, rales or rhonchi. Normal respiratory effort.  Data Reviewed: Basic Metabolic Panel:  Lab 09/25/11 9562 09/24/11 0455 09/23/11 0630 09/22/11 0525 09/21/11 0700  NA 132* 133* 132* 134* 130*  K 4.0 3.9 -- -- --  CL 93* 94* 95* 95* 94*  CO2 30 31 25 31 30   GLUCOSE 97 99 96 86 99  BUN 33* 27* 27* 30* 35*  CREATININE 1.02 0.93 0.91 0.93 1.04  CALCIUM 9.1 9.5 9.4 9.3 9.0  MG -- -- -- -- --  PHOS -- -- -- -- --   Liver Function Tests:  Lab 09/19/11 1640  AST 17  ALT 12  ALKPHOS 86  BILITOT 0.3  PROT 6.4  ALBUMIN 2.9*   CBC:  Lab 09/25/11 0544 09/21/11 0700 09/19/11 1640  WBC 7.8 6.3 6.7  NEUTROABS -- -- --  HGB 9.0* 8.2* 9.2*  HCT 27.8* 26.0* 28.7*  MCV 82.0 82.8 82.0  PLT 174 SPECIMEN CHECKED FOR CLOTS 141*   CBG:  Lab 09/25/11 1149 09/25/11 0659 09/24/11 2119 09/24/11 1706 09/24/11 1218  GLUCAP 169* 95 121* 136* 114*   Scheduled Meds:    . allopurinol  100 mg Oral Daily  . clopidogrel  75 mg  Oral Daily  . enoxaparin  40 mg Subcutaneous Q24H  . ezetimibe-simvastatin  1 tablet Oral QHS  . feeding supplement  1 Container Oral BID BM  . furosemide  40 mg Oral BID  . guaiFENesin  600 mg Oral BID  . insulin aspart  0-15 Units Subcutaneous TID WC  . insulin aspart  0-5 Units Subcutaneous QHS  . isosorbide mononitrate  30 mg Oral Daily  . levofloxacin (LEVAQUIN) IV  750 mg Intravenous Q24H  . lisinopril  2.5 mg Oral Daily  . metoprolol tartrate  12.5 mg Oral BID  . pantoprazole  40 mg Oral Daily  . pregabalin  75 mg Oral Daily  . primidone  50 mg Oral Daily  . senna  1 tablet Oral BID  . sodium chloride  3 mL Intravenous Q12H  . vancomycin   750 mg Intravenous Q12H   Continuous Infusions:    Assessment/Plan: 1. Urinary tract infection: Empiric Levaquin. Followup culture. 2. Possible pneumonia: Continue Levaquin and vancomycin. Oxygen requirement stable. 3. Status post systolic congestive heart failure exacerbation: Resolved.  Continue lisinopril and metoprolol. Continue oral Lasix. 4. Hyponatremia: Felt to be secondary to hyperaldosteronism. Stable. 5. Dementia/acute encephalopathy: Resolved. Secondary to superimposed encephalopathy versus progression of dementia. Long-standing dementia per son. At baseline the patient spends most of the time either sleeping or watching TV. She does not walk. She is either bed bound or wheelchair bound. No focal neurologic deficits. MRI brain negative. Repeat urinalysis. Appears to be near baseline.  6. Diabetes mellitus type 2: Stable. Family wishes to avoid sliding scale insulin. 7. Hypertension: Stable. 8. Moderate malnutrition 9. Constipation: Bowel regimen.  Discussed with daughters at bedside.  Code Status: Full Family Communication: Spoke with family at bedside. Disposition Plan: Home with home health services and aide for 7 hours a day when stable.   Brendia Sacks, MD  [facility] Pager [phone] 09/25/2011, 3:46 PM    LOS: 6 days

## 2011-09-26 ENCOUNTER — Inpatient Hospital Stay (HOSPITAL_COMMUNITY): Payer: Medicare Other

## 2011-09-26 LAB — CBC
HCT: 28.3 % — ABNORMAL LOW (ref 36.0–46.0)
MCH: 26.5 pg (ref 26.0–34.0)
MCHC: 31.8 g/dL (ref 30.0–36.0)
MCV: 83.2 fL (ref 78.0–100.0)
RDW: 19.8 % — ABNORMAL HIGH (ref 11.5–15.5)

## 2011-09-26 LAB — COMPREHENSIVE METABOLIC PANEL
Albumin: 2.8 g/dL — ABNORMAL LOW (ref 3.5–5.2)
BUN: 35 mg/dL — ABNORMAL HIGH (ref 6–23)
Calcium: 8.6 mg/dL (ref 8.4–10.5)
Creatinine, Ser: 1.13 mg/dL — ABNORMAL HIGH (ref 0.50–1.10)
GFR calc Af Amer: 53 mL/min — ABNORMAL LOW (ref >90)
Total Protein: 6 g/dL (ref 6.0–8.3)

## 2011-09-26 LAB — VANCOMYCIN, TROUGH: Vancomycin Tr: 28.5 ug/mL (ref 10.0–20.0)

## 2011-09-26 LAB — GLUCOSE, CAPILLARY
Glucose-Capillary: 111 mg/dL — ABNORMAL HIGH (ref 70–99)
Glucose-Capillary: 138 mg/dL — ABNORMAL HIGH (ref 70–99)
Glucose-Capillary: 183 mg/dL — ABNORMAL HIGH (ref 70–99)

## 2011-09-26 LAB — BASIC METABOLIC PANEL
Calcium: 8.8 mg/dL (ref 8.4–10.5)
Creatinine, Ser: 1.34 mg/dL — ABNORMAL HIGH (ref 0.50–1.10)
GFR calc Af Amer: 43 mL/min — ABNORMAL LOW (ref >90)

## 2011-09-26 LAB — PRO B NATRIURETIC PEPTIDE: Pro B Natriuretic peptide (BNP): 282.7 pg/mL (ref 0–450)

## 2011-09-26 MED ORDER — VANCOMYCIN HCL 1000 MG IV SOLR
750.0000 mg | INTRAVENOUS | Status: DC
  Administered 2011-09-27 – 2011-09-29 (×3): 750 mg via INTRAVENOUS
  Filled 2011-09-26 (×4): qty 750

## 2011-09-26 MED ORDER — IOHEXOL 300 MG/ML  SOLN
100.0000 mL | Freq: Once | INTRAMUSCULAR | Status: AC | PRN
  Administered 2011-09-26: 100 mL via INTRAVENOUS

## 2011-09-26 MED ORDER — LEVOFLOXACIN IN D5W 750 MG/150ML IV SOLN: 750.0000 mg | INTRAVENOUS | Status: DC

## 2011-09-26 MED ORDER — SODIUM CHLORIDE 0.9 % IV BOLUS (SEPSIS)
500.0000 mL | Freq: Once | INTRAVENOUS | Status: AC
  Administered 2011-09-26: 09:00:00 via INTRAVENOUS

## 2011-09-26 MED ORDER — GUAIFENESIN 100 MG/5ML PO SOLN
5.0000 mL | ORAL | Status: DC | PRN
  Administered 2011-09-26 – 2011-09-28 (×2): 100 mg via ORAL
  Filled 2011-09-26 (×2): qty 10

## 2011-09-26 MED ORDER — FUROSEMIDE 20 MG PO TABS
20.0000 mg | ORAL_TABLET | Freq: Two times a day (BID) | ORAL | Status: DC
  Administered 2011-09-26 – 2011-09-27 (×2): 20 mg via ORAL
  Filled 2011-09-26 (×3): qty 1

## 2011-09-26 NOTE — Progress Notes (Signed)
CRITICAL VALUE ALERT  Critical value received: high vancomycin trough  Date of notification:  09/26/11  Time of notification:  2312  Critical value read back:yes  Nurse who received alert:  Bethann Humble  MD notified (1st page):  Westley Hummer   Time of first page:  2317  MD notified (2nd page):  Time of second page:   Responding MD:  Westley Hummer   Time MD responded: 2320

## 2011-09-26 NOTE — Progress Notes (Signed)
UR complete 

## 2011-09-26 NOTE — Progress Notes (Signed)
ANTIBIOTIC CONSULT NOTE - FOLLOW UP  Pharmacy Consult for  Vancomycin Indication: rule out pneumonia  Allergies  Allergen Reactions  . Penicillins Itching and Swelling    Patient Measurements: Height: 5\' 3"  (160 cm) Weight: 203 lb 11.3 oz (92.4 kg) (bed scale) IBW/kg (Calculated) : 52.4    Vital Signs: Temp: 97.8 F (36.6 C) (01/02 2145) Temp src: Oral (01/02 2145) BP: 120/60 mmHg (01/02 2205) Pulse Rate: 97  (01/02 2205) Intake/Output from previous day: 01/01 0701 - 01/02 0700 In: 153 [I.V.:3; IV Piggyback:150] Out: -  Intake/Output from this shift: Total I/O In: 300 [IV Piggyback:300] Out: -   Labs:  Basename 09/26/11 2233 09/26/11 0514 09/25/11 0544  WBC 7.9 -- 7.8  HGB 9.0* -- 9.0*  PLT 172 -- 174  LABCREA -- -- --  CREATININE 1.13* 1.34* 1.02   Estimated Creatinine Clearance: 44.3 ml/min (by C-G formula based on Cr of 1.13).  Basename 09/26/11 2231  VANCOTROUGH 28.5*  VANCOPEAK --  Drue Dun --  GENTTROUGH --  GENTPEAK --  GENTRANDOM --  TOBRATROUGH --  TOBRAPEAK --  TOBRARND --  AMIKACINPEAK --  AMIKACINTROU --  AMIKACIN --     Microbiology: Recent Results (from the past 720 hour(s))  CULTURE, BLOOD (ROUTINE X 2)     Status: Normal   Collection Time   09/12/11  6:10 PM      Component Value Range Status Comment   Specimen Description BLOOD RIGHT ARM   Final    Special Requests BOTTLES DRAWN AEROBIC ONLY 3CC   Final    Setup Time [id number]   Final    Culture NO GROWTH 5 DAYS   Final    Report Status 09/19/2011 FINAL   Final   CULTURE, BLOOD (ROUTINE X 2)     Status: Normal   Collection Time   09/12/11  6:15 PM      Component Value Range Status Comment   Specimen Description BLOOD RIGHT HAND   Final    Special Requests BOTTLES DRAWN AEROBIC ONLY 3CC   Final    Setup Time [id number]   Final    Culture NO GROWTH 5 DAYS   Final    Report Status 09/19/2011 FINAL   Final   URINE CULTURE     Status: Normal   Collection Time   09/12/11   7:15 PM      Component Value Range Status Comment   Specimen Description URINE, CATHETERIZED   Final    Special Requests NONE   Final    Setup Time [id number]   Final    Colony Count >=100,000 COLONIES/ML   Final    Culture ESCHERICHIA COLI   Final    Report Status 09/14/2011 FINAL   Final    Organism ID, Bacteria ESCHERICHIA COLI   Final   URINE CULTURE     Status: Normal (Preliminary result)   Collection Time   09/24/11  3:00 PM      Component Value Range Status Comment   Specimen Description URINE, CLEAN CATCH   Final    Special Requests NONE   Final    Setup Time [id number]   Final    Colony Count >=100,000 COLONIES/ML   Final    Culture GRAM NEGATIVE RODS   Final    Report Status PENDING   Incomplete     Anti-infectives     Start     Dose/Rate Route Frequency Ordered Stop   09/28/11 2002   Levofloxacin (LEVAQUIN) IVPB 750 mg  750 mg 100 mL/hr over 90 Minutes Intravenous Every 48 hours 09/26/11 2225     09/24/11 2200   vancomycin (VANCOCIN) 750 mg in sodium chloride 0.9 % 150 mL IVPB        750 mg 150 mL/hr over 60 Minutes Intravenous Every 12 hours 09/24/11 2111     09/24/11 2000   Levofloxacin (LEVAQUIN) IVPB 750 mg  Status:  Discontinued        750 mg 100 mL/hr over 90 Minutes Intravenous Every 24 hours 09/24/11 1802 09/26/11 2225          Assessment: 78 yo F admitted with UTI, r/o PNA- now with Vanc trough = 28.5 mg/L.  Goal of Therapy:  Vancomycin trough level 15-20 mcg/ml  Plan:  Vancomycin level drawn b/f Vanc started- RN will take down bag hanging (not sure how much infused) Change Vancomycin to 750mg  IV q24H next dose @ 2000 1/3.  Susanne Greenhouse R 09/26/2011,11:28 PM

## 2011-09-26 NOTE — Progress Notes (Addendum)
[name] is a 78 y.o. female patient admitted day after Christmas with increasing lethargy. She is being treated for Health care associated PNA and gram negative uti. I have reviewed her chart, seen and examined her at the bed side in the presence of her daughter who is translating.  SUBJECTIVE Patient less confused today but still coughing.   1. Anemia     Past Medical History  Diagnosis Date  . Diabetes mellitus   . Hypertension   . Stroke Right Side Weakness   Current Facility-Administered Medications  Medication Dose Route Frequency Provider Last Rate Last Dose  . 0.9 %  sodium chloride infusion  250 mL Intravenous PRN Karyl Kinnier Stinson, DO      . acetaminophen (TYLENOL) tablet 650 mg  650 mg Oral Q4H PRN Karyl Kinnier Stinson, DO      . allopurinol (ZYLOPRIM) tablet 100 mg  100 mg Oral Daily Karyl Kinnier [place], DO   100 mg at 09/25/11 4098  . bisacodyl (DULCOLAX) suppository 10 mg  10 mg Rectal Daily PRN Pandora Leiter   10 mg at 09/25/11 1400  . clopidogrel (PLAVIX) tablet 75 mg  75 mg Oral Daily Karyl Kinnier [place], DO   75 mg at 09/25/11 1191  . colchicine tablet 0.6 mg  0.6 mg Oral BID PRN Karyl Kinnier [place]inson, DO      . enoxaparin (LOVENOX) injection 40 mg  40 mg Subcutaneous Q24H Karyl Kinnier [place], DO   40 mg at 09/25/11 2208  . ezetimibe-simvastatin (VYTORIN) 10-20 MG per tablet 1 tablet  1 tablet Oral QHS Karyl Kinnier [place], DO   1 tablet at 09/25/11 2208  . feeding supplement (ENSURE) pudding 1 Container  1 Container Oral BID BM Pandora Leiter   1 Container at 09/25/11 1442  . furosemide (LASIX) tablet 20 mg  20 mg Oral BID Jamicah Anstead      . guaiFENesin (MUCINEX) 12 hr tablet 600 mg  600 mg Oral BID Lennette Bihari, MD   600 mg at 09/25/11 2208  . insulin aspart (novoLOG) injection 0-15 Units  0-15 Units Subcutaneous TID WC Karyl Kinnier [place], DO   2 Units at 09/23/11 1246  . insulin aspart (novoLOG) injection 0-5 Units  0-5 Units  Subcutaneous QHS Karyl Kinnier Stinson, DO      . isosorbide mononitrate (IMDUR) 24 hr tablet 30 mg  30 mg Oral Daily Karyl Kinnier [place], DO   30 mg at 09/25/11 0936  . Levofloxacin (LEVAQUIN) IVPB 750 mg  750 mg Intravenous Q24H Reina Fuse [place]   750 mg at 09/25/11 2046  . lisinopril (PRINIVIL,ZESTRIL) tablet 2.5 mg  2.5 mg Oral Daily Karyl Kinnier [place], DO   2.5 mg at 09/25/11 4782  . metoprolol tartrate (LOPRESSOR) tablet 12.5 mg  12.5 mg Oral BID Karyl Kinnier [place], DO   12.5 mg at 09/25/11 9562  . ondansetron (ZOFRAN) injection 4 mg  4 mg Intravenous Q6H PRN Karyl Kinnier [place], DO      . pantoprazole (PROTONIX) EC tablet 40 mg  40 mg Oral Daily Karyl Kinnier [place], DO   40 mg at 09/25/11 1308  . polyethylene glycol (MIRALAX / GLYCOLAX) packet 17 g  17 g Oral Daily Pandora Leiter   17 g at 09/25/11 1712  . pregabalin (LYRICA) capsule 75 mg  75 mg Oral Daily Karyl Kinnier [place], DO   75 mg at 09/25/11 0936  . primidone (MYSOLINE) tablet 50 mg  50 mg Oral Daily Karyl Kinnier  [place], DO   50 mg at 09/25/11 0937  . senna-docusate (Senokot-S) tablet 1 tablet  1 tablet Oral BID Pandora Leiter   1 tablet at 09/25/11 2208  . sodium chloride 0.9 % injection 3 mL  3 mL Intravenous Q12H Karyl Kinnier [place], DO   3 mL at 09/25/11 2200  . sodium chloride 0.9 % injection 3 mL  3 mL Intravenous PRN Karyl Kinnier [place], DO      . vancomycin (VANCOCIN) 750 mg in sodium chloride 0.9 % 150 mL IVPB  750 mg Intravenous Q12H Mal Amabile [place], PHARMD   750 mg at 09/25/11 2208  . DISCONTD: furosemide (LASIX) tablet 40 mg  40 mg Oral BID Pandora Leiter   40 mg at 09/26/11 0732  . DISCONTD: senna (SENOKOT) tablet 8.6 mg  1 tablet Oral BID Karyl Kinnier [place], DO   8.6 mg at 09/25/11 1610   Allergies  Allergen Reactions  . Penicillins Itching and Swelling   Active Problems:  Hyponatremia  CHF exacerbation   Vital signs in last 24 hours: Temp:  [96 F (35.6 C)-98.1 F  (36.7 C)] 96 F (35.6 C) (01/02 0700) Pulse Rate:  [81-92] 92  (01/02 0700) Resp:  [16-18] 18  (01/02 0700) BP: (88-104)/(37-58) 88/56 mmHg (01/02 0700) SpO2:  [94 %-99 %] 94 % (01/02 0700) Weight:  [92.4 kg (203 lb 11.3 oz)] 203 lb 11.3 oz (92.4 kg) (01/02 0700) Weight change: 1.2 kg (2 lb 10.3 oz) Last BM Date: 09/25/11  Intake/Output from previous day: 01/01 0701 - 01/02 0700 In: 153 [I.V.:3; IV Piggyback:150] Out: -  Intake/Output this shift:    Lab Results:  Basename 09/25/11 0544  WBC 7.8  HGB 9.0*  HCT 27.8*  PLT 174   BMET  Basename 09/26/11 0514 09/25/11 0544  NA 130* 132*  K 4.1 4.0  CL 91* 93*  CO2 31 30  GLUCOSE 88 97  BUN 37* 33*  CREATININE 1.34* 1.02  CALCIUM 8.8 9.1    Studies/Results: Dg Chest 2 View  09/24/2011  *RADIOLOGY REPORT*  Clinical Data: 77 year old female with shortness of breath.  CHEST - 2 VIEW  Comparison: 09/22/2011 and earlier.  Findings: AP upright and lateral views of the chest.  Continued patchy and confluent retrocardiac opacity which has increased compared to 09/19/2011.  No significant change since 09/22/2011. Stable lung volumes. Stable cardiomegaly and mediastinal contours. Sequelae of CABG.  No pneumothorax, pulmonary edema or definite effusion.  Osteopenia.  IMPRESSION: Left lower lobe airspace disease suspicious for pneumonia has not significantly changed since 09/22/2011.  Original Report Authenticated By: Harley Hallmark, M.D.    Medications: I have reviewed the patient's current medications.   Physical exam GENERAL- alert HEAD- normal atraumatic, no neck masses, normal thyroid, no jvd RESPIRATORY- appears well, vitals normal, no respiratory distress, acyanotic, normal RR, ear and throat exam is normal, neck free of mass or lymphadenopathy, chest clear, no wheezing, crepitations, rhonchi, normal symmetric air entry CVS- regular rate and rhythm, S1, S2 normal, no murmur, click, rub or gallop ABDOMEN- abdomen is soft  without significant tenderness, masses, organomegaly or guarding NEURO- Grossly normal EXTREMITIES- extremities normal, atraumatic, no cyanosis or edema  Plan 1. HCAP versus post obstructive pna- worry about slow clearance. Will continue vanc/levaquin, obtain ct chest r/o mass.  2. Hyponatremia- siadh versus chf. Check ct chest 3.  CHF exacerbation- compensated. BP low normal. Decrease lasix. 4. Gram negative uti- follow ID. Continue levaquin. 5. DM2- generally controlled. 6. DVT/gi prophylaxis. Discussed  plan of care with patient and daughter.    Bristyl Mclees 09/26/2011 8:06 AM Pager: [phone].   BP low normal/ some renal insufficuiency. Will d/c acei, give bolus of ivf, monitor.

## 2011-09-27 LAB — GLUCOSE, CAPILLARY: Glucose-Capillary: 106 mg/dL — ABNORMAL HIGH (ref 70–99)

## 2011-09-27 LAB — URINE CULTURE

## 2011-09-27 MED ORDER — SODIUM CHLORIDE 0.9 % IV SOLN: 250.0000 mL | INTRAVENOUS | Status: DC | PRN

## 2011-09-27 MED ORDER — FUROSEMIDE 20 MG PO TABS
20.0000 mg | ORAL_TABLET | Freq: Every day | ORAL | Status: DC
  Administered 2011-09-28 – 2011-09-30 (×3): 20 mg via ORAL
  Filled 2011-09-27 (×4): qty 1

## 2011-09-27 MED ORDER — DEXTROSE 5 % IV SOLN
1.0000 g | Freq: Three times a day (TID) | INTRAVENOUS | Status: DC
  Administered 2011-09-27 – 2011-09-28 (×4): 1 g via INTRAVENOUS
  Administered 2011-09-28: 10:00:00 via INTRAVENOUS
  Administered 2011-09-29 – 2011-09-30 (×5): 1 g via INTRAVENOUS
  Filled 2011-09-27 (×11): qty 1

## 2011-09-27 NOTE — Plan of Care (Signed)
Problem: Inadequate Intake (NI-2.1) Goal: Food and/or nutrient delivery Individualized approach for food/nutrient provision.  Outcome: Not Progressing Pt ate 50% of breakfast, however still sometimes sleeping through meals.

## 2011-09-27 NOTE — Progress Notes (Signed)
ANTIBIOTIC CONSULT NOTE - FOLLOW UP  Pharmacy Consult for  Vancomycin/Aztreonam Indication: HCAP, MDR Ecoli UTI  Allergies  Allergen Reactions  . Penicillins Itching and Swelling    Patient Measurements: Height: 5\' 3"  (160 cm) Weight: 203 lb 11.3 oz (92.4 kg) IBW/kg (Calculated) : 52.4    Vital Signs: Temp: 98.6 F (37 C) (01/03 0634) Temp src: Oral (01/03 0634) BP: 105/51 mmHg (01/03 0634) Pulse Rate: 77  (01/03 0634) Intake/Output from previous day: 01/02 0701 - 01/03 0700 In: 1250 [I.V.:500; IV Piggyback:750] Out: -  Intake/Output from this shift:    Labs:  Basename 09/26/11 2233 09/26/11 0514 09/25/11 0544  WBC 7.9 -- 7.8  HGB 9.0* -- 9.0*  PLT 172 -- 174  LABCREA -- -- --  CREATININE 1.13* 1.34* 1.02   Estimated Creatinine Clearance: 44.3 ml/min (by C-G formula based on Cr of 1.13).  Basename 09/26/11 2231  VANCOTROUGH 28.5*  VANCOPEAK --  Drue Dun --  GENTTROUGH --  GENTPEAK --  GENTRANDOM --  TOBRATROUGH --  TOBRAPEAK --  TOBRARND --  AMIKACINPEAK --  AMIKACINTROU --  AMIKACIN --     Microbiology: Recent Results (from the past 720 hour(s))  CULTURE, BLOOD (ROUTINE X 2)     Status: Normal   Collection Time   09/12/11  6:10 PM      Component Value Range Status Comment   Specimen Description BLOOD RIGHT ARM   Final    Special Requests BOTTLES DRAWN AEROBIC ONLY 3CC   Final    Setup Time [id number]   Final    Culture NO GROWTH 5 DAYS   Final    Report Status 09/19/2011 FINAL   Final   CULTURE, BLOOD (ROUTINE X 2)     Status: Normal   Collection Time   09/12/11  6:15 PM      Component Value Range Status Comment   Specimen Description BLOOD RIGHT HAND   Final    Special Requests BOTTLES DRAWN AEROBIC ONLY 3CC   Final    Setup Time [id number]   Final    Culture NO GROWTH 5 DAYS   Final    Report Status 09/19/2011 FINAL   Final   URINE CULTURE     Status: Normal   Collection Time   09/12/11  7:15 PM      Component Value Range Status  Comment   Specimen Description URINE, CATHETERIZED   Final    Special Requests NONE   Final    Setup Time [id number]   Final    Colony Count >=100,000 COLONIES/ML   Final    Culture ESCHERICHIA COLI   Final    Report Status 09/14/2011 FINAL   Final    Organism ID, Bacteria ESCHERICHIA COLI   Final   URINE CULTURE     Status: Normal   Collection Time   09/24/11  3:00 PM      Component Value Range Status Comment   Specimen Description URINE, CLEAN CATCH   Final    Special Requests NONE   Final    Setup Time [id number]   Final    Colony Count >=100,000 COLONIES/ML   Final    Culture ESCHERICHIA COLI   Final    Report Status 09/27/2011 FINAL   Final    Organism ID, Bacteria ESCHERICHIA COLI   Final     Anti-infectives     Start     Dose/Rate Route Frequency Ordered Stop   09/28/11 2002   Levofloxacin (LEVAQUIN) IVPB 750 mg  Status:  Discontinued        750 mg 100 mL/hr over 90 Minutes Intravenous Every 48 hours 09/26/11 2225 09/27/11 0746   09/27/11 2000   vancomycin (VANCOCIN) 750 mg in sodium chloride 0.9 % 150 mL IVPB        750 mg 150 mL/hr over 60 Minutes Intravenous Every 24 hours 09/26/11 2335     09/24/11 2200   vancomycin (VANCOCIN) 750 mg in sodium chloride 0.9 % 150 mL IVPB  Status:  Discontinued        750 mg 150 mL/hr over 60 Minutes Intravenous Every 12 hours 09/24/11 2111 09/26/11 2335   09/24/11 2000   Levofloxacin (LEVAQUIN) IVPB 750 mg  Status:  Discontinued        750 mg 100 mL/hr over 90 Minutes Intravenous Every 24 hours 09/24/11 1802 09/26/11 2225          Assessment: Day #4 Vancomycin for HCAP. To change Levaquin to Aztreonam for HCAP coverage as well as treat MDR Ecoli UTI. Note patient with PCN allergy of swelling and did not want to start cephalosporin as a result.  Goal of Therapy:  Vancomycin trough level 15-20 mcg/ml, Aztreonam adjustment per renal function  Plan:  1. Continue Vancomycin 750mg  IV q24 as already ordered 2. Aztreonam 1g  IV q8 to be started for CrCl > 30 ml/min   Hessie Knows, PharmD, BCPS 09/27/2011 7:52 AM [phone]

## 2011-09-27 NOTE — Progress Notes (Signed)
Reviewed CT chest and UC results. SUBJECTIVE "so,so"   1. Anemia     Past Medical History  Diagnosis Date  . Diabetes mellitus   . Hypertension   . Stroke Right Side Weakness   Current Facility-Administered Medications  Medication Dose Route Frequency Provider Last Rate Last Dose  . 0.9 %  sodium chloride infusion  250 mL Intravenous PRN Kenlynn Houde      . acetaminophen (TYLENOL) tablet 650 mg  650 mg Oral Q4H PRN Karyl Kinnier [place], DO      . allopurinol (ZYLOPRIM) tablet 100 mg  100 mg Oral Daily Karyl Kinnier [place], DO   100 mg at 09/27/11 4098  . aztreonam (AZACTAM) 1 g in dextrose 5 % 50 mL IVPB  1 g Intravenous Q8H Bertin Inabinet   1 g at 09/27/11 0856  . bisacodyl (DULCOLAX) suppository 10 mg  10 mg Rectal Daily PRN Pandora Leiter   10 mg at 09/25/11 1400  . clopidogrel (PLAVIX) tablet 75 mg  75 mg Oral Daily Karyl Kinnier [place], DO   75 mg at 09/27/11 1191  . colchicine tablet 0.6 mg  0.6 mg Oral BID PRN Karyl Kinnier [place], DO      . enoxaparin (LOVENOX) injection 40 mg  40 mg Subcutaneous Q24H Karyl Kinnier [place], DO   40 mg at 09/26/11 2206  . ezetimibe-simvastatin (VYTORIN) 10-20 MG per tablet 1 tablet  1 tablet Oral QHS Karyl Kinnier [place], DO   1 tablet at 09/26/11 2205  . feeding supplement (ENSURE) pudding 1 Container  1 Container Oral BID BM Pandora Leiter   1 Container at 09/27/11 1441  . furosemide (LASIX) tablet 20 mg  20 mg Oral Daily Khayree Delellis      . guaiFENesin (MUCINEX) 12 hr tablet 600 mg  600 mg Oral BID Lennette Bihari, MD   600 mg at 09/27/11 4782  . guaiFENesin (ROBITUSSIN) 100 MG/5ML solution 100 mg  5 mL Oral Q4H PRN Selenia Mihok   100 mg at 09/26/11 2220  . insulin aspart (novoLOG) injection 0-15 Units  0-15 Units Subcutaneous TID WC Karyl Kinnier [place], DO   2 Units at 09/23/11 1246  . insulin aspart (novoLOG) injection 0-5 Units  0-5 Units Subcutaneous QHS Karyl Kinnier [place], DO      . isosorbide mononitrate (IMDUR) 24 hr  tablet 30 mg  30 mg Oral Daily Karyl Kinnier [place], DO   30 mg at 09/27/11 9562  . metoprolol tartrate (LOPRESSOR) tablet 12.5 mg  12.5 mg Oral BID Karyl Kinnier [place], DO   12.5 mg at 09/26/11 2205  . ondansetron (ZOFRAN) injection 4 mg  4 mg Intravenous Q6H PRN Karyl Kinnier [place], DO      . pantoprazole (PROTONIX) EC tablet 40 mg  40 mg Oral Daily Karyl Kinnier [place], DO   40 mg at 09/27/11 1308  . polyethylene glycol (MIRALAX / GLYCOLAX) packet 17 g  17 g Oral Daily Pandora Leiter   17 g at 09/26/11 6578  . pregabalin (LYRICA) capsule 75 mg  75 mg Oral Daily Karyl Kinnier [place], DO   75 mg at 09/27/11 4696  . primidone (MYSOLINE) tablet 50 mg  50 mg Oral Daily Karyl Kinnier [place], DO   50 mg at 09/27/11 2952  . senna-docusate (Senokot-S) tablet 1 tablet  1 tablet Oral BID Pandora Leiter   1 tablet at 09/27/11 8413  . sodium chloride 0.9 % injection 3 mL  3 mL Intravenous Q12H Karyl Kinnier [place],  DO   3 mL at 09/27/11 0929  . sodium chloride 0.9 % injection 3 mL  3 mL Intravenous PRN Karyl Kinnier [place], DO      . vancomycin (VANCOCIN) 750 mg in sodium chloride 0.9 % 150 mL IVPB  750 mg Intravenous Q24H Lorenza Evangelist, PHARMD      . DISCONTD: 0.9 %  sodium chloride infusion  250 mL Intravenous PRN Karyl Kinnier [place], DO      . DISCONTD: furosemide (LASIX) tablet 20 mg  20 mg Oral BID Keeli Roberg   20 mg at 09/27/11 0749  . DISCONTD: Levofloxacin (LEVAQUIN) IVPB 750 mg  750 mg Intravenous Q24H Reina Fuse [place]   750 mg at 09/26/11 2002  . DISCONTD: Levofloxacin (LEVAQUIN) IVPB 750 mg  750 mg Intravenous Q48H Christian M [place], [place]      . DISCONTD: vancomycin (VANCOCIN) 750 mg in sodium chloride 0.9 % 150 mL IVPB  750 mg Intravenous Q12H Christine Elizabeth [place], PHARMD   750 mg at 09/26/11 2244   Allergies  Allergen Reactions  . Penicillins Itching and Swelling   Active Problems:  Hyponatremia  CHF exacerbation   Vital signs in last 24 hours: Temp:   [97.8 F (36.6 C)-99.1 F (37.3 C)] 99.1 F (37.3 C) (01/03 1400) Pulse Rate:  [75-97] 75  (01/03 1400) Resp:  [18-20] 18  (01/03 1400) BP: (105-120)/(51-61) 120/51 mmHg (01/03 1400) SpO2:  [100 %] 100 % (01/03 1400) Weight:  [92.4 kg (203 lb 11.3 oz)] 203 lb 11.3 oz (92.4 kg) (01/03 0634) Weight change: 0 kg (0 lb) Last BM Date: 09/25/11  Intake/Output from previous day: 01/02 0701 - 01/03 0700 In: 1250 [I.V.:500; IV Piggyback:750] Out: -  Intake/Output this shift: Total I/O In: 50 [IV Piggyback:50] Out: -   Lab Results:  [facility] 09/26/11 2233 09/25/11 0544  WBC 7.9 7.8  HGB 9.0* 9.0*  HCT 28.3* 27.8*  PLT 172 174   BMET  Basename 09/26/11 2233 09/26/11 0514  NA 130* 130*  K 4.2 4.1  CL 92* 91*  CO2 28 31  GLUCOSE 167* 88  BUN 35* 37*  CREATININE 1.13* 1.34*  CALCIUM 8.6 8.8    Studies/Results: Ct Chest W Contrast  09/26/2011  *RADIOLOGY REPORT*  Clinical Data: Cough, fever, shortness of breath  CT CHEST WITH CONTRAST  Technique:  Multidetector CT imaging of the chest was performed following the standard protocol during bolus administration of intravenous contrast.  Contrast:  100 ml Omnipaque-300 IV  Comparison: Chest radiographs dated 09/24/2011  Findings: Trace left pleural effusion.  Associated left lower lobe opacity, at least some of which is likely atelectasis, pneumonia not excluded.  Mild lingular the right middle lobe scarring versus atelectasis.  No suspicious pulmonary nodules.  No pneumothorax.  Visualized thyroid is notable for bilateral thyroid nodules.  Cardiomegaly.  No pericardial effusion.  Coronary atherosclerosis. Postsurgical changes related to prior CABG.  Atherosclerotic calcifications of the aortic arch.  No suspicious mediastinal, hilar, or axillary lymphadenopathy.  Visualized upper abdomen is notable for a suspected layering gallstones.  Mild degenerative changes of the visualized thoracolumbar spine.  IMPRESSION: Trace left pleural effusion.   Associated left lower lobe opacity, at least some of which is likely atelectasis, pneumonia not excluded.  No suspicious pulmonary nodules.  Cardiomegaly.  Bilateral thyroid nodules.  Cholelithiasis.  Original Report Authenticated By: Charline Bills, M.D.    Medications: I have reviewed the patient's current medications.   Physical exam GENERAL- alert HEAD- normal atraumatic, no neck masses,  normal thyroid, no jvd RESPIRATORY- appears well, vitals normal, no respiratory distress, acyanotic, normal RR, ear and throat exam is normal, neck free of mass or lymphadenopathy, chest clear, no wheezing, crepitations, rhonchi, normal symmetric air entry CVS- regular rate and rhythm, S1, S2 normal, no murmur, click, rub or gallop ABDOMEN- abdomen is soft without significant tenderness, masses, organomegaly or guarding NEURO- Grossly normal EXTREMITIES- extremities normal, atraumatic, no cyanosis or edema  Plan  1. HCAP versus post obstructive pna-LLL pna with para pneumonic effusion, slow clearing. Continue vanc. Switch to azactam. 2. AKI/Hyponatremia-multifactorial. monitor with ivf. 3. CHF exacerbation- compensated. 4. E.coli uti- multidrug resistant. Started on azactam. 5. DM2- generally controlled.  6. DVT/gi prophylaxis.  Discussed plan of care with patient and daughter      Marra Fraga 09/27/2011 2:57 PM Pager: [phone].

## 2011-09-27 NOTE — Progress Notes (Addendum)
Physical Therapy Evaluation Patient Details Name: [name] MRN: [id number] DOB: 1931/04/17 Today's Date: 09/27/2011 [id number] Problem List:  Patient Active Problem List  Diagnoses  . Altered mental status  . Hyponatremia  . Diabetes mellitus  . Pulmonary edema  . Bilateral carotid artery disease  . Hypertension  . GERD (gastroesophageal reflux disease)  . Iron deficiency anemia  . Hyperlipidemia  . Urinary tract infection  . CHF exacerbation  . Chronic systolic heart failure  . Thrombocytopenia    Past Medical History:  Past Medical History  Diagnosis Date  . Diabetes mellitus   . Hypertension   . Stroke Right Side Weakness   Past Surgical History:  Past Surgical History  Procedure Date  . Cardiac surgery   . Coronary artery bypass graft     PT Assessment/Plan/Recommendation PT Assessment Clinical Impression Statement: pt has been readmitted to hospital, pt is at baseline and does not require skilled PT. pt's family provide total care and has DME. PT to sign off PT Recommendation/Assessment: Patent does not need any further PT services No Skilled PT: Patient at baseline level of functioning;Patient will have necessary level of assist by caregiver at discharge PT Recommendation Follow Up Recommendations: 24 hour supervision/assistance Equipment Recommended:  (airoverlay mattress) PT Goals     PT Evaluation Precautions/Restrictions    Prior Functioning  Home Living Lives With: Family Receives Help From: Family Type of Home: House Home Layout: One level Home Adaptive Equipment: Wheelchair - manual (hoyer lift) Prior Function Level of Independence: Needs assistance with ADLs (pt is total assist with all activities) Comments: per daughter, pt is total care and uses lift Cognition Cognition Arousal/Alertness: Awake/alert Overall Cognitive Status: Appears within functional limits for tasks assessed Orientation Level:  (unable to assess) Cognition  - Other Comments: follows commands in her language Sensation/Coordination Coordination Gross Motor Movements are Fluid and Coordinated: No Extremity Assessment RUE Assessment RUE Assessment: Exceptions to [facility] RUE AROM (degrees) RUE Overall AROM Comments: decreased elevation of active ROM shoulder and decreased hand grip LUE Assessment LUE Assessment: Within Functional Limits (grossly) RLE Assessment RLE Assessment: Exceptions to [facility] RLE Strength RLE Overall Strength Comments: decreased antigravity strength at knee and hip LLE Assessment LLE Assessment: Exceptions to [facility] LLE Strength LLE Overall Strength Comments: pt has decreased general strength due to debility Mobility (including Balance) Bed Mobility Bed Mobility: Yes Rolling Right: 1: +2 Total assist Rolling Right Details (indicate cue type and reason): pt=20 Rolling Left: 1: +2 Total assist Rolling Left Details (indicate cue type and reason): pt=20 Supine to Sit: 1: +2 Total assist Supine to Sit Details (indicate cue type and reason): pt==10 Sit to Supine - Left: 1: +2 Total assist Sit to Supine - Left Details (indicate cue type and reason): pt=0 Transfers Transfers: No  Posture/Postural Control Posture/Postural Control: Postural limitations Postural Limitations: obesity Balance Balance Assessed: Yes Static Sitting Balance Static Sitting - Balance Support: Bilateral upper extremity supported Static Sitting - Level of Assistance: 4: Min assist Static Sitting - Comment/# of Minutes: 10 Exercise  Other Exercises Other Exercises: daughter and patient able to demonstrate part of  the exercise program they do at home End of Session PT - End of Session Activity Tolerance: Patient limited by fatigue;Patient tolerated treatment well Patient left: in bed;with call bell in reach;with family/visitor present Nurse Communication: Mobility status for transfers General Behavior During Session: [facility] for tasks performed Cognition:  [facility] for tasks performed  Rada Hay 09/27/2011, 4:19 PM

## 2011-09-27 NOTE — Progress Notes (Signed)
Nutrition Follow-up  Diet Order: Heart healthy with fluid restriction Ensure pudding BID  - Daughter reports pt ate 50% of breakfast today, unsure of pudding intake. Sodium improving from 134 on 12/28 to 130 yesterday. Pt has a dime size stage II pressure ulcer on sacrum. Daughter brought in protein powder, similar in content to Beneprotein, which she has been giving to pt to improve protein intake. No pulmonary edema shown in DG of chest 12/31.   Meds: Scheduled Meds:   . allopurinol  100 mg Oral Daily  . aztreonam  1 g Intravenous Q8H  . clopidogrel  75 mg Oral Daily  . enoxaparin  40 mg Subcutaneous Q24H  . ezetimibe-simvastatin  1 tablet Oral QHS  . feeding supplement  1 Container Oral BID BM  . furosemide  20 mg Oral BID  . guaiFENesin  600 mg Oral BID  . insulin aspart  0-15 Units Subcutaneous TID WC  . insulin aspart  0-5 Units Subcutaneous QHS  . isosorbide mononitrate  30 mg Oral Daily  . metoprolol tartrate  12.5 mg Oral BID  . pantoprazole  40 mg Oral Daily  . polyethylene glycol  17 g Oral Daily  . pregabalin  75 mg Oral Daily  . primidone  50 mg Oral Daily  . senna-docusate  1 tablet Oral BID  . sodium chloride  3 mL Intravenous Q12H  . vancomycin  750 mg Intravenous Q24H  . DISCONTD: levofloxacin (LEVAQUIN) IV  750 mg Intravenous Q24H  . DISCONTD: levofloxacin (LEVAQUIN) IV  750 mg Intravenous Q48H  . DISCONTD: vancomycin  750 mg Intravenous Q12H   Continuous Infusions:  PRN Meds:.sodium chloride, acetaminophen, bisacodyl, colchicine, guaiFENesin, ondansetron (ZOFRAN) IV, sodium chloride  Labs:  CMP     Component Value Date/Time   NA 130* 09/26/2011 2233   K 4.2 09/26/2011 2233   CL 92* 09/26/2011 2233   CO2 28 09/26/2011 2233   GLUCOSE 167* 09/26/2011 2233   BUN 35* 09/26/2011 2233   CREATININE 1.13* 09/26/2011 2233   CALCIUM 8.6 09/26/2011 2233   PROT 6.0 09/26/2011 2233   ALBUMIN 2.8* 09/26/2011 2233   AST 13 09/26/2011 2233   ALT 8 09/26/2011 2233   ALKPHOS 74  09/26/2011 2233   BILITOT 0.2* 09/26/2011 2233   GFRNONAA 45* 09/26/2011 2233   GFRAA 53* 09/26/2011 2233   CBG (last 3)   Basename 09/27/11 1233 09/27/11 0752 09/26/11 2256  GLUCAP 145* 106* 183*     Intake/Output Summary (Last 24 hours) at 09/27/11 1448 Last data filed at 09/27/11 0856  Gross per 24 hour  Intake    500 ml  Output      0 ml  Net    500 ml  Last BM - 09/26/11  Weight Status:   09/21/11  92.7kg 09/27/11      92.4kg  Nutrition Dx:  Inadequate oral intake - improving  Goal: Pt to consume >50% of meals/supplements - not met consistently.   New goal: Pt to consistently consume >50% of meals.  Intervention: Daughter requested information on low sodium diet. Reviewed food sources of sodium and provided daughter with handout of this information. RD contact information provided. Daughter states that her mother does like to use a lot of salt on foods and she will work on eliminating this habit. Education needs addressed. Encouraged continued increased intake.   Monitor: Intake, labs, weights   Marshall Cork Pager #:  [phone]

## 2011-09-28 LAB — BASIC METABOLIC PANEL
CO2: 27 mEq/L (ref 19–32)
Chloride: 91 mEq/L — ABNORMAL LOW (ref 96–112)
Creatinine, Ser: 1.03 mg/dL (ref 0.50–1.10)
GFR calc Af Amer: 59 mL/min — ABNORMAL LOW (ref >90)
Sodium: 127 mEq/L — ABNORMAL LOW (ref 135–145)

## 2011-09-28 LAB — GLUCOSE, CAPILLARY
Glucose-Capillary: 110 mg/dL — ABNORMAL HIGH (ref 70–99)
Glucose-Capillary: 96 mg/dL (ref 70–99)
Glucose-Capillary: 164 mg/dL — ABNORMAL HIGH (ref 70–99)

## 2011-09-28 LAB — CARDIAC PANEL(CRET KIN+CKTOT+MB+TROPI)
Relative Index: INVALID (ref 0.0–2.5)
Total CK: 37 U/L (ref 7–177)

## 2011-09-28 LAB — MAGNESIUM: Magnesium: 2.1 mg/dL (ref 1.5–2.5)

## 2011-09-28 NOTE — Progress Notes (Signed)
Dr. Venetia Constable aware via phone pt ran brief period of V-tach at 29. Monitor clerk notified RN. Pt symptomatic. See new order entered by physician.

## 2011-09-28 NOTE — Progress Notes (Signed)
[name] feels better. I have reviewed telemetry strips which suggested nsvt, asymptomatic. She is more with it and wants to go home.    1. Anemia     Past Medical History  Diagnosis Date  . Diabetes mellitus   . Hypertension   . Stroke Right Side Weakness   Current Facility-Administered Medications  Medication Dose Route Frequency Provider Last Rate Last Dose  . acetaminophen (TYLENOL) tablet 650 mg  650 mg Oral Q4H PRN Monique Kinnier [place], DO      . allopurinol (ZYLOPRIM) tablet 100 mg  100 mg Oral Daily Monique Kinnier [place], DO   100 mg at 09/28/11 1033  . aztreonam (AZACTAM) 1 g in dextrose 5 % 50 mL IVPB  1 g Intravenous Q8H [name]   1 g at 09/28/11 1741  . bisacodyl (DULCOLAX) suppository 10 mg  10 mg Rectal Daily PRN [name]   10 mg at 09/25/11 1400  . clopidogrel (PLAVIX) tablet 75 mg  75 mg Oral Daily Monique Kinnier [place], DO   75 mg at 09/28/11 1033  . colchicine tablet 0.6 mg  0.6 mg Oral BID PRN Monique Kinnier [place], DO      . enoxaparin (LOVENOX) injection 40 mg  40 mg Subcutaneous Q24H Monique Kinnier [place], DO   40 mg at 09/28/11 0117  . ezetimibe-simvastatin (VYTORIN) 10-20 MG per tablet 1 tablet  1 tablet Oral QHS Monique Kinnier [place], DO   1 tablet at 09/28/11 0125  . feeding supplement (ENSURE) pudding 1 Container  1 Container Oral BID BM [name]   1 Container at 09/28/11 1545  . furosemide (LASIX) tablet 20 mg  20 mg Oral Daily [name]   20 mg at 09/28/11 1034  . guaiFENesin (MUCINEX) 12 hr tablet 600 mg  600 mg Oral BID Monique Bihari, MD   600 mg at 09/28/11 1034  . guaiFENesin (ROBITUSSIN) 100 MG/5ML solution 100 mg  5 mL Oral Q4H PRN [name]   100 mg at 09/28/11 0116  . insulin aspart (novoLOG) injection 0-15 Units  0-15 Units Subcutaneous TID WC Monique Kinnier [place], DO   2 Units at 09/23/11 1246  . insulin aspart (novoLOG) injection 0-5 Units  0-5 Units Subcutaneous QHS Monique Kinnier [place], DO      . isosorbide  mononitrate (IMDUR) 24 hr tablet 30 mg  30 mg Oral Daily Monique Kinnier [place], DO   30 mg at 09/28/11 1035  . metoprolol tartrate (LOPRESSOR) tablet 12.5 mg  12.5 mg Oral BID Monique Kinnier [place], DO   12.5 mg at 09/28/11 1035  . ondansetron (ZOFRAN) injection 4 mg  4 mg Intravenous Q6H PRN Monique Kinnier [place], DO      . pantoprazole (PROTONIX) EC tablet 40 mg  40 mg Oral Daily Monique Kinnier [place], DO   40 mg at 09/28/11 1035  . polyethylene glycol (MIRALAX / GLYCOLAX) packet 17 g  17 g Oral Daily [name]   17 g at 09/28/11 1035  . pregabalin (LYRICA) capsule 75 mg  75 mg Oral Daily [name], DO   75 mg at 09/28/11 1039  . primidone (MYSOLINE) tablet 50 mg  50 mg Oral Daily Monique Kinnier [place], DO   50 mg at 09/28/11 1036  . senna-docusate (Senokot-S) tablet 1 tablet  1 tablet Oral BID [name]   1 tablet at 09/28/11 1039  . sodium chloride 0.9 % injection 3 mL  3 mL Intravenous Q12H Monique Kinnier [place],  DO   3 mL at 09/28/11 1036  . sodium chloride 0.9 % injection 3 mL  3 mL Intravenous PRN Monique Kinnier [place], DO      . vancomycin (VANCOCIN) 750 mg in sodium chloride 0.9 % 150 mL IVPB  750 mg Intravenous Q24H [name], PHARMD   750 mg at 09/27/11 1945  . DISCONTD: 0.9 %  sodium chloride infusion  250 mL Intravenous PRN [name]       Allergies  Allergen Reactions  . Penicillins Itching and Swelling   Active Problems:  Hyponatremia  CHF exacerbation   Vital signs in last 24 hours: Temp:  [98 F (36.7 C)-99 F (37.2 C)] 98.3 F (36.8 C) (01/04 1440) Pulse Rate:  [69-84] 75  (01/04 1440) Resp:  [18-20] 18  (01/04 1440) BP: (112-122)/(50-67) 112/67 mmHg (01/04 1440) SpO2:  [99 %-100 %] 100 % (01/04 1440) Weight change:  Last BM Date: 09/28/11  Intake/Output from previous day: 01/03 0701 - 01/04 0700 In: 370 [P.O.:120; IV Piggyback:250] Out: -  Intake/Output this shift: Total I/O In: 3 [I.V.:3] Out: -   Lab  Results:  [name] 09/26/11 2233  WBC 7.9  HGB 9.0*  HCT 28.3*  PLT 172   BMET  Basename 09/28/11 0831 09/26/11 2233  NA 127* 130*  K 4.2 4.2  CL 91* 92*  CO2 27 28  GLUCOSE 117* 167*  BUN 31* 35*  CREATININE 1.03 1.13*  CALCIUM 8.9 8.6    Studies/Results: No results found.  Medications: I have reviewed the patient's current medications.   Physical exam GENERAL- alert HEAD- normal atraumatic, no neck masses, normal thyroid, no jvd RESPIRATORY- appears well, vitals normal, no respiratory distress, acyanotic, normal RR, ear and throat exam is normal, neck free of mass or lymphadenopathy, chest clear, no wheezing, crepitations, rhonchi, normal symmetric air entry CVS- regular rate and rhythm, S1, S2 normal, no murmur, click, rub or gallop ABDOMEN- abdomen is soft without significant tenderness, masses, organomegaly or guarding NEURO- Grossly normal EXTREMITIES- extremities normal, atraumatic, no cyanosis or edema  Plan  1. HCAP versus post obstructive pna-LLL pna with para pneumonic effusion, better. Continue vanc/azactam. 2. AKI/Hyponatremia-multifactorial. Better with ivf.  3. CHF exacerbation- compensated.  4. E.coli uti- multidrug resistant. Day2 on azactam.  5. DM2- generally controlled. 6. Hyponatremia- fluctuating. Monitor. 7. NSVT- ?artifact. Check cardiac enzymes, magnesium level. Continue beta blocker. Consider cardiology follow up if persistent. 6. DVT/gi prophylaxis.  Discussed plan of care with her husband.      Avianah Pellman 09/28/2011 5:48 PM Pager: [phone].

## 2011-09-29 ENCOUNTER — Other Ambulatory Visit: Payer: Self-pay

## 2011-09-29 LAB — GLUCOSE, CAPILLARY
Glucose-Capillary: 88 mg/dL (ref 70–99)
Glucose-Capillary: 127 mg/dL — ABNORMAL HIGH (ref 70–99)

## 2011-09-29 LAB — CARDIAC PANEL(CRET KIN+CKTOT+MB+TROPI)
Relative Index: INVALID (ref 0.0–2.5)
Total CK: 36 U/L (ref 7–177)
Troponin I: <0.3 ng/mL (ref <0.30)

## 2011-09-29 LAB — BASIC METABOLIC PANEL
BUN: 29 mg/dL — ABNORMAL HIGH (ref 6–23)
Creatinine, Ser: 0.93 mg/dL (ref 0.50–1.10)
GFR calc Af Amer: 66 mL/min — ABNORMAL LOW (ref >90)
GFR calc non Af Amer: 57 mL/min — ABNORMAL LOW (ref >90)
Potassium: 4.6 mEq/L (ref 3.5–5.1)

## 2011-09-29 NOTE — Progress Notes (Signed)
SUBJECTIVE ok   1. Anemia     Past Medical History  Diagnosis Date  . Diabetes mellitus   . Hypertension   . Stroke Right Side Weakness   Current Facility-Administered Medications  Medication Dose Route Frequency Provider Last Rate Last Dose  . acetaminophen (TYLENOL) tablet 650 mg  650 mg Oral Q4H PRN Karyl Kinnier Stinson, DO      . allopurinol (ZYLOPRIM) tablet 100 mg  100 mg Oral Daily Karyl Kinnier Stinson, DO   100 mg at 09/29/11 1055  . aztreonam (AZACTAM) 1 g in dextrose 5 % 50 mL IVPB  1 g Intravenous Q8H Shakaya Bhullar   1 g at 09/29/11 0819  . bisacodyl (DULCOLAX) suppository 10 mg  10 mg Rectal Daily PRN Pandora Leiter   10 mg at 09/25/11 1400  . clopidogrel (PLAVIX) tablet 75 mg  75 mg Oral Daily Karyl Kinnier Stinson, DO   75 mg at 09/29/11 1057  . colchicine tablet 0.6 mg  0.6 mg Oral BID PRN Karyl Kinnier Stinson, DO      . enoxaparin (LOVENOX) injection 40 mg  40 mg Subcutaneous Q24H Karyl Kinnier Garrison, DO   40 mg at 09/28/11 2248  . ezetimibe-simvastatin (VYTORIN) 10-20 MG per tablet 1 tablet  1 tablet Oral QHS Karyl Kinnier [place], DO   1 tablet at 09/28/11 2247  . feeding supplement (ENSURE) pudding 1 Container  1 Container Oral BID BM Pandora Leiter   1 Container at 09/29/11 1000  . furosemide (LASIX) tablet 20 mg  20 mg Oral Daily Aiesha Leland   20 mg at 09/29/11 1059  . guaiFENesin (MUCINEX) 12 hr tablet 600 mg  600 mg Oral BID Lennette Bihari, MD   600 mg at 09/29/11 1058  . guaiFENesin (ROBITUSSIN) 100 MG/5ML solution 100 mg  5 mL Oral Q4H PRN Roshard Rezabek   100 mg at 09/28/11 0116  . insulin aspart (novoLOG) injection 0-15 Units  0-15 Units Subcutaneous TID WC Karyl Kinnier [place], DO   2 Units at 09/23/11 1246  . insulin aspart (novoLOG) injection 0-5 Units  0-5 Units Subcutaneous QHS Karyl Kinnier Stinson, DO      . isosorbide mononitrate (IMDUR) 24 hr tablet 30 mg  30 mg Oral Daily Karyl Kinnier Stinson, DO   30 mg at 09/29/11 1058  . metoprolol  tartrate (LOPRESSOR) tablet 12.5 mg  12.5 mg Oral BID Karyl Kinnier Stinson, DO   12.5 mg at 09/29/11 1056  . ondansetron (ZOFRAN) injection 4 mg  4 mg Intravenous Q6H PRN Karyl Kinnier Stinson, DO      . pantoprazole (PROTONIX) EC tablet 40 mg  40 mg Oral Daily Karyl Kinnier Stinson, DO   40 mg at 09/29/11 1058  . polyethylene glycol (MIRALAX / GLYCOLAX) packet 17 g  17 g Oral Daily Pandora Leiter   17 g at 09/28/11 1035  . pregabalin (LYRICA) capsule 75 mg  75 mg Oral Daily [facility], DO   75 mg at 09/29/11 1057  . primidone (MYSOLINE) tablet 50 mg  50 mg Oral Daily Karyl Kinnier Stinson, DO   50 mg at 09/29/11 1058  . senna-docusate (Senokot-S) tablet 1 tablet  1 tablet Oral BID Pandora Leiter   1 tablet at 09/29/11 1058  . sodium chloride 0.9 % injection 3 mL  3 mL Intravenous Q12H Karyl Kinnier Stinson, DO   3 mL at 09/28/11 1036  . sodium chloride 0.9 % injection 3 mL  3 mL Intravenous PRN  Karyl Kinnier Stinson, DO      . vancomycin (VANCOCIN) 750 mg in sodium chloride 0.9 % 150 mL IVPB  750 mg Intravenous Q24H Lorenza Evangelist, PHARMD   750 mg at 09/28/11 2016  . DISCONTD: 0.9 %  sodium chloride infusion  250 mL Intravenous PRN Jaishaun Mcnab       Allergies  Allergen Reactions  . Penicillins Itching and Swelling   Active Problems:  Hyponatremia  CHF exacerbation   Vital signs in last 24 hours: Temp:  [98.3 F (36.8 C)-98.4 F (36.9 C)] 98.4 F (36.9 C) (01/05 0500) Pulse Rate:  [67-75] 70  (01/05 1055) Resp:  [17-18] 17  (01/05 0500) BP: (112-124)/(67-70) 124/70 mmHg (01/05 1055) SpO2:  [100 %] 100 % (01/05 0500) Weight change:  Last BM Date: 09/28/11  Intake/Output from previous day: 01/04 0701 - 01/05 0700 In: 3 [I.V.:3] Out: -  Intake/Output this shift:    Lab Results:  [facility] 09/26/11 2233  WBC 7.9  HGB 9.0*  HCT 28.3*  PLT 172   BMET  Basename 09/29/11 0030 09/28/11 0831  NA 129* 127*  K 4.6 4.2  CL 95* 91*  CO2 26 27  GLUCOSE  106* 117*  BUN 29* 31*  CREATININE 0.93 1.03  CALCIUM 8.7 8.9    Studies/Results: No results found.  Medications: I have reviewed the patient's current medications.   Physical exam GENERAL- alert HEAD- normal atraumatic, no neck masses, normal thyroid, no jvd RESPIRATORY- appears well, vitals normal, no respiratory distress, acyanotic, normal RR, ear and throat exam is normal, neck free of mass or lymphadenopathy, chest clear, no wheezing, crepitations, rhonchi, normal symmetric air entry CVS- regular rate and rhythm, S1, S2 normal, no murmur, click, rub or gallop ABDOMEN- abdomen is soft without significant tenderness, masses, organomegaly or guarding NEURO- Grossly normal EXTREMITIES- extremities normal, atraumatic, no cyanosis or edema  Plan  1. HCAP versus post obstructive pna-LLL pna with para pneumonic effusion, some occasional cough otherwise better. Continue vanc/azactam.  2. AKI/Hyponatremia-no significant change. 3. CHF exacerbation- compensated.  4. E.coli uti- multidrug resistant. Day3 on azactam. Switch to keflex in am. 5. DM2- generally controlled.  6. Hyponatremia- fluctuating. Monitor.  7. NSVT- ?artifact.  cardiac enzymes negative, magnesium level normal. D/c telemetry. 6. DVT/gi prophylaxis.  Discussed plan of care with her daughter. Disposition- hopefully home in am.      Kalena Mander 09/29/2011 11:48 AM Pager: [phone].

## 2011-09-30 LAB — GLUCOSE, CAPILLARY
Glucose-Capillary: 117 mg/dL — ABNORMAL HIGH (ref 70–99)
Glucose-Capillary: 165 mg/dL — ABNORMAL HIGH (ref 70–99)

## 2011-09-30 MED ORDER — GUAIFENESIN ER 600 MG PO TB12: 1200.0000 mg | ORAL_TABLET | Freq: Two times a day (BID) | ORAL | Status: AC

## 2011-09-30 MED ORDER — DOXYCYCLINE HYCLATE 100 MG PO TABS: 100.0000 mg | ORAL_TABLET | Freq: Two times a day (BID) | ORAL | Status: DC

## 2011-09-30 NOTE — Discharge Summary (Signed)
DISCHARGE SUMMARY  [name]  MR#: [id number]  DOB:1931-05-03  Date of Admission: 09/19/2011 Date of Discharge: 09/30/2011  Attending Physician:Charlet Harr  Patient's WUJ:WJXBJ,YNWGN, MD  Consults:  Dr Nicki Guadal[place], cardiology.  Discharge Diagnoses: Present on Admission:  .Hyponatremia .CHF exacerbation-systolic EF 40-45% . HCAP . Ecoli uti .Toxic metabolic encephalopathy. .DM2- diet controlled, hb a1c 6.1. .Morbid obesity, BMI greater than 40. Marland KitchenHTN-essential. .gout.   Current Discharge Medication List    START taking these medications   Details  doxycycline (VIBRA-TABS) 100 MG tablet Take 1 tablet (100 mg total) by mouth 2 (two) times daily. Qty: 10 tablet, Refills: 0    guaiFENesin (MUCINEX) 600 MG 12 hr tablet Take 2 tablets (1,200 mg total) by mouth 2 (two) times daily. Qty: 10 tablet, Refills: 0      CONTINUE these medications which have NOT CHANGED   Details  acetaminophen (TYLENOL) 500 MG tablet Take 1,000 mg by mouth every 6 (six) hours as needed. pain     allopurinol (ZYLOPRIM) 100 MG tablet Take 100 mg by mouth daily.     Associated Diagnoses: Anemia    chlorpheniramine-HYDROcodone (TUSSIONEX) 10-8 MG/5ML LQCR Take 5 mLs by mouth every 12 (twelve) hours as needed. Qty: 140 mL, Refills: 0    clopidogrel (PLAVIX) 75 MG tablet Take 75 mg by mouth daily.     Associated Diagnoses: Anemia    colchicine 0.6 MG tablet Take 0.6 mg by mouth 2 (two) times daily as needed.     Associated Diagnoses: Anemia    esomeprazole (NEXIUM) 40 MG capsule Take 40 mg by mouth daily before breakfast.     Associated Diagnoses: Anemia    ezetimibe-simvastatin (VYTORIN) 10-20 MG per tablet Take 1 tablet by mouth at bedtime.      folic acid (FOLVITE) 1 MG tablet Take 1 mg by mouth daily.     Associated Diagnoses: Anemia    isosorbide mononitrate (IMDUR) 30 MG 24 hr tablet Take 30 mg by mouth daily.     Associated Diagnoses: Anemia    lisinopril (PRINIVIL,ZESTRIL)  2.5 MG tablet Take 1 tablet (2.5 mg total) by mouth daily. Qty: 30 tablet, Refills: 0    metoprolol (LOPRESSOR) 50 MG tablet Take 1 tablet (50 mg total) by mouth daily. Qty: 30 tablet, Refills: 0   Associated Diagnoses: Anemia    polysaccharide iron (NIFEREX) 150 MG CAPS capsule Take 150 mg by mouth 2 (two) times daily.     Associated Diagnoses: Anemia    pregabalin (LYRICA) 75 MG capsule Take 75 mg by mouth daily.     Associated Diagnoses: Anemia    primidone (MYSOLINE) 50 MG tablet Take 50 mg by mouth daily.     Associated Diagnoses: Anemia    senna (SENOKOT) 8.6 MG tablet Take 1 tablet by mouth 2 (two) times daily.     Associated Diagnoses: Anemia    torsemide (DEMADEX) 20 MG tablet Take 20 mg by mouth daily.     Associated Diagnoses: Anemia      STOP taking these medications     levofloxacin (LEVAQUIN) 500 MG tablet          Hospital Course: Mrs Haecker was admitted on 09/19/11 with lethargy, found to have ESBL uti and recurrent pneumonia. She was also delirious. She also had worsening edema, hence concern for systolic chf decompensation, ef 40-45% in recent admission. She also had hyponatremia thought to be due to fluid overload. Notedly, she has been baseline hyponatremia, which has remained steady around 129. Patient required some  ivf as she got over diuresed. BUN/CR- 29/0.93. patient was initially on iv levaquin/vanc, later switched to Azactam after urine cultures grew esbl. She improved. Needs a few more days of doxycycline(PCN allergy). Family taking patient home. She seems close to baseline. She should follow with Dr Redmond School in the next week.  Day of Discharge BP 121/75  Pulse 65  Temp(Src) 98 F (36.7 C) (Oral)  Resp 18  Ht 5\' 3"  (1.6 m)  Wt 91.5 kg (201 lb 11.5 oz)  BMI 35.73 kg/m2  SpO2 98%  Physical Exam: Not in distress. Quite excited to be finally going home.  Results for orders placed during the hospital encounter of 09/19/11 (from the past 24 hour(s))    GLUCOSE, CAPILLARY     Status: Abnormal   Collection Time   09/29/11  4:53 PM      Component Value Range   Glucose-Capillary 124 (*) 70 - 99 (mg/dL)   Comment 1 Notify RN    GLUCOSE, CAPILLARY     Status: Abnormal   Collection Time   09/29/11  9:50 PM      Component Value Range   Glucose-Capillary 203 (*) 70 - 99 (mg/dL)   Comment 1 Notify RN    GLUCOSE, CAPILLARY     Status: Abnormal   Collection Time   09/30/11  7:28 AM      Component Value Range   Glucose-Capillary 117 (*) 70 - 99 (mg/dL)  GLUCOSE, CAPILLARY     Status: Abnormal   Collection Time   09/30/11 11:25 AM      Component Value Range   Glucose-Capillary 165 (*) 70 - 99 (mg/dL)   Comment 1 Notify RN      Disposition: home to care of family, with HHS.   Follow-up Appts: Discharge Orders    Future Appointments: Provider: Department: Dept Phone: Center:   12/03/2011 2:30 PM Jarrett Soho H[facility]erson [facility] [phone] None   12/03/2011 3:00 PM Lucile Shutters, MD [facility] [phone] None     Future Orders Please Complete By Expires   Diet - low sodium heart healthy      Increase activity slowly         Follow-up with Florentina Jenny, MD, in 1 week.   Tests Needing Follow-up: BMP.  Time spent in discharge (includes decision making & examination of pt): 40 minutes.   Signed: Cynthis Purington 09/30/2011, 3:26 PM

## 2011-09-30 NOTE — Progress Notes (Signed)
Cm spoke with pt's daughter concerning d/c planning. Per family choice [facility] to continue providing HHRn for pt upon discharge. New Md order for [facility] to provide HHPT. CM notified [facility] of pt's d/c home and new referral. Appropriate documentation faxed to [phone]. MD order for home oxygen. [facility] to provide DME.  [name] [phone]

## 2011-09-30 NOTE — Progress Notes (Signed)
EMS here to transport patient home. Discharge instructions given to family. Home health and equipment arranged. No questions or concerns at this time.

## 2011-10-04 ENCOUNTER — Emergency Department (HOSPITAL_COMMUNITY): Payer: Medicare Other

## 2011-10-04 ENCOUNTER — Emergency Department (HOSPITAL_COMMUNITY)
Admission: EM | Admit: 2011-10-04 | Discharge: 2011-10-04 | Disposition: A | Discharge status: Home / Self Care | Payer: Medicare Other | Attending: Emergency Medicine | Admitting: Emergency Medicine

## 2011-10-04 ENCOUNTER — Encounter (HOSPITAL_COMMUNITY): Payer: Self-pay

## 2011-10-04 ENCOUNTER — Other Ambulatory Visit: Payer: Self-pay

## 2011-10-04 DIAGNOSIS — E119 Type 2 diabetes mellitus without complications: Secondary | ICD-10-CM | POA: Insufficient documentation

## 2011-10-04 DIAGNOSIS — R221 Localized swelling, mass and lump, neck: Secondary | ICD-10-CM | POA: Insufficient documentation

## 2011-10-04 DIAGNOSIS — I69998 Other sequelae following unspecified cerebrovascular disease: Secondary | ICD-10-CM | POA: Insufficient documentation

## 2011-10-04 DIAGNOSIS — I10 Essential (primary) hypertension: Secondary | ICD-10-CM | POA: Insufficient documentation

## 2011-10-04 DIAGNOSIS — R220 Localized swelling, mass and lump, head: Secondary | ICD-10-CM | POA: Insufficient documentation

## 2011-10-04 DIAGNOSIS — E860 Dehydration: Secondary | ICD-10-CM

## 2011-10-04 DIAGNOSIS — R1310 Dysphagia, unspecified: Secondary | ICD-10-CM | POA: Insufficient documentation

## 2011-10-04 DIAGNOSIS — Z79899 Other long term (current) drug therapy: Secondary | ICD-10-CM | POA: Insufficient documentation

## 2011-10-04 DIAGNOSIS — R5381 Other malaise: Secondary | ICD-10-CM | POA: Insufficient documentation

## 2011-10-04 DIAGNOSIS — B370 Candidal stomatitis: Secondary | ICD-10-CM

## 2011-10-04 LAB — APTT: aPTT: 45 seconds — ABNORMAL HIGH (ref 24–37)

## 2011-10-04 LAB — URINALYSIS, ROUTINE W REFLEX MICROSCOPIC
Glucose, UA: NEGATIVE mg/dL
Ketones, ur: NEGATIVE mg/dL
Leukocytes, UA: NEGATIVE
Specific Gravity, Urine: 1.009 (ref 1.005–1.030)
pH: 6 (ref 5.0–8.0)

## 2011-10-04 LAB — COMPREHENSIVE METABOLIC PANEL
AST: 23 U/L (ref 0–37)
Albumin: 2.9 g/dL — ABNORMAL LOW (ref 3.5–5.2)
Chloride: 92 mEq/L — ABNORMAL LOW (ref 96–112)
Creatinine, Ser: 1.08 mg/dL (ref 0.50–1.10)
Potassium: 4.2 mEq/L (ref 3.5–5.1)
Total Bilirubin: 0.2 mg/dL — ABNORMAL LOW (ref 0.3–1.2)
Total Protein: 6.8 g/dL (ref 6.0–8.3)

## 2011-10-04 LAB — PROTIME-INR
INR: 0.98 (ref 0.00–1.49)
Prothrombin Time: 13.2 seconds (ref 11.6–15.2)

## 2011-10-04 LAB — GLUCOSE, CAPILLARY: Glucose-Capillary: 123 mg/dL — ABNORMAL HIGH (ref 70–99)

## 2011-10-04 LAB — CBC
Platelets: 219 10*3/uL (ref 150–400)
RDW: 19.4 % — ABNORMAL HIGH (ref 11.5–15.5)
WBC: 8.4 10*3/uL (ref 4.0–10.5)

## 2011-10-04 LAB — DIFFERENTIAL
Basophils Absolute: 0 10*3/uL (ref 0.0–0.1)
Eosinophils Absolute: 0.3 10*3/uL (ref 0.0–0.7)
Lymphocytes Relative: 19 % (ref 12–46)
Monocytes Relative: 8 % (ref 3–12)

## 2011-10-04 MED ORDER — SODIUM CHLORIDE 0.9 % IV BOLUS (SEPSIS)
500.0000 mL | Freq: Once | INTRAVENOUS | Status: AC
  Administered 2011-10-04: 500 mL via INTRAVENOUS

## 2011-10-04 MED ORDER — NYSTATIN 100000 UNIT/ML MT SUSP: 500000.0000 [IU] | Freq: Four times a day (QID) | OROMUCOSAL | Status: AC

## 2011-10-04 MED ORDER — SODIUM CHLORIDE 0.9 % IV BOLUS (SEPSIS): 500.0000 mL | Freq: Once | INTRAVENOUS | Status: DC

## 2011-10-04 NOTE — ED Notes (Signed)
Per EMS- Pt lives at home with family. Pt.'s family reported to EMS that they noticed at 0800 this AM that the pt. Had tongue swelling, and speech was different. Then the family noticed that pt. Was unable to swallow. Pt. Has a history of CVA and CABG. Pt. Is non [place] speaking, but the daughter interprets.

## 2011-10-04 NOTE — ED Notes (Signed)
WUJ:WJXBJ<YN> Expected date:10/04/11<BR> Expected time: 2:19 PM<BR> Means of arrival:Ambulance<BR> Comments:<BR> FTT

## 2011-10-04 NOTE — ED Provider Notes (Signed)
History     CSN: [id number]  Arrival date & time 10/04/11  1429   First MD Initiated Contact with Patient 10/04/11 1506      Chief Complaint  Patient presents with  . Oral Swelling  . Dysphagia    (Consider location/radiation/quality/duration/timing/severity/associated sxs/prior treatment) HPI patient presents to the emergency room with a complaint of a headache the tongue. The patient lives at home with her daughters.  she has had a stroke in the past and at baseline is unable to ambulate. Daughters noted that her tongue was thick this morning. Her speech seemed to be somewhat altered as well. The patient had difficulty swallowing as well. The daughters think that she's had some white exudate on her tongue. She has been on antibiotics recently. She is better now. She is speaking normally per her daughters. She has not had any vomiting or diarrhea. She has had a bit of a cough. She has not had any fevers or chills history was provided by the patient's daughters . Past Medical History  Diagnosis Date  . Diabetes mellitus   . Hypertension   . Stroke Right Side Weakness    Past Surgical History  Procedure Date  . Cardiac surgery   . Coronary artery bypass graft     History reviewed. No pertinent family history.  History  Substance Use Topics  . Smoking status: Never Smoker   . Smokeless tobacco: Never Used  . Alcohol Use: No    OB History    Grav Para Term Preterm Abortions TAB SAB Ect Mult Living                  Review of Systems  All other systems reviewed and are negative.    Allergies  Penicillins  Home Medications   Current Outpatient Rx  Name Route Sig Dispense Refill  . ACETAMINOPHEN 500 MG PO TABS Oral Take 1,000 mg by mouth every 6 (six) hours as needed. pain     . ALLOPURINOL 100 MG PO TABS Oral Take 100 mg by mouth daily.      Marland Kitchen HYDROCOD POLST-CPM POLST ER 10-8 MG/5ML PO LQCR Oral Take 5 mLs by mouth every 12 (twelve) hours as needed. 140 mL 0  .  CLOPIDOGREL BISULFATE 75 MG PO TABS Oral Take 75 mg by mouth daily.      . COLCHICINE 0.6 MG PO TABS Oral Take 0.6 mg by mouth 2 (two) times daily as needed.      Marland Kitchen DOXYCYCLINE HYCLATE 100 MG PO TABS Oral Take 1 tablet (100 mg total) by mouth 2 (two) times daily. 10 tablet 0  . ESOMEPRAZOLE MAGNESIUM 40 MG PO CPDR Oral Take 40 mg by mouth daily before breakfast.      . EZETIMIBE-SIMVASTATIN 10-20 MG PO TABS Oral Take 1 tablet by mouth at bedtime.      Marland Kitchen FOLIC ACID 1 MG PO TABS Oral Take 1 mg by mouth daily.      . GUAIFENESIN ER 600 MG PO TB12 Oral Take 2 tablets (1,200 mg total) by mouth 2 (two) times daily. 10 tablet 0  . ISOSORBIDE MONONITRATE ER 30 MG PO TB24 Oral Take 30 mg by mouth daily.      Marland Kitchen LISINOPRIL 2.5 MG PO TABS Oral Take 1 tablet (2.5 mg total) by mouth daily. 30 tablet 0  . METOPROLOL TARTRATE 50 MG PO TABS Oral Take 1 tablet (50 mg total) by mouth daily. 30 tablet 0  . POLYSACCHARIDE IRON 150 MG PO  CAPS Oral Take 150 mg by mouth 2 (two) times daily.      Marland Kitchen PREGABALIN 75 MG PO CAPS Oral Take 75 mg by mouth daily.      Marland Kitchen PRIMIDONE 50 MG PO TABS Oral Take 50 mg by mouth daily.      . SENNOSIDES 8.6 MG PO TABS Oral Take 1 tablet by mouth 2 (two) times daily.      . TORSEMIDE 20 MG PO TABS Oral Take 20 mg by mouth daily.        BP 130/36  Pulse 67  Temp(Src) 98.4 F (36.9 C) (Oral)  Resp 20  SpO2 100%  Physical Exam  Nursing note and vitals reviewed. Constitutional: No distress.  HENT:  Head: Normocephalic and atraumatic.  Right Ear: External ear normal.  Left Ear: External ear normal.       Mucous membranes are dry, no edema, white exudate on tongue   Eyes: Conjunctivae are normal. Right eye exhibits no discharge. Left eye exhibits no discharge. No scleral icterus.  Neck: Neck supple. No tracheal deviation present.  Cardiovascular: Normal rate, regular rhythm and intact distal pulses.   Pulmonary/Chest: Effort normal and breath sounds normal. No stridor. No  respiratory distress. She has no wheezes. She has no rales.  Abdominal: Soft. Bowel sounds are normal. She exhibits no distension. There is no tenderness. There is no rebound and no guarding.  Musculoskeletal: She exhibits no edema and no tenderness.  Neurological: She is alert. No sensory deficit. Cranial nerve deficit:  no gross defecits noted. She exhibits normal muscle tone. She displays no seizure activity. Coordination normal.       The patient is alert, she is able to move both upper and lower extremities, she is weak however and is unable to her legs off the bed, no facial droop, tongue is midline  Skin: Skin is warm and dry. No rash noted.  Psychiatric: She has a normal mood and affect.    ED Course  Procedures (including critical care time)  Date: 10/04/2011  Rate: 62  Rhythm: normal sinus rhythm  QRS Axis: normal  Intervals: normal  ST/T Wave abnormalities: normal  Conduction Disutrbances:none  Narrative Interpretation:   Old EKG Reviewed: unchanged   Labs Reviewed  APTT - Abnormal; Notable for the following:    aPTT 45 (*)    All other components within normal limits  CBC - Abnormal; Notable for the following:    RBC 3.48 (*)    Hemoglobin 9.1 (*)    HCT 29.0 (*)    RDW 19.4 (*)    All other components within normal limits  COMPREHENSIVE METABOLIC PANEL - Abnormal; Notable for the following:    Sodium 131 (*)    Chloride 92 (*)    Glucose, Bld 136 (*)    BUN 54 (*)    Albumin 2.9 (*)    Total Bilirubin 0.2 (*)    GFR calc non Af Amer 48 (*)    GFR calc Af Amer 55 (*)    All other components within normal limits  GLUCOSE, CAPILLARY - Abnormal; Notable for the following:    Glucose-Capillary 123 (*)    All other components within normal limits  PROTIME-INR  DIFFERENTIAL  TROPONIN I  URINALYSIS, ROUTINE W REFLEX MICROSCOPIC  POCT CBG MONITORING   Ct Head Wo Contrast  10/04/2011  *RADIOLOGY REPORT*  Clinical Data: Dysphagia and weakness.  CT HEAD WITHOUT  CONTRAST  Technique:  Contiguous axial images were obtained from the base  of the skull through the vertex without contrast.  Comparison: Head CT 06/08/2011.  Findings: Stable age related cerebral atrophy, ventriculomegaly and periventricular white matter disease.  Remote left-sided white matter infarct and mild ex vacuo dilatation of the left lateral ventricle.  No extra-axial fluid collections are identified.  No CT findings for acute hemispheric infarction or intracranial hemorrhage.  No mass lesions.  The brainstem and cerebellum are grossly normal and stable.  Vascular calcifications are stable.  The bony structures are intact.  Paranasal sinuses and mastoid air cells are clear.  IMPRESSION:  1.  Stable age related cerebral atrophy, ventriculomegaly and periventricular white matter disease. 2.  Remote left basal ganglia infarct. 3.  No acute intracranial findings.  Original Report Authenticated By: P. Loralie Champagne, M.D.     MDM    Patient's lab results suggest mild dehydration. She does appear anemic but that is stable. Her CT scan does not show any signs of acute stroke or hemorrhage. Her EKG shows a normal sinus rhythm. Family states that at this time she appears to be back to her normal self. She has not been drinking a lot of fluids as they've been monitoring her fluid intake as she previously had CHF. I suspect that she might be mildly dehydrated and that she could have a component of thrush with her recent antibiotics. Family and patient are comfortable going home at this time. I will prescribe nystatin liquid and I have encouraged him to followup with her doctor in the next week to be rechecked. sHe should return for any worsening symptoms or other concerns.      Celene Kras, MD 10/04/11 [phone]

## 2011-10-18 ENCOUNTER — Ambulatory Visit
Admission: RE | Admit: 2011-10-18 | Discharge: 2011-10-18 | Disposition: A | Discharge status: Home / Self Care | Payer: Medicare Other | Source: Ambulatory Visit | Attending: Cardiovascular Disease | Admitting: Cardiovascular Disease

## 2011-10-18 ENCOUNTER — Other Ambulatory Visit: Payer: Self-pay | Admitting: Cardiovascular Disease

## 2011-10-18 DIAGNOSIS — J189 Pneumonia, unspecified organism: Secondary | ICD-10-CM

## 2011-12-03 ENCOUNTER — Telehealth: Payer: Self-pay | Admitting: Oncology

## 2011-12-03 ENCOUNTER — Other Ambulatory Visit (HOSPITAL_BASED_OUTPATIENT_CLINIC_OR_DEPARTMENT_OTHER): Payer: Medicare Other | Admitting: Lab

## 2011-12-03 ENCOUNTER — Ambulatory Visit (HOSPITAL_BASED_OUTPATIENT_CLINIC_OR_DEPARTMENT_OTHER): Payer: Medicare Other | Admitting: Oncology

## 2011-12-03 VITALS — BP 127/69 | HR 73 | Temp 98.8°F

## 2011-12-03 DIAGNOSIS — D649 Anemia, unspecified: Secondary | ICD-10-CM

## 2011-12-03 DIAGNOSIS — D509 Iron deficiency anemia, unspecified: Secondary | ICD-10-CM

## 2011-12-03 LAB — CBC WITH DIFFERENTIAL/PLATELET
Basophils Absolute: 0 10*3/uL (ref 0.0–0.1)
EOS%: 2.4 % (ref 0.0–7.0)
HCT: 29.6 % — ABNORMAL LOW (ref 34.8–46.6)
HGB: 9.5 g/dL — ABNORMAL LOW (ref 11.6–15.9)
LYMPH%: 20 % (ref 14.0–49.7)
MCH: 27.3 pg (ref 25.1–34.0)
MCHC: 32 g/dL (ref 31.5–36.0)
NEUT%: 69.7 % (ref 38.4–76.8)
Platelets: 142 10*3/uL — ABNORMAL LOW (ref 145–400)
lymph#: 1 10*3/uL (ref 0.9–3.3)

## 2011-12-03 NOTE — Progress Notes (Signed)
OFFICE PROGRESS NOTE   INTERVAL HISTORY:   She was admitted in December with an exacerbation of congestive heart failure. She is now maintained on home oxygen. She has no specific complaints. The daughter reports the patient remains confined to a wheelchair and bed.  Objective:  Vital signs in last 24 hours:  Blood pressure 127/69, pulse 73, temperature 98.8 F (37.1 C), temperature source Oral.    HEENT: Neck without mass Lymphatics: No cervical, supraclavicular, axillary, or inguinal nodes Resp: Decreased breath sounds with end inspiratory rhonchi at the left base. No respiratory distress. Cardio: Regular rate and rhythm GI: No hepatosplenomegaly Vascular: No leg edema   Lab Results:  Lab Results  Component Value Date   WBC 4.9 12/03/2011   HGB 9.5* 12/03/2011   HCT 29.6* 12/03/2011   MCV 85.3 12/03/2011   PLT 142* 12/03/2011   ANC 3.4    Medications: I have reviewed the patient's current medications.  Assessment/Plan: 1. History of a microcytic anemia.  The MCV remains in the normal range.  The hemoglobin is stable today. 2. History of coronary artery disease. 3. "Gout." 4. "Giant" platelets on the blood smear from 01/29/2011. 5. 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  She is unable to ambulate. 6. History of a Zoster rash at the left lower back and chest. 7. Admission with pneumonia and a CHF exacerbation in December of 2012.   Disposition:  She appears stable. She has a chronic anemia. We obtained a serum protein electrophoresis and serum free light chain analysis today. The anemia could be related to chronic GI blood loss, myelodysplasia, or "chronic disease ". There is no clinical finding to suggest an underlying hematopoietic malignancy. I discussed the indication for a bone marrow biopsy with the patient's daughters. She appears asymptomatic from the anemia and given her overall poor performance status I do not recommend a bone marrow biopsy at present. She will  return for an office visit and CBC in 3 months.   Lucile Shutters, MD  12/03/2011  6:37 PM

## 2011-12-03 NOTE — Telephone Encounter (Signed)
gv pt/dtr appt schedule for June.  °

## 2011-12-05 LAB — PROTEIN ELECTROPHORESIS, SERUM
Albumin ELP: 53.7 % — ABNORMAL LOW (ref 55.8–66.1)
Beta 2: 5.3 % (ref 3.2–6.5)
Gamma Globulin: 18 % (ref 11.1–18.8)

## 2011-12-05 LAB — BASIC METABOLIC PANEL
BUN: 11 mg/dL (ref 6–23)
CO2: 31 mEq/L (ref 19–32)
Calcium: 7.8 mg/dL — ABNORMAL LOW (ref 8.4–10.5)
Chloride: 99 mEq/L (ref 96–112)
Creatinine, Ser: 0.68 mg/dL (ref 0.50–1.10)

## 2011-12-05 LAB — KAPPA/LAMBDA LIGHT CHAINS: Kappa free light chain: 3.88 mg/dL — ABNORMAL HIGH (ref 0.33–1.94)

## 2011-12-11 ENCOUNTER — Other Ambulatory Visit: Payer: Self-pay | Admitting: *Deleted

## 2011-12-11 DIAGNOSIS — D696 Thrombocytopenia, unspecified: Secondary | ICD-10-CM

## 2012-03-04 ENCOUNTER — Ambulatory Visit: Payer: Medicare Other | Admitting: Nurse Practitioner

## 2012-03-04 ENCOUNTER — Other Ambulatory Visit: Payer: Medicare Other | Admitting: Lab

## 2012-03-06 ENCOUNTER — Other Ambulatory Visit (HOSPITAL_BASED_OUTPATIENT_CLINIC_OR_DEPARTMENT_OTHER): Payer: Medicare Other | Admitting: Lab

## 2012-03-06 ENCOUNTER — Ambulatory Visit (HOSPITAL_BASED_OUTPATIENT_CLINIC_OR_DEPARTMENT_OTHER): Payer: Medicare Other | Admitting: Nurse Practitioner

## 2012-03-06 ENCOUNTER — Telehealth: Payer: Self-pay | Admitting: Oncology

## 2012-03-06 VITALS — BP 119/73 | HR 66 | Temp 98.4°F

## 2012-03-06 DIAGNOSIS — D696 Thrombocytopenia, unspecified: Secondary | ICD-10-CM

## 2012-03-06 DIAGNOSIS — I2510 Atherosclerotic heart disease of native coronary artery without angina pectoris: Secondary | ICD-10-CM

## 2012-03-06 DIAGNOSIS — D649 Anemia, unspecified: Secondary | ICD-10-CM

## 2012-03-06 DIAGNOSIS — D6489 Other specified anemias: Secondary | ICD-10-CM

## 2012-03-06 DIAGNOSIS — M109 Gout, unspecified: Secondary | ICD-10-CM

## 2012-03-06 LAB — CBC & DIFF AND RETIC
BASO%: 0.2 % (ref 0.0–2.0)
EOS%: 2.8 % (ref 0.0–7.0)
HCT: 31.5 % — ABNORMAL LOW (ref 34.8–46.6)
Immature Retic Fract: 9.6 % (ref 1.60–10.00)
LYMPH%: 19.6 % (ref 14.0–49.7)
MCH: 27.2 pg (ref 25.1–34.0)
MCHC: 30.8 g/dL — ABNORMAL LOW (ref 31.5–36.0)
MONO%: 8.6 % (ref 0.0–14.0)
NEUT%: 68.8 % (ref 38.4–76.8)
Platelets: 141 10*3/uL — ABNORMAL LOW (ref 145–400)
RBC: 3.56 10*6/uL — ABNORMAL LOW (ref 3.70–5.45)

## 2012-03-06 NOTE — Telephone Encounter (Signed)
Gv pt aptp for dec2013

## 2012-03-06 NOTE — Progress Notes (Signed)
OFFICE PROGRESS NOTE  Interval history:  [name] returns as scheduled. She is accompanied by her daughter who serves as the interpreter. She has had no interim illnesses or infections. Her daughter feels that she is overall doing well. She remains non-ambulatory. They have a "lift" at home to move her. She has a good appetite.   Objective: Blood pressure 119/73, pulse 66, temperature 98.4 F (36.9 C), temperature source Oral.  No palpable cervical or supra-clavicular lymph nodes. Rhonchi at both lung bases. No respiratory distress. Cardiac rhythm. Abdomen is soft and nontender. Trace edema at the lower legs.  Lab Results: Lab Results  Component Value Date   WBC 6.4 03/06/2012   HGB 9.7* 03/06/2012   HCT 31.5* 03/06/2012   MCV 88.5 03/06/2012   PLT 141* 03/06/2012    Chemistry:    Chemistry      Component Value Date/Time   NA 138 12/03/2011 1420   K 3.3* 12/03/2011 1420   CL 99 12/03/2011 1420   CO2 31 12/03/2011 1420   BUN 11 12/03/2011 1420   CREATININE 0.68 12/03/2011 1420      Component Value Date/Time   CALCIUM 7.8* 12/03/2011 1420   ALKPHOS 86 10/04/2011 1545   AST 23 10/04/2011 1545   ALT 19 10/04/2011 1545   BILITOT 0.2* 10/04/2011 1545       Studies/Results: No results found.  Medications: I have reviewed the patient's current medications.  Assessment/Plan:  1. History of a microcytic anemia. The MCV remains in the normal range. The hemoglobin is stable today. 2. History of coronary artery disease. 3. "Gout." 4. "Giant" platelets on the blood smear from 01/29/2011. 5. 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 She is unable to ambulate. 6. History of a Zoster rash at the left lower back and chest. 7. Admission with pneumonia and a CHF exacerbation in December of 2012.  Disposition-[name] has a stable chronic anemia. Serum protein electrophoresis 12/03/2011 was negative for an M spike. We will followup on the serum immunofixation from today. She remains  asymptomatic from the anemia. We will continue to follow on an observation approach. She will return for a CBC and followup visit in 6 months. Her family will contact the office in the interim with any problems.  Plan reviewed with Dr. Truett Perna.  Lonna Cobb  ANP/GNP-BC

## 2012-03-10 LAB — IMMUNOFIXATION ELECTROPHORESIS
IgA: 275 mg/dL (ref 69–380)
IgM, Serum: 232 mg/dL (ref 52–322)

## 2012-07-23 ENCOUNTER — Other Ambulatory Visit (HOSPITAL_COMMUNITY): Payer: Self-pay | Admitting: Cardiovascular Disease

## 2012-07-23 DIAGNOSIS — R079 Chest pain, unspecified: Secondary | ICD-10-CM

## 2012-07-30 ENCOUNTER — Other Ambulatory Visit (HOSPITAL_COMMUNITY): Payer: Medicare Other

## 2012-07-30 ENCOUNTER — Encounter (HOSPITAL_COMMUNITY): Payer: Medicare Other

## 2012-08-08 ENCOUNTER — Encounter (HOSPITAL_COMMUNITY)
Admission: RE | Admit: 2012-08-08 | Discharge: 2012-08-08 | Disposition: A | Discharge status: Home / Self Care | Payer: Medicare Other | Source: Ambulatory Visit | Attending: Cardiovascular Disease | Admitting: Cardiovascular Disease

## 2012-08-08 ENCOUNTER — Other Ambulatory Visit: Payer: Self-pay

## 2012-08-08 DIAGNOSIS — R079 Chest pain, unspecified: Secondary | ICD-10-CM | POA: Insufficient documentation

## 2012-08-08 MED ORDER — REGADENOSON 0.4 MG/5ML IV SOLN
0.4000 mg | Freq: Once | INTRAVENOUS | Status: AC
  Administered 2012-08-08: 0.4 mg via INTRAVENOUS

## 2012-08-08 MED ORDER — REGADENOSON 0.4 MG/5ML IV SOLN
INTRAVENOUS | Status: AC
  Filled 2012-08-08: qty 5

## 2012-08-08 MED ORDER — TECHNETIUM TC 99M SESTAMIBI GENERIC - CARDIOLITE
30.0000 | Freq: Once | INTRAVENOUS | Status: AC | PRN
  Administered 2012-08-08: 30 via INTRAVENOUS

## 2012-08-08 MED ORDER — TECHNETIUM TC 99M SESTAMIBI GENERIC - CARDIOLITE
10.0000 | Freq: Once | INTRAVENOUS | Status: AC | PRN
  Administered 2012-08-08: 10 via INTRAVENOUS

## 2012-09-01 ENCOUNTER — Telehealth: Payer: Self-pay | Admitting: Oncology

## 2012-09-01 NOTE — Telephone Encounter (Signed)
Change to Lonna Cobb schedule per Dr. Bard Herbert.

## 2012-09-04 ENCOUNTER — Other Ambulatory Visit (HOSPITAL_BASED_OUTPATIENT_CLINIC_OR_DEPARTMENT_OTHER): Payer: Medicare Other

## 2012-09-04 ENCOUNTER — Ambulatory Visit (HOSPITAL_BASED_OUTPATIENT_CLINIC_OR_DEPARTMENT_OTHER): Payer: Medicare Other | Admitting: Nurse Practitioner

## 2012-09-04 ENCOUNTER — Telehealth: Payer: Self-pay | Admitting: Oncology

## 2012-09-04 VITALS — BP 115/60 | HR 73 | Temp 97.6°F | Resp 20 | Ht 63.0 in | Wt 204.0 lb

## 2012-09-04 DIAGNOSIS — D649 Anemia, unspecified: Secondary | ICD-10-CM

## 2012-09-04 DIAGNOSIS — D509 Iron deficiency anemia, unspecified: Secondary | ICD-10-CM

## 2012-09-04 LAB — CBC & DIFF AND RETIC
Eosinophils Absolute: 0.1 10*3/uL (ref 0.0–0.5)
MCV: 88.8 fL (ref 79.5–101.0)
MONO%: 7.5 % (ref 0.0–14.0)
NEUT#: 5.3 10*3/uL (ref 1.5–6.5)
RBC: 3.29 10*6/uL — ABNORMAL LOW (ref 3.70–5.45)
RDW: 18 % — ABNORMAL HIGH (ref 11.2–14.5)
Retic %: 4.07 % — ABNORMAL HIGH (ref 0.70–2.10)
WBC: 7.1 10*3/uL (ref 3.9–10.3)
lymph#: 1.1 10*3/uL (ref 0.9–3.3)
nRBC: 0 % (ref 0–0)

## 2012-09-04 NOTE — Progress Notes (Signed)
OFFICE PROGRESS NOTE  Interval history:  [name] returns as scheduled. She is accompanied by her daughter who serves as the interpreter. Her daughter reports that overall she is doing well. No interim illnesses or infections. She is not ambulatory. They have a lift at home. She is oxygen dependent. Her daughter reports the oxygen saturation level drops into the 60s or 70s if the oxygen is off for more than one hour. She notes that [name] becomes confused when she is not wearing oxygen.   Objective: Blood pressure 115/60, pulse 73, temperature 97.6 F (36.4 C), temperature source Oral, resp. rate 20, height 5\' 3"  (1.6 m), weight 204 lb (92.534 kg).  She was examined in the wheelchair. Oropharynx is without thrush or ulceration. Lungs are clear. Regular cardiac rhythm. Abdomen is soft, obese. Trace lower leg edema bilaterally. Moves all extremities.  Lab Results: Lab Results  Component Value Date   WBC 7.1 09/04/2012   HGB 9.1* 09/04/2012   HCT 29.2* 09/04/2012   MCV 88.8 09/04/2012   PLT 159 09/04/2012    Chemistry:    Chemistry      Component Value Date/Time   NA 138 12/03/2011 1420   K 3.3* 12/03/2011 1420   CL 99 12/03/2011 1420   CO2 31 12/03/2011 1420   BUN 11 12/03/2011 1420   CREATININE 0.68 12/03/2011 1420      Component Value Date/Time   CALCIUM 7.8* 12/03/2011 1420   ALKPHOS 86 10/04/2011 1545   AST 23 10/04/2011 1545   ALT 19 10/04/2011 1545   BILITOT 0.2* 10/04/2011 1545       Studies/Results: Nm Myocar Multi W/spect W/wall Motion / Ef  08/08/2012  *RADIOLOGY REPORT*  Clinical Data:  78 year old with chest pain. Post CABG.  MYOCARDIAL IMAGING WITH SPECT (REST AND PHARMACOLOGIC-STRESS) GATED LEFT VENTRICULAR WALL MOTION STUDY LEFT VENTRICULAR EJECTION FRACTION  Technique:  Standard myocardial SPECT imaging was performed after resting intravenous injection of 10 mCi Tc-36m sestamibi. Subsequently, intravenous infusion of regadenoson was performed under the  supervision of the Cardiology staff.  At peak effect of the drug, 30 mCi Tc-44m sestamibi was injected intravenously and standard myocardial SPECT  imaging was performed.  Quantitative gated imaging was also performed to evaluate left ventricular wall motion, and estimate left ventricular ejection fraction.  Comparison:  Chest CT 09/26/2011  Findings: There is decreased uptake along the lateral inferior ventricular wall on the stress images.  Findings are suggestive for a reversible defect.  No other areas are suspicious for reversible or fixed defects.  Wall motion analysis demonstrates hypokinesia along the septal wall, particularly the inferior septal wall. Findings may be secondary to the previous CABG procedure.  Normal wall motion in the lateral and lateral inferior wall.  The end-diastolic volume is 56 ml and end-systolic volume is 20 ml. Calculated ejection fraction is 65%.  IMPRESSION: Reversible defect involving the lateral inferior wall.  Findings are concerning for pharmacologically induced ischemia in this area.  Calculated ejection fraction is 65%.  These results will be called to the ordering clinician or representative by the Radiologist Assistant, and communication documented in the PACS Dashboard.   Original Report Authenticated By: Richarda Overlie, M.D.     Medications: I have reviewed the patient's current medications.  Assessment/Plan:  1. History of a microcytic anemia. The MCV remains in the normal range. The hemoglobin is stable. 2. History of coronary artery disease. 3. "Gout." 4. "Giant" platelets on the blood smear from 01/29/2011. 5. 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 She is unable to ambulate. 6. History of a zoster rash at the left lower back and chest. 7. Admission with pneumonia and a CHF exacerbation in December of 2012.  Disposition-Ms. Swatek's hemoglobin remains stable. Dr. Truett Perna discussed with her daughter that the anemia is likely due to myelodysplasia versus  renal insufficiency. We discussed a bone marrow biopsy. Her daughter is not interested in this at present. She is most comfortable with continuing an observation approach with routine labs. She will return for a followup visit and CBC in 6 months. Her daughter will contact the office in the interim with any problems.  Plan reviewed with Dr. Truett Perna.  [name] ANP/GNP-BC

## 2012-09-04 NOTE — Telephone Encounter (Signed)
Gave patient appt for June 2014 lab and MD

## 2013-02-16 ENCOUNTER — Other Ambulatory Visit: Payer: Self-pay | Admitting: Cardiovascular Disease

## 2013-02-17 NOTE — Telephone Encounter (Signed)
Refills x 6 Nexium & Metoprolol

## 2013-03-03 ENCOUNTER — Ambulatory Visit (INDEPENDENT_AMBULATORY_CARE_PROVIDER_SITE_OTHER): Payer: Medicare Other | Admitting: Cardiovascular Disease

## 2013-03-03 ENCOUNTER — Encounter: Payer: Self-pay | Admitting: Cardiovascular Disease

## 2013-03-03 VITALS — BP 116/70 | HR 69 | Ht 60.0 in

## 2013-03-03 DIAGNOSIS — I10 Essential (primary) hypertension: Secondary | ICD-10-CM

## 2013-03-03 DIAGNOSIS — E785 Hyperlipidemia, unspecified: Secondary | ICD-10-CM

## 2013-03-03 DIAGNOSIS — R600 Localized edema: Secondary | ICD-10-CM

## 2013-03-03 DIAGNOSIS — R5381 Other malaise: Secondary | ICD-10-CM

## 2013-03-03 DIAGNOSIS — R5383 Other fatigue: Secondary | ICD-10-CM

## 2013-03-03 DIAGNOSIS — I2510 Atherosclerotic heart disease of native coronary artery without angina pectoris: Secondary | ICD-10-CM

## 2013-03-03 DIAGNOSIS — R609 Edema, unspecified: Secondary | ICD-10-CM

## 2013-03-03 MED ORDER — ISOSORBIDE MONONITRATE ER 30 MG PO TB24: ORAL_TABLET | ORAL | Status: DC

## 2013-03-03 NOTE — Patient Instructions (Signed)
Your physician recommends that you return for lab work .  Your physician recommends that you schedule a follow-up appointment in: 4 months.  Your physician has recommended you make the following change in your medication: Increase your isosorbide to 45 mg.

## 2013-03-05 ENCOUNTER — Telehealth: Payer: Self-pay | Admitting: Oncology

## 2013-03-05 ENCOUNTER — Other Ambulatory Visit (HOSPITAL_BASED_OUTPATIENT_CLINIC_OR_DEPARTMENT_OTHER): Payer: Medicare Other | Admitting: Lab

## 2013-03-05 ENCOUNTER — Ambulatory Visit (HOSPITAL_BASED_OUTPATIENT_CLINIC_OR_DEPARTMENT_OTHER): Payer: Medicare Other | Admitting: Oncology

## 2013-03-05 VITALS — BP 158/70 | HR 72 | Temp 98.7°F | Resp 20

## 2013-03-05 DIAGNOSIS — D649 Anemia, unspecified: Secondary | ICD-10-CM

## 2013-03-05 DIAGNOSIS — Z7401 Bed confinement status: Secondary | ICD-10-CM

## 2013-03-05 DIAGNOSIS — D509 Iron deficiency anemia, unspecified: Secondary | ICD-10-CM

## 2013-03-05 DIAGNOSIS — N390 Urinary tract infection, site not specified: Secondary | ICD-10-CM

## 2013-03-05 LAB — CBC & DIFF AND RETIC
BASO%: 0.2 % (ref 0.0–2.0)
Eosinophils Absolute: 0.1 10*3/uL (ref 0.0–0.5)
LYMPH%: 17.5 % (ref 14.0–49.7)
MCHC: 30.1 g/dL — ABNORMAL LOW (ref 31.5–36.0)
MONO#: 0.5 10*3/uL (ref 0.1–0.9)
MONO%: 7.5 % (ref 0.0–14.0)
NEUT#: 4.6 10*3/uL (ref 1.5–6.5)
Platelets: 156 10*3/uL (ref 145–400)
RBC: 3.64 10*6/uL — ABNORMAL LOW (ref 3.70–5.45)
RDW: 17.4 % — ABNORMAL HIGH (ref 11.2–14.5)
Retic %: 2.68 % — ABNORMAL HIGH (ref 0.70–2.10)
Retic Ct Abs: 97.55 10*3/uL — ABNORMAL HIGH (ref 33.70–90.70)
WBC: 6.2 10*3/uL (ref 3.9–10.3)

## 2013-03-05 NOTE — Progress Notes (Signed)
NT reports patient was not able to stand up for weight today.

## 2013-03-05 NOTE — Telephone Encounter (Signed)
gv adn printed appt sched and avs for pt °

## 2013-03-05 NOTE — Progress Notes (Signed)
   [facility]    OFFICE PROGRESS NOTE   INTERVAL HISTORY:   She returns as scheduled. Her daughters report she is still bedbound. Occasional back pain. No consistent pain. She has recurrent urinary tract infections. There is a right arm tremor. No bleeding.  Objective:  Vital signs in last 24 hours:  Blood pressure 158/70, pulse 72, temperature 98.7 F (37.1 C), temperature source Oral, resp. rate 20.    HEENT: Neck without mass Lymphatics: No cervical, supraclavicular, or axillary nodes Resp: Lungs clear bilaterally Cardio: Regular rate and rhythm GI: No hepatosplenomegaly, nontender Vascular: The right lower leg and foot are larger than left side (the patient's daughter's report the right leg has been edematous for greater than 10 years) Neuro: Follows commands, alert      Lab Results:  Lab Results  Component Value Date   WBC 6.2 03/05/2013   HGB 9.8* 03/05/2013   HCT 32.6* 03/05/2013   MCV 89.6 03/05/2013   PLT 156 03/05/2013   ANC 4.6  Reticulocytes-2.68% (97.55)     Medications: I have reviewed the patient's current medications.  Assessment/Plan: 1. History of a microcytic anemia. The MCV remains in the normal range. The hemoglobin is stable. 2. History of coronary artery disease. 3. "Gout." 4. "Giant" platelets on the blood smear from 01/29/2011. 5. 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 She is unable to ambulate. 6. History of a zoster rash at the left lower back and chest. 7. Admission with pneumonia and a CHF exacerbation in December of 2012.  Disposition:  She appears stable. The anemia could be related to myelodysplasia, renal insufficiency, or chronic disease. The daughter's do not wish to proceed with a diagnostic bone marrow biopsy. There is no clinical evidence of a malignancy. She does not appear symptomatic from the anemia. They would like to continue followup in the hematology clinic. She will return for an office visit and CBC in  6 months.   Thornton Papas, MD  03/05/2013  4:14 PM

## 2013-03-09 LAB — IMMUNOFIXATION ELECTROPHORESIS
IgA: 246 mg/dL (ref 69–380)
IgG (Immunoglobin G), Serum: 1040 mg/dL (ref 690–1700)
IgM, Serum: 189 mg/dL (ref 52–322)
Total Protein, Serum Electrophoresis: 6.2 g/dL (ref 6.0–8.3)

## 2013-03-17 ENCOUNTER — Telehealth: Payer: Self-pay | Admitting: Cardiovascular Disease

## 2013-03-17 NOTE — Telephone Encounter (Signed)
She had a sharp in her neck last night and today-it comes and goes-get relief with nitro-she concerned-wonder if it could be her heart?

## 2013-03-17 NOTE — Telephone Encounter (Signed)
Returned call and spoke w/ Angelyn Punt, pt's daughter.  Stated pt had a sharp pain in the front of her neck between her neck and her chest last night and this morning.  Stated she gave pt NTG and pt said she was okay.  Stated the pain came back after about an hour.  Pt given NTG about ago and denied CP or SOB.  Last OV note still in progress.  Isosorbide was increased at that visit (6.10.14) to 45mg  daily (30mg  tabs: take 1.5 tabs daily).  Asked daughter if pt has been taking that dose and stated she has been giving pt one tablet.  Informed pt should be taking 1.5 tabs daily and Dr. Tresa Endo will be notified for further instructions regarding sharp neck pains.  Message forwarded to Dr. Tresa Endo.    1) Intermittent sharp neck pains. Relieved w/ SL NTG x 2 (one last night and one today)  Please advise.

## 2013-03-19 ENCOUNTER — Encounter: Payer: Self-pay | Admitting: Cardiovascular Disease

## 2013-03-19 DIAGNOSIS — I2510 Atherosclerotic heart disease of native coronary artery without angina pectoris: Secondary | ICD-10-CM | POA: Insufficient documentation

## 2013-03-19 DIAGNOSIS — Z9861 Coronary angioplasty status: Secondary | ICD-10-CM | POA: Insufficient documentation

## 2013-03-19 DIAGNOSIS — R600 Localized edema: Secondary | ICD-10-CM | POA: Insufficient documentation

## 2013-03-19 NOTE — Progress Notes (Signed)
Patient ID: [name], female   DOB: Jan 28, 1931, 79 y.o.   MRN: [id number]     HPI: HOLLE SPRICK, is a 79 y.o. female who has known coronary artery disease and underwent CABG revascularization surgery in 2000. In January 2006 she was found to have subtotal occlusion of the vein graft to RCA and underwent stenting with resumption of normal flow. She is known distal occlusion of her PDA/PLA-like vessel with collaterals from left to right she has an atretic LIMA but pain vein graft to diagonal which fills the LAD. She has had issues with some intermittent episodes of chest pain for which she has been on nitrate therapy per her last nuclear perfusion study at [facility] in November 2013 suggested a small reversible defect involving the lateral inferior wall. Ejection fraction was 65%. She does have issues with some lower extremity edema. [name] does note occasional occasional episodes of sharp pain for which she has taken sublingual nitroglycerin with some benefit. She still notes some intermittent leg swelling right leg greater than left. She denies any prolonged episodes of chest tightness; she denies PND orthopnea  Past Medical History  Diagnosis Date  . Diabetes mellitus   . Hypertension   . Stroke Right Side Weakness  . CHF (congestive heart failure)     2D ECHO, 09/13/2011 - EF 40-45%, normal  . 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 Pain in limb     BILATERAL EXTREMITY VENOUS DUPLEX, 01/08/2011 - no evidence of deep vein or superficial thrombosis or Baker's cyst    Past Surgical History  Procedure Laterality Date  . Cardiac surgery    . Cardiac catheterization  10/27/2004    3 stents placed, 3.5x28 proximally,3.5x33 mid segment, and 3.5x33 in the region of the crux, stenosis being reduced to 0% at proximal and mid segment and  being reduced from 80% ot 10% in the stent at the cruxed segment  . Cardiac catheterization  09/26/2004    High grade 95% ostial stenosis in the RCA with 70% mid stenosis, 95% distal stenosis and total occlusion of the PDA with collaterals  . Coronary artery bypass graft  02/24/1999    x4; IMA to distal LAD, left radial to first circ, SVG to first diagonal, SVG to posterior descending coronary artery    Allergies  Allergen Reactions  . Penicillins Itching and Swelling    Current Outpatient Prescriptions  Medication Sig Dispense Refill  . acetaminophen (TYLENOL) 500 MG tablet Take 1,000 mg by mouth every 6 (six) hours as needed. pain       . clopidogrel (PLAVIX) 75 MG tablet Take 75 mg by mouth daily.        . colchicine 0.6 MG tablet Take 0.6 mg by mouth daily.       . Cranberry-Vitamin C-Probiotic (AZO CRANBERRY PO) Take 1 capsule by mouth 2 (two) times daily.      Marland Kitchen ezetimibe-simvastatin (VYTORIN) 10-20 MG per tablet Take 1 tablet by mouth at bedtime.        . folic acid (FOLVITE) 1 MG tablet Take 1 mg by mouth daily.       . metoprolol (LOPRESSOR) 50 MG tablet Take 50 mg by mouth daily.       Marland Kitchen NEXIUM 40 MG capsule TAKE ONE CAPSULE BY MOUTH EVERY DAY  30 capsule  5  .  nitroGLYCERIN (NITROSTAT) 0.4 MG SL tablet Place 0.4 mg under the tongue every 5 (five) minutes as needed.      . Phenazopyridine HCl (AZO TABS PO) Take 1 tablet by mouth 2 (two) times daily.      . polysaccharide iron (NIFEREX) 150 MG CAPS capsule Take 150 mg by mouth daily.       . primidone (MYSOLINE) 50 MG tablet Take 50 mg by mouth daily.        Marland Kitchen senna (SENOKOT) 8.6 MG tablet Take 1 tablet by mouth 2 (two) times daily.        Marland Kitchen torsemide (DEMADEX) 20 MG tablet Take 20 mg by mouth daily.        . isosorbide mononitrate (IMDUR) 30 MG 24 hr tablet Take 1 and 1/2 tablet daily  45 tablet  11  . lisinopril (PRINIVIL,ZESTRIL) 2.5 MG tablet Take 2.5 mg by mouth daily.      . [DISCONTINUED] pregabalin (LYRICA) 75 MG capsule  Take 75 mg by mouth daily.         No current facility-administered medications for this visit.    Socially she is married and has 6 children 10 grandchildren 2 great-grandchildren. She is originally from [place]  ROS is negative for fevers, chills or night sweats. She denies presyncope or syncope. She is unaware of palpitations. She does note sharp twinges of discomfort intermittently in her chest. She denies bleeding. She still notes occasional leg swelling. She sees Dr. Zachery Conch for primary care. She denies myalgias.  Other system review is negative.  PE BP 116/70  Pulse 69  Ht 5' (1.524 m)  General: Alert, oriented, no distress.  Skin: normal turgor, no rashes HEENT: Normocephalic, atraumatic. Pupils round and reactive; sclera anicteric;no lid lag.  Nose without nasal septal hypertrophy Mouth/Parynx benign; Mallinpatti scale 3 Neck: No JVD, no carotid briuts Lungs: clear to ausculatation and percussion; no wheezing or rales Heart: RRR, s1 s2 normal 1/6 systolic murmur Abdomen: soft, nontender; no hepatosplenomehaly, BS+; abdominal aorta nontender and not dilated by palpation. Pulses 2+ Extremities: no clubbing cyanosis or edema, Homan's sign negative  Neurologic: grossly nonfocal  ECG: Sinus rhythm with nonspecific ST abnormality the  LABS:  BMET    Component Value Date/Time   NA 138 12/03/2011 1420   K 3.3* 12/03/2011 1420   CL 99 12/03/2011 1420   CO2 31 12/03/2011 1420   GLUCOSE 125* 12/03/2011 1420   BUN 11 12/03/2011 1420   CREATININE 0.68 12/03/2011 1420   CALCIUM 7.8* 12/03/2011 1420   GFRNONAA 48* 10/04/2011 1545   GFRAA 55* 10/04/2011 1545     Hepatic Function Panel     Component Value Date/Time   PROT 6.8 10/04/2011 1545   ALBUMIN 2.9* 10/04/2011 1545   AST 23 10/04/2011 1545   ALT 19 10/04/2011 1545   ALKPHOS 86 10/04/2011 1545   BILITOT 0.2* 10/04/2011 1545     CBC    Component Value Date/Time   WBC 6.2 03/05/2013 1359   WBC 8.4 10/04/2011 1545   RBC 3.64*  03/05/2013 1359   RBC 3.48* 10/04/2011 1545   HGB 9.8* 03/05/2013 1359   HGB 9.1* 10/04/2011 1545   HCT 32.6* 03/05/2013 1359   HCT 29.0* 10/04/2011 1545   PLT 156 03/05/2013 1359   PLT 219 10/04/2011 1545   MCV 89.6 03/05/2013 1359   MCV 83.3 10/04/2011 1545   MCH 26.9 03/05/2013 1359   MCH 26.1 10/04/2011 1545   MCHC 30.1* 03/05/2013 1359   MCHC 31.4  10/04/2011 1545   RDW 17.4* 03/05/2013 1359   RDW 19.4* 10/04/2011 1545   LYMPHSABS 1.1 03/05/2013 1359   LYMPHSABS 1.6 10/04/2011 1545   MONOABS 0.5 03/05/2013 1359   MONOABS 0.7 10/04/2011 1545   EOSABS 0.1 03/05/2013 1359   EOSABS 0.3 10/04/2011 1545   BASOSABS 0.0 03/05/2013 1359   BASOSABS 0.0 10/04/2011 1545     BNP    Component Value Date/Time   PROBNP 282.7 09/26/2011 2234    Lipid Panel     Component Value Date/Time   CHOL  Value: 118        ATP III CLASSIFICATION:  <200     mg/dL   Desirable  161-096  mg/dL   Borderline High  >=045    mg/dL   High        12/31/8117 0155   TRIG 107 04/12/2009 0155   HDL  Value: 38 QA FLAGS MODIFIED BY DEMOGRAPHIC UPDATE ON 07/20 AT 0858 QA FLAGS MODIFIED BY DEMOGRAPHIC UPDATE ON 07/20 AT 1643 QA FLAGS MODIFIED BY DEMOGRAPHIC UPDATE ON 07/20 AT 1700 QA FLAGS MODIFIED BY DEMOGRAPHIC UPDATE ON 07/20 AT 2015 QA FLAGS MODIFIED BY  DEMOGRAPHIC UPDATE ON 07/21 AT 0040 04/12/2009 0155   CHOLHDL 3.1 04/12/2009 0155   VLDL 21 04/12/2009 0155   LDLCALC  Value: 59        Total Cholesterol/HDL:CHD Risk Coronary Heart Disease Risk Table                     Men   Women  1/2 Average Risk   3.4   3.3  Average Risk       5.0   4.4  2 X Average Risk   9.6   7.1  3 X Average Risk  23.4   11.0        Use the calculated Patient Ratio above and the CHD Risk Table to determine the patient's CHD Risk.        ATP III CLASSIFICATION (LDL):  <100     mg/dL   Optimal  147-829  mg/dL   Near or Above                    Optimal  130-159  mg/dL   Borderline  562-130  mg/dL   High  >865     mg/dL   Very High 7/84/6962 9528     RADIOLOGY: No  results found.    ASSESSMENT AND PLAN: Mrs. Reece Leader is now 14 years status post CBG surgery and 8 years status post stenting of the vein graft to the right current artery. She has a documented occlusion of her distal RCA with left to right collaterals. I am further titrating her isosorbide mononitrate to 45 mg. I also said that she can take an extra edema DACs on a when necessary basis for leg swelling. I will see her in 4 months for followup evaluation.     Lennette Bihari, MD, [facility]  03/19/2013 10:24 AM

## 2013-03-21 ENCOUNTER — Other Ambulatory Visit: Payer: Self-pay | Admitting: Cardiovascular Disease

## 2013-03-23 NOTE — Telephone Encounter (Signed)
Rx was sent to pharmacy electronically. 

## 2013-03-31 NOTE — Telephone Encounter (Signed)
Call pt's daughter and see if the increase to 1.5 tabs has improved her sharp pain in neck

## 2013-04-01 ENCOUNTER — Telehealth: Payer: Self-pay | Admitting: *Deleted

## 2013-04-01 NOTE — Telephone Encounter (Signed)
Spoke with daughter to inquire about her neck pain. Asked her if her symptoms got better after increasing the imdur to 1.5 tablets. She informed me that it has resolved completely.

## 2013-04-09 NOTE — Telephone Encounter (Signed)
See follow up note from [facility]

## 2013-04-29 LAB — LIPID PANEL
Cholesterol: 170 mg/dL (ref 0–200)
HDL: 44 mg/dL (ref >39)
LDL Cholesterol: 59 mg/dL (ref 0–99)
Triglycerides: 333 mg/dL — ABNORMAL HIGH (ref <150)

## 2013-04-29 LAB — CBC
MCHC: 31.6 g/dL (ref 30.0–36.0)
Platelets: 158 10*3/uL (ref 150–400)
RDW: 15.4 % (ref 11.5–15.5)
WBC: 6 10*3/uL (ref 4.0–10.5)

## 2013-04-29 LAB — COMPREHENSIVE METABOLIC PANEL
Alkaline Phosphatase: 99 U/L (ref 39–117)
BUN: 33 mg/dL — ABNORMAL HIGH (ref 6–23)
Glucose, Bld: 124 mg/dL — ABNORMAL HIGH (ref 70–99)
Sodium: 136 mEq/L (ref 135–145)
Total Bilirubin: 0.3 mg/dL (ref 0.3–1.2)
Total Protein: 6.7 g/dL (ref 6.0–8.3)

## 2013-05-01 ENCOUNTER — Ambulatory Visit (INDEPENDENT_AMBULATORY_CARE_PROVIDER_SITE_OTHER): Payer: Medicare Other | Admitting: Cardiovascular Disease

## 2013-05-01 ENCOUNTER — Encounter: Payer: Self-pay | Admitting: Cardiovascular Disease

## 2013-05-01 VITALS — BP 106/60 | HR 77 | Ht 63.0 in

## 2013-05-01 DIAGNOSIS — R609 Edema, unspecified: Secondary | ICD-10-CM

## 2013-05-01 DIAGNOSIS — R600 Localized edema: Secondary | ICD-10-CM

## 2013-05-01 DIAGNOSIS — I10 Essential (primary) hypertension: Secondary | ICD-10-CM

## 2013-05-01 DIAGNOSIS — I2510 Atherosclerotic heart disease of native coronary artery without angina pectoris: Secondary | ICD-10-CM

## 2013-05-01 DIAGNOSIS — E785 Hyperlipidemia, unspecified: Secondary | ICD-10-CM

## 2013-05-01 MED ORDER — OMEGA-3-ACID ETHYL ESTERS 1 G PO CAPS: 2.0000 g | ORAL_CAPSULE | Freq: Every day | ORAL | Status: DC

## 2013-05-01 MED ORDER — METOPROLOL TARTRATE 50 MG PO TABS: ORAL_TABLET | ORAL | Status: DC

## 2013-05-01 MED ORDER — ISOSORBIDE MONONITRATE ER 60 MG PO TB24: ORAL_TABLET | ORAL | Status: DC

## 2013-05-01 NOTE — Patient Instructions (Signed)
Your physician has recommended you make the following change in your medication: INCREASE THE ISOSORBIDE to 60 mg daily. The METOPROLOL TART to 50 mg in the AM and 25 mg at NIGHT. The new prescriptions has already been sent to your pharmacy.   Your physician recommends that you schedule a follow-up appointment in: 3 months.

## 2013-05-01 NOTE — Progress Notes (Signed)
Patient ID: [name], female   DOB: [birth year], 79 y.o.   MRN: [id number]     HPI: [name], is a 79 y.o. female who has known coronary artery disease and underwent CABG revascularization surgery in 2000. In January 2006 she was found to have subtotal occlusion of the vein graft to RCA and underwent stenting with resumption of normal flow. She is known distal occlusion of her PDA/PLA-like vessel with collaterals from left to right she has an atretic LIMA but pain vein graft to diagonal which fills the LAD. She has had issues with some intermittent episodes of chest pain for which she has been on nitrate therapy. In November 2013  A nuclear study suggested a small reversible defect involving the lateral inferior wall. Ejection fraction was 65%. She does have issues with some lower extremity edema. [name] does note occasional occasional episodes of sharp pain for which she has taken sublingual nitroglycerin with some benefit. She still notes some intermittent leg swelling right leg greater than left. When I saw her last 2 months ago recommended slight additional titration of her isosorbide mononitrate from 30 mg to 45 mg daily. This seems somewhat to help her chest pain. However, according to the daughter, she still has experienced several episodes of recurrent chest pain pain for which he does take sublingual nitroglycerin. Upon further questioning, the chest pain is more sharp fleeting rather than tightness and pressure but she does believe that sublingual nitroglycerin may help. She now presents for further evaluation.  Past Medical History  Diagnosis Date  . Diabetes mellitus   . Hypertension   . Stroke Right Side Weakness  . CHF (congestive heart failure)     2D ECHO, 09/13/2011 - EF 40-45%, normal  . Chest pain     NUCLEAR STRESS TEST, 08/08/2012 - reversible defect involving the lateral inferior wall, findings are concerning for pharmacologically induced ischemis in this  area, EF 65%  . Pain in limb     BILATERAL EXTREMITY VENOUS DUPLEX, 01/08/2011 - no evidence of deep vein or superficial thrombosis or Baker's cyst    Past Surgical History  Procedure Laterality Date  . Cardiac surgery    . Cardiac catheterization  10/27/2004    3 stents placed, 3.5x28 proximally,3.5x33 mid segment, and 3.5x33 in the region of the crux, stenosis being reduced to 0% at proximal and mid segment and being reduced from 80% ot 10% in the stent at the cruxed segment  . Cardiac catheterization  09/26/2004    High grade 95% ostial stenosis in the RCA with 70% mid stenosis, 95% distal stenosis and total occlusion of the PDA with collaterals  . Coronary artery bypass graft  02/24/1999    x4; IMA to distal LAD, left radial to first circ, SVG to first diagonal, SVG to posterior descending coronary artery    Allergies  Allergen Reactions  . Penicillins Itching and Swelling    Current Outpatient Prescriptions  Medication Sig Dispense Refill  . acetaminophen (TYLENOL) 500 MG tablet Take 1,000 mg by mouth every 6 (six) hours as needed. pain       . clopidogrel (PLAVIX) 75 MG tablet Take 75 mg by mouth daily.        . colchicine 0.6 MG tablet Take 0.6 mg by mouth daily.       . Cranberry-Vitamin C-Probiotic (AZO CRANBERRY PO) Take 1 capsule by mouth 2 (two) times daily.      . folic acid (FOLVITE) 1 MG tablet Take 1  mg by mouth daily.       . isosorbide mononitrate (IMDUR) 60 MG 24 hr tablet 1 tablet daily  30 tablet  11  . lisinopril (PRINIVIL,ZESTRIL) 2.5 MG tablet Take 2.5 mg by mouth daily.      . metoprolol (LOPRESSOR) 50 MG tablet Take 1 tablet QAM and 1/2 tablet QHS  45 tablet  11  . NEXIUM 40 MG capsule TAKE ONE CAPSULE BY MOUTH EVERY DAY  30 capsule  5  . nitroGLYCERIN (NITROSTAT) 0.4 MG SL tablet Place 0.4 mg under the tongue every 5 (five) minutes as needed.      . Phenazopyridine HCl (AZO TABS PO) Take 1 tablet by mouth 2 (two) times daily.      . polysaccharide iron (NIFEREX)  150 MG CAPS capsule Take 150 mg by mouth daily.       . primidone (MYSOLINE) 50 MG tablet Take 50 mg by mouth daily.        Marland Kitchen senna (SENOKOT) 8.6 MG tablet Take 1 tablet by mouth 2 (two) times daily.        Marland Kitchen torsemide (DEMADEX) 20 MG tablet TAKE ONE TABLET BY MOUTH EVERY DAY  30 tablet  10  . VYTORIN 10-20 MG per tablet TAKE ONE TABLET BY MOUTH BY MOUTH IN THE EVENING  30 tablet  10  . omega-3 acid ethyl esters (LOVAZA) 1 G capsule Take 2 capsules (2 g total) by mouth daily.  30 capsule  6  . [DISCONTINUED] pregabalin (LYRICA) 75 MG capsule Take 75 mg by mouth daily.         No current facility-administered medications for this visit.    Socially she is married and has 6 children 10 grandchildren 2 great-grandchildren. She is originally from [place]  ROS is negative for fevers, chills or night sweats. She denies presyncope or syncope. She is unaware of palpitations. She does note sharp twinges of discomfort intermittently in her chest. She denies abdominal bloating. She denies dyspeptic symptoms. She denies bleeding. She still notes occasional leg swelling. She sees Dr. Foy [name] for primary care. She denies myalgias.  Other system review is negative.  PE BP 106/60  Pulse 77  Ht 5\' 3"  (1.6 m)  General: Alert, oriented, no distress.  Skin: normal turgor, no rashes HEENT: Normocephalic, atraumatic. Pupils round and reactive; sclera anicteric;no lid lag.  Nose without nasal septal hypertrophy Mouth/Parynx benign; Mallinpatti scale 3 Neck: No JVD, no carotid briuts Lungs: clear to ausculatation and percussion; no wheezing or rales Heart: RRR, s1 s2 normal 1/6 systolic murmur Abdomen: soft, nontender; no hepatosplenomehaly, BS+; abdominal aorta nontender and not dilated by palpation. Pulses 2+ Extremities: no clubbing cyanosis or edema, Homan's sign negative  Neurologic: grossly nonfocal  ECG: Sinus rhythm at 77 beats per minute with occasional PVC. My ST-T changes inferolaterally. She has a  small Q wave in the lead 3.  LABS:  BMET    Component Value Date/Time   NA 136 04/29/2013 1345   K 4.8 04/29/2013 1345   CL 96 04/29/2013 1345   CO2 31 04/29/2013 1345   GLUCOSE 124* 04/29/2013 1345   BUN 33* 04/29/2013 1345   CREATININE 0.95 04/29/2013 1345   CREATININE 0.68 12/03/2011 1420   CALCIUM 9.2 04/29/2013 1345   GFRNONAA 48* 10/04/2011 1545   GFRAA 55* 10/04/2011 1545     Hepatic Function Panel     Component Value Date/Time   PROT 6.7 04/29/2013 1345   ALBUMIN 3.8 04/29/2013 1345   AST 18 04/29/2013  1345   ALT 21 04/29/2013 1345   ALKPHOS 99 04/29/2013 1345   BILITOT 0.3 04/29/2013 1345     CBC    Component Value Date/Time   WBC 6.0 04/29/2013 1345   WBC 6.2 03/05/2013 1359   RBC 4.23 04/29/2013 1345   RBC 3.64* 03/05/2013 1359   RBC 3.00* 01/08/2011 0625   HGB 11.0* 04/29/2013 1345   HGB 9.8* 03/05/2013 1359   HCT 34.8* 04/29/2013 1345   HCT 32.6* 03/05/2013 1359   PLT 158 04/29/2013 1345   PLT 156 03/05/2013 1359   MCV 82.3 04/29/2013 1345   MCV 89.6 03/05/2013 1359   MCH 26.0 04/29/2013 1345   MCH 26.9 03/05/2013 1359   MCHC 31.6 04/29/2013 1345   MCHC 30.1* 03/05/2013 1359   RDW 15.4 04/29/2013 1345   RDW 17.4* 03/05/2013 1359   LYMPHSABS 1.1 03/05/2013 1359   LYMPHSABS 1.6 10/04/2011 1545   MONOABS 0.5 03/05/2013 1359   MONOABS 0.7 10/04/2011 1545   EOSABS 0.1 03/05/2013 1359   EOSABS 0.3 10/04/2011 1545   BASOSABS 0.0 03/05/2013 1359   BASOSABS 0.0 10/04/2011 1545     BNP    Component Value Date/Time   PROBNP 282.7 09/26/2011 2234    Lipid Panel     Component Value Date/Time   CHOL 170 04/29/2013 1345   TRIG 333* 04/29/2013 1345   HDL 44 04/29/2013 1345   CHOLHDL 3.9 04/29/2013 1345   VLDL 67* 04/29/2013 1345   LDLCALC 59 04/29/2013 1345     RADIOLOGY: No results found.    ASSESSMENT AND PLAN: [name] is now 14 years status post CBG surgery and 8 years status post stenting of the vein graft to the RCA.  She has a documented occlusion of her distal RCA with left to right collaterals.  She has noticed some improvement in her symptoms with slight increase of her nitrate therapy at her last office visit. I'm not recommending further titration of her isosorbide mononitrate to 60 mg. In addition, she has been taking metoprolol tartrate at 50 mg only once a day. I will recommend that she also had a 25 mg dose to take at bedtime. She will monitor her blood pressure. I will see her in 3 months for followup evaluation or sooner problems arise.    Lennette Bihari, MD, [facility]  05/01/2013 2:54 PM

## 2013-05-08 ENCOUNTER — Other Ambulatory Visit: Payer: Self-pay | Admitting: *Deleted

## 2013-05-08 MED ORDER — OMEGA-3-ACID ETHYL ESTERS 1 G PO CAPS: 2.0000 g | ORAL_CAPSULE | Freq: Two times a day (BID) | ORAL | Status: DC

## 2013-05-08 NOTE — Progress Notes (Signed)
Quick Note:  Notified patient's daughter Angelyn Punt of results. Need to increase lovaza Up to 2 caps twice daily. New prescription sent to pharmacy reflecting the change. ______

## 2013-05-19 ENCOUNTER — Other Ambulatory Visit: Payer: Self-pay | Admitting: Cardiovascular Disease

## 2013-05-20 NOTE — Telephone Encounter (Signed)
Rx was sent to pharmacy electronically. 

## 2013-06-19 ENCOUNTER — Ambulatory Visit: Payer: Medicare Other | Admitting: Cardiovascular Disease

## 2013-07-20 ENCOUNTER — Other Ambulatory Visit: Payer: Self-pay | Admitting: Cardiovascular Disease

## 2013-07-21 NOTE — Telephone Encounter (Signed)
Rx was sent to pharmacy electronically. 

## 2013-07-31 ENCOUNTER — Ambulatory Visit: Payer: Medicare Other | Admitting: Cardiovascular Disease

## 2013-08-06 ENCOUNTER — Ambulatory Visit (INDEPENDENT_AMBULATORY_CARE_PROVIDER_SITE_OTHER): Payer: Medicare Other | Admitting: Cardiovascular Disease

## 2013-08-06 ENCOUNTER — Encounter: Payer: Self-pay | Admitting: Cardiovascular Disease

## 2013-08-06 VITALS — BP 110/60 | HR 64

## 2013-08-06 DIAGNOSIS — I2510 Atherosclerotic heart disease of native coronary artery without angina pectoris: Secondary | ICD-10-CM

## 2013-08-06 NOTE — Progress Notes (Signed)
Patient ID: [name], female   DOB: 1931/06/02, 79 y.o.   MRN: [id number]   HPI:  History:  [name], is a 79 y.o. female who has known coronary artery disease and underwent CABG revascularization surgery in 2000. In January 2006 she was found to have subtotal occlusion of the vein graft to RCA and underwent stenting with resumption of normal flow. She has known distal occlusion of her PDA/PLA-like vessel with collaterals from left to right she has an atretic LIMA but patent vein graft to diagonal which fills the LAD. Patient has been on nitrate therapy for intermittent chest pain for several years now. In November 2013  A nuclear study suggested a small reversible defect involving the lateral inferior wall. Ejection fraction was 65%. She also has intermittent lower extremity swelling for which she is on torsemide.   Interval history: Patient was seen in August 2014 complaining of slight increase in episodes of chest pain (primarily sharp and fleeting rather than pressure) as well as intermittent primarily nighttime palpitations. Her isosorbide mononitrate was increased to 60mg  at that time and she had 25 mg of toprol added at nighttime. She states that her chest pain has been much improved on this regimen and states she has only used nitroglycerin 1x (for one episode of sharp central pain) in the last 3 months. Unfortunately, patient is not very mobile and is completely dependent on family for transfers from bed to chair and no longer stands. Patient also complains of redness under bilateral breasts and in her groin for which she was recently given a cream by her primary care doctor. She has follow up with Dr. Foy Guadalajara in early December for this issue.   Past Medical History  Diagnosis Date  . Diabetes mellitus   . Hypertension   . Stroke Right Side Weakness  . CHF (congestive heart failure)     2D ECHO, 09/13/2011 - EF 40-45%, normal  . Chest pain     NUCLEAR STRESS TEST,  08/08/2012 - reversible defect involving the lateral inferior wall, findings are concerning for pharmacologically induced ischemis in this area, EF 65%  . Pain in limb     BILATERAL EXTREMITY VENOUS DUPLEX, 01/08/2011 - no evidence of deep vein or superficial thrombosis or Baker's cyst    Past Surgical History  Procedure Laterality Date  . Cardiac surgery    . Cardiac catheterization  10/27/2004    3 stents placed, 3.5x28 proximally,3.5x33 mid segment, and 3.5x33 in the region of the crux, stenosis being reduced to 0% at proximal and mid segment and being reduced from 80% ot 10% in the stent at the cruxed segment  . Cardiac catheterization  09/26/2004    High grade 95% ostial stenosis in the RCA with 70% mid stenosis, 95% distal stenosis and total occlusion of the PDA with collaterals  . Coronary artery bypass graft  02/24/1999    x4; IMA to distal LAD, left radial to first circ, SVG to first diagonal, SVG to posterior descending coronary artery    Allergies  Allergen Reactions  . Penicillins Itching and Swelling    Current Outpatient Prescriptions  Medication Sig Dispense Refill  . acetaminophen (TYLENOL) 500 MG tablet Take 1,000 mg by mouth every 6 (six) hours as needed. pain       . clopidogrel (PLAVIX) 75 MG tablet TAKE ONE TABLET BY MOUTH ONCE DAILY  30 tablet  6  . colchicine 0.6 MG tablet Take 0.6 mg by mouth daily.       Marland Kitchen  Cranberry-Vitamin C-Probiotic (AZO CRANBERRY PO) Take 1 capsule by mouth 2 (two) times daily.      . isosorbide mononitrate (IMDUR) 60 MG 24 hr tablet 1 tablet daily  30 tablet  11  . lisinopril (PRINIVIL,ZESTRIL) 2.5 MG tablet TAKE ONE TABLET BY MOUTH EVERY DAY  30 tablet  11  . metoprolol (LOPRESSOR) 50 MG tablet Take 1 tablet QAM and 1/2 tablet QHS  45 tablet  11  . NEXIUM 40 MG capsule TAKE ONE CAPSULE BY MOUTH EVERY DAY  30 capsule  5  . omega-3 acid ethyl esters (LOVAZA) 1 G capsule Take 2 capsules (2 g total) by mouth 2 (two) times daily.  120 capsule  6  .  Phenazopyridine HCl (AZO TABS PO) Take 1 tablet by mouth 2 (two) times daily.      . polysaccharide iron (NIFEREX) 150 MG CAPS capsule Take 150 mg by mouth daily.       . primidone (MYSOLINE) 50 MG tablet Take 50 mg by mouth daily.        Marland Kitchen senna (SENOKOT) 8.6 MG tablet Take 1 tablet by mouth 2 (two) times daily.        Marland Kitchen torsemide (DEMADEX) 20 MG tablet TAKE ONE TABLET BY MOUTH EVERY DAY  30 tablet  10  . VYTORIN 10-20 MG per tablet TAKE ONE TABLET BY MOUTH BY MOUTH IN THE EVENING  30 tablet  10  . [DISCONTINUED] pregabalin (LYRICA) 75 MG capsule Take 75 mg by mouth daily.         No current facility-administered medications for this visit.    Socially she is married and has 6 children 10 grandchildren 2 great-grandchildren. She is originally from [place]  ROS is negative for fevers, chills or night sweats. She denies presyncope or syncope. She denies any recent palpitations. She does note one occasion of sharp twinges of discomfort in her chest for which she took nitroglycerin once. She denies abdominal bloating. She denies dyspeptic symptoms. She denies bleeding. She still notes occasional leg swelling. She sees Dr. Foy Guadalajara for primary care. She denies myalgias. Rash under both breasts noted.  Other comprehensive 12 point system review is negative.  PE BP 110/60  Pulse 64  General: Alert, oriented, no distress. Sitting in a wheelchair, dependent for mobility, tremor noted in bilateral hands.  Skin: normal turgor, under both breasts has a thick white cream with possible underlying slight erythema without obvious satellite lesions.  HEENT: Normocephalic, atraumatic. Pupils round and reactive; sclera anicteric. Nose without nasal septal hypertrophy Mouth/Parynx benign; Mallinpatti scale 3 Neck: No JVD, no carotid briuts Lungs: clear to ausculatation and percussion; no wheezing or rales Heart: RRR, s1 s2 normal . I did not appreciate the murmur heard in august reported as 1/6 SEM but heart  sounds distant due to obesity.  Abdomen: soft, nontender; BS+ Pulses 2+ Extremities: obese at bilateral ankles without pitting edema Neurologic: grossly nonfocal, tremor noted in general section  ECG: Sinus rhythm at 64 beats per minute with occasional PVC. She has a small Q wave in the lead 3. Flat t waves in I and aVL. Normal intervals.   LABS:  BMET    Component Value Date/Time   NA 136 04/29/2013 1345   K 4.8 04/29/2013 1345   CL 96 04/29/2013 1345   CO2 31 04/29/2013 1345   GLUCOSE 124* 04/29/2013 1345   BUN 33* 04/29/2013 1345   CREATININE 0.95 04/29/2013 1345   CREATININE 0.68 12/03/2011 1420   CALCIUM 9.2 04/29/2013 1345  GFRNONAA 48* 10/04/2011 1545   GFRAA 55* 10/04/2011 1545     Hepatic Function Panel     Component Value Date/Time   PROT 6.7 04/29/2013 1345   ALBUMIN 3.8 04/29/2013 1345   AST 18 04/29/2013 1345   ALT 21 04/29/2013 1345   ALKPHOS 99 04/29/2013 1345   BILITOT 0.3 04/29/2013 1345     CBC    Component Value Date/Time   WBC 6.0 04/29/2013 1345   WBC 6.2 03/05/2013 1359   RBC 4.23 04/29/2013 1345   RBC 3.64* 03/05/2013 1359   RBC 3.00* 01/08/2011 0625   HGB 11.0* 04/29/2013 1345   HGB 9.8* 03/05/2013 1359   HCT 34.8* 04/29/2013 1345   HCT 32.6* 03/05/2013 1359   PLT 158 04/29/2013 1345   PLT 156 03/05/2013 1359   MCV 82.3 04/29/2013 1345   MCV 89.6 03/05/2013 1359   MCH 26.0 04/29/2013 1345   MCH 26.9 03/05/2013 1359   MCHC 31.6 04/29/2013 1345   MCHC 30.1* 03/05/2013 1359   RDW 15.4 04/29/2013 1345   RDW 17.4* 03/05/2013 1359   LYMPHSABS 1.1 03/05/2013 1359   LYMPHSABS 1.6 10/04/2011 1545   MONOABS 0.5 03/05/2013 1359   MONOABS 0.7 10/04/2011 1545   EOSABS 0.1 03/05/2013 1359   EOSABS 0.3 10/04/2011 1545   BASOSABS 0.0 03/05/2013 1359   BASOSABS 0.0 10/04/2011 1545     BNP    Component Value Date/Time   PROBNP 282.7 09/26/2011 2234    Lipid Panel     Component Value Date/Time   CHOL 170 04/29/2013 1345   TRIG 333* 04/29/2013 1345   HDL 44 04/29/2013 1345   CHOLHDL 3.9 04/29/2013 1345     VLDL 67* 04/29/2013 1345   LDLCALC 59 04/29/2013 1345     RADIOLOGY: No results found.   ASSESSMENT AND PLAN: Mrs. Wetherell is now 14 years status post CABG surgery and 8 years status post stenting of the vein graft to the RCA.    Regarding CAD, She has a documented occlusion of her distal RCA with left to right collaterals. She most recently had a nuclear stress test in November 2013 which suggested a small reversible defect involving the lateral inferior wallPatient on nitrate therapy and has only used nitroglycerin 1x over last 3 months which is an improvement since her Imdur was increased to 60mg . Will continue at current dose.  Regarding previous nighttime palpitations, these have improved with 25mg  metoprolol tartrate at night in addition to 50mg  in day.   Regarding hypertension, blood pressure is well controlled today and will continue current therapy.   Will plan on follow up in 3 months or sooner if problems arise. Regarding her rash under her breasts, advised follow up with PCP given that patient does not know the present medication being used and unclear if this is a steroid or antifungal and would not want to interfere with PCP planned treatment.  ______________________________________________________________________________________________________________  Dr. Tresa Endo has seen and evaluated the patient. We have discussed the history, exam, assessment, and plan as noted above. He agrees with management.   Aldine Contes. Marti Sleigh, MD, PGY-3 [facility] 08/06/2013 4:29 PM  Pt was seen and examined. She is well known to me. Presently, she is doing well on her current medical regimen without significant symptoms of angina. Her leg swelling is controlled and it is not appear to be residual significant edema. She's not having any further significant tachycardia palpitations and is tolerating low-dose metoprolol tartrate 50 mg in the  morning and 25 mg at night. She  will followup with Dr. Foy Guadalajara for her skin rash. Her EKG remained stable with rare isolated PVC. I will see her in 6 months for cardiology reassessment.  Lennette Bihari, MD, [facility]  08/06/2013 4:07 PM

## 2013-08-06 NOTE — Patient Instructions (Signed)
Your physician recommends that you schedule a follow-up appointment in: 6 months  

## 2013-08-07 ENCOUNTER — Ambulatory Visit: Payer: Medicare Other | Admitting: Cardiovascular Disease

## 2013-08-13 ENCOUNTER — Other Ambulatory Visit: Payer: Self-pay | Admitting: Cardiovascular Disease

## 2013-08-13 NOTE — Telephone Encounter (Signed)
Rx was sent to pharmacy electronically. 

## 2013-08-18 ENCOUNTER — Other Ambulatory Visit: Payer: Self-pay | Admitting: *Deleted

## 2013-08-18 MED ORDER — ISOSORBIDE MONONITRATE ER 60 MG PO TB24: ORAL_TABLET | ORAL | Status: DC

## 2013-08-18 NOTE — Telephone Encounter (Signed)
Rx was sent to pharmacy electronically. 

## 2013-09-04 ENCOUNTER — Telehealth: Payer: Self-pay | Admitting: Nurse Practitioner

## 2013-09-04 ENCOUNTER — Other Ambulatory Visit (HOSPITAL_BASED_OUTPATIENT_CLINIC_OR_DEPARTMENT_OTHER): Payer: Medicare Other

## 2013-09-04 ENCOUNTER — Ambulatory Visit (HOSPITAL_BASED_OUTPATIENT_CLINIC_OR_DEPARTMENT_OTHER): Payer: Medicare Other | Admitting: Nurse Practitioner

## 2013-09-04 VITALS — BP 133/64 | HR 68 | Temp 98.1°F | Resp 20 | Ht 63.0 in

## 2013-09-04 DIAGNOSIS — D649 Anemia, unspecified: Secondary | ICD-10-CM

## 2013-09-04 DIAGNOSIS — D509 Iron deficiency anemia, unspecified: Secondary | ICD-10-CM

## 2013-09-04 DIAGNOSIS — D696 Thrombocytopenia, unspecified: Secondary | ICD-10-CM

## 2013-09-04 DIAGNOSIS — L989 Disorder of the skin and subcutaneous tissue, unspecified: Secondary | ICD-10-CM

## 2013-09-04 LAB — CBC WITH DIFFERENTIAL/PLATELET
BASO%: 1.1 % (ref 0.0–2.0)
Basophils Absolute: 0.1 10*3/uL (ref 0.0–0.1)
EOS%: 2.3 % (ref 0.0–7.0)
HCT: 34 % — ABNORMAL LOW (ref 34.8–46.6)
HGB: 10.7 g/dL — ABNORMAL LOW (ref 11.6–15.9)
MCH: 26 pg (ref 25.1–34.0)
MCHC: 31.5 g/dL (ref 31.5–36.0)
MCV: 82.7 fL (ref 79.5–101.0)
MONO#: 0.6 10*3/uL (ref 0.1–0.9)
MONO%: 8.7 % (ref 0.0–14.0)
NEUT%: 68.4 % (ref 38.4–76.8)
lymph#: 1.4 10*3/uL (ref 0.9–3.3)

## 2013-09-04 NOTE — Progress Notes (Signed)
OFFICE PROGRESS NOTE  Interval history:  [name] returns as scheduled for a followup of anemia. She remains bedbound. Her daughters move her with a "lift". Digital overall she is doing well. Her appetite varies. They are concerned about a lesion on the left side of her face that was removed in the past and has recurred. They are also concerned about some skin lesions on the right foot.   Objective: Blood pressure 133/64, pulse 68, temperature 98.1 F (36.7 C), temperature source Oral, resp. rate 20, height 5\' 3"  (1.6 m), weight 0 lb (0 kg).  No thrush. Lungs are clear. Regular cardiac rhythm. Abdomen soft and nontender. No organomegaly. Pitting edema at the lower legs bilaterally. Approximate 1 cm round scaly raised lesion at the left cheek in an area of skin hyperpigmentation. Smaller nodular lesion inferior to the larger lesion. A few papular 1 mm skin lesions plantar aspect right foot.  Lab Results: Lab Results  Component Value Date   WBC 7.3 09/04/2013   HGB 10.7* 09/04/2013   HCT 34.0* 09/04/2013   MCV 82.7 09/04/2013   PLT 143* 09/04/2013    Chemistry:    Chemistry      Component Value Date/Time   NA 136 04/29/2013 1345   K 4.8 04/29/2013 1345   CL 96 04/29/2013 1345   CO2 31 04/29/2013 1345   BUN 33* 04/29/2013 1345   CREATININE 0.95 04/29/2013 1345   CREATININE 0.68 12/03/2011 1420      Component Value Date/Time   CALCIUM 9.2 04/29/2013 1345   ALKPHOS 99 04/29/2013 1345   AST 18 04/29/2013 1345   ALT 21 04/29/2013 1345   BILITOT 0.3 04/29/2013 1345       Studies/Results: No results found.  Medications: I have reviewed the patient's current medications.  Assessment/Plan:  1. History of a microcytic anemia. The MCV remains in the normal range. The hemoglobin is stable.  2. History of coronary artery disease.  3. "Gout."  4. "Giant" platelets on the blood smear from 01/29/2011.  5. 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 She is unable to ambulate.  6. History of a zoster rash  at the left lower back and chest.  7. Admission with pneumonia and a CHF exacerbation in December of 2012. 8. Skin lesions left face and right foot.  Disposition-blood counts remain stable. We will continue to follow with routine lab and office visits.   Her daughters would like evaluation of the skin lesions by a dermatologist. We made a referral at today's visit.  She will return for a followup visit and CBC in 6 months.   Lonna Cobb ANP/GNP-BC

## 2013-09-04 NOTE — Telephone Encounter (Signed)
worked 12/12 POF appts made and AVS and CAL given Tried to make referral to [facility] but 1st avail not until April 2015 Email to [place] [facility] awaiting response and will call pt shh

## 2013-09-07 ENCOUNTER — Telehealth: Payer: Self-pay | Admitting: Oncology

## 2013-09-07 NOTE — Telephone Encounter (Signed)
communicated w Misty [place] T regarding dematology referral Pt would have to go to [place] to discuss w Dr GBS and get back with me if I need to do any further work shh

## 2013-10-28 ENCOUNTER — Telehealth: Payer: Self-pay | Admitting: Cardiovascular Disease

## 2013-10-28 MED ORDER — EZETIMIBE-SIMVASTATIN 10-20 MG PO TABS: 1.0000 | ORAL_TABLET | Freq: Every day | ORAL | Status: DC

## 2013-10-28 NOTE — Telephone Encounter (Signed)
Her insurance will no longer pay for her Zytrin.What does she need to take now?

## 2013-10-28 NOTE — Telephone Encounter (Signed)
Returned call and pt verified x 2 w/ Raja, pt's daughter.  Stated pt's insurance doesn't cover Vytorin anymore and she wants to know what pt needs to do.  Pt has about 3 days left.  Informed samples available and will be left at front desk for pick up.  Also informed Dr. Pierre BaliKelly/Wanda, CMA will be notified to determine if they will seek approval or prescribe something else.  Verbalized understanding.  Message forwarded to Dr. Pierre BaliKelly/Wanda, CMA.

## 2013-11-04 NOTE — Telephone Encounter (Signed)
[name] 

## 2013-11-05 MED ORDER — ATORVASTATIN CALCIUM 20 MG PO TABS: 20.0000 mg | ORAL_TABLET | Freq: Every day | ORAL | Status: DC

## 2013-11-05 NOTE — Telephone Encounter (Signed)
Pt still taking Lovaza 2gm bid, will switch vytorin 10/20 to atorvastatin 20mg .  Spoke with Raja and explained new medication.  Rx sent to pharmacy

## 2013-11-05 NOTE — Addendum Note (Signed)
Addended by: Rosalee KaufmanALVSTAD, KRISTIN L. on: 11/05/2013 01:37 PM   Modules accepted: Orders

## 2013-11-13 ENCOUNTER — Telehealth: Payer: Self-pay | Admitting: *Deleted

## 2013-11-13 NOTE — Telephone Encounter (Signed)
Returned call and pt verified x 2 w/ Raja, pt's daughter.  Stated pt is out of Nexium and pharmacy said insurance won't pay for it.  Stated the pharmacy said the faxed the office and haven't heard back.  Daughter informed fax not received and RN will call pharmacy for PA number.  Advised pt take otc med until response given.  Daughter verbalized understanding and stated she has purchased Zantac.  Agreed w/ plan.  Call to pharmacy and informed they have faxed the office several times and she will see if it will go through now.  Informed no fax received.  Confirmed fax number: [phone].  Will fax again and gave number to Pharmacy Help Desk.  Help Desk [phone]  Message forwarded to [place], [place].

## 2013-11-13 NOTE — Telephone Encounter (Signed)
Pt's daughter called stating that she went to fill Nexium and the pharmacy told them that it needed a PA. The pharmacy stated that they have not heard back from us yet and she needs this filled.  Tk

## 2013-11-17 ENCOUNTER — Other Ambulatory Visit: Payer: Self-pay | Admitting: Cardiovascular Disease

## 2013-11-21 ENCOUNTER — Other Ambulatory Visit: Payer: Self-pay | Admitting: Cardiovascular Disease

## 2013-11-24 ENCOUNTER — Other Ambulatory Visit: Payer: Self-pay | Admitting: Cardiovascular Disease

## 2013-11-24 NOTE — Telephone Encounter (Signed)
Please advise 

## 2013-11-27 ENCOUNTER — Other Ambulatory Visit: Payer: Self-pay | Admitting: Cardiovascular Disease

## 2013-12-01 ENCOUNTER — Other Ambulatory Visit: Payer: Self-pay | Admitting: Cardiovascular Disease

## 2013-12-02 NOTE — Telephone Encounter (Signed)
Rx was sent to pharmacy electronically. 

## 2013-12-24 ENCOUNTER — Encounter: Payer: Self-pay | Admitting: *Deleted

## 2013-12-24 NOTE — Progress Notes (Signed)
[facility] Clinical Social Work  Clinical Social Work was referred by pt's daughter for assessment of psychosocial needs due to transportation needs.  Clinical Social Worker contacted daughter via phone and provided daughter with additional community resources for transportation. [facility]. Daughter able to call medicaid transportation and [employer] to further pursue as she reports her mother only needs transport about once every six months to appointments. Daughter very appreciative, no other needs currently.   Clinical Social Work interventions: Resource and referral  Monique SalvageGrier Porsha Skilton, LCSW Clinical Social Worker [facility] Wednesday, Thursday and Friday Phone: [phone] Fax: [phone]

## 2014-02-16 ENCOUNTER — Other Ambulatory Visit: Payer: Self-pay | Admitting: Cardiovascular Disease

## 2014-02-17 NOTE — Telephone Encounter (Signed)
Rx was sent to pharmacy electronically. 

## 2014-03-01 ENCOUNTER — Telehealth: Payer: Self-pay | Admitting: Oncology

## 2014-03-01 NOTE — Telephone Encounter (Signed)
Called pt and left message regarding appt for lab and Md on 6/12 r/s to 6/15 due to MD PAL

## 2014-03-05 ENCOUNTER — Other Ambulatory Visit: Payer: Medicare Other

## 2014-03-05 ENCOUNTER — Ambulatory Visit: Payer: Medicare Other | Admitting: Oncology

## 2014-03-08 ENCOUNTER — Other Ambulatory Visit (HOSPITAL_BASED_OUTPATIENT_CLINIC_OR_DEPARTMENT_OTHER): Payer: Medicare Other

## 2014-03-08 ENCOUNTER — Telehealth: Payer: Self-pay | Admitting: Oncology

## 2014-03-08 ENCOUNTER — Ambulatory Visit (HOSPITAL_BASED_OUTPATIENT_CLINIC_OR_DEPARTMENT_OTHER): Payer: Medicare Other | Admitting: Oncology

## 2014-03-08 VITALS — BP 151/61 | HR 76 | Temp 98.4°F | Resp 18

## 2014-03-08 DIAGNOSIS — D649 Anemia, unspecified: Secondary | ICD-10-CM

## 2014-03-08 DIAGNOSIS — D696 Thrombocytopenia, unspecified: Secondary | ICD-10-CM

## 2014-03-08 DIAGNOSIS — D509 Iron deficiency anemia, unspecified: Secondary | ICD-10-CM

## 2014-03-08 LAB — CBC WITH DIFFERENTIAL/PLATELET
BASO%: 0.5 % (ref 0.0–2.0)
BASOS ABS: 0 10*3/uL (ref 0.0–0.1)
EOS%: 5 % (ref 0.0–7.0)
Eosinophils Absolute: 0.3 10*3/uL (ref 0.0–0.5)
HEMATOCRIT: 35.8 % (ref 34.8–46.6)
HEMOGLOBIN: 10.9 g/dL — AB (ref 11.6–15.9)
LYMPH%: 22.3 % (ref 14.0–49.7)
MCH: 25.1 pg (ref 25.1–34.0)
MCHC: 30.4 g/dL — ABNORMAL LOW (ref 31.5–36.0)
MCV: 82.5 fL (ref 79.5–101.0)
MONO#: 0.4 10*3/uL (ref 0.1–0.9)
MONO%: 7.9 % (ref 0.0–14.0)
NEUT#: 3.6 10*3/uL (ref 1.5–6.5)
NEUT%: 64.3 % (ref 38.4–76.8)
Platelets: 120 10*3/uL — ABNORMAL LOW (ref 145–400)
RBC: 4.34 10*6/uL (ref 3.70–5.45)
RDW: 17 % — ABNORMAL HIGH (ref 11.2–14.5)
WBC: 5.6 10*3/uL (ref 3.9–10.3)
lymph#: 1.2 10*3/uL (ref 0.9–3.3)
nRBC: 0 % (ref 0–0)

## 2014-03-08 NOTE — Telephone Encounter (Signed)
gv adn printed appt sched anda vs for pt for Feb °

## 2014-03-08 NOTE — Progress Notes (Signed)
  [facility] OFFICE PROGRESS NOTE   Diagnosis: Anemia/thrombocytopenia  INTERVAL HISTORY:   She returns with her daughters. They report she has a decreased appetite, but she is not losing weight. Occasional episodes of "choking "and nausea. No bleeding. She recently had a "sore "at one of the right toes. This has healed. The oxygen level drops when she is taken off of the nasal cannula.  Objective:  Vital signs in last 24 hours:  Blood pressure 151/61, pulse 76, temperature 98.4 F (36.9 C), temperature source Oral, resp. rate 18, weight 0 lb (0 kg), SpO2 100.00%.    HEENT: Neck without mass Lymphatics: No cervical, supra-auricular, or axillary nodes Resp: Bilateral expiratory wheezes, good air movement bilaterally, no respiratory distress Cardio: Regular rate and rhythm GI: No hepatomegaly Vascular: Trace edema at the right greater than left lower leg. Good capillary refill at the toes bilaterally. Neuro: Alert, follows , right hand tremor, moves all extremities Skin: 3-4 mm healing ulcer at the dorsum of a right distal toe     Lab Results:  Lab Results  Component Value Date   WBC 5.6 03/08/2014   HGB 10.9* 03/08/2014   HCT 35.8 03/08/2014   MCV 82.5 03/08/2014   PLT 120* 03/08/2014   NEUTROABS 3.6 03/08/2014   Medications: I have reviewed the patient's current medications.  Assessment/Plan: 1. History of a microcytic anemia. The MCV remains in the normal range. The hemoglobin is stable.  2. History of coronary artery disease.  3. "Gout."  4. "Giant" platelets on the blood smear from 01/29/2011.  5. 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 She is unable to ambulate.  6. History of a zoster rash at the left lower back and chest.  7. Admission with pneumonia and a CHF exacerbation in December of 2012. 8. Mild thrombocytopenia 9. Wheezing-I recommended the daughters discuss use of an inhaler with Dr. Foy GuadalajaraFried   Disposition:  She is stable from a hematologic  standpoint. She remains chair/bedbound following a CVA. She could have myelodysplasia or another hematologic condition, but I do not recommend further diagnostic evaluation unless the anemia/thrombocytopenia progresses. She will return for an office visit and CBC in 8 months. We will see her sooner as needed.  Thornton PapasSHERRILL, Reef Achterberg, MD  03/08/2014  1:06 PM

## 2014-03-16 ENCOUNTER — Telehealth: Payer: Self-pay | Admitting: Nurse Practitioner

## 2014-03-16 NOTE — Telephone Encounter (Signed)
I spoke with the patient's daughter, Angelyn PuntRaja, regarding the dermatology referral we made in December 2014 for evaluation of a lesion on [name]'s face. Due to insurance reasons she was unable to see a dermatologist. Ms. Ezzie DuralZaitawi has an appointment next month with her primary provider. Her daughter will discuss a dermatology referral with Dr. Foy GuadalajaraFried.

## 2014-04-08 ENCOUNTER — Other Ambulatory Visit (HOSPITAL_COMMUNITY): Payer: Self-pay | Admitting: Cardiology

## 2014-04-08 DIAGNOSIS — E1159 Type 2 diabetes mellitus with other circulatory complications: Secondary | ICD-10-CM

## 2014-04-12 ENCOUNTER — Ambulatory Visit (HOSPITAL_COMMUNITY)
Admission: RE | Admit: 2014-04-12 | Discharge: 2014-04-12 | Disposition: A | Discharge status: Home / Self Care | Payer: Medicare Other | Source: Ambulatory Visit | Attending: Cardiovascular Disease | Admitting: Cardiovascular Disease

## 2014-04-12 ENCOUNTER — Encounter (HOSPITAL_COMMUNITY): Payer: Medicare Other

## 2014-04-12 ENCOUNTER — Other Ambulatory Visit: Payer: Self-pay | Admitting: *Deleted

## 2014-04-12 ENCOUNTER — Encounter: Payer: Self-pay | Admitting: Cardiovascular Disease

## 2014-04-12 ENCOUNTER — Ambulatory Visit (INDEPENDENT_AMBULATORY_CARE_PROVIDER_SITE_OTHER): Payer: Medicare Other | Admitting: Cardiovascular Disease

## 2014-04-12 VITALS — BP 140/80 | HR 87 | Ht 63.0 in

## 2014-04-12 DIAGNOSIS — K219 Gastro-esophageal reflux disease without esophagitis: Secondary | ICD-10-CM

## 2014-04-12 DIAGNOSIS — R609 Edema, unspecified: Secondary | ICD-10-CM

## 2014-04-12 DIAGNOSIS — R600 Localized edema: Secondary | ICD-10-CM

## 2014-04-12 DIAGNOSIS — I209 Angina pectoris, unspecified: Secondary | ICD-10-CM

## 2014-04-12 DIAGNOSIS — I2510 Atherosclerotic heart disease of native coronary artery without angina pectoris: Secondary | ICD-10-CM

## 2014-04-12 DIAGNOSIS — I10 Essential (primary) hypertension: Secondary | ICD-10-CM

## 2014-04-12 DIAGNOSIS — E118 Type 2 diabetes mellitus with unspecified complications: Secondary | ICD-10-CM

## 2014-04-12 DIAGNOSIS — E785 Hyperlipidemia, unspecified: Secondary | ICD-10-CM

## 2014-04-12 DIAGNOSIS — I25119 Atherosclerotic heart disease of native coronary artery with unspecified angina pectoris: Secondary | ICD-10-CM

## 2014-04-12 DIAGNOSIS — E1159 Type 2 diabetes mellitus with other circulatory complications: Secondary | ICD-10-CM

## 2014-04-12 DIAGNOSIS — E871 Hypo-osmolality and hyponatremia: Secondary | ICD-10-CM

## 2014-04-12 DIAGNOSIS — I5022 Chronic systolic (congestive) heart failure: Secondary | ICD-10-CM

## 2014-04-12 LAB — CBC
HCT: 35.2 % — ABNORMAL LOW (ref 36.0–46.0)
Hemoglobin: 10.7 g/dL — ABNORMAL LOW (ref 12.0–15.0)
MCH: 25.7 pg — AB (ref 26.0–34.0)
MCHC: 30.4 g/dL (ref 30.0–36.0)
MCV: 84.6 fL (ref 78.0–100.0)
Platelets: 161 10*3/uL (ref 150–400)
RBC: 4.16 MIL/uL (ref 3.87–5.11)
RDW: 18.7 % — ABNORMAL HIGH (ref 11.5–15.5)
WBC: 6.1 10*3/uL (ref 4.0–10.5)

## 2014-04-12 MED ORDER — TORSEMIDE 20 MG PO TABS: 20.0000 mg | ORAL_TABLET | Freq: Two times a day (BID) | ORAL | Status: DC

## 2014-04-12 MED ORDER — METOPROLOL TARTRATE 50 MG PO TABS: 50.0000 mg | ORAL_TABLET | Freq: Two times a day (BID) | ORAL | Status: DC

## 2014-04-12 MED ORDER — PANTOPRAZOLE SODIUM 40 MG PO TBEC: 40.0000 mg | DELAYED_RELEASE_TABLET | Freq: Every day | ORAL | Status: DC

## 2014-04-12 NOTE — Patient Instructions (Addendum)
Your physician has requested that you have a lower or upper extremity arterial duplex. This test is an ultrasound of the arteries in the legs or arms. It looks at arterial blood flow in the legs and arms. Allow one hour for Lower and Upper Arterial scans. There are no restrictions or special instructions  Your physician has requested that you have a lower or upper extremity venous duplex. This test is an ultrasound of the veins in the legs or arms. It looks at venous blood flow that carries blood from the heart to the legs or arms. Allow one hour for a Lower Venous exam. Allow thirty minutes for an Upper Venous exam. There are no restrictions or special instructions.   Increase Lopressor to Twice daily Demedex to TWICE daily  Start Protonix 40mg  Daily   Your physician recommends that you schedule a follow-up appointment in:[facility]

## 2014-04-13 LAB — COMPREHENSIVE METABOLIC PANEL
ALK PHOS: 87 U/L (ref 39–117)
ALT: 19 U/L (ref 0–35)
AST: 27 U/L (ref 0–37)
Albumin: 3.5 g/dL (ref 3.5–5.2)
BILIRUBIN TOTAL: 0.4 mg/dL (ref 0.2–1.2)
BUN: 24 mg/dL — ABNORMAL HIGH (ref 6–23)
CO2: 28 mEq/L (ref 19–32)
Calcium: 8.7 mg/dL (ref 8.4–10.5)
Chloride: 96 mEq/L (ref 96–112)
Creat: 0.87 mg/dL (ref 0.50–1.10)
Glucose, Bld: 120 mg/dL — ABNORMAL HIGH (ref 70–99)
POTASSIUM: 3.8 meq/L (ref 3.5–5.3)
SODIUM: 139 meq/L (ref 135–145)
TOTAL PROTEIN: 6.2 g/dL (ref 6.0–8.3)

## 2014-04-13 LAB — TSH: TSH: 1.831 u[IU]/mL (ref 0.350–4.500)

## 2014-04-13 LAB — HEPATIC FUNCTION PANEL
ALBUMIN: 3.5 g/dL (ref 3.5–5.2)
ALT: 20 U/L (ref 0–35)
AST: 26 U/L (ref 0–37)
Alkaline Phosphatase: 87 U/L (ref 39–117)
BILIRUBIN TOTAL: 0.4 mg/dL (ref 0.2–1.2)
Bilirubin, Direct: 0.1 mg/dL (ref 0.0–0.3)
Indirect Bilirubin: 0.3 mg/dL (ref 0.2–1.2)
Total Protein: 6.2 g/dL (ref 6.0–8.3)

## 2014-04-13 LAB — LIPID PANEL
CHOL/HDL RATIO: 4.7 ratio
Cholesterol: 182 mg/dL (ref 0–200)
HDL: 39 mg/dL — AB (ref >39)
LDL CALC: 103 mg/dL — AB (ref 0–99)
TRIGLYCERIDES: 199 mg/dL — AB (ref <150)
VLDL: 40 mg/dL (ref 0–40)

## 2014-04-14 ENCOUNTER — Encounter (HOSPITAL_COMMUNITY): Payer: Self-pay | Admitting: Family Medicine

## 2014-04-17 ENCOUNTER — Encounter: Payer: Self-pay | Admitting: Cardiovascular Disease

## 2014-04-17 DIAGNOSIS — E119 Type 2 diabetes mellitus without complications: Secondary | ICD-10-CM | POA: Insufficient documentation

## 2014-04-17 NOTE — Progress Notes (Signed)
Patient ID: [name], female   DOB: Mar 07, 1931, 80 y.o.   MRN: [id number]     HPI: LOUANN HOPSON is a 80 y.o. female who presents to the office today for one-year cardiology evaluation.    Ms Vanduyn has known coronary artery disease and underwent CABG revascularization surgery in 2000. In January 2006 she was found to have subtotal occlusion of the vein graft to RCA and underwent stenting with resumption of normal flow. She has known distal occlusion of her PDA/PLA-like vessel with collaterals from left to right she has an atretic LIMA but patent vein graft to diagonal which fills the LAD. She has had issues with some intermittent episodes of chest pain for which she has been on nitrate therapy. In November 2013  A nuclear study suggested a small reversible defect involving the lateral inferior wall. Ejection fraction was 65%. She does have issues with some lower extremity edema. Mrs. Twiggs does note occasional occasional episodes of sharp pain for which she has taken sublingual nitroglycerin with some benefit. She still notes some intermittent leg swelling right leg greater than left. When I saw her last year I recommended slight additional titration of her isosorbide mononitrate from 30 mg to 45 mg daily and ultimately to 60 mg daily. This seems somewhat to help her chest pain. However, according to the daughter, she still has experienced several episodes of recurrent chest pain pain for which he does take sublingual nitroglycerin. Upon further questioning, the chest pain is more sharp fleeting rather than tightness and pressure but she does believe that sublingual nitroglycerin may help. She now presents for further evaluation.  She presented to the office today and apparently was to undergo lotion and a Doppler studies.  However, she was unable to get out of the wheelchair and was unable to get on the stretcher for the procedure.  Consequently, this was unable to be performed in the  office setting and will need to be scheduled to be done in the hospital setting.  She does note continued episodes of intermittent leg swelling.  She's had some difficulty with short-term memory.  She also notes GERD symptoms.  Her chest pain has improved with the increased dose of isosorbide mononitrate.  She denies bleeding, and continues antiplatelet therapy with Plavix.  Past Medical History  Diagnosis Date  . Diabetes mellitus   . Hypertension   . Stroke Right Side Weakness  . CHF (congestive heart failure)     2D ECHO, 09/13/2011 - EF 40-45%, normal  . 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 Pain in limb     BILATERAL EXTREMITY VENOUS DUPLEX, 01/08/2011 - no evidence of deep vein or superficial thrombosis or Baker's cyst    Past Surgical History  Procedure Laterality Date  . Cardiac surgery    . Cardiac catheterization  10/27/2004    3 stents placed, 3.5x28 proximally,3.5x33 mid segment, and 3.5x33 in the region of the crux, stenosis being reduced to 0% at proximal and mid segment and being reduced from 80% ot 10% in the stent at the cruxed segment  . Cardiac catheterization  09/26/2004    High grade 95% ostial stenosis in the RCA with 70% mid stenosis, 95% distal stenosis and total occlusion of the PDA with collaterals  . Coronary artery bypass graft  02/24/1999    x4; IMA to distal  LAD, left radial to first circ, SVG to first diagonal, SVG to posterior descending coronary artery    Allergies  Allergen Reactions  . Penicillins Itching and Swelling    Current Outpatient Prescriptions  Medication Sig Dispense Refill  . acetaminophen (TYLENOL) 500 MG tablet Take 1,000 mg by mouth every 6 (six) hours as needed. pain       . allopurinol (ZYLOPRIM) 100 MG tablet Take 100 mg by mouth daily.      Marland Kitchen atorvastatin (LIPITOR) 20 MG tablet Take  1 tablet (20 mg total) by mouth daily.  30 tablet  5  . clopidogrel (PLAVIX) 75 MG tablet TAKE ONE TABLET BY MOUTH ONCE DAILY  30 tablet  6  . colchicine 0.6 MG tablet Take 0.6 mg by mouth daily.       . Cranberry-Vitamin C-Probiotic (AZO CRANBERRY PO) Take 1 capsule by mouth 2 (two) times daily.      Marland Kitchen ezetimibe-simvastatin (VYTORIN) 10-20 MG per tablet Take 1 tablet by mouth at bedtime.  28 tablet  0  . folic acid (FOLVITE) 1 MG tablet TAKE ONE TABLET BY MOUTH EVERY DAY  30 tablet  5  . isosorbide mononitrate (IMDUR) 60 MG 24 hr tablet 1 tablet daily  30 tablet  3  . lisinopril (PRINIVIL,ZESTRIL) 2.5 MG tablet TAKE ONE TABLET BY MOUTH EVERY DAY  30 tablet  11  . metoprolol (LOPRESSOR) 50 MG tablet Take 1 tablet (50 mg total) by mouth 2 (two) times daily.  60 tablet  3  . NITROSTAT 0.4 MG SL tablet Place 0.4 mg under the tongue as needed.      Marland Kitchen omega-3 acid ethyl esters (LOVAZA) 1 G capsule Take 2 capsules (2 g total) by mouth 2 (two) times daily.  120 capsule  6  . Phenazopyridine HCl (AZO TABS PO) Take 1 tablet by mouth 2 (two) times daily.      . polysaccharide iron (NIFEREX) 150 MG CAPS capsule Take 150 mg by mouth daily.       . primidone (MYSOLINE) 50 MG tablet Take 50 mg by mouth daily.        . ranitidine (ZANTAC) 150 MG capsule Take 150 mg by mouth every evening.      . senna (SENOKOT) 8.6 MG tablet Take 1 tablet by mouth 2 (two) times daily.        Marland Kitchen torsemide (DEMADEX) 20 MG tablet Take 1 tablet (20 mg total) by mouth 2 (two) times daily.  60 tablet  3  . JANUVIA 50 MG tablet Take 50 mg by mouth daily.      . pantoprazole (PROTONIX) 40 MG tablet Take 1 tablet (40 mg total) by mouth daily.  30 tablet  3  . [DISCONTINUED] pregabalin (LYRICA) 75 MG capsule Take 75 mg by mouth daily.         No current facility-administered medications for this visit.    Socially she is married and has 6 children 10 grandchildren 2 great-grandchildren. She is originally from [place].  ROS General:  Negative; No fevers, chills, or night sweats; positive for obesity. HEENT: Negative; No changes in vision or hearing, sinus congestion, difficulty swallowing Pulmonary: Negative; No cough, wheezing, shortness of breath, hemoptysis Cardiovascular: See history of present illness;  positive for occasional palpitations;No chest pain, presyncope, syncope,  Positive for leg swelling GI: Positive for GERD No nausea, vomiting, diarrhea, or abdominal pain GU: Negative; No dysuria, hematuria, or difficulty voiding Musculoskeletal: Positive for history of gout.; no myalgias, joint pain, or  weakness Hematologic/Oncology: Negative; no easy bruising, bleeding Endocrine: Positive for diabetes mellitus; no heat/cold intolerance;  Neuro: Negative; no changes in balance, headaches Skin: Negative; No rashes or skin lesions Psychiatric: Negative; No behavioral problems, depression Sleep: Negative; No snoring, daytime sleepiness, hypersomnolence, bruxism, restless legs, hypnogognic hallucinations, no cataplexy Other comprehensive 14 point system review is negative.  PE BP 140/80  Pulse 87  Ht 5\' 3"  (1.6 m)  General: Alert, oriented, no distress.  Skin: normal turgor, no rashes HEENT: Normocephalic, atraumatic. Pupils round and reactive; sclera anicteric;no lid lag.  Nose without nasal septal hypertrophy Mouth/Parynx benign; Mallinpatti scale 3 Neck: No JVD, no carotid bruits Lungs: clear to ausculatation and percussion; no wheezing or rales Heart: RRR, s1 s2 normal 1/6 systolic murmur; no diastolic murmur.  No rubs, thrills or heaves Abdomen: Positive for obesity soft, nontender; no hepatosplenomehaly, BS+; abdominal aorta nontender and not dilated by palpation. Pulses 2+ Extremities: Positive for 1+ leg edema clubbing cyanosis or edema, Homan's sign negative  Neurologic: grossly nonfocal Psychological normal affect.  Very pleasant.  Decreased short-term memory.  ECG (independently read by me): Sinus  rhythm with occasional PVC with a ventricular couplet.  Interventricular conduction block.  Prior August 2014 ECG: Sinus rhythm at 77 beats per minute with occasional PVC. ST-T changes inferolaterally. She has a small Q wave in the lead 3.  LABS:  BMET    Component Value Date/Time   NA 139 04/12/2014 1409   K 3.8 04/12/2014 1409   CL 96 04/12/2014 1409   CO2 28 04/12/2014 1409   GLUCOSE 120* 04/12/2014 1409   BUN 24* 04/12/2014 1409   CREATININE 0.87 04/12/2014 1409   CREATININE 0.68 12/03/2011 1420   CALCIUM 8.7 04/12/2014 1409   GFRNONAA 48* 10/04/2011 1545   GFRAA 55* 10/04/2011 1545     Hepatic Function Panel     Component Value Date/Time   PROT 6.2 04/12/2014 1425   ALBUMIN 3.5 04/12/2014 1425   AST 26 04/12/2014 1425   ALT 20 04/12/2014 1425   ALKPHOS 87 04/12/2014 1425   BILITOT 0.4 04/12/2014 1425   BILIDIR 0.1 04/12/2014 1425   IBILI 0.3 04/12/2014 1425     CBC    Component Value Date/Time   WBC 6.1 04/12/2014 1412   WBC 5.6 03/08/2014 1228   RBC 4.16 04/12/2014 1412   RBC 4.34 03/08/2014 1228   RBC 3.00* 01/08/2011 0625   HGB 10.7* 04/12/2014 1412   HGB 10.9* 03/08/2014 1228   HCT 35.2* 04/12/2014 1412   HCT 35.8 03/08/2014 1228   PLT 161 04/12/2014 1412   PLT 120* 03/08/2014 1228   MCV 84.6 04/12/2014 1412   MCV 82.5 03/08/2014 1228   MCH 25.7* 04/12/2014 1412   MCH 25.1 03/08/2014 1228   MCHC 30.4 04/12/2014 1412   MCHC 30.4* 03/08/2014 1228   RDW 18.7* 04/12/2014 1412   RDW 17.0* 03/08/2014 1228   LYMPHSABS 1.2 03/08/2014 1228   LYMPHSABS 1.6 10/04/2011 1545   MONOABS 0.4 03/08/2014 1228   MONOABS 0.7 10/04/2011 1545   EOSABS 0.3 03/08/2014 1228   EOSABS 0.3 10/04/2011 1545   BASOSABS 0.0 03/08/2014 1228   BASOSABS 0.0 10/04/2011 1545     BNP    Component Value Date/Time   PROBNP 282.7 09/26/2011 2234    Lipid Panel     Component Value Date/Time   CHOL 182 04/12/2014 1414   TRIG 199* 04/12/2014 1414   HDL 39* 04/12/2014 1414   CHOLHDL 4.7 04/12/2014 1414   VLDL 40  04/12/2014 1414   LDLCALC 103* 04/12/2014 1414     RADIOLOGY: No results found.    ASSESSMENT AND PLAN: Mrs. Ezzie DuralZaitawi is 15 years status post CBG surgery and 9 years status post stenting of the vein graft to the RCA.  She has a documented occlusion of her distal RCA with left to right collaterals.  Does note occasional episodes of chest pain.  However, these have improved on her isosorbide mononitrate at 60 mg dosing.  She does have some GERD-like symptoms.  She has been taking Zantac.  I am changing this to Protonix.  I'm also further titrating her metoprolol to 50 mg twice a day for ectopy suppression and continued optimal blood pressure control.  Her med list I believe is inaccurate and shows her both on Lipitor 20 mg, as well as Vytorin 10/20.  She will only need to be on one of these and this will need to be determined depending upon which medication she has at home.  She apparently was unable to have the Doppler studies, which were scheduled by Dr. freed to return our office today.  I will schedule these lower extremity studies to be done at [place], and we'll schedule her both for arterial as well as venous Doppler assessment.  I will see her in 3 months for followup evaluation.  Laboratory was checked today and will contact her regarding the results if adjustments need to be made in her medical regimen.   Lennette Biharihomas A. Levante Simones, MD, [facility]  04/17/2014 9:36 AM

## 2014-04-22 ENCOUNTER — Other Ambulatory Visit: Payer: Self-pay

## 2014-04-22 MED ORDER — ISOSORBIDE MONONITRATE ER 60 MG PO TB24: 60.0000 mg | ORAL_TABLET | Freq: Every day | ORAL | Status: DC

## 2014-04-22 NOTE — Telephone Encounter (Signed)
Rx was sent to pharmacy electronically. 

## 2014-04-28 ENCOUNTER — Other Ambulatory Visit (HOSPITAL_COMMUNITY): Payer: Self-pay | Admitting: Cardiology

## 2014-04-28 DIAGNOSIS — E1159 Type 2 diabetes mellitus with other circulatory complications: Secondary | ICD-10-CM

## 2014-04-28 DIAGNOSIS — R230 Cyanosis: Secondary | ICD-10-CM

## 2014-04-30 ENCOUNTER — Ambulatory Visit (HOSPITAL_COMMUNITY): Payer: Medicare Other | Attending: Internal Medicine | Admitting: Radiology

## 2014-04-30 DIAGNOSIS — I2510 Atherosclerotic heart disease of native coronary artery without angina pectoris: Secondary | ICD-10-CM | POA: Diagnosis not present

## 2014-04-30 DIAGNOSIS — Z8673 Personal history of transient ischemic attack (TIA), and cerebral infarction without residual deficits: Secondary | ICD-10-CM | POA: Insufficient documentation

## 2014-04-30 DIAGNOSIS — I10 Essential (primary) hypertension: Secondary | ICD-10-CM | POA: Insufficient documentation

## 2014-04-30 DIAGNOSIS — E1159 Type 2 diabetes mellitus with other circulatory complications: Secondary | ICD-10-CM | POA: Diagnosis not present

## 2014-04-30 DIAGNOSIS — L97909 Non-pressure chronic ulcer of unspecified part of unspecified lower leg with unspecified severity: Secondary | ICD-10-CM | POA: Diagnosis not present

## 2014-04-30 DIAGNOSIS — R230 Cyanosis: Secondary | ICD-10-CM | POA: Diagnosis not present

## 2014-04-30 NOTE — Progress Notes (Signed)
Lower extremity arterial Doppler performed. 

## 2014-05-03 ENCOUNTER — Other Ambulatory Visit: Payer: Self-pay | Admitting: *Deleted

## 2014-05-03 MED ORDER — ATORVASTATIN CALCIUM 40 MG PO TABS
40.0000 mg | ORAL_TABLET | Freq: Every day | ORAL | Status: DC
Start: 2014-05-03 — End: 2014-07-14

## 2014-05-18 ENCOUNTER — Other Ambulatory Visit: Payer: Self-pay | Admitting: Cardiovascular Disease

## 2014-05-19 NOTE — Telephone Encounter (Signed)
Rx was sent to pharmacy electronically. 

## 2014-06-20 ENCOUNTER — Other Ambulatory Visit: Payer: Self-pay | Admitting: Cardiovascular Disease

## 2014-06-21 NOTE — Telephone Encounter (Signed)
Rx was sent to pharmacy electronically. 

## 2014-07-14 ENCOUNTER — Ambulatory Visit (INDEPENDENT_AMBULATORY_CARE_PROVIDER_SITE_OTHER): Payer: Medicare Other | Admitting: Cardiovascular Disease

## 2014-07-14 VITALS — BP 140/78 | HR 62 | Ht 62.0 in | Wt 200.0 lb

## 2014-07-14 DIAGNOSIS — K219 Gastro-esophageal reflux disease without esophagitis: Secondary | ICD-10-CM

## 2014-07-14 DIAGNOSIS — I25119 Atherosclerotic heart disease of native coronary artery with unspecified angina pectoris: Secondary | ICD-10-CM

## 2014-07-14 DIAGNOSIS — I10 Essential (primary) hypertension: Secondary | ICD-10-CM

## 2014-07-14 DIAGNOSIS — E785 Hyperlipidemia, unspecified: Secondary | ICD-10-CM

## 2014-07-14 DIAGNOSIS — E113319 Type 2 diabetes mellitus with moderate nonproliferative diabetic retinopathy with macular edema, unspecified eye: Secondary | ICD-10-CM

## 2014-07-14 DIAGNOSIS — E11331 Type 2 diabetes mellitus with moderate nonproliferative diabetic retinopathy with macular edema: Secondary | ICD-10-CM

## 2014-07-14 DIAGNOSIS — I2510 Atherosclerotic heart disease of native coronary artery without angina pectoris: Secondary | ICD-10-CM

## 2014-07-14 MED ORDER — OMEGA-3-ACID ETHYL ESTERS 1 G PO CAPS: ORAL_CAPSULE | ORAL | Status: DC

## 2014-07-14 MED ORDER — TORSEMIDE 20 MG PO TABS: 20.0000 mg | ORAL_TABLET | Freq: Two times a day (BID) | ORAL | Status: DC

## 2014-07-14 MED ORDER — CLOPIDOGREL BISULFATE 75 MG PO TABS: ORAL_TABLET | ORAL | Status: DC

## 2014-07-14 MED ORDER — LISINOPRIL 2.5 MG PO TABS: ORAL_TABLET | ORAL | Status: DC

## 2014-07-14 MED ORDER — ATORVASTATIN CALCIUM 40 MG PO TABS: 40.0000 mg | ORAL_TABLET | Freq: Every day | ORAL | Status: DC

## 2014-07-14 MED ORDER — FOLIC ACID 1 MG PO TABS: ORAL_TABLET | ORAL | Status: DC

## 2014-07-14 MED ORDER — METOPROLOL TARTRATE 50 MG PO TABS: 50.0000 mg | ORAL_TABLET | Freq: Two times a day (BID) | ORAL | Status: DC

## 2014-07-14 NOTE — Patient Instructions (Signed)
Your physician has recommended you make the following change in your medication: increase the isosorbide to 90 mg daily. ( 1 & 1/2 tablet).   Your physician wants you to follow-up in: 6 months or sooner if needed with Dr. Tresa EndoKelly. You will receive a reminder letter in the mail two months in advance. If you don't receive a letter, please call our office to schedule the follow-up appointment.

## 2014-07-16 ENCOUNTER — Encounter: Payer: Self-pay | Admitting: Cardiovascular Disease

## 2014-07-16 DIAGNOSIS — I10 Essential (primary) hypertension: Secondary | ICD-10-CM | POA: Insufficient documentation

## 2014-07-16 DIAGNOSIS — E785 Hyperlipidemia, unspecified: Secondary | ICD-10-CM | POA: Insufficient documentation

## 2014-07-16 NOTE — Progress Notes (Signed)
Patient ID: [name], female   DOB: March 19, 1931, 80 y.o.   MRN: [id number]     HPI: [name] is a 80 y.o. female who presents to the office today for 5 month cardiology evaluation.   [name] has known coronary artery disease and underwent CABG revascularization surgery in 2000. In January 2006 she was found to have subtotal occlusion of the vein graft to RCA and underwent stenting with resumption of normal flow. She has known distal occlusion of her PDA/PLA-like vessel with collaterals from left to right she has an atretic LIMA but patent vein graft to diagonal which fills the LAD. She has had issues with some intermittent episodes of chest pain for which she has been on nitrate therapy. In November 2013  A nuclear study suggested a small reversible defect involving the lateral inferior wall. Ejection fraction was 65%. She does have issues with some lower extremity edema.  [name] admits to occasional occasional episodes of sharp pain for which she has taken sublingual nitroglycerin with some benefit. She still notes some intermittent leg swelling right leg greater than left. I previously recommended slight additional titration of her isosorbide mononitrate from 30 mg to 45 mg daily and ultimately to 60 mg daily. This seems somewhat to help her chest pain. However, according to the daughter, she still has experienced several episodes of recurrent chest pain pain for which he does take sublingual nitroglycerin. Upon further questioning, the chest pain is more sharp fleeting rather than tightness and pressure but she does believe that sublingual nitroglycerin may help.   When I last saw her in July 2015 she was to undergo lower extremityDoppler studies.  However, she was unable to get out of the wheelchair and was unable to get on the stretcher for the procedure.  Consequently, this was unable to be performed in the office setting.  She subsequently was referred for this to be done  in the hospital setting.  Her ABIs were felt to be falsely elevated due to medial calcification.  Waveforms suggest tibial disease bilaterally.  She had spontaneous venous flow noted throughout each level.  She abnormal great toe brachial index bilaterally, but it was felt to be adequate for tissue healing.  Recently, she has continued to experience some short-lived, nitrate responsive chest pain.  Her lower extremity edema has improved.  She's had some difficulty with short-term memory.  She also notes GERD symptoms.  Most of the time she is in a wheelchair and cannot walk by herself appear She denies bleeding, and continues antiplatelet therapy with Plavix.  Past Medical History  Diagnosis Date  . Diabetes mellitus   . Hypertension   . Stroke Right Side Weakness  . CHF (congestive heart failure)     2D ECHO, 09/13/2011 - EF 40-45%, normal  . 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 Pain in limb     BILATERAL EXTREMITY VENOUS DUPLEX, 01/08/2011 - no evidence of deep vein or superficial thrombosis or Baker's cyst    Past Surgical History  Procedure Laterality Date  . Cardiac surgery    . Cardiac catheterization  10/27/2004    3 stents placed, 3.5x28 proximally,3.5x33 mid segment, and 3.5x33 in the region of the crux, stenosis being reduced to 0% at proximal and mid segment and being reduced from 80% ot 10% in the stent at  the cruxed segment  . Cardiac catheterization  09/26/2004    High grade 95% ostial stenosis in the RCA with 70% mid stenosis, 95% distal stenosis and total occlusion of the PDA with collaterals  . Coronary artery bypass graft  02/24/1999    x4; IMA to distal LAD, left radial to first circ, SVG to first diagonal, SVG to posterior descending coronary artery    Allergies  Allergen Reactions  . Penicillins Itching and Swelling     Current Outpatient Prescriptions  Medication Sig Dispense Refill  . acetaminophen (TYLENOL) 500 MG tablet Take 1,000 mg by mouth every 6 (six) hours as needed. pain       . allopurinol (ZYLOPRIM) 100 MG tablet Take 100 mg by mouth daily.      Marland Kitchen. atorvastatin (LIPITOR) 40 MG tablet Take 1 tablet (40 mg total) by mouth daily.  30 tablet  6  . clopidogrel (PLAVIX) 75 MG tablet TAKE ONE TABLET BY MOUTH ONCE DAILY  30 tablet  6  . colchicine 0.6 MG tablet Take 0.6 mg by mouth daily.       . Cranberry-Vitamin C-Probiotic (AZO CRANBERRY PO) Take 1 capsule by mouth 2 (two) times daily.      . folic acid (FOLVITE) 1 MG tablet TAKE ONE TABLET BY MOUTH ONCE DAILY  30 tablet  10  . isosorbide mononitrate (IMDUR) 60 MG 24 hr tablet Take 1 tablet (60 mg total) by mouth daily. 1 tablet daily  30 tablet  11  . JANUVIA 50 MG tablet Take 50 mg by mouth daily.      Marland Kitchen. lisinopril (PRINIVIL,ZESTRIL) 2.5 MG tablet TAKE ONE TABLET BY MOUTH ONCE DAILY  30 tablet  10  . metoprolol (LOPRESSOR) 50 MG tablet Take 1 tablet (50 mg total) by mouth 2 (two) times daily.  60 tablet  3  . NITROSTAT 0.4 MG SL tablet Place 0.4 mg under the tongue as needed.      Marland Kitchen. omega-3 acid ethyl esters (LOVAZA) 1 G capsule TAKE TWO CAPSULES BY MOUTH TWICE DAILY  120 capsule  10  . pantoprazole (PROTONIX) 40 MG tablet Take 1 tablet (40 mg total) by mouth daily.  30 tablet  3  . Phenazopyridine HCl (AZO TABS PO) Take 1 tablet by mouth 2 (two) times daily.      . polysaccharide iron (NIFEREX) 150 MG CAPS capsule Take 150 mg by mouth daily.       . primidone (MYSOLINE) 50 MG tablet Take 50 mg by mouth daily.        . ranitidine (ZANTAC) 150 MG capsule Take 150 mg by mouth every evening.      . senna (SENOKOT) 8.6 MG tablet Take 1 tablet by mouth 2 (two) times daily.        Marland Kitchen. torsemide (DEMADEX) 20 MG tablet Take 1 tablet (20 mg total) by mouth 2 (two) times daily.  60 tablet  3  . [DISCONTINUED] pregabalin (LYRICA) 75 MG capsule Take 75 mg by  mouth daily.         No current facility-administered medications for this visit.    Socially she is married and has 6 children 10 grandchildren 2 great-grandchildren. She is originally from [place].  ROS General: Negative; No fevers, chills, or night sweats; positive for obesity. HEENT: Negative; No changes in vision or hearing, sinus congestion, difficulty swallowing Pulmonary: She has home O2; No cough, wheezing, shortness of breath, hemoptysis Cardiovascular: See history of present illness;  positive for occasional palpitations;No chest pain,  presyncope, syncope,  Positive for intermittent leg swelling GI: Positive for GERD No nausea, vomiting, diarrhea, or abdominal pain GU: Negative; No dysuria, hematuria, or difficulty voiding Musculoskeletal: Positive for history of gout.; no myalgias, joint pain, or weakness Hematologic/Oncology: Negative; no easy bruising, bleeding Endocrine: Positive for diabetes mellitus; no heat/cold intolerance;  Neuro: Negative; no changes in balance, headaches Skin: Negative; No rashes or skin lesions Psychiatric: Negative; No behavioral problems, depression Sleep: Negative; No snoring, daytime sleepiness, hypersomnolence, bruxism, restless legs, hypnogognic hallucinations, no cataplexy Other comprehensive 14 point system review is negative.  PE BP 140/78  Pulse 62  Ht 5\' 2"  (1.575 m)  Wt 200 lb (90.719 kg)  BMI 36.57 kg/m2  General: Alert, oriented, no distress.  Skin: normal turgor, no rashes HEENT: Normocephalic, atraumatic. Pupils round and reactive; sclera anicteric;no lid lag.  Nose without nasal septal hypertrophy Mouth/Parynx benign; Mallinpatti scale 3 Neck: No JVD, no carotid bruits; normal upstroke Lungs: clear to ausculatation and percussion; no wheezing or rales Heart: RRR, s1 s2 normal 1/6 systolic murmur; no diastolic murmur.  No rubs, thrills or heaves Abdomen: Positive for obesity soft, nontender; no hepatosplenomehaly, BS+;  abdominal aorta nontender and not dilated by palpation. Pulses 2+ Extremities: Improvement in previous leg edema edema clubbing cyanosis or edema, Homan's sign negative  Neurologic: grossly nonfocal Psychological normal affect.  Very pleasant.  Decreased short-term memory.  ECG (independently read by me): Normal sinus rhythm at 62 beats per minute.  Left bundle branch block.  No significant ST changes  July 2015 (independently read by me): Sinus rhythm with occasional PVC with a ventricular couplet.  Interventricular conduction block.  Prior August 2014 ECG: Sinus rhythm at 77 beats per minute with occasional PVC. ST-T changes inferolaterally. She has a small Q wave in the lead 3.  LABS:  BMET    Component Value Date/Time   NA 139 04/12/2014 1409   K 3.8 04/12/2014 1409   CL 96 04/12/2014 1409   CO2 28 04/12/2014 1409   GLUCOSE 120* 04/12/2014 1409   BUN 24* 04/12/2014 1409   CREATININE 0.87 04/12/2014 1409   CREATININE 0.68 12/03/2011 1420   CALCIUM 8.7 04/12/2014 1409   GFRNONAA 48* 10/04/2011 1545   GFRAA 55* 10/04/2011 1545     Hepatic Function Panel     Component Value Date/Time   PROT 6.2 04/12/2014 1425   ALBUMIN 3.5 04/12/2014 1425   AST 26 04/12/2014 1425   ALT 20 04/12/2014 1425   ALKPHOS 87 04/12/2014 1425   BILITOT 0.4 04/12/2014 1425   BILIDIR 0.1 04/12/2014 1425   IBILI 0.3 04/12/2014 1425     CBC    Component Value Date/Time   WBC 6.1 04/12/2014 1412   WBC 5.6 03/08/2014 1228   RBC 4.16 04/12/2014 1412   RBC 4.34 03/08/2014 1228   RBC 3.00* 01/08/2011 0625   HGB 10.7* 04/12/2014 1412   HGB 10.9* 03/08/2014 1228   HCT 35.2* 04/12/2014 1412   HCT 35.8 03/08/2014 1228   PLT 161 04/12/2014 1412   PLT 120* 03/08/2014 1228   MCV 84.6 04/12/2014 1412   MCV 82.5 03/08/2014 1228   MCH 25.7* 04/12/2014 1412   MCH 25.1 03/08/2014 1228   MCHC 30.4 04/12/2014 1412   MCHC 30.4* 03/08/2014 1228   RDW 18.7* 04/12/2014 1412   RDW 17.0* 03/08/2014 1228   LYMPHSABS 1.2 03/08/2014 1228    LYMPHSABS 1.6 10/04/2011 1545   MONOABS 0.4 03/08/2014 1228   MONOABS 0.7 10/04/2011 1545   EOSABS 0.3  03/08/2014 1228   EOSABS 0.3 10/04/2011 1545   BASOSABS 0.0 03/08/2014 1228   BASOSABS 0.0 10/04/2011 1545     BNP    Component Value Date/Time   PROBNP 282.7 09/26/2011 2234    Lipid Panel     Component Value Date/Time   CHOL 182 04/12/2014 1414   TRIG 199* 04/12/2014 1414   HDL 39* 04/12/2014 1414   CHOLHDL 4.7 04/12/2014 1414   VLDL 40 04/12/2014 1414   LDLCALC 103* 04/12/2014 1414     RADIOLOGY: No results found.    ASSESSMENT AND PLAN: Mrs. Ezzie DuralZaitawi is 15 years status post CBG surgery and 9 years status post stenting of the vein graft to the RCA.  She has a documented occlusion of her distal RCA with left to right collaterals.  She has continued to experience mild twinges of chest pain and these have improved with titration of her isosorbide, mononitrate 60 mg.  I will now for further titrated his to 90 mg daily.  She denies any significant shortness of breath.  She denies PND, orthopnea.  Her chest pain is not with activity, but she is not very active. Her GERD-like symptoms have improved with the change from Zantac to Protonix at her last office visit.  She is tolerating the increased dose of Lopressor at 50 mg twice a day, and denies any recent ectopy.  She continues to be on atorvastatin 40 mg for hyperlipidemia.  Her blood pressure today is controlled with lisinopril 2.5 mg and her metoprolol 50 mg twice a day.  Today.  She also takes torsemide 20 mg twice a day.  Her edema has improved.  She continues to have hand tremors.  If her tremors become more progressive, neurologic evaluation would be recommended.  I will see her in 6 months for cardiology reevaluation or sooner if problems arise.  Lennette Biharihomas A. Kelly, MD, [facility] Hospital[facility]ACC  07/16/2014 3:05 PM

## 2014-08-06 ENCOUNTER — Telehealth: Payer: Self-pay | Admitting: Oncology

## 2014-08-06 NOTE — Telephone Encounter (Signed)
S/w pt advised appt time chg on 11/05/14 from 3.15 to 11.15am due to GI MDC. pt verbalized understanding.

## 2014-08-10 ENCOUNTER — Other Ambulatory Visit: Payer: Self-pay | Admitting: Cardiovascular Disease

## 2014-08-11 NOTE — Telephone Encounter (Signed)
Rx refill sent to patient pharmacy   

## 2014-10-20 ENCOUNTER — Telehealth: Payer: Self-pay | Admitting: Oncology

## 2014-10-20 NOTE — Telephone Encounter (Signed)
Pt's daughter called to r/s apt due to pt can't do mornings needs later afternoon, r/s, confirmed D/T and mailed out sch.... Doristine ChurchKJ

## 2014-11-05 ENCOUNTER — Other Ambulatory Visit: Payer: Medicare Other

## 2014-11-05 ENCOUNTER — Ambulatory Visit: Payer: Medicare Other | Admitting: Oncology

## 2014-11-10 ENCOUNTER — Telehealth: Payer: Self-pay | Admitting: Nurse Practitioner

## 2014-11-10 NOTE — Telephone Encounter (Signed)
Pt's daughter called lft msg about pt is sick and needed to r/s this week but looking at schedule pt is scheduled for next wk, I called back and lft msg for pt and daughter that pt's apt isn't until 02/25 and if she still needed to r/s to call us back....  KJ

## 2014-11-12 ENCOUNTER — Telehealth: Payer: Self-pay | Admitting: Nurse Practitioner

## 2014-11-12 NOTE — Telephone Encounter (Signed)
Pt's daughter called to r/s due to she has to carry pat and her dad has a scheduled apt elsewhere and she takes them both... KJ

## 2014-11-18 ENCOUNTER — Ambulatory Visit: Payer: Medicare Other | Admitting: Nurse Practitioner

## 2014-11-18 ENCOUNTER — Other Ambulatory Visit: Payer: Medicare Other

## 2014-11-30 ENCOUNTER — Other Ambulatory Visit (HOSPITAL_BASED_OUTPATIENT_CLINIC_OR_DEPARTMENT_OTHER): Payer: Medicare Other

## 2014-11-30 ENCOUNTER — Telehealth: Payer: Self-pay | Admitting: Nurse Practitioner

## 2014-11-30 ENCOUNTER — Ambulatory Visit (HOSPITAL_BASED_OUTPATIENT_CLINIC_OR_DEPARTMENT_OTHER): Payer: Medicare Other | Admitting: Nurse Practitioner

## 2014-11-30 VITALS — BP 146/53 | HR 68 | Temp 97.7°F | Resp 18 | Ht 62.0 in

## 2014-11-30 DIAGNOSIS — D696 Thrombocytopenia, unspecified: Secondary | ICD-10-CM

## 2014-11-30 DIAGNOSIS — D649 Anemia, unspecified: Secondary | ICD-10-CM | POA: Diagnosis not present

## 2014-11-30 LAB — CBC WITH DIFFERENTIAL/PLATELET
BASO%: 0.7 % (ref 0.0–2.0)
Basophils Absolute: 0.1 10*3/uL (ref 0.0–0.1)
EOS ABS: 0.3 10*3/uL (ref 0.0–0.5)
EOS%: 3 % (ref 0.0–7.0)
HCT: 35.5 % (ref 34.8–46.6)
HEMOGLOBIN: 10.8 g/dL — AB (ref 11.6–15.9)
LYMPH#: 1.7 10*3/uL (ref 0.9–3.3)
LYMPH%: 20.2 % (ref 14.0–49.7)
MCH: 27.3 pg (ref 25.1–34.0)
MCHC: 30.4 g/dL — ABNORMAL LOW (ref 31.5–36.0)
MCV: 89.9 fL (ref 79.5–101.0)
MONO#: 0.8 10*3/uL (ref 0.1–0.9)
MONO%: 9.6 % (ref 0.0–14.0)
NEUT%: 66.5 % (ref 38.4–76.8)
NEUTROS ABS: 5.5 10*3/uL (ref 1.5–6.5)
PLATELETS: 117 10*3/uL — AB (ref 145–400)
RBC: 3.95 10*6/uL (ref 3.70–5.45)
RDW: 19.8 % — ABNORMAL HIGH (ref 11.2–14.5)
WBC: 8.2 10*3/uL (ref 3.9–10.3)
nRBC: 0 % (ref 0–0)

## 2014-11-30 NOTE — Telephone Encounter (Signed)
Pt confirmed labs/ov per 03/08 POF, gave pt AVS..... KJ °

## 2014-11-30 NOTE — Progress Notes (Signed)
  [facility] OFFICE PROGRESS NOTE   Diagnosis:  Anemia/thrombocytopenia  INTERVAL HISTORY:   [name] returns for scheduled follow-up. She is accompanied by her daughters. They report she is essentially unchanged. Appetite varies. She continues to have occasional episodes of "choking". No interim illnesses or infections. No fever. She continues supplemental oxygen at 3 L/m.  Objective:  Vital signs in last 24 hours:  Blood pressure 146/53, pulse 68, temperature 97.7 F (36.5 C), temperature source Oral, resp. rate 18, height 5\' 2"  (1.575 m), weight 0 lb (0 kg), SpO2 100 %.    HEENT: No thrush or ulcers. Lymphatics: No palpable cervical, supraclavicular or axillary lymph nodes. Resp: Lungs clear bilaterally. Cardio: Regular rate and rhythm. GI: Abdomen soft and nontender. Vascular: Trace edema at the lower legs bilaterally right greater than left. Neuro: Alert. Right hand tremor.    Lab Results:  Lab Results  Component Value Date   WBC 8.2 11/30/2014   HGB 10.8* 11/30/2014   HCT 35.5 11/30/2014   MCV 89.9 11/30/2014   PLT 117* 11/30/2014   NEUTROABS 5.5 11/30/2014    Imaging:  No results found.  Medications: I have reviewed the patient's current medications.  Assessment/Plan: 1. History of a microcytic anemia. The MCV remains in the normal range. The hemoglobin is stable.  2. History of coronary artery disease.  3. "Gout."  4. "Giant" platelets on the blood smear from 01/29/2011.  5. 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 She is unable to ambulate.  6. History of a zoster rash at the left lower back and chest.  7. Admission with pneumonia and a CHF exacerbation in December of 2012. 8. Mild thrombocytopenia. Stable.   Disposition: She remains stable from a hematologic standpoint. She will return for a follow-up visit and CBC in 8 months.   Plan reviewed with Dr. Truett PernaSherrill.   [name], [name] ANP/GNP-BC   11/30/2014  4:09  PM

## 2014-12-30 ENCOUNTER — Other Ambulatory Visit: Payer: Self-pay | Admitting: Cardiovascular Disease

## 2015-02-19 LAB — BASIC METABOLIC PANEL
BUN: 26 mg/dL — AB (ref 4–21)
CREATININE: 0.8 mg/dL (ref <=1.1)
Glucose: 98 mg/dL
Sodium: 138 mmol/L (ref 137–147)

## 2015-03-10 ENCOUNTER — Other Ambulatory Visit: Payer: Self-pay | Admitting: Cardiovascular Disease

## 2015-03-10 NOTE — Telephone Encounter (Signed)
Rx(s) sent to pharmacy electronically.  

## 2015-03-27 ENCOUNTER — Other Ambulatory Visit: Payer: Self-pay | Admitting: Cardiovascular Disease

## 2015-03-29 NOTE — Telephone Encounter (Signed)
Rx(s) sent to pharmacy electronically.  

## 2015-04-24 ENCOUNTER — Other Ambulatory Visit: Payer: Self-pay | Admitting: Cardiovascular Disease

## 2015-04-25 NOTE — Telephone Encounter (Signed)
REFILL 

## 2015-05-23 ENCOUNTER — Other Ambulatory Visit: Payer: Self-pay | Admitting: Cardiovascular Disease

## 2015-05-23 NOTE — Telephone Encounter (Signed)
Appointment needed with Dr. Tresa Endo in or after October

## 2015-06-07 ENCOUNTER — Other Ambulatory Visit: Payer: Self-pay | Admitting: Cardiovascular Disease

## 2015-06-25 ENCOUNTER — Other Ambulatory Visit: Payer: Self-pay | Admitting: Cardiovascular Disease

## 2015-06-27 NOTE — Telephone Encounter (Signed)
REFILL 

## 2015-06-29 ENCOUNTER — Other Ambulatory Visit: Payer: Self-pay | Admitting: Cardiovascular Disease

## 2015-06-29 NOTE — Telephone Encounter (Signed)
REFILL 

## 2015-07-04 ENCOUNTER — Other Ambulatory Visit: Payer: Self-pay | Admitting: Cardiovascular Disease

## 2015-07-15 ENCOUNTER — Other Ambulatory Visit: Payer: Self-pay | Admitting: Cardiovascular Disease

## 2015-07-21 ENCOUNTER — Ambulatory Visit: Payer: Medicare Other | Attending: Family Medicine | Admitting: Rehabilitative and Restorative Service Providers"

## 2015-07-21 DIAGNOSIS — R269 Unspecified abnormalities of gait and mobility: Secondary | ICD-10-CM

## 2015-07-21 NOTE — Therapy (Signed)
[facility] [address] [place], [place], [address] Phone: [phone]   Fax:  [phone]  Patient Details  Name: [name] MRN: [id number] Date of Birth: [birth year] Referring Provider:  Marinda ElkFried, Robert, MD  Encounter Date: n/a  A referral was received for a wheelchair evaluation for the above patient.  Her daughter showed up today and signed in wanting her father to be evaluated.  The front office let the patient know the evaluation order was for her mother.  She reported her mother does not currently need a new wheelchair and her father was with her to be evaluated.  She plans to f/u with MD office.   Thank you for the referral of this patient. Margretta Dittyhristina Tatiana Courter, MPT  [name] 07/21/2015, 10:34 AM  [facility] [address] [place], [place], [address] Phone: [phone]   Fax:  [phone]

## 2015-07-30 ENCOUNTER — Other Ambulatory Visit: Payer: Self-pay | Admitting: Cardiovascular Disease

## 2015-08-02 ENCOUNTER — Other Ambulatory Visit: Payer: Medicare Other

## 2015-08-02 ENCOUNTER — Telehealth: Payer: Self-pay | Admitting: Oncology

## 2015-08-02 ENCOUNTER — Ambulatory Visit: Payer: Medicare Other | Admitting: Oncology

## 2015-08-02 NOTE — Telephone Encounter (Signed)
Dtr called and r/s 11/8 appointments to 12/13. Dtr has new date/time.

## 2015-08-11 ENCOUNTER — Other Ambulatory Visit: Payer: Self-pay | Admitting: Cardiovascular Disease

## 2015-08-16 ENCOUNTER — Ambulatory Visit (INDEPENDENT_AMBULATORY_CARE_PROVIDER_SITE_OTHER): Payer: Medicare Other | Admitting: Cardiovascular Disease

## 2015-08-16 ENCOUNTER — Other Ambulatory Visit: Payer: Self-pay | Admitting: Cardiovascular Disease

## 2015-08-16 ENCOUNTER — Encounter: Payer: Self-pay | Admitting: Cardiovascular Disease

## 2015-08-16 VITALS — BP 124/60 | HR 61 | Ht 62.0 in | Wt 200.0 lb

## 2015-08-16 DIAGNOSIS — E785 Hyperlipidemia, unspecified: Secondary | ICD-10-CM

## 2015-08-16 DIAGNOSIS — I2583 Coronary atherosclerosis due to lipid rich plaque: Secondary | ICD-10-CM

## 2015-08-16 DIAGNOSIS — I10 Essential (primary) hypertension: Secondary | ICD-10-CM

## 2015-08-16 DIAGNOSIS — K219 Gastro-esophageal reflux disease without esophagitis: Secondary | ICD-10-CM | POA: Diagnosis not present

## 2015-08-16 DIAGNOSIS — R600 Localized edema: Secondary | ICD-10-CM

## 2015-08-16 DIAGNOSIS — I2510 Atherosclerotic heart disease of native coronary artery without angina pectoris: Secondary | ICD-10-CM | POA: Diagnosis not present

## 2015-08-16 DIAGNOSIS — D696 Thrombocytopenia, unspecified: Secondary | ICD-10-CM

## 2015-08-16 MED ORDER — METOPROLOL TARTRATE 50 MG PO TABS: 50.0000 mg | ORAL_TABLET | Freq: Two times a day (BID) | ORAL | Status: DC

## 2015-08-16 MED ORDER — CLOPIDOGREL BISULFATE 75 MG PO TABS: 75.0000 mg | ORAL_TABLET | Freq: Every day | ORAL | Status: DC

## 2015-08-16 MED ORDER — ISOSORBIDE MONONITRATE ER 60 MG PO TB24: 60.0000 mg | ORAL_TABLET | Freq: Every day | ORAL | Status: DC

## 2015-08-16 MED ORDER — OMEGA-3-ACID ETHYL ESTERS 1 G PO CAPS: ORAL_CAPSULE | ORAL | Status: DC

## 2015-08-16 MED ORDER — TORSEMIDE 20 MG PO TABS: 20.0000 mg | ORAL_TABLET | Freq: Two times a day (BID) | ORAL | Status: DC

## 2015-08-16 MED ORDER — NITROGLYCERIN 0.4 MG SL SUBL: 0.4000 mg | SUBLINGUAL_TABLET | SUBLINGUAL | Status: DC | PRN

## 2015-08-16 MED ORDER — LISINOPRIL 2.5 MG PO TABS: 2.5000 mg | ORAL_TABLET | Freq: Every day | ORAL | Status: DC

## 2015-08-16 MED ORDER — ATORVASTATIN CALCIUM 40 MG PO TABS: 40.0000 mg | ORAL_TABLET | Freq: Every day | ORAL | Status: DC

## 2015-08-16 MED ORDER — PANTOPRAZOLE SODIUM 40 MG PO TBEC: 40.0000 mg | DELAYED_RELEASE_TABLET | Freq: Every day | ORAL | Status: DC

## 2015-08-16 NOTE — Patient Instructions (Signed)
Your physician wants you to follow-up in: 1 year or sooner if needed. You will receive a reminder letter in the mail two months in advance. If you don't receive a letter, please call our office to schedule the follow-up appointment.   If you need a refill on your cardiac medications before your next appointment, please call your pharmacy.   

## 2015-08-16 NOTE — Progress Notes (Signed)
Patient ID: [name], female   DOB: [birth year], 81 y.o.   MRN: [id number] Patient ID: [name], female   DOB: [birth year], 81 y.o.   MRN: [id number]     HPI: [name] is a 80 y.o. female who presents to the office today for 5 month cardiology evaluation.  Ms Dimperio has known CAD and underwent CABG revascularization surgery in 2000. In January 2006 she was found to have subtotal occlusion of the vein graft to RCA and underwent stenting with resumption of normal flow. She has known distal occlusion of her PDA/PLA-like vessel with collaterals from left to right she has an atretic LIMA but patent vein graft to diagonal which fills the LAD. She has had issues with some intermittent episodes of chest pain for which she has been on nitrate therapy. In November 2013  A nuclear study suggested a small reversible defect involving the lateral inferior wall. Ejection fraction was 65%. She does have issues with some lower extremity edema.  Mrs. Christianson has rare episodes of sharp pain for which she has taken sublingual nitroglycerin with some benefit. She still notes some intermittent leg swelling right leg greater than left. I previously recommended slight additional titration of her isosorbide mononitrate from 30 mg to 45 mg daily and ultimately to 60 mg daily. This seems somewhat to help her chest pain.  Upon further questioning, the chest pain is more sharp fleeting rather than tightness and pressure but she does believe that sublingual nitroglycerin may help.   When I saw her in July 2015 she was to undergo lower extremity Doppler studies.  However, she was unable to get out of the wheelchair and was unable to get on the stretcher for the procedure.  Consequently, this was unable to be performed in the office setting.  She subsequently was referred for this to be done in the hospital setting.  Her ABIs were felt to be falsely elevated due to medial calcification.  Waveforms suggest tibial  disease bilaterally.  She had spontaneous venous flow noted throughout each level.  She abnormal great toe brachial index bilaterally, but it was felt to be adequate for tissue healing.  Over the past year, she has remained fairly stable.  She denies significant episodes of chest pain but continues to note rare sharp episodes.  Her lower extremity edema has improved.  She's had some difficulty with short-term memory.  She also notes GERD symptoms.  Most of the time she is in a wheelchair and cannot walk by herself appear She denies bleeding, and continues antiplatelet therapy with Plavix.  Past Medical History  Diagnosis Date  . Diabetes mellitus   . Hypertension   . Stroke [facility]) Right Side Weakness  . CHF (congestive heart failure) ([place])     2D ECHO, 09/13/2011 - EF 40-45%, normal  . 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 Pain in limb     BILATERAL EXTREMITY VENOUS DUPLEX, 01/08/2011 - no evidence of deep vein or superficial thrombosis or Baker's cyst    Past Surgical History  Procedure Laterality Date  . Cardiac surgery    . Cardiac catheterization  10/27/2004    3 stents placed, 3.5x28 proximally,3.5x33 mid segment, and 3.5x33 in the region of the crux, stenosis being reduced to 0% at proximal and mid segment and being reduced from 80% ot 10% in  the stent at the cruxed segment  . Cardiac catheterization  09/26/2004    High grade 95% ostial stenosis in the RCA with 70% mid stenosis, 95% distal stenosis and total occlusion of the PDA with collaterals  . Coronary artery bypass graft  02/24/1999    x4; IMA to distal LAD, left radial to first circ, SVG to first diagonal, SVG to posterior descending coronary artery    Allergies  Allergen Reactions  . Penicillins Itching and Swelling    Current Outpatient Prescriptions  Medication Sig Dispense  Refill  . acetaminophen (TYLENOL) 500 MG tablet Take 1,000 mg by mouth every 6 (six) hours as needed. pain     . allopurinol (ZYLOPRIM) 100 MG tablet Take 100 mg by mouth daily.    Marland Kitchen atorvastatin (LIPITOR) 40 MG tablet TAKE ONE TABLET BY MOUTH ONCE DAILY 30 tablet 2  . clopidogrel (PLAVIX) 75 MG tablet Take 1 tablet (75 mg total) by mouth daily. 30 tablet 4  . colchicine 0.6 MG tablet Take 0.6 mg by mouth daily.     . Cranberry-Vitamin C-Probiotic (AZO CRANBERRY PO) Take 1 capsule by mouth 2 (two) times daily.    . folic acid (FOLVITE) 1 MG tablet Take 1 tablet (1 mg total) by mouth daily. 30 tablet 0  . isosorbide mononitrate (IMDUR) 60 MG 24 hr tablet Take 1 tablet (60 mg total) by mouth daily. NEED OV. 15 tablet 0  . JANUVIA 50 MG tablet Take 50 mg by mouth daily.    Marland Kitchen lisinopril (PRINIVIL,ZESTRIL) 2.5 MG tablet Take 1 tablet (2.5 mg total) by mouth daily. 30 tablet 0  . metoprolol (LOPRESSOR) 50 MG tablet Take 1 tablet (50 mg total) by mouth 2 (two) times daily. 30 tablet 0  . NITROSTAT 0.4 MG SL tablet Place 0.4 mg under the tongue as needed.    Marland Kitchen omega-3 acid ethyl esters (LOVAZA) 1 G capsule TAKE TWO CAPSULES BY MOUTH TWICE DAILY 120 capsule 10  . pantoprazole (PROTONIX) 40 MG tablet Take 1 tablet (40 mg total) by mouth daily. Make appt for refills. 30 tablet 0  . Phenazopyridine HCl (AZO TABS PO) Take 1 tablet by mouth 2 (two) times daily.    . polysaccharide iron (NIFEREX) 150 MG CAPS capsule Take 150 mg by mouth daily.     . primidone (MYSOLINE) 50 MG tablet Take 50 mg by mouth daily.      . ranitidine (ZANTAC) 150 MG capsule Take 150 mg by mouth every evening.    . senna (SENOKOT) 8.6 MG tablet Take 1 tablet by mouth 2 (two) times daily.      Marland Kitchen torsemide (DEMADEX) 20 MG tablet TAKE ONE TABLET BY MOUTH TWICE DAILY 60 tablet 0  . [DISCONTINUED] pregabalin (LYRICA) 75 MG capsule Take 75 mg by mouth daily.       No current facility-administered medications for this visit.    Socially  she is married and has 6 children 10 grandchildren 2 great-grandchildren. She is originally from [place].  ROS General: Negative; No fevers, chills, or night sweats; positive for obesity. HEENT: Negative; No changes in vision or hearing, sinus congestion, difficulty swallowing Pulmonary: She has home O2; No cough, wheezing, shortness of breath, hemoptysis Cardiovascular: See history of present illness;  positive for occasional palpitations;No chest pain, presyncope, syncope,  Positive for intermittent leg swelling GI: Positive for GERD No nausea, vomiting, diarrhea, or abdominal pain GU: Negative; No dysuria, hematuria, or difficulty voiding Musculoskeletal: Positive for history of gout.; no myalgias, joint pain,  or weakness Hematologic/Oncology: Negative; no easy bruising, bleeding Endocrine: Positive for diabetes mellitus; no heat/cold intolerance;  Neuro: Negative; no changes in balance, headaches Skin: Negative; No rashes or skin lesions Psychiatric: Negative; No behavioral problems, depression Sleep: Negative; No snoring, daytime sleepiness, hypersomnolence, bruxism, restless legs, hypnogognic hallucinations, no cataplexy Other comprehensive 14 point system review is negative.  PE BP 124/60 mmHg  Pulse 61  Ht _0  (1.575 m)  Wt 200 lb (90.719 kg)  BMI 36.57 kg/m2   Wt Readings from Last 3 Encounters:  08/16/15 200 lb (90.719 kg)  07/14/14 200 lb (90.719 kg)  09/04/12 204 lb (92.534 kg)   General: Alert, oriented, no distress.  Skin: normal turgor, no rashes HEENT: Normocephalic, atraumatic. Pupils round and reactive; sclera anicteric;no lid lag.  Nose without nasal septal hypertrophy Mouth/Parynx benign; Mallinpatti scale 3 Neck: No JVD, no carotid bruits; normal upstroke Lungs: clear to ausculatation and percussion; no wheezing or rales Heart: RRR, s1 s2 normal 1/6 systolic murmur; no diastolic murmur.  No rubs, thrills or heaves Abdomen: Positive for obesity soft,  nontender; no hepatosplenomehaly, BS+; abdominal aorta nontender and not dilated by palpation. Pulses 2+ Extremities: Improvement in previous leg edema edema; trace bilaterally; no clubbing cyanosis or edema, Homan's sign negative  Neurologic: grossly nonfocal Psychological normal affect.  Very pleasant.  Decreased short-term memory.  ECG (independently read by me): Normal sinus rhythm at 61 each per minute.  Left bundle branch block with repolarization changes.  October 2015 ECG (independently read by me): Normal sinus rhythm at 62 beats per minute.  Left bundle branch block.  No significant ST changes  July 2015 (independently read by me): Sinus rhythm with occasional PVC with a ventricular couplet.  Interventricular conduction block.  Prior August 2014 ECG: Sinus rhythm at 77 beats per minute with occasional PVC. ST-T changes inferolaterally. She has a small Q wave in the lead 3.  LABS: BMP Latest Ref Rng 04/12/2014 04/29/2013 12/03/2011  Glucose 70 - 99 mg/dL 120(H) 124(H) 125(H)  BUN 6 - 23 mg/dL 24(H) 33(H) 11  Creatinine 0.50 - 1.10 mg/dL 0.87 0.95 0.68  Sodium 135 - 145 mEq/L 139 136 138  Potassium 3.5 - 5.3 mEq/L 3.8 4.8 3.3(L)  Chloride 96 - 112 mEq/L 96 96 99  CO2 19 - 32 mEq/L _1 Calcium 8.4 - 10.5 mg/dL 8.7 9.2 7.8(L)   Hepatic Function Latest Ref Rng 04/12/2014 04/12/2014 04/29/2013  Total Protein 6.0 - 8.3 g/dL 6.2 6.2 6.7  Albumin 3.5 - 5.2 g/dL 3.5 3.5 3.8  AST 0 - 37 U/L _2 ALT 0 - 35 U/L _3 Alk Phosphatase 39 - 117 U/L 87 87 99  Total Bilirubin 0.2 - 1.2 mg/dL 0.4 0.4 0.3  Bilirubin, Direct 0.0 - 0.3 mg/dL 0.1 - -   CBC Latest Ref Rng 11/30/2014 04/12/2014 03/08/2014  WBC 3.9 - 10.3 10e3/uL 8.2 6.1 5.6  Hemoglobin 11.6 - 15.9 g/dL 10.8(L) 10.7(L) 10.9(L)  Hematocrit 34.8 - 46.6 % 35.5 35.2(L) 35.8  Platelets 145 - 400 10e3/uL 117(L) 161 120(L)   Lab Results  Component Value Date   MCV 89.9 11/30/2014   MCV 84.6 04/12/2014   MCV 82.5 03/08/2014     Lab Results  Component Value Date   TSH 1.831 04/12/2014   Lab Results  Component Value Date   HGBA1C 6.1* 09/20/2011   Lipid Panel     Component Value Date/Time   CHOL 182 04/12/2014 1414   TRIG  199* 04/12/2014 1414   HDL 39* 04/12/2014 1414   CHOLHDL 4.7 04/12/2014 1414   VLDL 40 04/12/2014 1414   LDLCALC 103* 04/12/2014 1414    RADIOLOGY: No results found.    ASSESSMENT AND PLAN: Mrs. Mclaurin is an 81 year old female who was originally from [place] who is 16 years status post CABG surgery and 10 years status post stenting of the vein graft to the RCA.  She has a documented occlusion of her distal RCA with left to right collaterals.    When I saw her last year  I further titrated her isosorbide to 90 mg, which has improved her chest pain symptomatology.  She denies any significant shortness of breath.  She denies PND, orthopnea.  Her chest pain is not with activity, but she is not very active. Her GERD-like symptoms have improved with the change from Zantac to Protonix.  She is tolerating the increased dose of Lopressor at 50 mg twice a day, and denies any recent ectopy.  She continues to be on atorvastatin 40 mg for hyperlipidemia.  Her blood pressure today is controlled with lisinopril 2.5 mg and her metoprolol 50 mg twice a day.  Her peripheral edema has improved and she continues to take  torsemide 20 mg twice a day.   She continues to have hand tremors.  If her tremors become more progressive, neurologic evaluation would be recommended.  As long as she remains stable, I will see her in one year for follow-up cartilaginous evaluation or sooner for problems arise.  Time spent: 25 minutes Troy Sine, MD, [facility]  08/16/2015 6:27 PM

## 2015-08-22 ENCOUNTER — Telehealth: Payer: Self-pay | Admitting: *Deleted

## 2015-08-22 NOTE — Telephone Encounter (Signed)
Patient's daughter called asking for collaborative nurse.  This triage nurse offered to help.  Reports she "thinks mom needs transfusion".  Reluctant to answer assessment questions.  "I'm in the hospital and need to speak to Dr. Kalman DrapeSherrill's nurse."  With call transfer, voicemail received.

## 2015-08-22 NOTE — Telephone Encounter (Signed)
Attempted to call pt's daughter back; left voice message to call office

## 2015-08-23 ENCOUNTER — Telehealth: Payer: Self-pay

## 2015-08-23 DIAGNOSIS — D696 Thrombocytopenia, unspecified: Secondary | ICD-10-CM

## 2015-08-23 DIAGNOSIS — D509 Iron deficiency anemia, unspecified: Secondary | ICD-10-CM

## 2015-08-23 NOTE — Telephone Encounter (Signed)
Pt is [facility] due to near syncope. Her Hgb was as low as 6. She has received 2 units of blood. She may be discharged today or tomorrow. Raja will call when pt dc from [facility].  Neg blood in stool, neg blood in urine. Echo is being done today, MRI was negative. Angelyn PuntRaja is hesitant to have an endoscopy done for her mother d/t heart problems. She has appt with Dr Truett PernaSherrill on 12/13. Does the pt need to be seen before this.

## 2015-08-24 ENCOUNTER — Telehealth: Payer: Self-pay | Admitting: Oncology

## 2015-08-24 ENCOUNTER — Telehealth: Payer: Self-pay | Admitting: *Deleted

## 2015-08-24 ENCOUNTER — Telehealth: Payer: Self-pay | Admitting: Cardiovascular Disease

## 2015-08-24 NOTE — Telephone Encounter (Signed)
s.w. pt and advised on appt d.t change.Marland Kitchen.Marland Kitchen.Marland Kitchen.Marland Kitchen.pt ok and aware

## 2015-08-24 NOTE — Addendum Note (Signed)
Addended by: Gala RomneyHORTON, Aryaan Persichetti P on: 08/24/2015 08:57 AM   Modules accepted: Orders

## 2015-08-24 NOTE — Telephone Encounter (Signed)
Pt's daughter called in stating that on 11/29 pt was admitted [facility] for a near syncope episode. She was told that her hemoglobin levels were low and was given 2 units of blood. While the pt was in the hospital , she had an Echo done and was told the it came back normal and the pt's Plavix and Furosemide was stopped. Pt's physician was Dr. Neomia GlassArshad(she was discharged on 11/29). Please f/u with the daughter .  Thanks

## 2015-08-24 NOTE — Telephone Encounter (Signed)
Returned call to patient's daughter Angelyn PuntRaja.She stated mother was [facility] 11[facility] for syncope.Stated her blood count was low and she received 2 units of blood.Stated she was told to stop taking plavix and torsemide.Stated she wanted to ask Dr.Kelly if that is ok.Message sent to [facility] for advice.

## 2015-08-24 NOTE — Telephone Encounter (Signed)
Left message on home voice mail per Dr. Truett PernaSherrill; pt can be seen earlier than 12/13.  Schedulers will call with appt for 09/02/15 @ 12:30 with labs prior to visit.  Call office if any questions.

## 2015-08-24 NOTE — Telephone Encounter (Signed)
Received message from pt's daughter requesting to move appt from 12/9 12:30 to later in the day d/t holy day, etc.  Per Dr. Truett PernaSherrill; spoke with pt's daughter offering 12/8 @ 3:30 for labs then 4pm to see Dr. Truett PernaSherrill.  Pt's daughter confirmed they can be here at that time.

## 2015-08-24 NOTE — Telephone Encounter (Signed)
lvm for pt dtr and advised on DEC appt....ok and aware

## 2015-08-25 NOTE — Telephone Encounter (Signed)
Pt's daughter is calling back.

## 2015-08-25 NOTE — Telephone Encounter (Signed)
Daughter wanted to know if patient should restart PLAVIX   Patient has not had a dose for 2 days - per orders form post visit   patient has an appointment on Sep 01 2015 per daughter - cancer MD?  RN informed daughter will discuss with Dr Tresa EndoKelly and contact her back

## 2015-08-25 NOTE — Telephone Encounter (Signed)
See other phone notes.  Patient was in [facility].

## 2015-08-25 NOTE — Telephone Encounter (Signed)
Spoke to Dr Tresa EndoKELLY DR Tresa EndoKELLY REVIEWED patient's information from  [facility]  Per Dr Tresa EndoKelly, continue to hold PLAVIX until patient see Dr Truett PernaSherrill on 09/01/15. Let him make the decision. Patient may use torsemide if swelling re-appears.  RN spoke to daughter, information given ,verbalized understanding  daughter wanted to know if patient need to see Dr Tresa EndoKelly after appointment on 09/01/15.  RN informed daughter contact office after that appointment,to see if anything else needs to be handle. Daughter verbalized understanding.

## 2015-08-25 NOTE — Telephone Encounter (Signed)
Pt is calling back to speak with a nurse about medications

## 2015-08-25 NOTE — Telephone Encounter (Signed)
I spoke with [name]

## 2015-09-01 ENCOUNTER — Ambulatory Visit (HOSPITAL_COMMUNITY)
Admission: RE | Admit: 2015-09-01 | Discharge: 2015-09-01 | Disposition: A | Discharge status: Home / Self Care | Payer: Medicare Other | Source: Ambulatory Visit | Attending: Oncology | Admitting: Oncology

## 2015-09-01 ENCOUNTER — Encounter: Payer: Self-pay | Admitting: Internal Medicine

## 2015-09-01 ENCOUNTER — Inpatient Hospital Stay (HOSPITAL_COMMUNITY): Payer: Medicare Other

## 2015-09-01 ENCOUNTER — Ambulatory Visit (HOSPITAL_BASED_OUTPATIENT_CLINIC_OR_DEPARTMENT_OTHER): Payer: Medicare Other | Admitting: Oncology

## 2015-09-01 ENCOUNTER — Telehealth: Payer: Self-pay | Admitting: Oncology

## 2015-09-01 ENCOUNTER — Inpatient Hospital Stay (HOSPITAL_COMMUNITY)
Admission: AD | Admit: 2015-09-01 | Discharge: 2015-09-05 | DRG: 811 | Disposition: A | Discharge status: Home / Self Care | Payer: Medicare Other | Source: Ambulatory Visit | Attending: Internal Medicine | Admitting: Internal Medicine

## 2015-09-01 ENCOUNTER — Encounter (HOSPITAL_COMMUNITY): Payer: Self-pay | Admitting: *Deleted

## 2015-09-01 ENCOUNTER — Other Ambulatory Visit (HOSPITAL_BASED_OUTPATIENT_CLINIC_OR_DEPARTMENT_OTHER): Payer: Medicare Other

## 2015-09-01 VITALS — BP 132/47 | HR 56 | Temp 98.5°F | Resp 18 | Ht 62.0 in

## 2015-09-01 DIAGNOSIS — D696 Thrombocytopenia, unspecified: Secondary | ICD-10-CM

## 2015-09-01 DIAGNOSIS — D649 Anemia, unspecified: Secondary | ICD-10-CM | POA: Insufficient documentation

## 2015-09-01 DIAGNOSIS — E785 Hyperlipidemia, unspecified: Secondary | ICD-10-CM | POA: Diagnosis not present

## 2015-09-01 DIAGNOSIS — I2510 Atherosclerotic heart disease of native coronary artery without angina pectoris: Secondary | ICD-10-CM | POA: Diagnosis present

## 2015-09-01 DIAGNOSIS — G250 Essential tremor: Secondary | ICD-10-CM | POA: Diagnosis present

## 2015-09-01 DIAGNOSIS — I69351 Hemiplegia and hemiparesis following cerebral infarction affecting right dominant side: Secondary | ICD-10-CM

## 2015-09-01 DIAGNOSIS — I509 Heart failure, unspecified: Secondary | ICD-10-CM | POA: Diagnosis not present

## 2015-09-01 DIAGNOSIS — J9610 Chronic respiratory failure, unspecified whether with hypoxia or hypercapnia: Secondary | ICD-10-CM | POA: Diagnosis present

## 2015-09-01 DIAGNOSIS — E119 Type 2 diabetes mellitus without complications: Secondary | ICD-10-CM | POA: Diagnosis present

## 2015-09-01 DIAGNOSIS — Z880 Allergy status to penicillin: Secondary | ICD-10-CM

## 2015-09-01 DIAGNOSIS — R600 Localized edema: Secondary | ICD-10-CM

## 2015-09-01 DIAGNOSIS — D509 Iron deficiency anemia, unspecified: Secondary | ICD-10-CM | POA: Diagnosis present

## 2015-09-01 DIAGNOSIS — Z6836 Body mass index (BMI) 36.0-36.9, adult: Secondary | ICD-10-CM | POA: Diagnosis not present

## 2015-09-01 DIAGNOSIS — Z859 Personal history of malignant neoplasm, unspecified: Secondary | ICD-10-CM | POA: Insufficient documentation

## 2015-09-01 DIAGNOSIS — I110 Hypertensive heart disease with heart failure: Secondary | ICD-10-CM | POA: Diagnosis present

## 2015-09-01 DIAGNOSIS — Z993 Dependence on wheelchair: Secondary | ICD-10-CM | POA: Diagnosis not present

## 2015-09-01 DIAGNOSIS — M109 Gout, unspecified: Secondary | ICD-10-CM | POA: Diagnosis present

## 2015-09-01 DIAGNOSIS — R0602 Shortness of breath: Secondary | ICD-10-CM

## 2015-09-01 DIAGNOSIS — E875 Hyperkalemia: Secondary | ICD-10-CM | POA: Diagnosis present

## 2015-09-01 DIAGNOSIS — K219 Gastro-esophageal reflux disease without esophagitis: Secondary | ICD-10-CM | POA: Diagnosis not present

## 2015-09-01 DIAGNOSIS — Y95 Nosocomial condition: Secondary | ICD-10-CM | POA: Diagnosis present

## 2015-09-01 DIAGNOSIS — Z9981 Dependence on supplemental oxygen: Secondary | ICD-10-CM

## 2015-09-01 DIAGNOSIS — E669 Obesity, unspecified: Secondary | ICD-10-CM

## 2015-09-01 DIAGNOSIS — Z7984 Long term (current) use of oral hypoglycemic drugs: Secondary | ICD-10-CM

## 2015-09-01 DIAGNOSIS — I5033 Acute on chronic diastolic (congestive) heart failure: Secondary | ICD-10-CM | POA: Diagnosis not present

## 2015-09-01 DIAGNOSIS — R0902 Hypoxemia: Secondary | ICD-10-CM

## 2015-09-01 DIAGNOSIS — E1169 Type 2 diabetes mellitus with other specified complication: Secondary | ICD-10-CM | POA: Diagnosis not present

## 2015-09-01 DIAGNOSIS — Z955 Presence of coronary angioplasty implant and graft: Secondary | ICD-10-CM | POA: Diagnosis not present

## 2015-09-01 DIAGNOSIS — I10 Essential (primary) hypertension: Secondary | ICD-10-CM | POA: Diagnosis not present

## 2015-09-01 DIAGNOSIS — E109 Type 1 diabetes mellitus without complications: Secondary | ICD-10-CM

## 2015-09-01 DIAGNOSIS — I5021 Acute systolic (congestive) heart failure: Secondary | ICD-10-CM | POA: Diagnosis not present

## 2015-09-01 DIAGNOSIS — J189 Pneumonia, unspecified organism: Secondary | ICD-10-CM | POA: Diagnosis present

## 2015-09-01 DIAGNOSIS — Z951 Presence of aortocoronary bypass graft: Secondary | ICD-10-CM | POA: Diagnosis not present

## 2015-09-01 LAB — CHCC SMEAR

## 2015-09-01 LAB — COMPREHENSIVE METABOLIC PANEL
ALT: 27 U/L (ref 14–54)
ANION GAP: 4 — AB (ref 5–15)
AST: 44 U/L — ABNORMAL HIGH (ref 15–41)
Albumin: 3 g/dL — ABNORMAL LOW (ref 3.5–5.0)
Alkaline Phosphatase: 144 U/L — ABNORMAL HIGH (ref 38–126)
BUN: 23 mg/dL — ABNORMAL HIGH (ref 6–20)
CHLORIDE: 107 mmol/L (ref 101–111)
CO2: 29 mmol/L (ref 22–32)
CREATININE: 0.95 mg/dL (ref 0.44–1.00)
Calcium: 8.5 mg/dL — ABNORMAL LOW (ref 8.9–10.3)
GFR calc non Af Amer: 55 mL/min — ABNORMAL LOW (ref >60)
Glucose, Bld: 100 mg/dL — ABNORMAL HIGH (ref 65–99)
Potassium: 5.8 mmol/L — ABNORMAL HIGH (ref 3.5–5.1)
SODIUM: 140 mmol/L (ref 135–145)
Total Bilirubin: 0.8 mg/dL (ref 0.3–1.2)
Total Protein: 6.5 g/dL (ref 6.5–8.1)

## 2015-09-01 LAB — CBC WITH DIFFERENTIAL/PLATELET
BASO%: 1.2 % (ref 0.0–2.0)
BASOS PCT: 0 %
Basophils Absolute: 0.1 10*3/uL (ref 0.0–0.1)
Basophils Absolute: 0 10*3/uL (ref 0.0–0.1)
EOS ABS: 0.2 10*3/uL (ref 0.0–0.5)
EOS ABS: 0.4 10*3/uL (ref 0.0–0.7)
EOS PCT: 6 %
EOS%: 4 % (ref 0.0–7.0)
HCT: 26.6 % — ABNORMAL LOW (ref 36.0–46.0)
HEMATOCRIT: 26.5 % — AB (ref 34.8–46.6)
HGB: 8 g/dL — ABNORMAL LOW (ref 11.6–15.9)
Hemoglobin: 7.9 g/dL — ABNORMAL LOW (ref 12.0–15.0)
LYMPH#: 1.1 10*3/uL (ref 0.9–3.3)
LYMPH%: 18.2 % (ref 14.0–49.7)
LYMPHS ABS: 1.3 10*3/uL (ref 0.7–4.0)
Lymphocytes Relative: 20 %
MCH: 27.2 pg (ref 25.1–34.0)
MCH: 28.1 pg (ref 26.0–34.0)
MCHC: 30.2 g/dL — ABNORMAL LOW (ref 31.5–36.0)
MCHC: 29.7 g/dL — ABNORMAL LOW (ref 30.0–36.0)
MCV: 90 fL (ref 79.5–101.0)
MCV: 94.7 fL (ref 78.0–100.0)
MONO#: 0.6 10*3/uL (ref 0.1–0.9)
MONO%: 9.4 % (ref 0.0–14.0)
Monocytes Absolute: 0.6 10*3/uL (ref 0.1–1.0)
Monocytes Relative: 9 %
NEUT%: 67.2 % (ref 38.4–76.8)
NEUTROS ABS: 3.9 10*3/uL (ref 1.5–6.5)
Neutro Abs: 4.1 10*3/uL (ref 1.7–7.7)
Neutrophils Relative %: 65 %
PLATELETS: 149 10*3/uL (ref 145–400)
PLATELETS: 135 10*3/uL — AB (ref 150–400)
RBC: 2.94 10*6/uL — AB (ref 3.70–5.45)
RBC: 2.81 MIL/uL — AB (ref 3.87–5.11)
RDW: 19.3 % — ABNORMAL HIGH (ref 11.2–14.5)
RDW: 19.1 % — ABNORMAL HIGH (ref 11.5–15.5)
WBC: 5.9 10*3/uL (ref 3.9–10.3)
WBC: 6.2 10*3/uL (ref 4.0–10.5)

## 2015-09-01 LAB — TSH: TSH: 1.535 u[IU]/mL (ref 0.350–4.500)

## 2015-09-01 LAB — HEPATIC FUNCTION PANEL
ALK PHOS: 147 U/L — AB (ref 38–126)
ALT: 26 U/L (ref 14–54)
AST: 44 U/L — AB (ref 15–41)
Albumin: 2.8 g/dL — ABNORMAL LOW (ref 3.5–5.0)
BILIRUBIN INDIRECT: 0.5 mg/dL (ref 0.3–0.9)
Bilirubin, Direct: 0.1 mg/dL (ref 0.1–0.5)
TOTAL PROTEIN: 6.3 g/dL — AB (ref 6.5–8.1)
Total Bilirubin: 0.6 mg/dL (ref 0.3–1.2)

## 2015-09-01 LAB — PROTIME-INR
INR: 1.07 (ref 0.00–1.49)
PROTHROMBIN TIME: 14.1 s (ref 11.6–15.2)

## 2015-09-01 LAB — PREPARE RBC (CROSSMATCH)

## 2015-09-01 LAB — HOLD TUBE, BLOOD BANK

## 2015-09-01 LAB — DRAW EXTRA CLOT TUBE

## 2015-09-01 LAB — TROPONIN I: TROPONIN I: 0.03 ng/mL (ref <0.031)

## 2015-09-01 MED ORDER — SODIUM CHLORIDE 0.9 % IV SOLN: Freq: Once | INTRAVENOUS | Status: DC

## 2015-09-01 MED ORDER — VITAMIN B-1 100 MG PO TABS
100.0000 mg | ORAL_TABLET | Freq: Every day | ORAL | Status: DC
  Administered 2015-09-02 – 2015-09-05 (×5): 100 mg via ORAL
  Filled 2015-09-01 (×5): qty 1

## 2015-09-01 MED ORDER — FOLIC ACID 1 MG PO TABS
1.0000 mg | ORAL_TABLET | Freq: Every day | ORAL | Status: DC
Start: 2015-09-02 — End: 2015-09-05
  Administered 2015-09-02 – 2015-09-05 (×4): 1 mg via ORAL
  Filled 2015-09-01 (×4): qty 1

## 2015-09-01 MED ORDER — ALLOPURINOL 100 MG PO TABS
100.0000 mg | ORAL_TABLET | Freq: Every day | ORAL | Status: DC
  Administered 2015-09-02 – 2015-09-05 (×4): 100 mg via ORAL
  Filled 2015-09-01 (×4): qty 1

## 2015-09-01 MED ORDER — SODIUM CHLORIDE 0.9 % IV SOLN
INTRAVENOUS | Status: DC
  Administered 2015-09-02: 07:00:00 via INTRAVENOUS

## 2015-09-01 MED ORDER — BENZONATATE 100 MG PO CAPS
100.0000 mg | ORAL_CAPSULE | Freq: Three times a day (TID) | ORAL | Status: DC | PRN
  Administered 2015-09-02 – 2015-09-03 (×3): 100 mg via ORAL
  Filled 2015-09-01 (×3): qty 1

## 2015-09-01 MED ORDER — PRIMIDONE 50 MG PO TABS
50.0000 mg | ORAL_TABLET | Freq: Every day | ORAL | Status: DC
  Administered 2015-09-02 – 2015-09-05 (×5): 50 mg via ORAL
  Filled 2015-09-01 (×5): qty 1

## 2015-09-01 MED ORDER — INSULIN ASPART 100 UNIT/ML ~~LOC~~ SOLN
0.0000 [IU] | SUBCUTANEOUS | Status: DC
  Administered 2015-09-02: 2 [IU] via SUBCUTANEOUS

## 2015-09-01 MED ORDER — PANTOPRAZOLE SODIUM 40 MG PO TBEC
40.0000 mg | DELAYED_RELEASE_TABLET | Freq: Every day | ORAL | Status: DC
  Administered 2015-09-02 – 2015-09-05 (×4): 40 mg via ORAL
  Filled 2015-09-01 (×5): qty 1

## 2015-09-01 MED ORDER — COLCHICINE 0.6 MG PO TABS
0.6000 mg | ORAL_TABLET | Freq: Every day | ORAL | Status: DC
  Administered 2015-09-02 – 2015-09-05 (×4): 0.6 mg via ORAL
  Filled 2015-09-01 (×4): qty 1

## 2015-09-01 MED ORDER — ACETAMINOPHEN 500 MG PO TABS
1000.0000 mg | ORAL_TABLET | Freq: Four times a day (QID) | ORAL | Status: DC | PRN
  Administered 2015-09-02 – 2015-09-04 (×2): 1000 mg via ORAL
  Filled 2015-09-01 (×2): qty 2

## 2015-09-01 MED ORDER — LINAGLIPTIN 5 MG PO TABS
5.0000 mg | ORAL_TABLET | Freq: Every day | ORAL | Status: DC
  Filled 2015-09-01: qty 1

## 2015-09-01 MED ORDER — ATORVASTATIN CALCIUM 40 MG PO TABS
40.0000 mg | ORAL_TABLET | Freq: Every day | ORAL | Status: DC
  Administered 2015-09-02 – 2015-09-04 (×3): 40 mg via ORAL
  Filled 2015-09-01 (×4): qty 1

## 2015-09-01 MED ORDER — ADULT MULTIVITAMIN W/MINERALS CH
1.0000 | ORAL_TABLET | Freq: Every day | ORAL | Status: DC
  Administered 2015-09-02 – 2015-09-05 (×5): 1 via ORAL
  Filled 2015-09-01 (×5): qty 1

## 2015-09-01 MED ORDER — SODIUM CHLORIDE 0.9 % IJ SOLN
3.0000 mL | Freq: Two times a day (BID) | INTRAMUSCULAR | Status: DC
  Administered 2015-09-01 – 2015-09-05 (×5): 3 mL via INTRAVENOUS

## 2015-09-01 MED ORDER — FAMOTIDINE 20 MG PO TABS: 20.0000 mg | ORAL_TABLET | Freq: Every day | ORAL | Status: DC

## 2015-09-01 MED ORDER — LISINOPRIL 2.5 MG PO TABS
2.5000 mg | ORAL_TABLET | Freq: Every day | ORAL | Status: DC
  Filled 2015-09-01: qty 1

## 2015-09-01 MED ORDER — ISOSORBIDE MONONITRATE ER 60 MG PO TB24
60.0000 mg | ORAL_TABLET | Freq: Every day | ORAL | Status: DC
  Administered 2015-09-02 – 2015-09-05 (×4): 60 mg via ORAL
  Filled 2015-09-01 (×3): qty 1
  Filled 2015-09-01: qty 2

## 2015-09-01 MED ORDER — SENNA 8.6 MG PO TABS
1.0000 | ORAL_TABLET | Freq: Two times a day (BID) | ORAL | Status: DC
  Administered 2015-09-02 – 2015-09-05 (×8): 8.6 mg via ORAL
  Filled 2015-09-01 (×9): qty 1

## 2015-09-01 MED ORDER — METOPROLOL TARTRATE 50 MG PO TABS
50.0000 mg | ORAL_TABLET | Freq: Two times a day (BID) | ORAL | Status: DC
  Administered 2015-09-02 – 2015-09-05 (×8): 50 mg via ORAL
  Filled 2015-09-01 (×8): qty 1

## 2015-09-01 MED ORDER — MORPHINE SULFATE (PF) 2 MG/ML IV SOLN
1.0000 mg | INTRAVENOUS | Status: DC | PRN
  Administered 2015-09-02: 1 mg via INTRAVENOUS
  Filled 2015-09-01: qty 1

## 2015-09-01 MED ORDER — ONDANSETRON HCL 4 MG PO TABS: 4.0000 mg | ORAL_TABLET | Freq: Four times a day (QID) | ORAL | Status: DC | PRN

## 2015-09-01 MED ORDER — NITROGLYCERIN 0.4 MG SL SUBL: 0.4000 mg | SUBLINGUAL_TABLET | SUBLINGUAL | Status: DC | PRN

## 2015-09-01 MED ORDER — FUROSEMIDE 10 MG/ML IJ SOLN
20.0000 mg | Freq: Once | INTRAMUSCULAR | Status: AC
  Administered 2015-09-01: 20 mg via INTRAVENOUS
  Filled 2015-09-01: qty 2

## 2015-09-01 MED ORDER — ONDANSETRON HCL 4 MG/2ML IJ SOLN: 4.0000 mg | Freq: Four times a day (QID) | INTRAMUSCULAR | Status: DC | PRN

## 2015-09-01 MED ORDER — PHENAZOPYRIDINE HCL 100 MG PO TABS: 95.0000 mg | ORAL_TABLET | Freq: Two times a day (BID) | ORAL | Status: DC

## 2015-09-01 NOTE — H&P (Signed)
[facility] History and Physical  [name] NFA:[id number] DOB: 01/06/1931 DOA: 09/01/2015  Referring physician: Midge Minium, MD PCP: Lenora Boys, MD   Chief Complaint: Weakness  HPI: [name] is a 81 y.o. female with prior history of CVA presented to the hospital from the oncology clinic after she presented there with weakness and was found to have anemia. She does not speak english. She was apparently admitted to [facility] at the end of November with nausea and syncopal episode. She was transfused due to a Hgb of 6.1 based on the medical record. Patient was also noted to have an elevated BUN and creatinine og 61 and 1.14 on 11/26. Her diuretics were held at that time. She came now because she has been having confusion wekness and has been overall having no energy. Also noted she had some black stools according to the notes from [facility]. There is no chest pain no edema. She has no nausea and no vomiting. She has no abdominal pain noted   Review of Systems:  Complete ROS performed and is unremarkable other than HPI.   Past Medical History  Diagnosis Date  . Diabetes mellitus   . Hypertension   . Stroke [facility]) Right Side Weakness  . CHF (congestive heart failure) ([facility])     2D ECHO, 09/13/2011 - EF 40-45%, normal  . 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 Pain in limb     BILATERAL EXTREMITY VENOUS DUPLEX, 01/08/2011 - no evidence of deep vein or superficial thrombosis or Baker's cyst   Past Surgical History  Procedure Laterality Date  . Cardiac surgery    . Cardiac catheterization  10/27/2004    3 stents placed, 3.5x28 proximally,3.5x33 mid segment, and 3.5x33 in the region of the crux, stenosis being reduced to 0% at proximal and mid segment and being reduced from 80% ot 10% in the stent  at the cruxed segment  . Cardiac catheterization  09/26/2004    High grade 95% ostial stenosis in the RCA with 70% mid stenosis, 95% distal stenosis and total occlusion of the PDA with collaterals  . Coronary artery bypass graft  02/24/1999    x4; IMA to distal LAD, left radial to first circ, SVG to first diagonal, SVG to posterior descending coronary artery   Social History:  reports that she has never smoked. She has never used smokeless tobacco. She reports that she does not drink alcohol or use illicit drugs.  Allergies  Allergen Reactions  . Penicillins Itching and Swelling    Family History  Problem Relation Age of Onset  . Cancer Mother     Lung  . Heart disease Brother   . Hypertension Brother   . Heart disease Brother   . Hypertension Brother      Prior to Admission medications   Medication Sig Start Date End Date Taking? Authorizing Provider  acetaminophen (TYLENOL) 500 MG tablet Take 1,000 mg by mouth every 6 (six) hours as needed. pain     Historical Provider, MD  allopurinol (ZYLOPRIM) 100 MG tablet Take 100 mg by mouth daily. 03/08/14   Historical Provider, MD  atorvastatin (LIPITOR) 40 MG tablet Take 1 tablet (40 mg total) by mouth daily. 08/16/15   Lennette Bihari, MD  clopidogrel (PLAVIX) 75 MG tablet Take 1 tablet (75 mg total) by mouth  daily. Patient not taking: Reported on 09/01/2015 08/16/15   Lennette Bihari, MD  colchicine 0.6 MG tablet Take 0.6 mg by mouth daily.     Historical Provider, MD  Cranberry-Vitamin C-Probiotic (AZO CRANBERRY PO) Take 1 capsule by mouth 2 (two) times daily.    Historical Provider, MD  folic acid (FOLVITE) 1 MG tablet Take 1 tablet (1 mg total) by mouth daily. 08/12/15   Lennette Bihari, MD  isosorbide mononitrate (IMDUR) 60 MG 24 hr tablet Take 1 tablet (60 mg total) by mouth daily. NEED OV. 08/16/15   Lennette Bihari, MD  JANUVIA 50 MG tablet Take 50 mg by mouth daily. 04/09/14   Historical Provider, MD  lisinopril (PRINIVIL,ZESTRIL) 2.5 MG  tablet Take 1 tablet (2.5 mg total) by mouth daily. 08/16/15   Lennette Bihari, MD  metoprolol (LOPRESSOR) 50 MG tablet Take 1 tablet (50 mg total) by mouth 2 (two) times daily. 08/16/15   Lennette Bihari, MD  nitroGLYCERIN (NITROSTAT) 0.4 MG SL tablet Place 1 tablet (0.4 mg total) under the tongue as needed. 08/16/15   Lennette Bihari, MD  omega-3 acid ethyl esters (LOVAZA) 1 G capsule TAKE TWO CAPSULES BY MOUTH TWICE DAILY 08/16/15   Lennette Bihari, MD  pantoprazole (PROTONIX) 40 MG tablet Take 1 tablet (40 mg total) by mouth daily. Make appt for refills. 08/16/15   Lennette Bihari, MD  Phenazopyridine HCl (AZO TABS PO) Take 1 tablet by mouth 2 (two) times daily.    Historical Provider, MD  polysaccharide iron (NIFEREX) 150 MG CAPS capsule Take 150 mg by mouth daily.     Historical Provider, MD  primidone (MYSOLINE) 50 MG tablet Take 50 mg by mouth daily.      Historical Provider, MD  ranitidine (ZANTAC) 150 MG capsule Take 150 mg by mouth every evening.    Historical Provider, MD  senna (SENOKOT) 8.6 MG tablet Take 1 tablet by mouth 2 (two) times daily.      Historical Provider, MD  torsemide (DEMADEX) 20 MG tablet Take 1 tablet (20 mg total) by mouth 2 (two) times daily. Patient not taking: Reported on 09/01/2015 08/16/15   Lennette Bihari, MD   Physical Exam: There were no vitals filed for this visit.  Wt Readings from Last 3 Encounters:  08/16/15 90.719 kg (200 lb)  07/14/14 90.719 kg (200 lb)  09/04/12 92.534 kg (204 lb)    General:  Appears anxious Eyes: normal lids, irises & conjunctiva ENT: grossly normal lips & tongue Neck: no LAD, masses or thyromegaly Cardiovascular: RRR, no m/r/g. +LE edema Respiratory: few scattered rhonchi. Normal respiratory effort. Abdomen: soft, ntnd distended Skin: no rash or induration seen on limited exam Musculoskeletal: grossly normal tone BUE/BLE Psychiatric: anxious Neurologic: unable to assess.          Labs on Admission:  Basic Metabolic  Panel: No results for input(s): NA, K, CL, CO2, GLUCOSE, BUN, CREATININE, CALCIUM, MG, PHOS in the last 168 hours. Liver Function Tests: No results for input(s): AST, ALT, ALKPHOS, BILITOT, PROT, ALBUMIN in the last 168 hours. No results for input(s): LIPASE, AMYLASE in the last 168 hours. No results for input(s): AMMONIA in the last 168 hours. CBC:  Recent Labs Lab 09/01/15 1555  WBC 5.9  NEUTROABS 3.9  HGB 8.0*  HCT 26.5*  MCV 90.0  PLT 149   Cardiac Enzymes: No results for input(s): CKTOTAL, CKMB, CKMBINDEX, TROPONINI in the last 168 hours.  BNP (last 3 results) No results  for input(s): BNP in the last 8760 hours.  ProBNP (last 3 results) No results for input(s): PROBNP in the last 8760 hours.  CBG: No results for input(s): GLUCAP in the last 168 hours.  Radiological Exams on Admission: No results found.    Assessment/Plan Active Problems:   Diabetes mellitus ([facility])   GERD (gastroesophageal reflux disease)   Hyperlipidemia   Essential hypertension   Symptomatic anemia   1. Symptomatic anemia -will be admitted for workup -will check iron studies -check stool occult blood -will also transfuse now -Heme consult follow up in am for BM bx -hold plavix  2. HLD -will continue with lipitor -check lipid panel  3. HTN -will monitor pressures -continue with lisinopril lopressor  4. DM II -will continue with oral agents -monitor FSBS and place on SSI as needed  5. GERD -continue with PPI as ordered -continue with zantac  6. CAD/CHF -currently stable  -continue with Imdur  7. Gout -will continue allopurinol pending labs -continue with colchicine pending labs     Code Status: full code DVT Prophylaxis:SCD Family Communication: none Disposition Plan: home    [facility] Center[facility]AN,Breckin Zafar A [facility] Pager [phone]

## 2015-09-01 NOTE — Progress Notes (Signed)
  [facility] OFFICE PROGRESS NOTE   Diagnosis: Anemia  INTERVAL HISTORY:   [name] returns prior to a scheduled visit. She is accompanied by her daughters. She remains wheelchair/bedbound secondary to a remote CVA. The daughter's report she was admitted to [facility] 08/20/2015 with nausea and syncope. She was found to have severe anemia. The hemoglobin returned at 6.1 on 08/22/2015. She was transfused 2 units of packed red blood cells. The hemoglobin returned at 8.2 on 08/23/2015. Her diuretic was placed on hold secondary to a markedly elevated BUN.  The daughter's report she continues to be confused and lethargic. She has a cough and wheezing. They noted "black "stool loss she was in the hospital, but none since. Her appetite is poor.  Objective:  Vital signs in last 24 hours:  Blood pressure 132/47, pulse 56, temperature 98.5 F (36.9 C), temperature source Oral, resp. rate 18, height 5' 2" (1.575 m), SpO2 100 %. on nasal cannula oxygen  Lymphatics: No cervical, supraclavicular, or axillary nodes Resp: Bilateral expiratory wheeze and rhonchi Cardio: Distant heart sounds, regular rate and rhythm GI: Soft, no hepatosplenomegaly Vascular: Trace edema at the right greater than left lower leg Neuro: Follows commands, alert      Lab Results:  Lab Results  Component Value Date   WBC 5.9 09/01/2015   HGB 8.0* 09/01/2015   HCT 26.5* 09/01/2015   MCV 90.0 09/01/2015   PLT 149 09/01/2015   NEUTROABS 3.9 09/01/2015   Peripheral blood smear: Moderate ovalocytes, a few teardrops and target cells, the polychromasia is mildly increased. The white cell morphology is unremarkable. The platelets are normal in number with scattered giant platelet forms.   Medications: I have reviewed the patient's current medications.  Assessment/Plan: 1. History of a microcytic anemia. The MCV remains in the normal range. The anemia has progressed 2. History of coronary  artery disease followed by Dr. Claiborne Billings 3. "Gout."  4. "Giant" platelets on the blood smear from 01/29/2011.  5. 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 She is unable to ambulate.  6. History of a zoster rash at the left lower back and chest.  7. Admission with pneumonia and a CHF exacerbation in December of 2012. 8. Mild thrombocytopenia. Stable.   Disposition:  She has progressive anemia. I discussed the differential diagnosis with her daughters. They like to pursue further diagnostic evaluation. I recommend a bone marrow biopsy. The anemia could be related to bleeding and iron deficiency, a myeloproliferative/myelodysplastic disorder, or a malignancy in the bone marrow.  She appears to be in respiratory distress this evening. This may be related to an upper respiratory infection or CHF.  She will be admitted for further evaluation.  We will contact the hospitalist service to arrange for hospital admission.  Recommendations: 1. Admit for transfusion support with 2 units of packed red blood cells 2. Check ferritin 3. Diagnostic bone marrow aspirate and biopsy for 09/02/2015 if possible 4. I will check on her 09/02/2015 and arrange for outpatient follow-up in the Hematology clinic  Betsy Coder, MD  09/01/2015  4:20 PM

## 2015-09-01 NOTE — Progress Notes (Signed)
Patient ID: [name], female   DOB: Aug 06, 1931, 81 y.o.   MRN: [id number] Pt to be directly admitted from cancer center for wheezing, chronic worsening respiratory status, anemia Will need bone marrow biopsy in am Dr. Benay Spice will see in consultation in am  Observation unless w/u reveals otherwise Family unable to take care of the pt  [facility] [phone]

## 2015-09-01 NOTE — Telephone Encounter (Signed)
Per 12/8 pof PRBC tomorrow at 9 am. Per inf nurse - patient is [facility] going [facility] be a [facility]. Added lab/fu for 1/3 and gave relative avs report and appointments. Other appointments remain. [facility] will call patient re bx appointment.

## 2015-09-01 NOTE — Accreditation Note (Signed)
Patient arrived at around 1900 from the Cancer center,2 daughters, present and ArtistThu RN. Patient speaks Arabic,daughters interprete for the patient. Patient denies pain. 3 assists with transfers from wheelchair to bed. Placed on 3L O2 via nasal cannula.Nurse  Ferd Glassinghu Lee, from Cancer center attempted to placed an IV on R hand but unsuccessful, order placed for IV consult. Admission manager notified of patient's arrival by charge nurse Rene KocherRegina B at around 1905. Endorsed to night nurse Mindy.

## 2015-09-01 NOTE — Progress Notes (Signed)
Utilization Review completed.  Claudio Mondry RN CM  

## 2015-09-01 NOTE — Progress Notes (Signed)
Bladder scanned patient, 203cc greatest yield. Family refusing foley cath. MIVF not started, patient with audible wheezing and generalized edema. MD aware with verbal orders to hold MIVF and foley cath. Medication not in Pyxis to pull, so 1945 meds not given. Report given to [facility] Paonya on 4E.

## 2015-09-02 ENCOUNTER — Inpatient Hospital Stay (HOSPITAL_COMMUNITY): Payer: Medicare Other

## 2015-09-02 ENCOUNTER — Other Ambulatory Visit: Payer: Medicare Other

## 2015-09-02 ENCOUNTER — Other Ambulatory Visit: Payer: Self-pay | Admitting: *Deleted

## 2015-09-02 ENCOUNTER — Ambulatory Visit: Payer: Medicare Other | Admitting: Oncology

## 2015-09-02 DIAGNOSIS — I10 Essential (primary) hypertension: Secondary | ICD-10-CM

## 2015-09-02 DIAGNOSIS — D649 Anemia, unspecified: Principal | ICD-10-CM

## 2015-09-02 DIAGNOSIS — I509 Heart failure, unspecified: Secondary | ICD-10-CM

## 2015-09-02 DIAGNOSIS — E785 Hyperlipidemia, unspecified: Secondary | ICD-10-CM

## 2015-09-02 LAB — CBC
HEMATOCRIT: 33.4 % — AB (ref 36.0–46.0)
HEMATOCRIT: 30.6 % — AB (ref 36.0–46.0)
HEMOGLOBIN: 10.2 g/dL — AB (ref 12.0–15.0)
Hemoglobin: 9.3 g/dL — ABNORMAL LOW (ref 12.0–15.0)
MCH: 28.2 pg (ref 26.0–34.0)
MCH: 27.6 pg (ref 26.0–34.0)
MCHC: 30.5 g/dL (ref 30.0–36.0)
MCHC: 30.4 g/dL (ref 30.0–36.0)
MCV: 92.3 fL (ref 78.0–100.0)
MCV: 90.8 fL (ref 78.0–100.0)
Platelets: 153 10*3/uL (ref 150–400)
Platelets: 133 10*3/uL — ABNORMAL LOW (ref 150–400)
RBC: 3.62 MIL/uL — AB (ref 3.87–5.11)
RBC: 3.37 MIL/uL — ABNORMAL LOW (ref 3.87–5.11)
RDW: 18.2 % — ABNORMAL HIGH (ref 11.5–15.5)
RDW: 18.2 % — AB (ref 11.5–15.5)
WBC: 14.7 10*3/uL — AB (ref 4.0–10.5)
WBC: 8.5 10*3/uL (ref 4.0–10.5)

## 2015-09-02 LAB — BASIC METABOLIC PANEL
Anion gap: 7 (ref 5–15)
BUN: 24 mg/dL — AB (ref 6–20)
CHLORIDE: 106 mmol/L (ref 101–111)
CO2: 27 mmol/L (ref 22–32)
Calcium: 8.3 mg/dL — ABNORMAL LOW (ref 8.9–10.3)
Creatinine, Ser: 1 mg/dL (ref 0.44–1.00)
GFR calc Af Amer: 60 mL/min — ABNORMAL LOW (ref >60)
GFR calc non Af Amer: 51 mL/min — ABNORMAL LOW (ref >60)
GLUCOSE: 103 mg/dL — AB (ref 65–99)
POTASSIUM: 5.6 mmol/L — AB (ref 3.5–5.1)
Sodium: 140 mmol/L (ref 135–145)

## 2015-09-02 LAB — GLUCOSE, CAPILLARY
GLUCOSE-CAPILLARY: 110 mg/dL — AB (ref 65–99)
GLUCOSE-CAPILLARY: 84 mg/dL (ref 65–99)
Glucose-Capillary: 121 mg/dL — ABNORMAL HIGH (ref 65–99)
Glucose-Capillary: 105 mg/dL — ABNORMAL HIGH (ref 65–99)
Glucose-Capillary: 91 mg/dL (ref 65–99)

## 2015-09-02 LAB — IRON AND TIBC
Iron: 37 ug/dL (ref 28–170)
SATURATION RATIOS: 11 % (ref 10.4–31.8)
TIBC: 333 ug/dL (ref 250–450)
UIBC: 296 ug/dL

## 2015-09-02 LAB — VITAMIN B12: VITAMIN B 12: 844 pg/mL (ref 180–914)

## 2015-09-02 LAB — BRAIN NATRIURETIC PEPTIDE: B NATRIURETIC PEPTIDE 5: 998.9 pg/mL — AB (ref 0.0–100.0)

## 2015-09-02 LAB — COMPREHENSIVE METABOLIC PANEL
ALBUMIN: 3.2 g/dL — AB (ref 3.5–5.0)
ALK PHOS: 158 U/L — AB (ref 38–126)
ALT: 32 U/L (ref 14–54)
ANION GAP: 7 (ref 5–15)
AST: 54 U/L — AB (ref 15–41)
BUN: 23 mg/dL — AB (ref 6–20)
CALCIUM: 8.7 mg/dL — AB (ref 8.9–10.3)
CO2: 28 mmol/L (ref 22–32)
Chloride: 104 mmol/L (ref 101–111)
Creatinine, Ser: 0.9 mg/dL (ref 0.44–1.00)
GFR calc Af Amer: >60 mL/min (ref >60)
GFR calc non Af Amer: 58 mL/min — ABNORMAL LOW (ref >60)
GLUCOSE: 122 mg/dL — AB (ref 65–99)
Potassium: 5.8 mmol/L — ABNORMAL HIGH (ref 3.5–5.1)
SODIUM: 139 mmol/L (ref 135–145)
Total Bilirubin: 1.5 mg/dL — ABNORMAL HIGH (ref 0.3–1.2)
Total Protein: 6.8 g/dL (ref 6.5–8.1)

## 2015-09-02 LAB — INFLUENZA PANEL BY PCR (TYPE A & B)
H1N1 flu by pcr: NOT DETECTED
Influenza A By PCR: NEGATIVE
Influenza B By PCR: NEGATIVE

## 2015-09-02 LAB — LIPID PANEL
CHOL/HDL RATIO: 4.1 ratio
Cholesterol: 157 mg/dL (ref 0–200)
HDL: 38 mg/dL — AB (ref >40)
LDL CALC: 86 mg/dL (ref 0–99)
Triglycerides: 166 mg/dL — ABNORMAL HIGH (ref <150)
VLDL: 33 mg/dL (ref 0–40)

## 2015-09-02 LAB — TROPONIN I
TROPONIN I: 0.06 ng/mL — AB (ref <0.031)
Troponin I: 0.08 ng/mL — ABNORMAL HIGH (ref <0.031)

## 2015-09-02 LAB — HEMOGLOBIN A1C
Hgb A1c MFr Bld: 5.6 % (ref 4.8–5.6)
Mean Plasma Glucose: 114 mg/dL

## 2015-09-02 LAB — PROTIME-INR
INR: 1.07 (ref 0.00–1.49)
PROTHROMBIN TIME: 14.1 s (ref 11.6–15.2)

## 2015-09-02 LAB — FERRITIN: FERRITIN: 31 ng/mL (ref 11–307)

## 2015-09-02 MED ORDER — LEVALBUTEROL HCL 0.63 MG/3ML IN NEBU
0.6300 mg | INHALATION_SOLUTION | Freq: Three times a day (TID) | RESPIRATORY_TRACT | Status: DC | PRN
  Administered 2015-09-02 – 2015-09-05 (×5): 0.63 mg via RESPIRATORY_TRACT
  Filled 2015-09-02 (×5): qty 3

## 2015-09-02 MED ORDER — OSELTAMIVIR PHOSPHATE 30 MG PO CAPS
30.0000 mg | ORAL_CAPSULE | Freq: Two times a day (BID) | ORAL | Status: DC
  Administered 2015-09-02 – 2015-09-03 (×2): 30 mg via ORAL
  Filled 2015-09-02 (×2): qty 1

## 2015-09-02 MED ORDER — PERFLUTREN LIPID MICROSPHERE
INTRAVENOUS | Status: AC
Start: 2015-09-02 — End: 2015-09-02
  Filled 2015-09-02: qty 10

## 2015-09-02 MED ORDER — INSULIN ASPART 100 UNIT/ML ~~LOC~~ SOLN: 0.0000 [IU] | Freq: Three times a day (TID) | SUBCUTANEOUS | Status: DC

## 2015-09-02 MED ORDER — LEVALBUTEROL HCL 0.63 MG/3ML IN NEBU
0.6300 mg | INHALATION_SOLUTION | Freq: Once | RESPIRATORY_TRACT | Status: AC
  Administered 2015-09-02: 0.63 mg via RESPIRATORY_TRACT

## 2015-09-02 MED ORDER — LACTULOSE 10 GM/15ML PO SOLN
30.0000 g | Freq: Once | ORAL | Status: AC
Start: 2015-09-02 — End: 2015-09-02
  Administered 2015-09-02: 30 g via ORAL
  Filled 2015-09-02: qty 45

## 2015-09-02 MED ORDER — PERFLUTREN LIPID MICROSPHERE
1.0000 mL | INTRAVENOUS | Status: AC | PRN
  Administered 2015-09-02: 2 mL via INTRAVENOUS
  Filled 2015-09-02: qty 10

## 2015-09-02 MED ORDER — VANCOMYCIN HCL 10 G IV SOLR
1500.0000 mg | Freq: Once | INTRAVENOUS | Status: AC
  Administered 2015-09-02: 1500 mg via INTRAVENOUS
  Filled 2015-09-02: qty 1500

## 2015-09-02 MED ORDER — LEVOFLOXACIN IN D5W 750 MG/150ML IV SOLN
750.0000 mg | INTRAVENOUS | Status: DC
  Administered 2015-09-02 – 2015-09-03 (×2): 750 mg via INTRAVENOUS
  Filled 2015-09-02 (×2): qty 150

## 2015-09-02 MED ORDER — LEVALBUTEROL HCL 0.63 MG/3ML IN NEBU
INHALATION_SOLUTION | RESPIRATORY_TRACT | Status: AC
  Administered 2015-09-02: 0.63 mg via RESPIRATORY_TRACT
  Filled 2015-09-02: qty 3

## 2015-09-02 MED ORDER — FUROSEMIDE 10 MG/ML IJ SOLN
40.0000 mg | Freq: Two times a day (BID) | INTRAMUSCULAR | Status: DC
  Administered 2015-09-02 – 2015-09-03 (×2): 40 mg via INTRAVENOUS
  Filled 2015-09-02 (×2): qty 4

## 2015-09-02 MED ORDER — FUROSEMIDE 10 MG/ML IJ SOLN
40.0000 mg | Freq: Once | INTRAMUSCULAR | Status: AC
  Administered 2015-09-02: 40 mg via INTRAVENOUS
  Filled 2015-09-02: qty 4

## 2015-09-02 MED ORDER — FUROSEMIDE 10 MG/ML IJ SOLN
20.0000 mg | Freq: Once | INTRAMUSCULAR | Status: AC
  Administered 2015-09-02: 20 mg via INTRAVENOUS
  Filled 2015-09-02: qty 2

## 2015-09-02 MED ORDER — OSELTAMIVIR PHOSPHATE 75 MG PO CAPS
75.0000 mg | ORAL_CAPSULE | Freq: Two times a day (BID) | ORAL | Status: DC
  Administered 2015-09-02: 75 mg via ORAL
  Filled 2015-09-02: qty 1

## 2015-09-02 MED ORDER — VANCOMYCIN HCL IN DEXTROSE 750-5 MG/150ML-% IV SOLN
750.0000 mg | Freq: Two times a day (BID) | INTRAVENOUS | Status: DC
  Administered 2015-09-02 – 2015-09-03 (×3): 750 mg via INTRAVENOUS
  Filled 2015-09-02 (×4): qty 150

## 2015-09-02 NOTE — Progress Notes (Signed)
IP PROGRESS NOTE  Subjective:   She appears comfortable. Her daughter reports [name] feels better this morning.   Objective: Vital signs in last 24 hours: Blood pressure 157/57, pulse 75, temperature 100.8 F (38.2 C), temperature source Rectal, resp. rate 26, height _0  (1.575 m), weight 199 lb 4.7 oz (90.4 kg), SpO2 96 %.  Intake/Output from previous day: 12/08 0701 - 12/09 0700 In: 395 [Blood:395] Out: -   Physical Exam:   Lungs:  mild bilateral wheeze Cardiac:  regular rate and rhythm, 2/6 systolic murmur Abdomen:  soft and nontender, no hepatosplenomegaly Extremities:  trace ankle edema bilaterally   Portacath/PICC-without erythema  Lab Results:  Recent Labs  09/01/15 2009 09/02/15 0715  WBC 6.2 14.7*  HGB 7.9* 10.2*  HCT 26.6* 33.4*  PLT 135* 153    BMET  Recent Labs  09/01/15 2009 09/02/15 0715  NA 140 139  K 5.8* 5.8*  CL 107 104  CO2 29 28  GLUCOSE 100* 122*  BUN 23* 23*  CREATININE 0.95 0.90  CALCIUM 8.5* 8.7*    Studies/Results: Dg Chest Port 1 View  09/02/2015  CLINICAL DATA:  81 year old female with fever and hypoxia today. EXAM: PORTABLE CHEST 1 VIEW COMPARISON:  Chest x-ray 09/01/2015. FINDINGS: Lung volumes are slightly low. Opacity at the left base may reflect atelectasis and/or consolidation. Small left pleural effusion. Diffuse interstitial prominence, increased compared to the prior study. Cephalization of the pulmonary vasculature. Moderate cardiomegaly. Extensive atherosclerosis in the thoracic aorta. Status post median sternotomy for CABG, including LIMA. IMPRESSION: 1. The appearance of the chest suggests worsening congestive heart failure, as above. 2. Atelectasis and/or consolidation in the left lower lobe with small left-sided pleural effusion. 3. Extensive atherosclerosis. Electronically Signed   By: Vinnie Langton M.D.   On: 09/02/2015 09:41   Dg Chest Port 1 View  09/01/2015  CLINICAL DATA:  Shortness of breath. History  of hypertension, diabetes and congestive heart failure. EXAM: PORTABLE CHEST 1 VIEW COMPARISON:  10/18/2011 and 08/20/2015. FINDINGS: 1931 hours. Stable cardiomegaly and aortic atherosclerosis status post median sternotomy and CABG. There is chronic vascular congestion without overt pulmonary edema. There is increased density at the left lung base which is probably due to a combination of atelectasis and pleural thickening. There may be a small amount of pleural fluid. No acute osseous findings seen. IMPRESSION: Chronic cardiomegaly and chronic vascular congestion. New patchy left basilar opacity, probably atelectasis. Early aspiration would be a consideration in the appropriate clinical context. Electronically Signed   By: Richardean Sale M.D.   On: 09/01/2015 19:39    Medications: I have reviewed the patient's current medications.  Assessment/Plan:  1. Anemia, status post one unit of packed red blood cells 09/01/2015-improved. She was admitted with severe anemia requiring transfusion of packed red blood cells at [facility] 08/20/2015. 2. History of coronary artery disease 3. Congestive heart failure 4. 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 Admission with pneumonia and a CHF exacerbation in December 2012 6. History of mild from cytopenia 7. Hemoccult positive stool at [facility] November 2016   She appears stable to smaller. The hemoglobin has improved following the red cell transfusion. It is possible the anemia is related to iron deficiency and chronic GI blood loss, but I recommend proceeding with a diagnostic bone marrow biopsy to rule out myelodysplasia, a myeloproliferative disorder, or a malignancy.  The chest x-ray today suggests CHF.   Recommendations: 1. Management of CHF/respiratory failure per medicine 2. Diagnostic  bone marrow biopsy when this can be performed by interventional radiology 3. Check stool Hemoccult 4. Follow-up  09/26/2014 at the [facility] as scheduled  Please call Oncology over the weekend as needed. I will check on her 09/05/2015 if she remains in the hospital.  I appreciate the care from Dr. Wynelle Cleveland     LOS: 1 day   [facility], [place]  09/02/2015, 10:32 AM

## 2015-09-02 NOTE — Progress Notes (Signed)
Echocardiogram 2D Echocardiogram with Definity has been performed.  [name], [name] 09/02/2015, 1:49 PM

## 2015-09-02 NOTE — Progress Notes (Signed)
Patient refused to be swabbed for flu, educated patient/family, and have the charge nurse talk to them as well, family stated to try later. Dr. Butler Denmarkizwan notified.

## 2015-09-02 NOTE — Progress Notes (Signed)
IR PA aware of request for image guided bone marrow biopsy. The procedure is unable to be performed today, if the patient is still in the hospital we can perform it next week, otherwise this can be ordered and performed as an outpatient.   Tsosie Billing PA-C Interventional Radiology  09/02/15  4:09 PM

## 2015-09-02 NOTE — Progress Notes (Signed)
[facility] Progress Note   [name]  FXT:[id number]  DOB: 04/22/1931  DOA: 09/01/2015 PCP: Abigail Miyamoto, MD  Brief narrative/ Subjective: [name] is a 81 y.o. female with a history of CVA who presents to the hospital for generalized weakness. She was recently admitted at [facility] for syncope and anemia and was given 2 units of packed red blood cells in November. On presentation to the ER her hemoglobin is 6.1. She was referred to oncology for further workup of the anemia and was evaluated on 12/8. Oncology recommended that she be admitted for transfusion of 2 units of packed red blood cells. A bone marrow biopsy was also recommended. Upon receiving the first unit of packed red blood cells, the patient became severely short of breath and was noted to have pulmonary edema. She was given IV Lasix and notes that breathing has improved today. Her daughter is at bedside & is translating. She sound congested to me on exam and admits to a cough for the past 2 days. She has not noted any fevers or sore throat or runny nose.  Assessment/Plan: Principal Problem:   Anemia - given 1 U PRBC- Hb rose from 7.9 to 10.2 which is usual- repeat CBC later today - bone marrow biopsy today  Active Problems: Fever/ cough / congestion>>> HCAP  - CXR reveals worsening LLL infiltrate- start Levaqun + Vanc - allergic to PCN - refused flu swab  Pulmonary edema/ mild troponin elevation - last ECHO (2012) revealed EF of 40-45 %- Demedex held during recent admission due to dehydration- daughter admits patient has gained weight - will continue to diurese- repeating ECHO - no chest pain   Hyperkalemia - give Lactulose - recheck later today    Diabetes mellitus 2 - cont sliding scale     GERD (gastroesophageal reflux disease) - Protonix    Essential hypertension - Imdur, Metoprolol  H/o CVA - on Plavix at home- on hold at this time   Code Status:     Code  Status Orders        Start     Ordered   09/01/15 1945  Full code   Continuous     09/01/15 1944    Advance Directive Documentation        Most Recent Value   Type of Advance Directive  Healthcare Power of Attorney, Living will   Pre-existing out of facility DNR order (yellow form or pink MOST form)     "MOST" Form in Place?       Family Communication:   Disposition Plan: cont to follow DVT prophylaxis: SCDs Consultants:  Procedures:   Antibiotics: Anti-infectives    Start     Dose/Rate Route Frequency Ordered Stop   09/02/15 2200  vancomycin (VANCOCIN) IVPB 750 mg/150 ml premix     750 mg 150 mL/hr over 60 Minutes Intravenous Every 12 hours 09/02/15 0926     09/02/15 2200  oseltamivir (TAMIFLU) capsule 30 mg     30 mg Oral 2 times daily 09/02/15 1350 09/07/15 0959   09/02/15 1000  oseltamivir (TAMIFLU) capsule 75 mg  Status:  Discontinued     75 mg Oral 2 times daily 09/02/15 0901 09/02/15 1350   09/02/15 1000  vancomycin (VANCOCIN) 1,500 mg in sodium chloride 0.9 % 500 mL IVPB     1,500 mg 250 mL/hr over 120 Minutes Intravenous  Once 09/02/15 0923 09/02/15 1148   09/02/15 1000  levofloxacin (LEVAQUIN) IVPB 750 mg  750 mg 100 mL/hr over 90 Minutes Intravenous Every 24 hours 09/02/15 0925        Objective: Filed Weights   09/01/15 2146 09/02/15 0419  Weight: 90.81 kg (200 lb 3.2 oz) 90.4 kg (199 lb 4.7 oz)    Intake/Output Summary (Last 24 hours) at 09/02/15 1448 Last data filed at 09/02/15 1100  Gross per 24 hour  Intake   1048 ml  Output      0 ml  Net   1048 ml     Vitals Filed Vitals:   09/02/15 0127 09/02/15 0419 09/02/15 0501 09/02/15 0906  BP: 157/73 180/70  157/57  Pulse: 63 80  75  Temp: 98.4 F (36.9 C) 97.9 F (36.6 C)  100.8 F (38.2 C)  TempSrc: Oral Oral  Rectal  Resp: _0 Height:      Weight:  90.4 kg (199 lb 4.7 oz)    SpO2: 100% 100% 96% 96%    Exam:  General:  Pt is alert, not in acute distress  HEENT: No icterus,  No thrush, oral mucosa moist  Cardiovascular: regular rate and rhythm, S1/S2 No murmur  Respiratory: rhonchi, no wheezing - no crackles- poor air entry  Abdomen: Soft, +Bowel sounds, non tender, non distended, no guarding  MSK: No LE edema, cyanosis or clubbing  Data Reviewed: Basic Metabolic Panel:  Recent Labs Lab 09/01/15 2009 09/02/15 0715  NA 140 139  K 5.8* 5.8*  CL 107 104  CO2 29 28  GLUCOSE 100* 122*  BUN 23* 23*  CREATININE 0.95 0.90  CALCIUM 8.5* 8.7*   Liver Function Tests:  Recent Labs Lab 09/01/15 2009 09/02/15 0715  AST 44*  44* 54*  ALT 27  26 32  ALKPHOS 144*  147* 158*  BILITOT 0.8  0.6 1.5*  PROT 6.5  6.3* 6.8  ALBUMIN 3.0*  2.8* 3.2*   No results for input(s): LIPASE, AMYLASE in the last 168 hours. No results for input(s): AMMONIA in the last 168 hours. CBC:  Recent Labs Lab 09/01/15 1555 09/01/15 2009 09/02/15 0715  WBC 5.9 6.2 14.7*  NEUTROABS 3.9 4.1  --   HGB 8.0* 7.9* 10.2*  HCT 26.5* 26.6* 33.4*  MCV 90.0 94.7 92.3  PLT 149 135* 153   Cardiac Enzymes:  Recent Labs Lab 09/01/15 2009 09/02/15 0715  TROPONINI 0.03 0.06*   BNP (last 3 results)  Recent Labs  09/02/15 0715  BNP 998.9*    ProBNP (last 3 results) No results for input(s): PROBNP in the last 8760 hours.  CBG:  Recent Labs Lab 09/02/15 0022 09/02/15 0615 09/02/15 0740  GLUCAP 84 121* 110*    No results found for this or any previous visit (from the past 240 hour(s)).   Studies: Dg Chest Port 1 View  09/02/2015  CLINICAL DATA:  81 year old female with fever and hypoxia today. EXAM: PORTABLE CHEST 1 VIEW COMPARISON:  Chest x-ray 09/01/2015. FINDINGS: Lung volumes are slightly low. Opacity at the left base may reflect atelectasis and/or consolidation. Small left pleural effusion. Diffuse interstitial prominence, increased compared to the prior study. Cephalization of the pulmonary vasculature. Moderate cardiomegaly. Extensive atherosclerosis in  the thoracic aorta. Status post median sternotomy for CABG, including LIMA. IMPRESSION: 1. The appearance of the chest suggests worsening congestive heart failure, as above. 2. Atelectasis and/or consolidation in the left lower lobe with small left-sided pleural effusion. 3. Extensive atherosclerosis. Electronically Signed   By: Vinnie Langton M.D.   On: 09/02/2015 09:41  Dg Chest Port 1 View  09/01/2015  CLINICAL DATA:  Shortness of breath. History of hypertension, diabetes and congestive heart failure. EXAM: PORTABLE CHEST 1 VIEW COMPARISON:  10/18/2011 and 08/20/2015. FINDINGS: 1931 hours. Stable cardiomegaly and aortic atherosclerosis status post median sternotomy and CABG. There is chronic vascular congestion without overt pulmonary edema. There is increased density at the left lung base which is probably due to a combination of atelectasis and pleural thickening. There may be a small amount of pleural fluid. No acute osseous findings seen. IMPRESSION: Chronic cardiomegaly and chronic vascular congestion. New patchy left basilar opacity, probably atelectasis. Early aspiration would be a consideration in the appropriate clinical context. Electronically Signed   By: Richardean Sale M.D.   On: 09/01/2015 19:39    Scheduled Meds:  Scheduled Meds: . sodium chloride   Intravenous Once  . allopurinol  100 mg Oral Daily  . atorvastatin  40 mg Oral q1800  . colchicine  0.6 mg Oral Daily  . folic acid  1 mg Oral Daily  . insulin aspart  0-15 Units Subcutaneous 6 times per day  . isosorbide mononitrate  60 mg Oral Daily  . levofloxacin (LEVAQUIN) IV  750 mg Intravenous Q24H  . metoprolol  50 mg Oral BID  . multivitamin with minerals  1 tablet Oral Daily  . oseltamivir  30 mg Oral BID  . pantoprazole  40 mg Oral Daily  . primidone  50 mg Oral Daily  . senna  1 tablet Oral BID  . sodium chloride  3 mL Intravenous Q12H  . thiamine  100 mg Oral Daily  . vancomycin  750 mg Intravenous Q12H    Continuous Infusions: . sodium chloride 10 mL/hr at 09/02/15 0708    Time spent on care of this patient: 73 min   [place], MD 09/02/2015, 2:48 PM  LOS: 1 day   [facility] Office  [phone] Pager - Text Page per [email or web address] If 7PM-7AM, please contact night-coverage [email or web address]

## 2015-09-02 NOTE — Progress Notes (Signed)
ANTIBIOTIC CONSULT NOTE - INITIAL  Pharmacy Consult for levaquin and vancomycin Indication: HCAP/pneumonia  Allergies  Allergen Reactions  . Penicillins Anaphylaxis, Itching and Swelling    Has patient had a PCN reaction causing immediate rash, facial/tongue/throat swelling, SOB or lightheadedness with hypotension: Yes Has patient had a PCN reaction causing severe rash involving mucus membranes or skin necrosis: No  Has patient had a PCN reaction that required hospitalization: Unknown Has patient had a PCN reaction occurring within the last 10 years: No  If all of the above answers are "NO", then may proceed with Cephalosporin use.     Patient Measurements: Height: 5\' 2"  (157.5 cm) Weight: 199 lb 4.7 oz (90.4 kg) IBW/kg (Calculated) : 50.1   Vital Signs: Temp: 100.8 F (38.2 C) (12/09 0906) Temp Source: Rectal (12/09 0906) BP: 157/57 mmHg (12/09 0906) Pulse Rate: 75 (12/09 0906) Intake/Output from previous day: 12/08 0701 - 12/09 0700 In: 395 [Blood:395] Out: -  Intake/Output from this shift:    Labs:  Recent Labs  09/01/15 1555 09/01/15 2009 09/02/15 0715  WBC 5.9 6.2 14.7*  HGB 8.0* 7.9* 10.2*  PLT 149 135* 153  CREATININE  --  0.95 0.90   Estimated Creatinine Clearance: 51.2 mL/min (by C-G formula based on Cr of 0.9). No results for input(s): VANCOTROUGH, VANCOPEAK, VANCORANDOM, GENTTROUGH, GENTPEAK, GENTRANDOM, TOBRATROUGH, TOBRAPEAK, TOBRARND, AMIKACINPEAK, AMIKACINTROU, AMIKACIN in the last 72 hours.   Microbiology: No results found for this or any previous visit (from the past 720 hour(s)).  Medical History: Past Medical History  Diagnosis Date  . Diabetes mellitus   . Hypertension   . Stroke [facility](HCC) Right Side Weakness  . CHF (congestive heart failure) (HCC)     2D ECHO, 09/13/2011 - EF 40-45%, normal  . Chest pain     NUCLEAR STRESS TEST, 08/08/2012 - reversible defect involving the lateral inferior wall, findings are concerning for  pharmacologically induced ischemis in this area, EF 65%  . Pain in limb     BILATERAL EXTREMITY VENOUS DUPLEX, 01/08/2011 - no evidence of deep vein or superficial thrombosis or Baker's cyst    Assessment: 81 y.o. Female with prior hx of CVA presented to hospital from oncology clinic with weakness and anemia.  Scr 0.9, CrCl ~ 5254mls/min (N)  Goal of Therapy:  Vancomycin trough level 15-20 mcg/ml  Plan:  Vancomycin 1500mg  IV x 1 then 750mg  IV q12h levaquin 750mg  IV q24h Follow renal function, cultures, clinical course  Arley Phenixllen Shannell Mikkelsen RPh 09/02/2015, 9:27 AM Pager [phone]

## 2015-09-03 DIAGNOSIS — I5021 Acute systolic (congestive) heart failure: Secondary | ICD-10-CM

## 2015-09-03 DIAGNOSIS — J189 Pneumonia, unspecified organism: Secondary | ICD-10-CM

## 2015-09-03 LAB — BASIC METABOLIC PANEL
ANION GAP: 6 (ref 5–15)
BUN: 23 mg/dL — ABNORMAL HIGH (ref 6–20)
CALCIUM: 8.5 mg/dL — AB (ref 8.9–10.3)
CO2: 29 mmol/L (ref 22–32)
CREATININE: 1.05 mg/dL — AB (ref 0.44–1.00)
Chloride: 104 mmol/L (ref 101–111)
GFR, EST AFRICAN AMERICAN: 56 mL/min — AB (ref >60)
GFR, EST NON AFRICAN AMERICAN: 48 mL/min — AB (ref >60)
Glucose, Bld: 113 mg/dL — ABNORMAL HIGH (ref 65–99)
Potassium: 4.7 mmol/L (ref 3.5–5.1)
SODIUM: 139 mmol/L (ref 135–145)

## 2015-09-03 LAB — GLUCOSE, CAPILLARY
GLUCOSE-CAPILLARY: 92 mg/dL (ref 65–99)
GLUCOSE-CAPILLARY: 112 mg/dL — AB (ref 65–99)
Glucose-Capillary: 88 mg/dL (ref 65–99)

## 2015-09-03 MED ORDER — LACTULOSE 10 GM/15ML PO SOLN
30.0000 g | Freq: Once | ORAL | Status: AC
  Administered 2015-09-03: 30 g via ORAL
  Filled 2015-09-03: qty 45

## 2015-09-03 MED ORDER — LEVOFLOXACIN IN D5W 750 MG/150ML IV SOLN: 750.0000 mg | INTRAVENOUS | Status: DC

## 2015-09-03 MED ORDER — TORSEMIDE 20 MG PO TABS
20.0000 mg | ORAL_TABLET | Freq: Two times a day (BID) | ORAL | Status: DC
Start: 2015-09-03 — End: 2015-09-04
  Administered 2015-09-03: 20 mg via ORAL
  Filled 2015-09-03 (×3): qty 1

## 2015-09-03 NOTE — Progress Notes (Addendum)
ANTIBIOTIC CONSULT NOTE  Pharmacy Consult for levaquin and vancomycin Indication: HCAP/pneumonia  Allergies  Allergen Reactions  . Penicillins Anaphylaxis, Itching and Swelling    Has patient had a PCN reaction causing immediate rash, facial/tongue/throat swelling, SOB or lightheadedness with hypotension: Yes Has patient had a PCN reaction causing severe rash involving mucus membranes or skin necrosis: No  Has patient had a PCN reaction that required hospitalization: Unknown Has patient had a PCN reaction occurring within the last 10 years: No  If all of the above answers are "NO", then may proceed with Cephalosporin use.     Patient Measurements: Height: 5\' 2"  (157.5 cm) Weight: 195 lb 9.6 oz (88.724 kg) IBW/kg (Calculated) : 50.1   Vital Signs: Temp: 98.1 F (36.7 C) (12/10 0628) Temp Source: Oral (12/10 0628) BP: 135/47 mmHg (12/10 0628) Pulse Rate: 77 (12/10 0628) Intake/Output from previous day: 12/09 0701 - 12/10 0700 In: 833 [P.O.:30; I.V.:3; IV Piggyback:800] Out: -  Intake/Output from this shift:    Labs:  Recent Labs  09/01/15 2009 09/02/15 0715 09/02/15 1537  WBC 6.2 14.7* 8.5  HGB 7.9* 10.2* 9.3*  PLT 135* 153 133*  CREATININE 0.95 0.90 1.00   Estimated Creatinine Clearance: 45.6 mL/min (by C-G formula based on Cr of 1). No results for input(s): VANCOTROUGH, VANCOPEAK, VANCORANDOM, GENTTROUGH, GENTPEAK, GENTRANDOM, TOBRATROUGH, TOBRAPEAK, TOBRARND, AMIKACINPEAK, AMIKACINTROU, AMIKACIN in the last 72 hours.   Microbiology: No results found for this or any previous visit (from the past 720 hour(s)).  Medical History: Past Medical History  Diagnosis Date  . Diabetes mellitus   . Hypertension   . Stroke [facility](HCC) Right Side Weakness  . CHF (congestive heart failure) (HCC)     2D ECHO, 09/13/2011 - EF 40-45%, normal  . Chest pain     NUCLEAR STRESS TEST, 08/08/2012 - reversible defect involving the lateral inferior wall, findings are concerning for  pharmacologically induced ischemis in this area, EF 65%  . Pain in limb     BILATERAL EXTREMITY VENOUS DUPLEX, 01/08/2011 - no evidence of deep vein or superficial thrombosis or Baker's cyst    Assessment: [name] with prior hx of CVA presented to hospital from oncology clinic with weakness and anemia. [facility] hospitalized [facility] in November with syncope.  Pharmacy consulted to dose vancomycin and levaquin (PCN allergy) for HCAP as CXR reveals worsening LLL infiltrate.  Antimicrobials this admission: 12/9 >> Vanc >> 12/9 >> Levaquin >> 12/9 >> Tamiflu >>  Levels/dose changes this admission: --  Microbiology Results: 12/9 influenza panel: neg  Goal of Therapy:  Vancomycin trough level 15-20 mcg/ml  Doses appropriate for indication and renal function  Plan:   Continue vancomycin 750mg  IV q12h (received 1500mg  loading dose 12/9)  Decrease levaquin to 750mg  IV q48h for CrCl < 50 ml/min - next dose 12/12  Follow renal function, cultures, clinical course  Consider dc Tamiflu since influenza panel negative  Loralee PacasErin Lawyer Washabaugh, PharmD, BCPS Pager: [phone]  09/03/2015, 1:13 PM

## 2015-09-03 NOTE — Progress Notes (Signed)
[facility] Progress Note   LUIZA CARRANCO  RKY:[id number]  DOB: 1931-08-26  DOA: 09/01/2015 PCP: Abigail Miyamoto, MD  Brief narrative: [name] is a 81 y.o. female with a history of CVA, chronic systolic heart failure, on Oxygen at home, essential tremor, diabetes mellitus2, gout, morbid obesity who is wheelchair bound who presents to the hospital for anemia and generalized weakness. She was admitted at [facility] for vomiting and syncope from 11/26 through 11/29. During that admission she was noted to be dehydrated and diuretics were held. She was transfused 2 units of packed red blood cells on 11/27 for hemoglobin of 6.1 which had dropped from 8.3 in 24 hours. No etiology for this drop was found.   She was referred to oncology for further workup of the anemia and was evaluated on 12/8. Oncology recommended that she be admitted for transfusion of 2 units of packed red blood cells. A bone marrow biopsy was also recommended.On presentation to the ER her hemoglobin is 7.9 on 12/8.  Upon receiving the first unit of packed red blood cells, the patient became severely short of breath and was noted to have pulmonary edema. She was given IV Lasix with resultant improvement Upon my eval on 12/9, she admitted to cough with congestion for 2-3 days. She was also noted to be febrile with a rectal temperature of 100.8.  Subjective:  Assessment/Plan: Principal Problem:   Anemia-etiology undetermined - sent to ER by oncology for transfusion - given 1 U PRBC- Hb was 7.9 pre-transfusion- now 9.3 - Oncology recommends a bone marrow biopsy which has been postponed until Monday by IR  Active Problems: Fever/ cough / congestion>>> HCAP  - CXR reveals worsening LLL infiltrate- started Levaqun + Vanc - allergic to PCN - refused flu swab- started Tamiflu- fevers resolving- cough improving but still is quite congested  Pulmonary edema/ mild troponin elevation- CAD status post  CABG 1 - last ECHO (2012) revealed EF of 40-45 %- Demedex held during recent admission due to dehydration and BUN in 70-80 range - daughter admits patient has gained weight - repeat CXR 12/9 showed worseing pulm edema - repeate ECHO reveals EF of 50%, mild LVH, Gr 1 D dysf, Mod MR and focal hypokinesis - no chest pain  -Cut back on diuretics today  Chronic resp failure - uses O2 at home  Hyperkalemia - gave Lactulose x 2 days and despite having BMs, K still elevated at 5.6 (despite Lasix) -give another dose of lactulose today- recheck later today    Diabetes mellitus 2 - cont sliding scale     GERD (gastroesophageal reflux disease) - Protonix    Essential hypertension - Imdur, Metoprolol  H/o CVA and CABG - on Plavix at home- on hold at this time   Code Status:     Code Status Orders        Start     Ordered   09/01/15 1945  Full code   Continuous     09/01/15 1944    Advance Directive Documentation        Most Recent Value   Type of Advance Directive  Healthcare Power of Attorney, Living will   Pre-existing out of facility DNR order (yellow form or pink MOST form)     "MOST" Form in Place?       Family Communication:   Disposition Plan: cont to follow DVT prophylaxis: SCDs Consultants:  Procedures:  Normal LV size with mild LV hypertrophy. EF 50%. Basal  inferior and basal inferolateral hypokinesis. Septal bounce consistent with LBBB. RV poorly visualized but probably normal size/systolic function. Moderate MR. Antibiotics: Anti-infectives    Start     Dose/Rate Route Frequency Ordered Stop   09/02/15 2200  vancomycin (VANCOCIN) IVPB 750 mg/150 ml premix     750 mg 150 mL/hr over 60 Minutes Intravenous Every 12 hours 09/02/15 0926     09/02/15 2200  oseltamivir (TAMIFLU) capsule 30 mg     30 mg Oral 2 times daily 09/02/15 1350 09/07/15 0959   09/02/15 1000  oseltamivir (TAMIFLU) capsule 75 mg  Status:  Discontinued     75 mg Oral 2 times daily  09/02/15 0901 09/02/15 1350   09/02/15 1000  vancomycin (VANCOCIN) 1,500 mg in sodium chloride 0.9 % 500 mL IVPB     1,500 mg 250 mL/hr over 120 Minutes Intravenous  Once 09/02/15 0923 09/02/15 1148   09/02/15 1000  levofloxacin (LEVAQUIN) IVPB 750 mg     750 mg 100 mL/hr over 90 Minutes Intravenous Every 24 hours 09/02/15 0925        Objective: Filed Weights   09/02/15 0419 09/03/15 0628 09/03/15 0824  Weight: 90.4 kg (199 lb 4.7 oz) 90.719 kg (200 lb) 88.724 kg (195 lb 9.6 oz)    Intake/Output Summary (Last 24 hours) at 09/03/15 1117 Last data filed at 09/03/15 0500  Gross per 24 hour  Intake    180 ml  Output      0 ml  Net    180 ml     Vitals Filed Vitals:   09/02/15 2100 09/02/15 2219 09/03/15 0628 09/03/15 0824  BP: 153/59  135/47   Pulse: 67  77   Temp: 98.3 F (36.8 C)  98.1 F (36.7 C)   TempSrc: Oral  Oral   Resp: 24  22   Height:      Weight:   90.719 kg (200 lb) 88.724 kg (195 lb 9.6 oz)  SpO2: 100% 99% 100%     Exam:  General:  Pt is alert, not in acute distress  HEENT: No icterus, No thrush, oral mucosa moist  Cardiovascular: regular rate and rhythm, S1/S2 No murmur  Respiratory: rhonchi, no wheezing - no crackles- poor air entry  Abdomen: Soft, +Bowel sounds, non tender, non distended, no guarding  MSK: No LE edema, cyanosis or clubbing  Data Reviewed: Basic Metabolic Panel:  Recent Labs Lab 09/01/15 2009 09/02/15 0715 09/02/15 1537  NA 140 139 140  K 5.8* 5.8* 5.6*  CL 107 104 106  CO2 $Re'29 28 27  'RbV$ GLUCOSE 100* 122* 103*  BUN 23* 23* 24*  CREATININE 0.95 0.90 1.00  CALCIUM 8.5* 8.7* 8.3*   Liver Function Tests:  Recent Labs Lab 09/01/15 2009 09/02/15 0715  AST 44*  44* 54*  ALT 27  26 32  ALKPHOS 144*  147* 158*  BILITOT 0.8  0.6 1.5*  PROT 6.5  6.3* 6.8  ALBUMIN 3.0*  2.8* 3.2*   No results for input(s): LIPASE, AMYLASE in the last 168 hours. No results for input(s): AMMONIA in the last 168 hours. CBC:  Recent  Labs Lab 09/01/15 1555 09/01/15 2009 09/02/15 0715 09/02/15 1537  WBC 5.9 6.2 14.7* 8.5  NEUTROABS 3.9 4.1  --   --   HGB 8.0* 7.9* 10.2* 9.3*  HCT 26.5* 26.6* 33.4* 30.6*  MCV 90.0 94.7 92.3 90.8  PLT 149 135* 153 133*   Cardiac Enzymes:  Recent Labs Lab 09/01/15 2009 09/02/15 0715 09/02/15 1537  TROPONINI 0.03 0.06* 0.08*   BNP (last 3 results)  Recent Labs  09/02/15 0715  BNP 998.9*    ProBNP (last 3 results) No results for input(s): PROBNP in the last 8760 hours.  CBG:  Recent Labs Lab 09/02/15 0615 09/02/15 0740 09/02/15 1250 09/02/15 1710 09/03/15 0727  GLUCAP 121* 110* 105* 91 88    No results found for this or any previous visit (from the past 240 hour(s)).   Studies: Dg Chest Port 1 View  09/02/2015  CLINICAL DATA:  81 year old female with fever and hypoxia today. EXAM: PORTABLE CHEST 1 VIEW COMPARISON:  Chest x-ray 09/01/2015. FINDINGS: Lung volumes are slightly low. Opacity at the left base may reflect atelectasis and/or consolidation. Small left pleural effusion. Diffuse interstitial prominence, increased compared to the prior study. Cephalization of the pulmonary vasculature. Moderate cardiomegaly. Extensive atherosclerosis in the thoracic aorta. Status post median sternotomy for CABG, including LIMA. IMPRESSION: 1. The appearance of the chest suggests worsening congestive heart failure, as above. 2. Atelectasis and/or consolidation in the left lower lobe with small left-sided pleural effusion. 3. Extensive atherosclerosis. Electronically Signed   By: Vinnie Langton M.D.   On: 09/02/2015 09:41   Dg Chest Port 1 View  09/01/2015  CLINICAL DATA:  Shortness of breath. History of hypertension, diabetes and congestive heart failure. EXAM: PORTABLE CHEST 1 VIEW COMPARISON:  10/18/2011 and 08/20/2015. FINDINGS: 1931 hours. Stable cardiomegaly and aortic atherosclerosis status post median sternotomy and CABG. There is chronic vascular congestion without  overt pulmonary edema. There is increased density at the left lung base which is probably due to a combination of atelectasis and pleural thickening. There may be a small amount of pleural fluid. No acute osseous findings seen. IMPRESSION: Chronic cardiomegaly and chronic vascular congestion. New patchy left basilar opacity, probably atelectasis. Early aspiration would be a consideration in the appropriate clinical context. Electronically Signed   By: Richardean Sale M.D.   On: 09/01/2015 19:39    Scheduled Meds:  Scheduled Meds: . sodium chloride   Intravenous Once  . allopurinol  100 mg Oral Daily  . atorvastatin  40 mg Oral q1800  . colchicine  0.6 mg Oral Daily  . folic acid  1 mg Oral Daily  . insulin aspart  0-15 Units Subcutaneous TID WC  . isosorbide mononitrate  60 mg Oral Daily  . levofloxacin (LEVAQUIN) IV  750 mg Intravenous Q24H  . metoprolol  50 mg Oral BID  . multivitamin with minerals  1 tablet Oral Daily  . oseltamivir  30 mg Oral BID  . pantoprazole  40 mg Oral Daily  . primidone  50 mg Oral Daily  . senna  1 tablet Oral BID  . sodium chloride  3 mL Intravenous Q12H  . thiamine  100 mg Oral Daily  . torsemide  20 mg Oral BID  . vancomycin  750 mg Intravenous Q12H   Continuous Infusions: . sodium chloride 10 mL/hr at 09/02/15 0708    Time spent on care of this patient: 97 min   [place], MD 09/03/2015, 11:17 AM  LOS: 2 days   [facility] Office  [phone] Pager - Text Page per [email or web address] If 7PM-7AM, please contact night-coverage [email or web address]

## 2015-09-04 DIAGNOSIS — E119 Type 2 diabetes mellitus without complications: Secondary | ICD-10-CM

## 2015-09-04 DIAGNOSIS — E669 Obesity, unspecified: Secondary | ICD-10-CM

## 2015-09-04 DIAGNOSIS — J189 Pneumonia, unspecified organism: Secondary | ICD-10-CM

## 2015-09-04 DIAGNOSIS — I5033 Acute on chronic diastolic (congestive) heart failure: Secondary | ICD-10-CM

## 2015-09-04 DIAGNOSIS — E109 Type 1 diabetes mellitus without complications: Secondary | ICD-10-CM

## 2015-09-04 LAB — GLUCOSE, CAPILLARY
GLUCOSE-CAPILLARY: 109 mg/dL — AB (ref 65–99)
GLUCOSE-CAPILLARY: 114 mg/dL — AB (ref 65–99)
Glucose-Capillary: 95 mg/dL (ref 65–99)

## 2015-09-04 LAB — OCCULT BLOOD X 1 CARD TO LAB, STOOL: Fecal Occult Bld: NEGATIVE

## 2015-09-04 MED ORDER — GUAIFENESIN ER 600 MG PO TB12
600.0000 mg | ORAL_TABLET | Freq: Two times a day (BID) | ORAL | Status: DC
  Administered 2015-09-04 – 2015-09-05 (×3): 600 mg via ORAL
  Filled 2015-09-04 (×4): qty 1

## 2015-09-04 MED ORDER — DOXYCYCLINE HYCLATE 100 MG PO TABS
100.0000 mg | ORAL_TABLET | Freq: Two times a day (BID) | ORAL | Status: DC
  Administered 2015-09-04 – 2015-09-05 (×3): 100 mg via ORAL
  Filled 2015-09-04 (×5): qty 1

## 2015-09-04 MED ORDER — BENZONATATE 100 MG PO CAPS
100.0000 mg | ORAL_CAPSULE | Freq: Three times a day (TID) | ORAL | Status: DC
Start: 2015-09-04 — End: 2015-09-05
  Administered 2015-09-04 – 2015-09-05 (×4): 100 mg via ORAL
  Filled 2015-09-04 (×6): qty 1

## 2015-09-04 NOTE — Progress Notes (Signed)
[facility] Progress Note   TYAH ACORD  QQI:[id number]  DOB: Apr 28, 1931  DOA: 09/01/2015 PCP: Abigail Miyamoto, MD  Brief narrative: [name] is a 81 y.o. female with a history of CVA, chronic systolic heart failure, on Oxygen at home, essential tremor, diabetes mellitus2, gout, morbid obesity who is wheelchair bound who presents to the hospital for anemia and generalized weakness. She was admitted at [facility] for vomiting and syncope from 11/26 through 11/29. During that admission she was noted to be dehydrated and diuretics were held. She was transfused 2 units of packed red blood cells on 11/27 for hemoglobin of 6.1 which had dropped from 8.3 in 24 hours. No etiology for this drop was found.   She was referred to oncology for further workup of the anemia and was evaluated on 12/8. Oncology recommended that she be admitted for transfusion of 2 units of packed red blood cells. A bone marrow biopsy was also recommended.On presentation to the ER her hemoglobin is 7.9 on 12/8.  Upon receiving the first unit of packed red blood cells, the patient became severely short of breath and was noted to have pulmonary edema. She was given IV Lasix with resultant improvement Upon my eval on 12/9, she admitted to cough with congestion for 2-3 days. She was also noted to be febrile with a rectal temperature of 100.8.  Subjective: Cough and breathing improving.   Assessment/Plan: Principal Problem:   Anemia-etiology undetermined - sent to ER by oncology for transfusion - given 1 U PRBC- Hb was 7.9 pre-transfusion- Hb improved to 9.3 - Oncology recommends a bone marrow biopsy which has been postponed until Monday by IR - stool hemoccult still pending  Active Problems: Fever/ cough / congestion>>> HCAP  - CXR reveals worsening LLL infiltrate- started Levaqun + Vanc - allergic to PCN - initially refused flu swab- started Tamiflu- Influenza panel later found to be  negative- stopped Tamiflu -  fevers resolving- cough improving but not resolved completely - can change Vanc and Levaquin to Doxy now  Pulmonary edema/ mild troponin elevation- CAD status post CABG 1 - last ECHO (2012) revealed EF of 40-45 %- Demedex held during recent admission due to dehydration and BUN in 70-80 range - daughter admits patient has gained weight while at home - repeat CXR 12/9 showed worseing pulm edema - repeate ECHO reveals EF of 50%, mild LVH, Gr 1 D dysf, Mod MR and focal hypokinesis - no chest pain  -Cut back on diuretics today as she has lost 4 kg and Cr has risen today  Chronic resp failure - uses O2 at home  Hyperkalemia - gave Lactulose x 2 days and despite having BMs, K still elevated at 5.6 (despite Lasix) -after numerous days of BMs and Lasix K has finally normalized today    Diabetes mellitus 2 - cont sliding scale     GERD (gastroesophageal reflux disease) - Protonix    Essential hypertension - Imdur, Metoprolol  H/o CVA and CABG - on Plavix at home- on hold at this time due to anemia- still awaiting stool hemoccult   Morbid obesity Body mass index is 34.93 kg/(m^2).  Non-ambulatory   Code Status:     Code Status Orders        Start     Ordered   09/01/15 1945  Full code   Continuous     09/01/15 1944    Advance Directive Documentation        Most Recent  Value   Type of Advance Directive  Healthcare Power of Attorney, Living will   Pre-existing out of facility DNR order (yellow form or pink MOST form)     "MOST" Form in Place?       Family Communication:  Daughter daily Disposition Plan: cont to follow DVT prophylaxis: SCDs Consultants:  Procedures:  Normal LV size with mild LV hypertrophy. EF 50%. Basal inferior and basal inferolateral hypokinesis. Septal bounce consistent with LBBB. RV poorly visualized but probably normal size/systolic function. Moderate MR. Antibiotics: Anti-infectives    Start     Dose/Rate  Route Frequency Ordered Stop   09/05/15 1000  levofloxacin (LEVAQUIN) IVPB 750 mg     750 mg 100 mL/hr over 90 Minutes Intravenous Every 48 hours 09/03/15 1326     09/02/15 2200  vancomycin (VANCOCIN) IVPB 750 mg/150 ml premix     750 mg 150 mL/hr over 60 Minutes Intravenous Every 12 hours 09/02/15 0926     09/02/15 2200  oseltamivir (TAMIFLU) capsule 30 mg  Status:  Discontinued     30 mg Oral 2 times daily 09/02/15 1350 09/03/15 1326   09/02/15 1000  oseltamivir (TAMIFLU) capsule 75 mg  Status:  Discontinued     75 mg Oral 2 times daily 09/02/15 0901 09/02/15 1350   09/02/15 1000  vancomycin (VANCOCIN) 1,500 mg in sodium chloride 0.9 % 500 mL IVPB     1,500 mg 250 mL/hr over 120 Minutes Intravenous  Once 09/02/15 0923 09/02/15 1148   09/02/15 1000  levofloxacin (LEVAQUIN) IVPB 750 mg  Status:  Discontinued     750 mg 100 mL/hr over 90 Minutes Intravenous Every 24 hours 09/02/15 0925 09/03/15 1326      Objective: Filed Weights   09/03/15 0628 09/03/15 0824 09/04/15 0615  Weight: 90.719 kg (200 lb) 88.724 kg (195 lb 9.6 oz) 86.637 kg (191 lb)    Intake/Output Summary (Last 24 hours) at 09/04/15 1048 Last data filed at 09/03/15 1827  Gross per 24 hour  Intake    470 ml  Output      0 ml  Net    470 ml     Vitals Filed Vitals:   09/03/15 2110 09/03/15 2136 09/04/15 0615 09/04/15 0626  BP: 168/64  117/65   Pulse: 92  92   Temp: 97.6 F (36.4 C)  97.9 F (36.6 C)   TempSrc: Oral  Oral   Resp: 22  20   Height:      Weight:   86.637 kg (191 lb)   SpO2: 94% 98% 91% 98%    Exam:  General:  Pt is alert, not in acute distress  HEENT: No icterus, No thrush, oral mucosa moist  Cardiovascular: regular rate and rhythm, S1/S2 No murmur  Respiratory: no rhonchi, no wheezing - no crackles- poor air entry  Abdomen: Soft, +Bowel sounds, non tender, non distended, no guarding  MSK: No LE edema, cyanosis or clubbing  Data Reviewed: Basic Metabolic Panel:  Recent Labs Lab  09/01/15 2009 09/02/15 0715 09/02/15 1537 09/03/15 1639  NA 140 139 140 139  K 5.8* 5.8* 5.6* 4.7  CL 107 104 106 104  CO2 _0 GLUCOSE 100* 122* 103* 113*  BUN 23* 23* 24* 23*  CREATININE 0.95 0.90 1.00 1.05*  CALCIUM 8.5* 8.7* 8.3* 8.5*   Liver Function Tests:  Recent Labs Lab 09/01/15 2009 09/02/15 0715  AST 44*  44* 54*  ALT 27  26 32  ALKPHOS 144*  147*  158*  BILITOT 0.8  0.6 1.5*  PROT 6.5  6.3* 6.8  ALBUMIN 3.0*  2.8* 3.2*   No results for input(s): LIPASE, AMYLASE in the last 168 hours. No results for input(s): AMMONIA in the last 168 hours. CBC:  Recent Labs Lab 09/01/15 1555 09/01/15 2009 09/02/15 0715 09/02/15 1537  WBC 5.9 6.2 14.7* 8.5  NEUTROABS 3.9 4.1  --   --   HGB 8.0* 7.9* 10.2* 9.3*  HCT 26.5* 26.6* 33.4* 30.6*  MCV 90.0 94.7 92.3 90.8  PLT 149 135* 153 133*   Cardiac Enzymes:  Recent Labs Lab 09/01/15 2009 09/02/15 0715 09/02/15 1537  TROPONINI 0.03 0.06* 0.08*   BNP (last 3 results)  Recent Labs  09/02/15 0715  BNP 998.9*    ProBNP (last 3 results) No results for input(s): PROBNP in the last 8760 hours.  CBG:  Recent Labs Lab 09/02/15 1710 09/03/15 0727 09/03/15 1202 09/03/15 1638 09/04/15 0755  GLUCAP 91 88 92 112* 109*    No results found for this or any previous visit (from the past 240 hour(s)).   Studies: No results found.  Scheduled Meds:  Scheduled Meds: . sodium chloride   Intravenous Once  . allopurinol  100 mg Oral Daily  . atorvastatin  40 mg Oral q1800  . benzonatate  100 mg Oral TID  . colchicine  0.6 mg Oral Daily  . folic acid  1 mg Oral Daily  . guaiFENesin  600 mg Oral BID  . insulin aspart  0-15 Units Subcutaneous TID WC  . isosorbide mononitrate  60 mg Oral Daily  . [START ON 09/05/2015] levofloxacin (LEVAQUIN) IV  750 mg Intravenous Q48H  . metoprolol  50 mg Oral BID  . multivitamin with minerals  1 tablet Oral Daily  . pantoprazole  40 mg Oral Daily  . primidone   50 mg Oral Daily  . senna  1 tablet Oral BID  . sodium chloride  3 mL Intravenous Q12H  . thiamine  100 mg Oral Daily  . vancomycin  750 mg Intravenous Q12H   Continuous Infusions: . sodium chloride 10 mL/hr at 09/02/15 0708    Time spent on care of this patient: 67 min   [place], MD 09/04/2015, 10:48 AM  LOS: 3 days   [facility] Office  [phone] Pager - Text Page per [email or web address] If 7PM-7AM, please contact night-coverage [email or web address]

## 2015-09-05 ENCOUNTER — Other Ambulatory Visit: Payer: Self-pay | Admitting: Oncology

## 2015-09-05 ENCOUNTER — Other Ambulatory Visit: Payer: Self-pay | Admitting: *Deleted

## 2015-09-05 DIAGNOSIS — D649 Anemia, unspecified: Secondary | ICD-10-CM

## 2015-09-05 DIAGNOSIS — I5033 Acute on chronic diastolic (congestive) heart failure: Secondary | ICD-10-CM

## 2015-09-05 DIAGNOSIS — E119 Type 2 diabetes mellitus without complications: Secondary | ICD-10-CM

## 2015-09-05 LAB — TYPE AND SCREEN
ABO/RH(D): O POS
Antibody Screen: NEGATIVE
UNIT DIVISION: 0
Unit division: 0

## 2015-09-05 LAB — GLUCOSE, CAPILLARY
Glucose-Capillary: 118 mg/dL — ABNORMAL HIGH (ref 65–99)
Glucose-Capillary: 109 mg/dL — ABNORMAL HIGH (ref 65–99)
Glucose-Capillary: 112 mg/dL — ABNORMAL HIGH (ref 65–99)

## 2015-09-05 LAB — FOLATE RBC
Folate, RBC: >1908 ng/mL (ref >498)
Hematocrit: 32.5 % — ABNORMAL LOW (ref 34.0–46.6)

## 2015-09-05 MED ORDER — TORSEMIDE 20 MG PO TABS: 20.0000 mg | ORAL_TABLET | Freq: Every day | ORAL | Status: DC

## 2015-09-05 NOTE — Progress Notes (Signed)
Patient discharged home with daughter, discharge instructions given and explained to patient/daughter and they verbalized understanding, reinforced outpatient biopsy appointment and instruction sheet from radiology given to patient/family. Patient denies any pain/distress. No wound noted, skin intact. Accompanied home daughter via ambulance-[facility].

## 2015-09-05 NOTE — Care Management Important Message (Signed)
Important Message  Patient Details  Name: [name] MRN: [id number] Date of Birth: 06/25/1931   Medicare Important Message Given:  Yes    Haskell FlirtJamison, Kenzie Flakes 09/05/2015, 1:14 PMImportant Message  Patient Details  Name: [name] MRN: [id number] Date of Birth: 06/25/1931   Medicare Important Message Given:  Yes    Haskell FlirtJamison, Enzio Buchler 09/05/2015, 1:14 PM

## 2015-09-05 NOTE — Discharge Summary (Addendum)
Physician Discharge Summary  [name] GBM:[id number] DOB: [birth year] DOA: 09/01/2015  PCP: Abigail Miyamoto, MD  Admit date: 09/01/2015 Discharge date: 09/05/2015  Time spent: 60 minutes  Recommendations for Outpatient Follow-up:  1. Bmet in 1-2 wks- resume Losartan if Cr is not rising- adjust dose of Demadex as needed   Discharge Condition: stable    Discharge Diagnoses:  Principal Problem:   Symptomatic anemia Active Problems:   GERD (gastroesophageal reflux disease)   Hyperlipidemia   Essential hypertension   DM type 2 goal A1C below 7.5   HCAP (healthcare-associated pneumonia)   Diastolic CHF, acute on chronic (HCC)   Morbid obesity ([place])   History of present illness:  [name] is a 81 y.o. female with a history of CVA, chronic systolic heart failure, on Oxygen at home, essential tremor, diabetes mellitus2, gout, morbid obesity who is wheelchair bound who presents to the hospital for anemia and generalized weakness. She was admitted at [facility] for vomiting and syncope from 11/26 through 11/29. During that admission she was noted to be dehydrated and diuretics were held. She was transfused 2 units of packed red blood cells on 11/27 for hemoglobin of 6.1 which had dropped from 8.3 in 24 hours. No etiology for this drop was found.   She was referred to oncology for further workup of the anemia and was evaluated on 12/8. Oncology recommended that she be admitted for transfusion of 2 units of packed red blood cells. A bone marrow biopsy was also recommended.On presentation to the ER her hemoglobin is 7.9 on 12/8. Upon receiving the first unit of packed red blood cells, the patient became severely short of breath and was noted to have pulmonary edema. She was given IV Lasix with resultant improvement Upon my eval on 12/9, she admitted to cough with congestion for 2-3 days. She was also noted to be febrile with a rectal temperature of  100.8.   Hospital Course:  Principal Problem:  Anemia-etiology undetermined - sent to ER by oncology for transfusion - given 1 U PRBC- Hb was 7.9 pre-transfusion- now 9.3 - Oncology recommends a bone marrow biopsy which was postponed until Monday by IR- today, it was decided that the patient needs to have the procedure performed with mild sedation and as she was not NPO, biopsy cannot be performed today - will have biopsy arranged as outpt as per social work, she no longer meets criteria for hospital stay - FOB x 1 negative on 12/11  Active Problems: Fever/ cough / congestion>>> HCAP  - CXR reveals worsening LLL infiltrate- started Levaqun + Vanc - allergic to PCN - refused flu swab- started Tamiflu- eventually agreed to flu swab which was negative and therefore Tamiflu stopped  - cough improving  - Transitioned to Doxycycline-  will complete a 7 day course of antibiotics  Pulmonary edema/ mild troponin elevation- CAD status post CABG 1 - last ECHO (2012) revealed EF of 40-45 %- Demedex held during recent admission due to dehydration and BUN in 70-80 range - daughter admits patient has gained weight - repeat CXR 12/9 showed worseing pulm edema - repeate ECHO reveals EF of 50%, mild LVH, Gr 1 D dysf, Mod MR and focal hypokinesis - no chest pain   - has diuresed well- weight 90 kg >> 88 kg -  have resume Demadex for outpt use - - f/u Cr as outpt  Chronic resp failure - uses O2 at home  Hyperkalemia - resolved after receiving Lactulose  and holding ACE I for multiple days - will cont to hold ACE I for now-    Diabetes mellitus 2 - cont sliding scale    GERD (gastroesophageal reflux disease) - Protonix   Essential hypertension - Imdur, Metoprolol  H/o CVA and CABG - on Plavix at home  Morbid obesity Body mass index is 35.69 kg/(m^2).   Wheelchair bound     Procedures: ECHO: Normal LV size with mild LV hypertrophy. EF 50%. Basal inferior and basal  inferolateral hypokinesis. Septal bounce consistent with LBBB. RV poorly visualized but probably normal size/systolic function. Moderate MR.  Consultations:  IR  Discharge Exam: Filed Weights   09/03/15 0824 09/04/15 0615 09/05/15 0500  Weight: 88.724 kg (195 lb 9.6 oz) 86.637 kg (191 lb) 88.542 kg (195 lb 3.2 oz)   Filed Vitals:   09/05/15 0500 09/05/15 0942  BP: 128/37 151/70  Pulse: 78 94  Temp: 98.2 F (36.8 C)   Resp: 20     General: AAO x 3, no distress Cardiovascular: RRR, no murmurs  Respiratory: clear to auscultation bilaterally- on 2 L O2- pulse ox 98% GI: soft, non-tender, non-distended, bowel sound positive  Discharge Instructions You were cared for by a hospitalist during your hospital stay. If you have any questions about your discharge medications or the care you received while you were in the hospital after you are discharged, you can call the unit and asked to speak with the hospitalist on call if the hospitalist that took care of you is not available. Once you are discharged, your primary care physician will handle any further medical issues. Please note that NO REFILLS for any discharge medications will be authorized once you are discharged, as it is imperative that you return to your primary care physician (or establish a relationship with a primary care physician if you do not have one) for your aftercare needs so that they can reassess your need for medications and monitor your lab values.      Discharge Instructions    (HEART FAILURE PATIENTS) Call MD:  Anytime you have any of the following symptoms: 1) 3 pound weight gain in 24 hours or 5 pounds in 1 week 2) shortness of breath, with or without a dry hacking cough 3) swelling in the hands, feet or stomach 4) if you have to sleep on extra pillows at night in order to breathe.    Complete by:  As directed      Diet - low sodium heart healthy    Complete by:  As directed      Discharge instructions     Complete by:  As directed   You need to see your family doctor in 1 wk to have Bmet and CBC done     Increase activity slowly    Complete by:  As directed             Medication List    STOP taking these medications        lisinopril 2.5 MG tablet  Commonly known as:  PRINIVIL,ZESTRIL      TAKE these medications        acetaminophen 500 MG tablet  Commonly known as:  TYLENOL  Take 1,000 mg by mouth every 6 (six) hours as needed. pain     allopurinol 100 MG tablet  Commonly known as:  ZYLOPRIM  Take 100 mg by mouth daily.     atorvastatin 40 MG tablet  Commonly known as:  LIPITOR  Take 1 tablet (  40 mg total) by mouth daily.     AZO CRANBERRY PO  Take 1 capsule by mouth 2 (two) times daily.     clopidogrel 75 MG tablet  Commonly known as:  PLAVIX  Take 1 tablet (75 mg total) by mouth daily.     colchicine 0.6 MG tablet  Take 0.6 mg by mouth daily.     folic acid 1 MG tablet  Commonly known as:  FOLVITE  Take 1 tablet (1 mg total) by mouth daily.     isosorbide mononitrate 60 MG 24 hr tablet  Commonly known as:  IMDUR  Take 1 tablet (60 mg total) by mouth daily. NEED OV.     JANUVIA 50 MG tablet  Generic drug:  sitaGLIPtin  Take 50 mg by mouth daily with breakfast.     metoprolol 50 MG tablet  Commonly known as:  LOPRESSOR  Take 1 tablet (50 mg total) by mouth 2 (two) times daily.     nitroGLYCERIN 0.4 MG SL tablet  Commonly known as:  NITROSTAT  Place 1 tablet (0.4 mg total) under the tongue as needed.     omega-3 acid ethyl esters 1 G capsule  Commonly known as:  LOVAZA  TAKE TWO CAPSULES BY MOUTH TWICE DAILY     pantoprazole 40 MG tablet  Commonly known as:  PROTONIX  Take 1 tablet (40 mg total) by mouth daily. Make appt for refills.     polysaccharide iron 150 MG capsule  Generic drug:  iron polysaccharides  Take 150 mg by mouth daily.     primidone 50 MG tablet  Commonly known as:  MYSOLINE  Take 50 mg by mouth at bedtime.     senna 8.6  MG tablet  Commonly known as:  SENOKOT  Take 2 tablets by mouth at bedtime.     torsemide 20 MG tablet  Commonly known as:  DEMADEX  Take 1 tablet (20 mg total) by mouth daily.       Allergies  Allergen Reactions  . Penicillins Anaphylaxis, Itching and Swelling    Has patient had a PCN reaction causing immediate rash, facial/tongue/throat swelling, SOB or lightheadedness with hypotension: Yes Has patient had a PCN reaction causing severe rash involving mucus membranes or skin necrosis: No  Has patient had a PCN reaction that required hospitalization: Unknown Has patient had a PCN reaction occurring within the last 10 years: No  If all of the above answers are "NO", then may proceed with Cephalosporin use.       The results of significant diagnostics from this hospitalization (including imaging, microbiology, ancillary and laboratory) are listed below for reference.    Significant Diagnostic Studies: Dg Chest Port 1 View  09/02/2015  CLINICAL DATA:  81 year old female with fever and hypoxia today. EXAM: PORTABLE CHEST 1 VIEW COMPARISON:  Chest x-ray 09/01/2015. FINDINGS: Lung volumes are slightly low. Opacity at the left base may reflect atelectasis and/or consolidation. Small left pleural effusion. Diffuse interstitial prominence, increased compared to the prior study. Cephalization of the pulmonary vasculature. Moderate cardiomegaly. Extensive atherosclerosis in the thoracic aorta. Status post median sternotomy for CABG, including LIMA. IMPRESSION: 1. The appearance of the chest suggests worsening congestive heart failure, as above. 2. Atelectasis and/or consolidation in the left lower lobe with small left-sided pleural effusion. 3. Extensive atherosclerosis. Electronically Signed   By: Vinnie Langton M.D.   On: 09/02/2015 09:41   Dg Chest Port 1 View  09/01/2015  CLINICAL DATA:  Shortness of breath. History of  hypertension, diabetes and congestive heart failure. EXAM: PORTABLE CHEST  1 VIEW COMPARISON:  10/18/2011 and 08/20/2015. FINDINGS: 1931 hours. Stable cardiomegaly and aortic atherosclerosis status post median sternotomy and CABG. There is chronic vascular congestion without overt pulmonary edema. There is increased density at the left lung base which is probably due to a combination of atelectasis and pleural thickening. There may be a small amount of pleural fluid. No acute osseous findings seen. IMPRESSION: Chronic cardiomegaly and chronic vascular congestion. New patchy left basilar opacity, probably atelectasis. Early aspiration would be a consideration in the appropriate clinical context. Electronically Signed   By: Richardean Sale M.D.   On: 09/01/2015 19:39    Microbiology: No results found for this or any previous visit (from the past 240 hour(s)).   Labs: Basic Metabolic Panel:  Recent Labs Lab 09/01/15 2009 09/02/15 0715 09/02/15 1537 09/03/15 1639  NA 140 139 140 139  K 5.8* 5.8* 5.6* 4.7  CL 107 104 106 104  CO2 $Re'29 28 27 29  'hTy$ GLUCOSE 100* 122* 103* 113*  BUN 23* 23* 24* 23*  CREATININE 0.95 0.90 1.00 1.05*  CALCIUM 8.5* 8.7* 8.3* 8.5*   Liver Function Tests:  Recent Labs Lab 09/01/15 2009 09/02/15 0715  AST 44*  44* 54*  ALT 27  26 32  ALKPHOS 144*  147* 158*  BILITOT 0.8  0.6 1.5*  PROT 6.5  6.3* 6.8  ALBUMIN 3.0*  2.8* 3.2*   No results for input(s): LIPASE, AMYLASE in the last 168 hours. No results for input(s): AMMONIA in the last 168 hours. CBC:  Recent Labs Lab 09/01/15 1555 09/01/15 2009 09/02/15 0715 09/02/15 1537  WBC 5.9 6.2 14.7* 8.5  NEUTROABS 3.9 4.1  --   --   HGB 8.0* 7.9* 10.2* 9.3*  HCT 26.5* 26.6* 33.4* 30.6*  MCV 90.0 94.7 92.3 90.8  PLT 149 135* 153 133*   Cardiac Enzymes:  Recent Labs Lab 09/01/15 2009 09/02/15 0715 09/02/15 1537  TROPONINI 0.03 0.06* 0.08*   BNP: BNP (last 3 results)  Recent Labs  09/02/15 0715  BNP 998.9*    ProBNP (last 3 results) No results for input(s):  PROBNP in the last 8760 hours.  CBG:  Recent Labs Lab 09/04/15 1356 09/04/15 1626 09/04/15 2017 09/05/15 0727 09/05/15 1200  GLUCAP 95 114* 118* 109* 112*       SignedDebbe Odea, MD [facility] 09/05/2015, 1:04 PM

## 2015-09-06 ENCOUNTER — Ambulatory Visit: Payer: Medicare Other | Admitting: Oncology

## 2015-09-06 ENCOUNTER — Telehealth: Payer: Self-pay | Admitting: Cardiovascular Disease

## 2015-09-06 ENCOUNTER — Other Ambulatory Visit: Payer: Medicare Other

## 2015-09-06 NOTE — Telephone Encounter (Signed)
Returned call to patient's daughter Angelyn PuntRaja.She stated she would like to schedule mother [facility] for post hospital follow up.Advised Dr.Kelly's schedule is full.Advised to keep appointment scheduled with Wilburt FinlayBryan Hager PA 09/12/15 at 3:30 pm [facility].

## 2015-09-06 NOTE — Telephone Encounter (Signed)
Pt's daughter called in stating that when the pt was admitted in to the hospital she stopped taking her Lisinopril and the daughter wanted to know if she could continue to take now. Please f/u with her  Thanks

## 2015-09-07 ENCOUNTER — Other Ambulatory Visit: Payer: Self-pay | Admitting: Radiology

## 2015-09-09 ENCOUNTER — Encounter (HOSPITAL_COMMUNITY): Payer: Self-pay

## 2015-09-09 ENCOUNTER — Telehealth: Payer: Self-pay | Admitting: Family Medicine

## 2015-09-09 ENCOUNTER — Ambulatory Visit (HOSPITAL_COMMUNITY)
Admission: AD | Admit: 2015-09-09 | Discharge: 2015-09-09 | Disposition: A | Discharge status: Home / Self Care | Payer: Medicare Other | Source: Ambulatory Visit | Attending: Oncology | Admitting: Oncology

## 2015-09-09 DIAGNOSIS — D649 Anemia, unspecified: Secondary | ICD-10-CM

## 2015-09-09 LAB — CBC
HCT: 29.4 % — ABNORMAL LOW (ref 36.0–46.0)
HEMOGLOBIN: 9.3 g/dL — AB (ref 12.0–15.0)
MCH: 28.6 pg (ref 26.0–34.0)
MCHC: 31.6 g/dL (ref 30.0–36.0)
MCV: 90.5 fL (ref 78.0–100.0)
Platelets: 117 10*3/uL — ABNORMAL LOW (ref 150–400)
RBC: 3.25 MIL/uL — AB (ref 3.87–5.11)
RDW: 17.8 % — ABNORMAL HIGH (ref 11.5–15.5)
WBC: 7.3 10*3/uL (ref 4.0–10.5)

## 2015-09-09 LAB — APTT: aPTT: 39 seconds — ABNORMAL HIGH (ref 24–37)

## 2015-09-09 LAB — BONE MARROW EXAM

## 2015-09-09 LAB — PROTIME-INR
INR: 0.99 (ref 0.00–1.49)
PROTHROMBIN TIME: 13.3 s (ref 11.6–15.2)

## 2015-09-09 LAB — GLUCOSE, CAPILLARY: GLUCOSE-CAPILLARY: 101 mg/dL — AB (ref 65–99)

## 2015-09-09 MED ORDER — MIDAZOLAM HCL 2 MG/2ML IJ SOLN
INTRAMUSCULAR | Status: AC | PRN
  Administered 2015-09-09 (×2): 1 mg via INTRAVENOUS

## 2015-09-09 MED ORDER — SODIUM CHLORIDE 0.9 % IV SOLN
Freq: Once | INTRAVENOUS | Status: AC
  Administered 2015-09-09: 09:00:00 via INTRAVENOUS

## 2015-09-09 MED ORDER — FENTANYL CITRATE (PF) 100 MCG/2ML IJ SOLN
INTRAMUSCULAR | Status: AC
  Filled 2015-09-09: qty 4

## 2015-09-09 MED ORDER — FENTANYL CITRATE (PF) 100 MCG/2ML IJ SOLN
INTRAMUSCULAR | Status: AC | PRN
  Administered 2015-09-09: 25 ug via INTRAVENOUS
  Administered 2015-09-09: 50 ug via INTRAVENOUS

## 2015-09-09 MED ORDER — MIDAZOLAM HCL 2 MG/2ML IJ SOLN
INTRAMUSCULAR | Status: AC
  Filled 2015-09-09: qty 6

## 2015-09-09 NOTE — H&P (Signed)
HPI: Patient with persistent anemia, unknown etiology. She denies any active bleeding and hemoccult negative. She has been seen by Dr. Benay Spice on 09/01/2015 and scheduled today for image guided bone marrow biopsy.  The patient has had a H&P performed within the last 30 days, all history, medications, and exam have been reviewed. The patient denies any interval changes since the H&P.  Medications: Prior to Admission medications   Medication Sig Start Date End Date Taking? Authorizing Provider  acetaminophen (TYLENOL) 500 MG tablet Take 1,000 mg by mouth every 6 (six) hours as needed. pain    Yes Historical Provider, MD  allopurinol (ZYLOPRIM) 100 MG tablet Take 100 mg by mouth daily. 03/08/14  Yes Historical Provider, MD  atorvastatin (LIPITOR) 40 MG tablet Take 1 tablet (40 mg total) by mouth daily. 08/16/15  Yes Troy Sine, MD  colchicine 0.6 MG tablet Take 0.6 mg by mouth daily.    Yes Historical Provider, MD  Cranberry-Vitamin C-Probiotic (AZO CRANBERRY PO) Take 1 capsule by mouth 2 (two) times daily.   Yes Historical Provider, MD  folic acid (FOLVITE) 1 MG tablet Take 1 tablet (1 mg total) by mouth daily. 08/12/15  Yes Troy Sine, MD  isosorbide mononitrate (IMDUR) 60 MG 24 hr tablet Take 1 tablet (60 mg total) by mouth daily. NEED OV. 08/16/15  Yes Troy Sine, MD  JANUVIA 50 MG tablet Take 50 mg by mouth daily with breakfast.  04/09/14  Yes Historical Provider, MD  metoprolol (LOPRESSOR) 50 MG tablet Take 1 tablet (50 mg total) by mouth 2 (two) times daily. 08/16/15  Yes Troy Sine, MD  omega-3 acid ethyl esters (LOVAZA) 1 G capsule TAKE TWO CAPSULES BY MOUTH TWICE DAILY 08/16/15  Yes Troy Sine, MD  pantoprazole (PROTONIX) 40 MG tablet Take 1 tablet (40 mg total) by mouth daily. Make appt for refills. 08/16/15  Yes Troy Sine, MD  polysaccharide iron (NIFEREX) 150 MG CAPS capsule Take 150 mg by mouth daily.    Yes Historical Provider, MD  primidone (MYSOLINE) 50 MG  tablet Take 50 mg by mouth at bedtime.    Yes Historical Provider, MD  senna (SENOKOT) 8.6 MG tablet Take 2 tablets by mouth at bedtime.    Yes Historical Provider, MD  torsemide (DEMADEX) 20 MG tablet Take 1 tablet (20 mg total) by mouth daily. 09/05/15  Yes Debbe Odea, MD  clopidogrel (PLAVIX) 75 MG tablet Take 1 tablet (75 mg total) by mouth daily. 08/16/15   Troy Sine, MD  nitroGLYCERIN (NITROSTAT) 0.4 MG SL tablet Place 1 tablet (0.4 mg total) under the tongue as needed. 08/16/15   Troy Sine, MD     Vital Signs: BP 137/60 mmHg  Pulse 82  Temp(Src) 97.5 F (36.4 C) (Oral)  Resp 28  Ht '5\' 2"'$  (1.575 m)  Wt 195 lb (88.451 kg)  BMI 35.66 kg/m2  SpO2 100% 3L  Physical Exam  Constitutional: She is oriented to person, place, and time. No distress.  HENT:  Head: Normocephalic and atraumatic.  Cardiovascular: Normal rate and regular rhythm.  Exam reveals no gallop and no friction rub.   No murmur heard. Pulmonary/Chest: Effort normal. No respiratory distress. She has wheezes. She has no rales.  Abdominal: Soft. Bowel sounds are normal. She exhibits no distension. There is no tenderness.  Neurological: She is alert and oriented to person, place, and time.  Skin: Skin is warm and dry. She is not diaphoretic.    Mallampati Score:  MD Evaluation Airway: WNL Heart: WNL Abdomen: WNL Chest/ Lungs: WNL ASA  Classification: 2 Mallampati/Airway Score: Two  Labs:  CBC:  Recent Labs  09/01/15 1555 09/01/15 2009 09/02/15 0715 09/02/15 1537  WBC 5.9 6.2 14.7* 8.5  HGB 8.0* 7.9* 10.2* 9.3*  HCT 26.5* 26.6* 33.4*  32.5* 30.6*  PLT 149 135* 153 133*    COAGS:  Recent Labs  09/01/15 2009 09/02/15 0715  INR 1.07 1.07    BMP:  Recent Labs  09/01/15 2009 09/02/15 0715 09/02/15 1537 09/03/15 1639  NA 140 139 140 139  K 5.8* 5.8* 5.6* 4.7  CL 107 104 106 104  CO2 '29 28 27 29  '$ GLUCOSE 100* 122* 103* 113*  BUN 23* 23* 24* 23*  CALCIUM 8.5* 8.7* 8.3* 8.5*    CREATININE 0.95 0.90 1.00 1.05*  GFRNONAA 55* 58* 51* 48*  GFRAA >60 >60 60* 56*    LIVER FUNCTION TESTS:  Recent Labs  09/01/15 2009 09/02/15 0715  BILITOT 0.8  0.6 1.5*  AST 44*  44* 54*  ALT 27  26 32  ALKPHOS 144*  147* 158*  PROT 6.5  6.3* 6.8  ALBUMIN 3.0*  2.8* 3.2*    Assessment/Plan:  Anemia, unknown etiology  Seen by Dr. Benay Spice on 09/01/2015 Scheduled today for image guided bone marrow biopsy with sedation The patient has been NPO, no blood thinners taken, labs and vitals have been reviewed. Risks and Benefits discussed with the patient including, but not limited to bleeding, infection, damage to adjacent structures or low yield requiring additional tests. All of the patient's questions were answered, patient is agreeable to proceed. Consent signed and in chart. History of CVA CHF on chronic 3L oxygen    Signed: Hedy Jacob 09/09/2015, 9:35 AM

## 2015-09-09 NOTE — Telephone Encounter (Signed)
Scheduled new pt appt   ----- Message -----   From: Monique HatchKatherine E Tabori, MD   Sent: 09/06/2015 12:47 PM    To: [name]  Subject: RE: New Patient Request              Ok to schedule   ----- Message -----   From: Metro KungShiquita C [name]   Sent: 09/06/2015 12:10 PM    To: Monique HatchKatherine E Tabori, MD  Subject: New Patient Request                Caller name: [name]   Relationship to patient: daughter   Can be reached: [phone]   Reason for call: She says that he father is a current pt Monique Maples([name]) she says that her mother PCP retired. She would like to know if you would take her on as a new pt. Her name is [name].   I informed her that you aren't currently taking on new patients until you are at your new location. They expressed understanding. Please advise further for scheduling.    Thanks.

## 2015-09-09 NOTE — Discharge Instructions (Signed)
Bone Marrow Aspiration and Bone Marrow Biopsy, Care After °Refer to this sheet in the next few weeks. These instructions provide you with information about caring for yourself after your procedure. Your health care provider may also give you more specific instructions. Your treatment has been planned according to current medical practices, but problems sometimes occur. Call your health care provider if you have any problems or questions after your procedure. °WHAT TO EXPECT AFTER THE PROCEDURE °After your procedure, it is common to have: °· Soreness or tenderness around the puncture site. °· Bruising. °HOME CARE INSTRUCTIONS °· Take medicines only as directed by your health care provider. °· Follow your health care provider's instructions about: °· Puncture site care. °· Bandage (dressing) changes and removal. °· Bathe and shower as directed by your health care provider. °· Check your puncture site every day for signs of infection. Watch for: °· Redness, swelling, or pain. °· Fluid, blood, or pus. °· Return to your normal activities as directed by your health care provider. °· Keep all follow-up visits as directed by your health care provider. This is important. °SEEK MEDICAL CARE IF: °· You have a fever. °· You have uncontrollable bleeding. °· You have redness, swelling, or pain at the site of your puncture. °· You have fluid, blood, or pus coming from your puncture site. °  °This information is not intended to replace advice given to you by your health care provider. Make sure you discuss any questions you have with your health care provider. °  °Document Released: 03/30/2005 Document Revised: 01/25/2015 Document Reviewed: 09/01/2014 °Elsevier Interactive Patient Education ©2016 Elsevier Inc[employer] ° ° °Moderate Conscious Sedation, Adult °Sedation is the use of medicines to promote relaxation and relieve discomfort and anxiety. Moderate conscious sedation is a type of sedation. Under moderate conscious sedation you are  less alert than normal but are still able to respond to instructions or stimulation. Moderate conscious sedation is used during short medical and dental procedures. It is milder than deep sedation or general anesthesia and allows you to return to your regular activities sooner. °LET YOUR HEALTH CARE PROVIDER KNOW ABOUT:  °· Any allergies you have. °· All medicines you are taking, including vitamins, herbs, eye drops, creams, and over-the-counter medicines. °· Use of steroids (by mouth or creams). °· Previous problems you or members of your family have had with the use of anesthetics. °· Any blood disorders you have. °· Previous surgeries you have had. °· Medical conditions you have. °· Possibility of pregnancy, if this applies. °· Use of cigarettes, alcohol, or illegal drugs. °RISKS AND COMPLICATIONS °Generally, this is a safe procedure. However, as with any procedure, problems can occur. Possible problems include: °· Oversedation. °· Trouble breathing on your own. You may need to have a breathing tube until you are awake and breathing on your own. °· Allergic reaction to any of the medicines used for the procedure. °BEFORE THE PROCEDURE °· You may have blood tests done. These tests can help show how well your kidneys and liver are working. They can also show how well your blood clots. °· A physical exam will be done.   °· Only take medicines as directed by your health care provider. You may need to stop taking medicines (such as blood thinners, aspirin, or nonsteroidal anti-inflammatory drugs) before the procedure.   °· Do not eat or drink at least 6 hours before the procedure or as directed by your health care provider. °· Arrange for a responsible adult, family member, or friend to take   you home after the procedure. He or she should stay with you for at least 24 hours after the procedure, until the medicine has worn off. °PROCEDURE  °· An intravenous (IV) catheter will be inserted into one of your veins. Medicine  will be able to flow directly into your body through this catheter. You may be given medicine through this tube to help prevent pain and help you relax. °· The medical or dental procedure will be done. °AFTER THE PROCEDURE °· You will stay in a recovery area until the medicine has worn off. Your blood pressure and pulse will be checked.   °·  Depending on the procedure you had, you may be allowed to go home when you can tolerate liquids and your pain is under control. °  °This information is not intended to replace advice given to you by your health care provider. Make sure you discuss any questions you have with your health care provider. °  °Document Released: 06/05/2001 Document Revised: 10/01/2014 Document Reviewed: 05/18/2013 °Elsevier Interactive Patient Education ©2016 Elsevier Inc. ° ° °

## 2015-09-09 NOTE — Procedures (Signed)
Interventional Radiology Procedure Note  Procedure: CT guided aspirate and core biopsy of left iliac bone Complications: None Recommendations: - Bedrest supine x 1 hrs - OTC for post op soreness - Follow biopsy results  Signed,  Yvone NeuJaime S. Loreta AveWagner, DO

## 2015-09-12 ENCOUNTER — Encounter: Payer: Self-pay | Admitting: Physician Assistant

## 2015-09-12 ENCOUNTER — Ambulatory Visit (INDEPENDENT_AMBULATORY_CARE_PROVIDER_SITE_OTHER): Payer: Medicare Other | Admitting: Physician Assistant

## 2015-09-12 VITALS — BP 112/76 | HR 64 | Ht 60.0 in | Wt 197.0 lb

## 2015-09-12 DIAGNOSIS — E785 Hyperlipidemia, unspecified: Secondary | ICD-10-CM | POA: Diagnosis not present

## 2015-09-12 DIAGNOSIS — Z79899 Other long term (current) drug therapy: Secondary | ICD-10-CM

## 2015-09-12 DIAGNOSIS — I2510 Atherosclerotic heart disease of native coronary artery without angina pectoris: Secondary | ICD-10-CM | POA: Diagnosis not present

## 2015-09-12 DIAGNOSIS — I5032 Chronic diastolic (congestive) heart failure: Secondary | ICD-10-CM

## 2015-09-12 DIAGNOSIS — I10 Essential (primary) hypertension: Secondary | ICD-10-CM

## 2015-09-12 DIAGNOSIS — I2583 Coronary atherosclerosis due to lipid rich plaque: Secondary | ICD-10-CM

## 2015-09-12 MED ORDER — CLOPIDOGREL BISULFATE 75 MG PO TABS: 75.0000 mg | ORAL_TABLET | Freq: Every day | ORAL | Status: DC

## 2015-09-12 NOTE — Addendum Note (Signed)
Addended by: Ronnell GuadalajaraLASSITER, Serenitee Fuertes A on: 09/12/2015 05:06 PM   Modules accepted: Orders

## 2015-09-12 NOTE — Patient Instructions (Signed)
Your physician has recommended you make the following change in your medication: RESTART PLAVIX  Your physician recommends that you return for lab work in: Friday  AN ORDER HAS BEEN PLACED FOR A HOME HEALTH EVALUATION  Your physician recommends that you schedule a follow-up appointment in: 3 MONTHS WITH DR. Tresa EndoKELLY

## 2015-09-12 NOTE — Progress Notes (Signed)
Patient ID: [name], female   DOB: 04-29-1931, 81 y.o.   MRN: [id number]    Date:  09/12/2015   ID:  [name], DOB 04-29-1931, MRN [id number]  PCP:  Monique BoysFRIED, ROBERT L, MD   Primary Cardiologist:  Tresa EndoKelly   Chief Complaint  Patient presents with  . Hospitalization Follow-up    no chest pain, no shortness of breath, has edema, has pain in feet, no lightheaded ir dizziness     History of Present Illness: [name] is a 81 y.o. female   [name] has known CAD and underwent CABG revascularization surgery in 2000. In January 2006 she was found to have subtotal occlusion of the vein graft to RCA and underwent stenting with resumption of normal flow. She has known distal occlusion of her PDA/PLA-like vessel with collaterals from left to right she has an atretic LIMA but patent vein graft to diagonal which fills the LAD. She has had issues with some intermittent episodes of chest pain for which she has been on nitrate therapy, . In November 2013 A nuclear study suggested a small reversible defect involving the lateral inferior wall. Ejection fraction was 65%. She does have issues with some lower extremity edema.  When  Dr. Tresa EndoKelly saw her in July 2015 she was to undergo lower extremity Doppler studies. Her ABIs were felt to be falsely elevated due to medial calcification. Waveforms suggest tibial disease bilaterally. She had spontaneous venous flow noted throughout each level. She has abnormal great toe brachial index bilaterally, but it was felt to be adequate for tissue healing.    The patient was admitted from December 8 to 09/05/2015 for symptomatic anemia, for which she receives 1 unit PRBCs,  She developed pulmonary edema was given IV diuretic's and  diuresed to 88 kg.   She was fecal occult blood negative.   She was also treated for healthcare associated pneumonia diabetes management hypertension.  She had a 2-D echocardiogram which revealed an ejection fraction of 50%,  basal inferior and basal inferior lateral hypokinesis. Grade 1 diastolic dysfunction. Moderate MR.  Left atrium moderately dilated. There unable to estimate PA pressureShe is here for posthospital follow-up with her daughter.   She was discharged on Plavix , which she's been taking for quite a few years , however, the patient's daughter thought she was supposed to stop it.  She did undergo CT-guided aspirate and core biopsy of the left iliac bone on December 16. At that time CBC was stable.   The patient's daughter reports she is doing much better. She is at least able to stand somewhat.   She is on chronic oxygen.   .The patient currently denies nausea, vomiting, fever, chest pain, shortness of breath  Beyond baseline, orthopnea, dizziness, PND, cough, congestion, abdominal pain, hematochezia, melena, lower extremity edema,   Wt Readings from Last 3 Encounters:  09/12/15 197 lb (89.359 kg)  09/09/15 195 lb (88.451 kg)  09/05/15 195 lb 3.2 oz (88.542 kg)     Past Medical History  Diagnosis Date  . Diabetes mellitus   . Hypertension   . Stroke [facility](HCC) Right Side Weakness  . CHF (congestive heart failure) (HCC)     2D ECHO, 09/13/2011 - EF 40-45%, normal  . 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 Pain in limb     BILATERAL EXTREMITY VENOUS  DUPLEX, 01/08/2011 - no evidence of deep vein or superficial thrombosis or Baker's cyst    Current Outpatient Prescriptions  Medication Sig Dispense Refill  . acetaminophen (TYLENOL) 500 MG tablet Take 1,000 mg by mouth every 6 (six) hours as needed. pain     . allopurinol (ZYLOPRIM) 100 MG tablet Take 100 mg by mouth daily.    . colchicine 0.6 MG tablet Take 0.6 mg by mouth daily.     . folic acid (FOLVITE) 1 MG tablet Take 1 tablet (1 mg total) by mouth daily. 30 tablet 0  . isosorbide mononitrate (IMDUR)  60 MG 24 hr tablet Take 1 tablet (60 mg total) by mouth daily. NEED OV. 30 tablet 2  . JANUVIA 50 MG tablet Take 50 mg by mouth daily with breakfast.     . lisinopril (PRINIVIL,ZESTRIL) 2.5 MG tablet Take 2.5 mg by mouth daily.    . metoprolol (LOPRESSOR) 50 MG tablet Take 1 tablet (50 mg total) by mouth 2 (two) times daily. 30 tablet 6  . nitroGLYCERIN (NITROSTAT) 0.4 MG SL tablet Place 1 tablet (0.4 mg total) under the tongue as needed. 25 tablet 4  . omega-3 acid ethyl esters (LOVAZA) 1 G capsule TAKE TWO CAPSULES BY MOUTH TWICE DAILY 120 capsule 10  . pantoprazole (PROTONIX) 40 MG tablet Take 1 tablet (40 mg total) by mouth daily. Make appt for refills. 30 tablet 6  . polysaccharide iron (NIFEREX) 150 MG CAPS capsule Take 150 mg by mouth daily.     . primidone (MYSOLINE) 50 MG tablet Take 50 mg by mouth at bedtime.     . senna (SENOKOT) 8.6 MG tablet Take 2 tablets by mouth at bedtime.     . torsemide (DEMADEX) 20 MG tablet Take 1 tablet (20 mg total) by mouth daily. 60 tablet 6  . [DISCONTINUED] pregabalin (LYRICA) 75 MG capsule Take 75 mg by mouth daily.       No current facility-administered medications for this visit.    Allergies:    Allergies  Allergen Reactions  . Penicillins Anaphylaxis, Itching and Swelling    Has patient had a PCN reaction causing immediate rash, facial/tongue/throat swelling, SOB or lightheadedness with hypotension: Yes Has patient had a PCN reaction causing severe rash involving mucus membranes or skin necrosis: No  Has patient had a PCN reaction that required hospitalization: Unknown Has patient had a PCN reaction occurring within the last 10 years: No  If all of the above answers are "NO", then may proceed with Cephalosporin use.     Social History:  The patient  reports that she has never smoked. She has never used smokeless tobacco. She reports that she does not drink alcohol or use illicit drugs.   Family history:   Family History  Problem  Relation Age of Onset  . Cancer Mother     Lung  . Heart disease Brother   . Hypertension Brother   . Heart disease Brother   . Hypertension Brother     ROS:  Please see the history of present illness.  All other systems reviewed and negative.   PHYSICAL EXAM: VS:  BP 112/76 mmHg  Pulse 64  Ht 5' (1.524 m)  Wt 197 lb (89.359 kg)  BMI 38.47 kg/m2 Obese, wheelchair bound, well developed, in no acute distress HEENT: Pupils are equal round react to light accommodation extraocular movements are intact.  Neck: no JVDNo cervical lymphadenopathy. Cardiac: Regular rate and rhythm without murmurs rubs or gallops. Lungs:  clear to  auscultation bilaterally,  Slight expiratory wheeze Abd: soft, nontender, positive bowel sounds all quadrants Ext: no lower extremity edema.  2+ radial  pulses. Skin: warm and dry Neuro:  Grossly normal     ASSESSMENT AND PLAN:  Problem List Items Addressed This Visit    Morbid obesity (HCC)   Hypertension   Relevant Medications   lisinopril (PRINIVIL,ZESTRIL) 2.5 MG tablet   Hyperlipidemia - Primary (Chronic)   Relevant Medications   lisinopril (PRINIVIL,ZESTRIL) 2.5 MG tablet   Chronic diastolic heart failure (HCC)   Relevant Medications   lisinopril (PRINIVIL,ZESTRIL) 2.5 MG tablet   CAD (coronary artery disease)   Relevant Medications   lisinopril (PRINIVIL,ZESTRIL) 2.5 MG tablet      From a cardiac standpoint the patient appears to be doing well. While she was hospitalized with a hemoglobin of 6.1 the troponin only increased to 0.08.   She is on chronic oxygen but appears to be breathing comfortably while in her wheelchair.   She doesn't have any lower extremity edema and no rales or JVD on exam.   Continue torsemide.I have asked her to restart the Plavix.   She is essentially homebound as the daughter has to rent a [employer] Niece every time she takes her to an appointment. We'll have home health come out to assess her on Friday.  Since we are restarting the  Plavix, and she recently had severe anemia, we'll have home health RN draw a CBC.   Blood pressure is currently well controlled. She is on statin for her lipids. I suspect the troponin elevation was due to demand ischemia in the setting of severe anemia.  no further workup.   Continue Imdur, Lopressor.

## 2015-09-13 ENCOUNTER — Telehealth: Payer: Self-pay | Admitting: *Deleted

## 2015-09-13 NOTE — Telephone Encounter (Signed)
Per Dr. Benay Spice; notified pt's daughter [name] that bone marrow biopsy was negative for leukemia/lymphoma but did show she is iron deficient.  Dr. Benay Spice want pt to start ferrous sulfate 325 mg twice a day.  Pt verbalized understanding and expressed appreciation for call.

## 2015-09-13 NOTE — Telephone Encounter (Signed)
-----   Message from Ladene ArtistGary B Sherrill, MD sent at 09/12/2015  7:47 PM EST ----- Please call daughter, bone marrow negative for leukemia/lymphoma, has iron deficiency, start ferrous sulfate 325mg  bid if not already taking F/u as scheduled with cbc, type and hold

## 2015-09-17 ENCOUNTER — Telehealth: Payer: Self-pay | Admitting: Physician Assistant

## 2015-09-17 NOTE — Telephone Encounter (Signed)
Patient was suppose to get a CBC yesterday, however the nurse yesterday was unable to get it drawn. Today, most of the labs are closed. Instructed nurse to get it at the next available time either on Mon or Tue. Home health nurse to call patient and let her know to seek medical attention if she has severe active bleeding or feeling of passing out. Otherwise, she can wait to have labs drawn later.  Ramond DialSigned, Jens Siems PA Pager: [phone]

## 2015-09-21 LAB — TISSUE HYBRIDIZATION (BONE MARROW)-NCBH

## 2015-09-21 LAB — CHROMOSOME ANALYSIS, BONE MARROW

## 2015-09-23 ENCOUNTER — Other Ambulatory Visit: Payer: Self-pay | Admitting: *Deleted

## 2015-09-24 ENCOUNTER — Other Ambulatory Visit: Payer: Self-pay | Admitting: Cardiovascular Disease

## 2015-09-27 ENCOUNTER — Ambulatory Visit: Payer: Medicare Other | Admitting: Oncology

## 2015-09-27 ENCOUNTER — Telehealth: Payer: Self-pay | Admitting: Nurse Practitioner

## 2015-09-27 ENCOUNTER — Ambulatory Visit (HOSPITAL_BASED_OUTPATIENT_CLINIC_OR_DEPARTMENT_OTHER): Payer: Medicare Other | Admitting: Nurse Practitioner

## 2015-09-27 ENCOUNTER — Other Ambulatory Visit (HOSPITAL_BASED_OUTPATIENT_CLINIC_OR_DEPARTMENT_OTHER): Payer: Medicare Other

## 2015-09-27 ENCOUNTER — Encounter: Payer: Self-pay | Admitting: General Practice

## 2015-09-27 ENCOUNTER — Other Ambulatory Visit: Payer: Self-pay | Admitting: *Deleted

## 2015-09-27 VITALS — BP 123/52 | HR 65 | Temp 98.5°F | Resp 17 | Ht 60.0 in

## 2015-09-27 DIAGNOSIS — D696 Thrombocytopenia, unspecified: Secondary | ICD-10-CM

## 2015-09-27 DIAGNOSIS — D649 Anemia, unspecified: Secondary | ICD-10-CM

## 2015-09-27 DIAGNOSIS — D509 Iron deficiency anemia, unspecified: Secondary | ICD-10-CM

## 2015-09-27 LAB — COMPREHENSIVE METABOLIC PANEL
ALBUMIN: 2.9 g/dL — AB (ref 3.5–5.0)
ALK PHOS: 163 U/L — AB (ref 40–150)
ALT: 32 U/L (ref 0–55)
ANION GAP: 7 meq/L (ref 3–11)
AST: 72 U/L — AB (ref 5–34)
BILIRUBIN TOTAL: 0.53 mg/dL (ref 0.20–1.20)
BUN: 26.3 mg/dL — ABNORMAL HIGH (ref 7.0–26.0)
CALCIUM: 8.8 mg/dL (ref 8.4–10.4)
CO2: 30 mEq/L — ABNORMAL HIGH (ref 22–29)
Chloride: 98 mEq/L (ref 98–109)
Creatinine: 0.9 mg/dL (ref 0.6–1.1)
EGFR: 58 mL/min/{1.73_m2} — AB (ref >90)
GLUCOSE: 134 mg/dL (ref 70–140)
Potassium: 4.9 mEq/L (ref 3.5–5.1)
SODIUM: 136 meq/L (ref 136–145)
Total Protein: 6.9 g/dL (ref 6.4–8.3)

## 2015-09-27 LAB — CBC WITH DIFFERENTIAL/PLATELET
BASO%: 1.2 % (ref 0.0–2.0)
Basophils Absolute: 0.1 10*3/uL (ref 0.0–0.1)
EOS ABS: 0.3 10*3/uL (ref 0.0–0.5)
EOS%: 5.3 % (ref 0.0–7.0)
HCT: 32.5 % — ABNORMAL LOW (ref 34.8–46.6)
HEMOGLOBIN: 9.8 g/dL — AB (ref 11.6–15.9)
LYMPH%: 15.4 % (ref 14.0–49.7)
MCH: 27.3 pg (ref 25.1–34.0)
MCHC: 30.2 g/dL — ABNORMAL LOW (ref 31.5–36.0)
MCV: 90.4 fL (ref 79.5–101.0)
MONO#: 0.6 10*3/uL (ref 0.1–0.9)
MONO%: 9.2 % (ref 0.0–14.0)
NEUT%: 68.9 % (ref 38.4–76.8)
NEUTROS ABS: 4.2 10*3/uL (ref 1.5–6.5)
Platelets: 117 10*3/uL — ABNORMAL LOW (ref 145–400)
RBC: 3.6 10*6/uL — ABNORMAL LOW (ref 3.70–5.45)
RDW: 19.2 % — AB (ref 11.2–14.5)
WBC: 6.1 10*3/uL (ref 3.9–10.3)
lymph#: 0.9 10*3/uL (ref 0.9–3.3)

## 2015-09-27 LAB — HOLD TUBE, BLOOD BANK

## 2015-09-27 NOTE — Telephone Encounter (Signed)
Rx request sent to pharmacy.  

## 2015-09-27 NOTE — Telephone Encounter (Signed)
per pof to sch pt appt-gave pt copy of avs °

## 2015-09-27 NOTE — Progress Notes (Addendum)
[facility] OFFICE PROGRESS NOTE   Diagnosis:   Anemia  INTERVAL HISTORY:   [name] returns as scheduled. She is accompanied by her daughters. She is taking oral iron twice a day. She continues to have black stools which the daughter's attribute to the iron.  No maroon or burgundy stools. She has a history of constipation. Her daughters note this is worse with the iron pills. She is taking a laxative. Overall they feel she is better since discharge from the hospital last month. She has less shortness of breath and leg edema has improved.  Objective:  Vital signs in last 24 hours:  Blood pressure 123/52, pulse 65, temperature 98.5 F (36.9 C), temperature source Oral, resp. rate 17, height 5' (1.524 m), SpO2 100 %.    HEENT:  No thrush or ulcers. Resp:  Distant breath sounds. Cardio:  Distant heart sounds. GI:  Abdomen soft and nontender. No organomegaly. Vascular:  No leg edema. Neuro:  Alert.    Lab Results:  Lab Results  Component Value Date   WBC 6.1 09/27/2015   HGB 9.8* 09/27/2015   HCT 32.5* 09/27/2015   MCV 90.4 09/27/2015   PLT 117* 09/27/2015   NEUTROABS 4.2 09/27/2015    Imaging:  No results found.  Medications: I have reviewed the patient's current medications.  Assessment/Plan: 1. Anemia, status post one unit of packed red blood cells 09/01/2015-improved. She was admitted with severe anemia requiring transfusion of packed red blood cells at [facility] 08/20/2015. Bone marrow biopsy 09/09/2015 with a normocellular marrow with borderline to mild dysplastic changes; decreased iron stores. Cytogenetics normal; FISH negative for myelodysplasia. Oral iron initiated. 2. History of coronary artery disease 3. Congestive heart failure 4. 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 Admission with pneumonia and a CHF exacerbation in December 2012 6. History of mild thrombocytopenia 7. Hemoccult positive stool at [facility] November 2016; stool negative for occult blood 09/04/2015.   Disposition: She appears stable from a hematologic standpoint. Hemoglobin is stable to improved as compared to when she was discharged from the hospital 09/09/2015. She will continue oral iron.  The recent bone marrow biopsy showed decreased iron stores as well as borderline to mild dysplastic changes. The etiology of the iron deficiency is unclear. The source may be in the GI tract. We discussed endoscopic evaluation. Her daughters do not feel she could undergo this and prefer to continue oral iron/observation. We also discussed the possibility of myelodysplasia. We will follow-up on the cytogenetic results from the bone marrow biopsy (cytogenetics normal; FISH negative for myelodysplasia).  We will make a referral to a home care agency for a CBC every 4 weeks. We will see her back in 4 months. Signs and symptoms suggestive of progressive anemia were reviewed with her daughters at today's visit. They understand to contact the office should she develop these.  Patient seen with Dr. Benay Spice. 25 minutes were spent face-to-face at today's visit with the majority of that time involved in counseling/coordination of care.  Ned Card ANP/GNP-BC   09/27/2015  4:16 PM  This was a shared visit with Ned Card. The hemoglobin has stabilized since discharge from the hospital. She will continue iron. The anemia is most likely secondary to iron deficiency or myelodysplasia.  We discussed the indication for a workup for a source of blood loss with her daughters. They do not wish to pursue a GI evaluation.  She will return for an office visit in  4 months.  Julieanne Manson, M.D.

## 2015-09-28 ENCOUNTER — Telehealth: Payer: Self-pay

## 2015-09-28 ENCOUNTER — Other Ambulatory Visit: Payer: Self-pay | Admitting: Nurse Practitioner

## 2015-09-28 LAB — FERRITIN: Ferritin: 80 ng/ml (ref 9–269)

## 2015-09-28 NOTE — Telephone Encounter (Signed)
As per Rana SnareLisa K Thomas NP referral faxed to [facility] for blood work to be drawn 4,8,12 weeks and to be faxed to [phone]

## 2015-09-30 ENCOUNTER — Telehealth: Payer: Self-pay | Admitting: Family Medicine

## 2015-09-30 ENCOUNTER — Ambulatory Visit: Payer: Medicare Other | Admitting: Family Medicine

## 2015-10-03 ENCOUNTER — Telehealth: Payer: Self-pay | Admitting: Cardiovascular Disease

## 2015-10-03 NOTE — Telephone Encounter (Signed)
As info

## 2015-10-03 NOTE — Telephone Encounter (Signed)
ok 

## 2015-10-03 NOTE — Telephone Encounter (Signed)
[name]( [facility]) is calling to report a missed visit for [name] . Was schedule to go out last Thursday ( 09/29/15) and the daughter refused the visit stating that he mother did not need one .   Thanks

## 2015-10-04 NOTE — Telephone Encounter (Signed)
Pt was no show for new pt appt 09/30/15 10:00am, pt is rescheduled for 11/18/15, charge or no charge?

## 2015-10-05 NOTE — Telephone Encounter (Signed)
No charge- pt's daughter was at Oncology appt w/ her father and was unable to get mother here on time

## 2015-10-13 ENCOUNTER — Ambulatory Visit: Payer: Medicare Other | Admitting: Family Medicine

## 2015-10-18 ENCOUNTER — Telehealth: Payer: Self-pay | Admitting: Cardiovascular Disease

## 2015-10-18 NOTE — Telephone Encounter (Signed)
Pt's daughter called in wanting Burna Mortimer to call her about some new medications that the pt was given in the hospital. Please f/u with her  Thanks

## 2015-10-18 NOTE — Telephone Encounter (Signed)
Spoke to [employer].  Pt recently d/c'ed from [facility] Treated for pneumonia. We discussed med changes - she was switched from torsemide to lasix. Advised OK, continue to monitor for swelling/SOB. Pt's daughter advised to follow hosp discharge instructions.  She also had query regarding kidney function in context of this med change. Advised recent labwork stable. Kidney function appears fine based on recent BMET. Follow up w/ Franky Macho on 2/7. Call if new concerns.  Understanding was verbalized.

## 2015-10-19 ENCOUNTER — Encounter (HOSPITAL_COMMUNITY): Payer: Self-pay

## 2015-10-19 ENCOUNTER — Other Ambulatory Visit (HOSPITAL_COMMUNITY): Payer: Self-pay

## 2015-10-24 ENCOUNTER — Telehealth: Payer: Self-pay | Admitting: Family Medicine

## 2015-10-24 NOTE — Telephone Encounter (Signed)
Daughter Angelyn Punt called to schedule Hospital f/u appt with Dr. Beverely Low - 10/26/15

## 2015-10-26 ENCOUNTER — Encounter: Payer: Self-pay | Admitting: Family Medicine

## 2015-10-26 ENCOUNTER — Ambulatory Visit (INDEPENDENT_AMBULATORY_CARE_PROVIDER_SITE_OTHER): Payer: Medicare Other | Admitting: Family Medicine

## 2015-10-26 VITALS — BP 114/78 | HR 72 | Temp 98.6°F | Ht 60.0 in | Wt 197.0 lb

## 2015-10-26 DIAGNOSIS — E785 Hyperlipidemia, unspecified: Secondary | ICD-10-CM | POA: Diagnosis not present

## 2015-10-26 DIAGNOSIS — J189 Pneumonia, unspecified organism: Secondary | ICD-10-CM

## 2015-10-26 DIAGNOSIS — I779 Disorder of arteries and arterioles, unspecified: Secondary | ICD-10-CM

## 2015-10-26 DIAGNOSIS — I5022 Chronic systolic (congestive) heart failure: Secondary | ICD-10-CM | POA: Diagnosis not present

## 2015-10-26 DIAGNOSIS — I739 Peripheral vascular disease, unspecified: Secondary | ICD-10-CM

## 2015-10-26 DIAGNOSIS — E113319 Type 2 diabetes mellitus with moderate nonproliferative diabetic retinopathy with macular edema, unspecified eye: Secondary | ICD-10-CM

## 2015-10-26 LAB — CBC WITH DIFFERENTIAL/PLATELET
BASOS ABS: 0 10*3/uL (ref 0.0–0.1)
Basophils Relative: 0.2 % (ref 0.0–3.0)
EOS PCT: 1.4 % (ref 0.0–5.0)
Eosinophils Absolute: 0.1 10*3/uL (ref 0.0–0.7)
HEMATOCRIT: 29.6 % — AB (ref 36.0–46.0)
HEMOGLOBIN: 9.1 g/dL — AB (ref 12.0–15.0)
LYMPHS PCT: 9.6 % — AB (ref 12.0–46.0)
Lymphs Abs: 0.9 10*3/uL (ref 0.7–4.0)
MCHC: 30.6 g/dL (ref 30.0–36.0)
MCV: 89.9 fl (ref 78.0–100.0)
MONOS PCT: 9.9 % (ref 3.0–12.0)
Monocytes Absolute: 0.9 10*3/uL (ref 0.1–1.0)
NEUTROS PCT: 78.9 % — AB (ref 43.0–77.0)
Neutro Abs: 7.1 10*3/uL (ref 1.4–7.7)
Platelets: 102 10*3/uL — ABNORMAL LOW (ref 150.0–400.0)
RBC: 3.29 Mil/uL — AB (ref 3.87–5.11)
RDW: 20.1 % — ABNORMAL HIGH (ref 11.5–15.5)
WBC: 9.1 10*3/uL (ref 4.0–10.5)

## 2015-10-26 LAB — BASIC METABOLIC PANEL
BUN: 32 mg/dL — ABNORMAL HIGH (ref 6–23)
CALCIUM: 8.2 mg/dL — AB (ref 8.4–10.5)
CO2: 37 meq/L — AB (ref 19–32)
CREATININE: 1.2 mg/dL (ref 0.40–1.20)
Chloride: 90 mEq/L — ABNORMAL LOW (ref 96–112)
GFR: 45.7 mL/min — AB (ref >60.00)
GLUCOSE: 119 mg/dL — AB (ref 70–99)
Potassium: 4.1 mEq/L (ref 3.5–5.1)
SODIUM: 136 meq/L (ref 135–145)

## 2015-10-26 LAB — HEPATIC FUNCTION PANEL
ALBUMIN: 3.7 g/dL (ref 3.5–5.2)
ALT: 34 U/L (ref 0–35)
AST: 49 U/L — ABNORMAL HIGH (ref 0–37)
Alkaline Phosphatase: 111 U/L (ref 39–117)
Bilirubin, Direct: 0.2 mg/dL (ref 0.0–0.3)
TOTAL PROTEIN: 6.5 g/dL (ref 6.0–8.3)
Total Bilirubin: 0.6 mg/dL (ref 0.2–1.2)

## 2015-10-26 MED ORDER — ATORVASTATIN CALCIUM 40 MG PO TABS: 40.0000 mg | ORAL_TABLET | Freq: Every day | ORAL | Status: DC

## 2015-10-26 NOTE — Assessment & Plan Note (Signed)
New to provider, ongoing for pt.  Following w/ Cardiology.  Recent hospitalization showed Pro-BNP of 28,000+.  Pt's d/c summary indicated she was to be on Lasix for 1 week after d/c but daughter currently has her on Torsemide (which is her usual home medication).  She is asymptomatic today and daughter reports her edema is at baseline.  She has f/u w/ cards next week.  At that time, depending on her repeat Cr- they will need to decide if she needs to restart Lisinopril.  Daughter expressed understanding.

## 2015-10-26 NOTE — Assessment & Plan Note (Signed)
Chronic problem.  Daughter reports that pt is taking Lipitor  nightly despite this not being on her medication list.  Last lipid panel was December.  Will plan to repeat in 6 months.

## 2015-10-26 NOTE — Assessment & Plan Note (Signed)
New to provider, pt has hx of this.  After looking in both the imaging and cardiac procedures section, I don't see any recent carotid imaging.  This may have been done at her previous PCP's office (not on [facility]) and she is on both a statin and Plavix at this time.  Will forward note to cardiology to see if they see any value in doing additional imaging at this time.

## 2015-10-26 NOTE — Progress Notes (Signed)
Pre visit review using our clinic review tool, if applicable. No additional management support is needed unless otherwise documented below in the visit note. 

## 2015-10-26 NOTE — Patient Instructions (Signed)
Schedule a diabetes and cholesterol check in 6 months We'll notify you of your lab results and make any changes if needed Continue her Torsemide as directed by Cardiology Finish the Doxycycline as directed I will ask cardiology about her carotid arteries (I do not see a recent ultrasound) Call with any questions or concerns Hang in there!!!

## 2015-10-26 NOTE — Assessment & Plan Note (Signed)
New to provider, recurrent issue for pt.  Pt just d/c'd from [facility] w/ Strep Pneumo and is to complete course of Doxy.  Has almost finished her dose pack.  Lungs are coarse but no true wheezing or crackles today.  No changes at this time.

## 2015-10-26 NOTE — Assessment & Plan Note (Signed)
Chronic problem.  Pt is on Januvia w/ excellent A1C control.  May not require Januvia at this time but since this is our first visit, will not stop it as daughter seemed very concerned when I mentioned this.  She was on Lisinopril up until d/c from [facility] on 1/27.  Will let cardiology determine if she should restart when she seems them next week.  Will repeat A1C in 6 months and at that time discuss w/ daughter and pt if there is utility to getting an eye exam.  Will follow.

## 2015-10-26 NOTE — Progress Notes (Signed)
   Subjective:    Patient ID: [name], female    DOB: 06-04-1931, 82 y.o.   MRN: [id number]  HPI New to establish.  Pt was recently hospitalized 1/19-27 at [facility] for LLL PNA (Strep Pneumo).  Found to have EF of 40% w/ ProBNP 28,857.  Was d/c'd on Lasix, Doxy, PredPack.  Daughter has pt on Torsemide rather than lasix- reports this was stopped at time of hospital d/c.  AST/ALT at time of d/c was 74/65.  Hgb 8.6- seeing Dr Alcide Evener.  Daughter reports pt is still coughing, wheezing.  Pt is O2 dependent since 2009.  No fevers since coming home.    DM- last A1C done in Dec 5.6.  Currently on Januvia.  No longer on Lisinopril due to elevated Cr during recent hospitalization.  Pt denies CP, + SOB due to recent PNA, denies HAs, visual changes, + chronic edema.  CVA- R side is paralyzed from previous CVA.  On Plavix.  On Atorvastatin - but not currently on pt's med list.   Review of Systems For ROS see HPI- limited due to language barrier and unclear as to level of understanding that pt has (daughter answers most questions for pt)    Objective:   Physical Exam  Constitutional: She appears well-developed and well-nourished. No distress.  Sitting in wheel chair  HENT:  Head: Normocephalic and atraumatic.  Neck: No thyromegaly present.  Cardiovascular: Normal rate, regular rhythm and intact distal pulses.   Distant heart sounds  Pulmonary/Chest: Effort normal. No respiratory distress. She has no wheezes.  Coarse BS scattered throughout w/o true wheezing  Abdominal: Soft. She exhibits no distension. There is no tenderness. There is no rebound.  Musculoskeletal: She exhibits edema (bilateral 1-2+ edema of feet). She exhibits no tenderness.  Lymphadenopathy:    She has no cervical adenopathy.  Neurological: She is alert.  R hemiparesis  Skin: Skin is warm and dry.  Purple coloration of R 2nd toe w/o evidence of cellulitis or gangrene  Vitals reviewed.         Assessment & Plan:

## 2015-11-01 ENCOUNTER — Telehealth: Payer: Self-pay | Admitting: Cardiology

## 2015-11-01 ENCOUNTER — Encounter: Payer: Self-pay | Admitting: Cardiology

## 2015-11-01 ENCOUNTER — Ambulatory Visit (INDEPENDENT_AMBULATORY_CARE_PROVIDER_SITE_OTHER): Payer: Medicare Other | Admitting: Cardiology

## 2015-11-01 VITALS — BP 160/70 | HR 72 | Ht 60.0 in | Wt 199.0 lb

## 2015-11-01 DIAGNOSIS — I5033 Acute on chronic diastolic (congestive) heart failure: Secondary | ICD-10-CM

## 2015-11-01 DIAGNOSIS — R600 Localized edema: Secondary | ICD-10-CM | POA: Diagnosis not present

## 2015-11-01 DIAGNOSIS — Z9861 Coronary angioplasty status: Secondary | ICD-10-CM

## 2015-11-01 DIAGNOSIS — Z951 Presence of aortocoronary bypass graft: Secondary | ICD-10-CM | POA: Insufficient documentation

## 2015-11-01 DIAGNOSIS — D509 Iron deficiency anemia, unspecified: Secondary | ICD-10-CM | POA: Diagnosis not present

## 2015-11-01 DIAGNOSIS — I2510 Atherosclerotic heart disease of native coronary artery without angina pectoris: Secondary | ICD-10-CM

## 2015-11-01 MED ORDER — TORSEMIDE 20 MG PO TABS: 40.0000 mg | ORAL_TABLET | Freq: Every day | ORAL | Status: DC

## 2015-11-01 NOTE — Patient Instructions (Signed)
Medication Instructions:   INCREASE TORSEMIDE TO 40 MG ONCE DAILY= 2 OF THE 20 MG TABLETS ONCE DAILY  Labwork:  Your physician recommends that you return for lab work Monday 11-07-15  Follow-Up:  Your physician recommends that you schedule a follow-up appointment in: 4-6 WEEKS WITH DR Tresa Endo OR Franky Macho

## 2015-11-01 NOTE — Telephone Encounter (Signed)
Faxed signed Release by patient to [facility] to obtain records per [facility], [place].  Faxed to [phone] on 11/01/15. lp

## 2015-11-01 NOTE — Assessment & Plan Note (Signed)
Multiple admission and transfusions- followed by heme/onc.

## 2015-11-01 NOTE — Assessment & Plan Note (Signed)
LIMA-LAD, LRA-OM, SVG-Dx, SVG-RCA

## 2015-11-01 NOTE — Assessment & Plan Note (Signed)
Pt had an abn Myoview - cath done Jan '06 complicated by CVA. Pt was brought back for PCI in Feb 2006. Myoview low risk Nov 2013

## 2015-11-01 NOTE — Assessment & Plan Note (Signed)
Pt apparently admitted to [facility] in Jan and had CHF.

## 2015-11-01 NOTE — Progress Notes (Signed)
11/01/2015 [name]   [birth year]  [id number]  Primary Physician [name], MD Primary Cardiologist: Dr Tresa Endo  HPI:  82 y.o. female from [place] with a past medical history significant for coronary artery disease status post CABG in June 2000, with the LIMA to LAD, left radial artery to the OM, SVG to the PDA and SVG to the diagonal. She cardiac catheterization January 2006 and ultimately suffered a neurologic event post catheterization. She underwent SVG to PDA cipher stenting in 10/26/2004. Her last Myoview was in Nov of 2013 which showed normal perfusion. An echocardiogram done in Dec 2016 showed an EF of 50% with diastolic dysfunction. She has had multiple admissions for anemia requiring transfusion. Etiology is not clear. [facility] In reviewing her records it appears she has been admitted to the hospital in [place] in Nov 2016, Dec 2016, and Jan 2016. She is here today for follow up from this hospitalization, unfortunately I have no records and the family history is less than clear. The pt is in a wheelchair and offers  No history. The family says she has LE edema, worse than usual.   Current Outpatient Prescriptions  Medication Sig Dispense Refill  . acetaminophen (TYLENOL) 500 MG tablet Take 1,000 mg by mouth every 6 (six) hours as needed. pain     . albuterol (PROVENTIL) (2.5 MG/3ML) 0.083% nebulizer solution 2.5 mg 2 (two) times daily.    Marland Kitchen albuterol-ipratropium (COMBIVENT) 18-103 MCG/ACT inhaler Inhale 18-103 mcg into the lungs every 4 (four) hours.    Marland Kitchen allopurinol (ZYLOPRIM) 100 MG tablet Take 100 mg by mouth daily.    Marland Kitchen atorvastatin (LIPITOR) 40 MG tablet Take 1 tablet (40 mg total) by mouth daily. 90 tablet 3  . clopidogrel (PLAVIX) 75 MG tablet Take 1 tablet (75 mg total) by mouth daily. 90 tablet 3  . colchicine 0.6 MG tablet Take 0.6 mg by mouth daily.     . folic acid (FOLVITE) 1 MG tablet TAKE ONE TABLET BY MOUTH ONCE DAILY  30 tablet 11  . isosorbide mononitrate (IMDUR) 60 MG 24 hr tablet Take 1 tablet (60 mg total) by mouth daily. NEED OV. 30 tablet 2  . JANUVIA 50 MG tablet Take 50 mg by mouth daily with breakfast.     . metoprolol (LOPRESSOR) 50 MG tablet Take 1 tablet (50 mg total) by mouth 2 (two) times daily. 30 tablet 6  . nitroGLYCERIN (NITROSTAT) 0.4 MG SL tablet Place 1 tablet (0.4 mg total) under the tongue as needed. 25 tablet 4  . omega-3 acid ethyl esters (LOVAZA) 1 G capsule TAKE TWO CAPSULES BY MOUTH TWICE DAILY 120 capsule 10  . pantoprazole (PROTONIX) 40 MG tablet Take 1 tablet (40 mg total) by mouth daily. Make appt for refills. 30 tablet 6  . polysaccharide iron (NIFEREX) 150 MG CAPS capsule Take 150 mg by mouth daily.     . primidone (MYSOLINE) 50 MG tablet Take 50 mg by mouth at bedtime.     . senna (SENOKOT) 8.6 MG tablet Take 2 tablets by mouth at bedtime.     . torsemide (DEMADEX) 20 MG tablet Take 2 tablets (40 mg total) by mouth daily. 60 tablet 6  . [DISCONTINUED] pregabalin (LYRICA) 75 MG capsule Take 75 mg by mouth daily.       No current facility-administered medications for this visit.    Allergies  Allergen Reactions  . Penicillins Anaphylaxis, Itching and Swelling  Has patient had a PCN reaction causing immediate rash, facial/tongue/throat swelling, SOB or lightheadedness with hypotension: Yes Has patient had a PCN reaction causing severe rash involving mucus membranes or skin necrosis: No  Has patient had a PCN reaction that required hospitalization: Unknown Has patient had a PCN reaction occurring within the last 10 years: No  If all of the above answers are "NO", then may proceed with Cephalosporin use.     Social History   Social History  . Marital Status: Married    Spouse Name: N/A  . Number of Children: N/A  . Years of Education: N/A   Occupational History  . Not on file.   Social History Main Topics  . Smoking status: Never Smoker   . Smokeless tobacco:  Never Used  . Alcohol Use: No  . Drug Use: No  . Sexual Activity: No   Other Topics Concern  . Not on file   Social History Narrative     Review of Systems: General: negative for chills, fever, night sweats or weight changes.  Cardiovascular: negative for chest pain, dyspnea on exertion, edema, orthopnea, palpitations, paroxysmal nocturnal dyspnea or shortness of breath Dermatological: negative for rash Respiratory: negative for cough or wheezing Urologic: negative for hematuria Abdominal: negative for nausea, vomiting, diarrhea, bright red blood per rectum, melena, or hematemesis Neurologic: negative for visual changes, syncope, or dizziness All other systems reviewed and are otherwise negative except as noted above.    Blood pressure 160/70, pulse 72, height 5' (1.524 m), weight 199 lb (90.266 kg).  General appearance: cooperative, no distress, morbidly obese and pale Lungs: dereased breath sounds Heart: regular rate and rhythm Extremities: 1+ edema   ASSESSMENT AND PLAN:   Diastolic CHF, acute on chronic (HCC) Pt apparently admitted to [facility] in Jan and had CHF.  CAD S/P RCA PCI Feb '06 Pt had an abn Myoview - cath done Jan '06 complicated by CVA. Pt was brought back for PCI in Feb 2006. Myoview low risk Nov 2013  Iron deficiency anemia Multiple admission and transfusions- followed by heme/onc.   Hx of CABG-'00 LIMA-LAD, LRA-OM, SVG-Dx, SVG-RCA   PLAN  I will attempt to get records from [facility]. I did increase her Demadex to 40 mg daily. She should have a BMP and BNP Monday. I explained to the family that is difficult to give her consistent care if she is going back and forth between two health systems. She did apparently have an echo that showed her EF to be 40%. Her BNP was elevated. Her SCr was stable but her BUN was 60, Hgb around 8, normal for her.   Corine Shelter K PA-C 11/01/2015 5:16 PM

## 2015-11-02 ENCOUNTER — Telehealth: Payer: Self-pay | Admitting: Cardiovascular Disease

## 2015-11-02 NOTE — Telephone Encounter (Signed)
Received records from [facility] per Corine Shelter, [place] request.  Patient also has an appointment with Dr Tresa Endo on 12/13/15.  Records given to Dr Tresa Endo to review. lp

## 2015-11-03 ENCOUNTER — Telehealth: Payer: Self-pay | Admitting: Cardiovascular Disease

## 2015-11-03 ENCOUNTER — Telehealth: Payer: Self-pay | Admitting: Family Medicine

## 2015-11-03 NOTE — Telephone Encounter (Signed)
New Message  RN from [facility]- was asked by pt dtr if she can draw the labs ordered by [facility] on 2/7 instead of pt traveling to [facility]. Please call back and discuss.

## 2015-11-03 NOTE — Telephone Encounter (Signed)
Pt daughter calling to inquire on the status of FMLA forms. Please [name]

## 2015-11-03 NOTE — Telephone Encounter (Signed)
Spoke with pt daughter and advised, they are on their way to [place] now.   Please advise pt daughter advised that she dropped off FMLA paperwork at last appointment. Have you seen this?

## 2015-11-03 NOTE — Telephone Encounter (Signed)
[facility]([place] ph # [phone] ext [phone]) calling, nurse can go out today to place catheter. Patient is unable to void. May give verbal order.

## 2015-11-03 NOTE — Telephone Encounter (Signed)
Returned call. OK'd labs to be drawn by Amy RN w [facility] home care.

## 2015-11-03 NOTE — Telephone Encounter (Signed)
daugther Raja called stating [facility] called here and we told them pt had not seen Dr. Beverely Low. Pt was seen here. Daughter said that she would like [place] to order [facility] for pt to have catheter. She has not been able to urinate and is uncomfortable and cannot sleep. Raja requesting call back.

## 2015-11-03 NOTE — Telephone Encounter (Signed)
This was completed earlier this week and placed in the paperwork folder for [name] to charge and complete

## 2015-11-03 NOTE — Telephone Encounter (Signed)
Since pt has acute urinary retention (and we have no history of this previously), I need her to go to the ER for catheterization and urology consult/evaluation.  I don't feel comfortable signing an order for cath w/o evaluation, imaging, etc.

## 2015-11-04 ENCOUNTER — Encounter: Payer: Self-pay | Admitting: Family Medicine

## 2015-11-04 NOTE — Telephone Encounter (Signed)
error:[id number] ° °

## 2015-11-04 NOTE — Telephone Encounter (Signed)
Called and informed pt daughter, she will contact [facility] today.

## 2015-11-04 NOTE — Telephone Encounter (Signed)
Foms were faxed on Tues, 11/01/15; please have patient follow-up with [facility] for status/SLS 02/10

## 2015-11-07 ENCOUNTER — Telehealth: Payer: Self-pay | Admitting: Family Medicine

## 2015-11-07 ENCOUNTER — Emergency Department (HOSPITAL_COMMUNITY): Payer: Medicare Other

## 2015-11-07 ENCOUNTER — Inpatient Hospital Stay (HOSPITAL_COMMUNITY)
Admission: EM | Admit: 2015-11-07 | Discharge: 2015-11-25 | DRG: 004 | Disposition: A | Discharge status: Other Acute Inpatient Hospital | Payer: Medicare Other | Attending: Internal Medicine | Admitting: Internal Medicine

## 2015-11-07 ENCOUNTER — Inpatient Hospital Stay (HOSPITAL_COMMUNITY): Payer: Medicare Other

## 2015-11-07 ENCOUNTER — Telehealth: Payer: Self-pay | Admitting: Cardiovascular Disease

## 2015-11-07 ENCOUNTER — Encounter (HOSPITAL_COMMUNITY): Payer: Self-pay | Admitting: *Deleted

## 2015-11-07 DIAGNOSIS — N390 Urinary tract infection, site not specified: Secondary | ICD-10-CM | POA: Diagnosis present

## 2015-11-07 DIAGNOSIS — Z4659 Encounter for fitting and adjustment of other gastrointestinal appliance and device: Secondary | ICD-10-CM

## 2015-11-07 DIAGNOSIS — J9601 Acute respiratory failure with hypoxia: Secondary | ICD-10-CM | POA: Diagnosis not present

## 2015-11-07 DIAGNOSIS — I447 Left bundle-branch block, unspecified: Secondary | ICD-10-CM | POA: Diagnosis present

## 2015-11-07 DIAGNOSIS — E1159 Type 2 diabetes mellitus with other circulatory complications: Secondary | ICD-10-CM | POA: Diagnosis not present

## 2015-11-07 DIAGNOSIS — R4182 Altered mental status, unspecified: Secondary | ICD-10-CM | POA: Diagnosis present

## 2015-11-07 DIAGNOSIS — N19 Unspecified kidney failure: Secondary | ICD-10-CM | POA: Diagnosis not present

## 2015-11-07 DIAGNOSIS — I10 Essential (primary) hypertension: Secondary | ICD-10-CM | POA: Diagnosis present

## 2015-11-07 DIAGNOSIS — Z7901 Long term (current) use of anticoagulants: Secondary | ICD-10-CM

## 2015-11-07 DIAGNOSIS — E870 Hyperosmolality and hypernatremia: Secondary | ICD-10-CM | POA: Diagnosis not present

## 2015-11-07 DIAGNOSIS — Z9289 Personal history of other medical treatment: Secondary | ICD-10-CM

## 2015-11-07 DIAGNOSIS — I130 Hypertensive heart and chronic kidney disease with heart failure and stage 1 through stage 4 chronic kidney disease, or unspecified chronic kidney disease: Secondary | ICD-10-CM | POA: Diagnosis present

## 2015-11-07 DIAGNOSIS — G4733 Obstructive sleep apnea (adult) (pediatric): Secondary | ICD-10-CM | POA: Diagnosis present

## 2015-11-07 DIAGNOSIS — Z809 Family history of malignant neoplasm, unspecified: Secondary | ICD-10-CM

## 2015-11-07 DIAGNOSIS — Z9861 Coronary angioplasty status: Secondary | ICD-10-CM | POA: Diagnosis not present

## 2015-11-07 DIAGNOSIS — N183 Chronic kidney disease, stage 3 (moderate): Secondary | ICD-10-CM | POA: Diagnosis present

## 2015-11-07 DIAGNOSIS — R748 Abnormal levels of other serum enzymes: Secondary | ICD-10-CM | POA: Diagnosis present

## 2015-11-07 DIAGNOSIS — I509 Heart failure, unspecified: Secondary | ICD-10-CM | POA: Diagnosis not present

## 2015-11-07 DIAGNOSIS — G20 Parkinson's disease: Secondary | ICD-10-CM | POA: Diagnosis present

## 2015-11-07 DIAGNOSIS — E871 Hypo-osmolality and hyponatremia: Secondary | ICD-10-CM | POA: Diagnosis present

## 2015-11-07 DIAGNOSIS — J9611 Chronic respiratory failure with hypoxia: Secondary | ICD-10-CM | POA: Diagnosis not present

## 2015-11-07 DIAGNOSIS — Z978 Presence of other specified devices: Secondary | ICD-10-CM

## 2015-11-07 DIAGNOSIS — J9690 Respiratory failure, unspecified, unspecified whether with hypoxia or hypercapnia: Secondary | ICD-10-CM

## 2015-11-07 DIAGNOSIS — E876 Hypokalemia: Secondary | ICD-10-CM | POA: Diagnosis not present

## 2015-11-07 DIAGNOSIS — I493 Ventricular premature depolarization: Secondary | ICD-10-CM | POA: Diagnosis present

## 2015-11-07 DIAGNOSIS — J9811 Atelectasis: Secondary | ICD-10-CM | POA: Diagnosis not present

## 2015-11-07 DIAGNOSIS — I69351 Hemiplegia and hemiparesis following cerebral infarction affecting right dominant side: Secondary | ICD-10-CM | POA: Diagnosis not present

## 2015-11-07 DIAGNOSIS — R1310 Dysphagia, unspecified: Secondary | ICD-10-CM | POA: Diagnosis present

## 2015-11-07 DIAGNOSIS — T17990A Other foreign object in respiratory tract, part unspecified in causing asphyxiation, initial encounter: Secondary | ICD-10-CM | POA: Diagnosis not present

## 2015-11-07 DIAGNOSIS — D696 Thrombocytopenia, unspecified: Secondary | ICD-10-CM | POA: Diagnosis present

## 2015-11-07 DIAGNOSIS — R0602 Shortness of breath: Secondary | ICD-10-CM

## 2015-11-07 DIAGNOSIS — R400 Somnolence: Secondary | ICD-10-CM | POA: Insufficient documentation

## 2015-11-07 DIAGNOSIS — G9341 Metabolic encephalopathy: Secondary | ICD-10-CM | POA: Diagnosis present

## 2015-11-07 DIAGNOSIS — E1122 Type 2 diabetes mellitus with diabetic chronic kidney disease: Secondary | ICD-10-CM | POA: Diagnosis present

## 2015-11-07 DIAGNOSIS — G9340 Encephalopathy, unspecified: Secondary | ICD-10-CM | POA: Diagnosis present

## 2015-11-07 DIAGNOSIS — R000 Tachycardia, unspecified: Secondary | ICD-10-CM | POA: Diagnosis not present

## 2015-11-07 DIAGNOSIS — D649 Anemia, unspecified: Secondary | ICD-10-CM | POA: Diagnosis present

## 2015-11-07 DIAGNOSIS — Z6834 Body mass index (BMI) 34.0-34.9, adult: Secondary | ICD-10-CM

## 2015-11-07 DIAGNOSIS — I255 Ischemic cardiomyopathy: Secondary | ICD-10-CM | POA: Diagnosis present

## 2015-11-07 DIAGNOSIS — Z951 Presence of aortocoronary bypass graft: Secondary | ICD-10-CM | POA: Diagnosis not present

## 2015-11-07 DIAGNOSIS — I2510 Atherosclerotic heart disease of native coronary artery without angina pectoris: Secondary | ICD-10-CM | POA: Diagnosis present

## 2015-11-07 DIAGNOSIS — E46 Unspecified protein-calorie malnutrition: Secondary | ICD-10-CM | POA: Diagnosis not present

## 2015-11-07 DIAGNOSIS — R042 Hemoptysis: Secondary | ICD-10-CM | POA: Diagnosis not present

## 2015-11-07 DIAGNOSIS — I5043 Acute on chronic combined systolic (congestive) and diastolic (congestive) heart failure: Secondary | ICD-10-CM | POA: Diagnosis present

## 2015-11-07 DIAGNOSIS — Z8249 Family history of ischemic heart disease and other diseases of the circulatory system: Secondary | ICD-10-CM

## 2015-11-07 DIAGNOSIS — J9600 Acute respiratory failure, unspecified whether with hypoxia or hypercapnia: Secondary | ICD-10-CM

## 2015-11-07 DIAGNOSIS — Z7401 Bed confinement status: Secondary | ICD-10-CM

## 2015-11-07 DIAGNOSIS — Z955 Presence of coronary angioplasty implant and graft: Secondary | ICD-10-CM

## 2015-11-07 DIAGNOSIS — J9621 Acute and chronic respiratory failure with hypoxia: Secondary | ICD-10-CM | POA: Diagnosis not present

## 2015-11-07 DIAGNOSIS — Z515 Encounter for palliative care: Secondary | ICD-10-CM | POA: Diagnosis not present

## 2015-11-07 DIAGNOSIS — J9620 Acute and chronic respiratory failure, unspecified whether with hypoxia or hypercapnia: Secondary | ICD-10-CM | POA: Diagnosis not present

## 2015-11-07 DIAGNOSIS — L8990 Pressure ulcer of unspecified site, unspecified stage: Secondary | ICD-10-CM | POA: Insufficient documentation

## 2015-11-07 DIAGNOSIS — Z9911 Dependence on respirator [ventilator] status: Secondary | ICD-10-CM | POA: Diagnosis not present

## 2015-11-07 DIAGNOSIS — Z9889 Other specified postprocedural states: Secondary | ICD-10-CM

## 2015-11-07 DIAGNOSIS — K5900 Constipation, unspecified: Secondary | ICD-10-CM | POA: Diagnosis not present

## 2015-11-07 DIAGNOSIS — J810 Acute pulmonary edema: Secondary | ICD-10-CM | POA: Diagnosis not present

## 2015-11-07 DIAGNOSIS — N179 Acute kidney failure, unspecified: Secondary | ICD-10-CM | POA: Diagnosis present

## 2015-11-07 DIAGNOSIS — D689 Coagulation defect, unspecified: Secondary | ICD-10-CM | POA: Diagnosis not present

## 2015-11-07 DIAGNOSIS — R609 Edema, unspecified: Secondary | ICD-10-CM

## 2015-11-07 DIAGNOSIS — I5033 Acute on chronic diastolic (congestive) heart failure: Secondary | ICD-10-CM | POA: Diagnosis not present

## 2015-11-07 DIAGNOSIS — J189 Pneumonia, unspecified organism: Secondary | ICD-10-CM | POA: Diagnosis present

## 2015-11-07 DIAGNOSIS — R0902 Hypoxemia: Secondary | ICD-10-CM | POA: Diagnosis not present

## 2015-11-07 DIAGNOSIS — I129 Hypertensive chronic kidney disease with stage 1 through stage 4 chronic kidney disease, or unspecified chronic kidney disease: Secondary | ICD-10-CM | POA: Diagnosis present

## 2015-11-07 DIAGNOSIS — Z7902 Long term (current) use of antithrombotics/antiplatelets: Secondary | ICD-10-CM | POA: Diagnosis not present

## 2015-11-07 DIAGNOSIS — J939 Pneumothorax, unspecified: Secondary | ICD-10-CM

## 2015-11-07 DIAGNOSIS — I5023 Acute on chronic systolic (congestive) heart failure: Secondary | ICD-10-CM | POA: Diagnosis not present

## 2015-11-07 DIAGNOSIS — Z01818 Encounter for other preprocedural examination: Secondary | ICD-10-CM

## 2015-11-07 DIAGNOSIS — K761 Chronic passive congestion of liver: Secondary | ICD-10-CM | POA: Diagnosis present

## 2015-11-07 DIAGNOSIS — J9509 Other tracheostomy complication: Secondary | ICD-10-CM | POA: Diagnosis not present

## 2015-11-07 DIAGNOSIS — Z7189 Other specified counseling: Secondary | ICD-10-CM | POA: Diagnosis not present

## 2015-11-07 DIAGNOSIS — K7290 Hepatic failure, unspecified without coma: Secondary | ICD-10-CM | POA: Diagnosis present

## 2015-11-07 DIAGNOSIS — Z930 Tracheostomy status: Secondary | ICD-10-CM | POA: Diagnosis not present

## 2015-11-07 DIAGNOSIS — J811 Chronic pulmonary edema: Secondary | ICD-10-CM

## 2015-11-07 DIAGNOSIS — J9602 Acute respiratory failure with hypercapnia: Principal | ICD-10-CM | POA: Diagnosis present

## 2015-11-07 LAB — URINE MICROSCOPIC-ADD ON

## 2015-11-07 LAB — COMPREHENSIVE METABOLIC PANEL
ALK PHOS: 123 U/L (ref 38–126)
ALT: 28 U/L (ref 14–54)
ANION GAP: 12 (ref 5–15)
AST: 64 U/L — ABNORMAL HIGH (ref 15–41)
Albumin: 2.9 g/dL — ABNORMAL LOW (ref 3.5–5.0)
BILIRUBIN TOTAL: 0.6 mg/dL (ref 0.3–1.2)
BUN: 45 mg/dL — ABNORMAL HIGH (ref 6–20)
CALCIUM: 8.2 mg/dL — AB (ref 8.9–10.3)
CO2: 29 mmol/L (ref 22–32)
Chloride: 90 mmol/L — ABNORMAL LOW (ref 101–111)
Creatinine, Ser: 1.43 mg/dL — ABNORMAL HIGH (ref 0.44–1.00)
GFR calc non Af Amer: 33 mL/min — ABNORMAL LOW (ref >60)
GFR, EST AFRICAN AMERICAN: 38 mL/min — AB (ref >60)
Glucose, Bld: 110 mg/dL — ABNORMAL HIGH (ref 65–99)
Potassium: 4.9 mmol/L (ref 3.5–5.1)
SODIUM: 131 mmol/L — AB (ref 135–145)
TOTAL PROTEIN: 6 g/dL — AB (ref 6.5–8.1)

## 2015-11-07 LAB — URINALYSIS, ROUTINE W REFLEX MICROSCOPIC
BILIRUBIN URINE: NEGATIVE
Glucose, UA: NEGATIVE mg/dL
HGB URINE DIPSTICK: NEGATIVE
Ketones, ur: NEGATIVE mg/dL
Nitrite: NEGATIVE
PROTEIN: NEGATIVE mg/dL
SPECIFIC GRAVITY, URINE: 1.008 (ref 1.005–1.030)
pH: 5 (ref 5.0–8.0)

## 2015-11-07 LAB — DIFFERENTIAL
BASOS ABS: 0 10*3/uL (ref 0.0–0.1)
Basophils Relative: 0 %
EOS PCT: 1 %
Eosinophils Absolute: 0.1 10*3/uL (ref 0.0–0.7)
Lymphocytes Relative: 12 %
Lymphs Abs: 0.9 10*3/uL (ref 0.7–4.0)
MONO ABS: 1 10*3/uL (ref 0.1–1.0)
MONOS PCT: 13 %
NEUTROS ABS: 5.9 10*3/uL (ref 1.7–7.7)
NEUTROS PCT: 75 %

## 2015-11-07 LAB — CBC
HCT: 26.9 % — ABNORMAL LOW (ref 36.0–46.0)
Hemoglobin: 7.9 g/dL — ABNORMAL LOW (ref 12.0–15.0)
MCH: 26.6 pg (ref 26.0–34.0)
MCHC: 29.4 g/dL — AB (ref 30.0–36.0)
MCV: 90.6 fL (ref 78.0–100.0)
PLATELETS: 101 10*3/uL — AB (ref 150–400)
RBC: 2.97 MIL/uL — ABNORMAL LOW (ref 3.87–5.11)
RDW: 18.8 % — AB (ref 11.5–15.5)
WBC: 7.9 10*3/uL (ref 4.0–10.5)

## 2015-11-07 LAB — I-STAT CHEM 8, ED
BUN: 47 mg/dL — ABNORMAL HIGH (ref 6–20)
CALCIUM ION: 1.01 mmol/L — AB (ref 1.13–1.30)
Chloride: 88 mmol/L — ABNORMAL LOW (ref 101–111)
Creatinine, Ser: 1.5 mg/dL — ABNORMAL HIGH (ref 0.44–1.00)
GLUCOSE: 107 mg/dL — AB (ref 65–99)
HCT: 31 % — ABNORMAL LOW (ref 36.0–46.0)
HEMOGLOBIN: 10.5 g/dL — AB (ref 12.0–15.0)
Potassium: 4.8 mmol/L (ref 3.5–5.1)
Sodium: 129 mmol/L — ABNORMAL LOW (ref 135–145)
TCO2: 33 mmol/L (ref 0–100)

## 2015-11-07 LAB — I-STAT TROPONIN, ED
TROPONIN I, POC: 0.43 ng/mL — AB (ref 0.00–0.08)
Troponin i, poc: 0.47 ng/mL (ref 0.00–0.08)

## 2015-11-07 LAB — PROTIME-INR
INR: 1.05 (ref 0.00–1.49)
PROTHROMBIN TIME: 13.9 s (ref 11.6–15.2)

## 2015-11-07 LAB — APTT: APTT: 39 s — AB (ref 24–37)

## 2015-11-07 MED ORDER — LORAZEPAM 2 MG/ML IJ SOLN
1.0000 mg | Freq: Once | INTRAMUSCULAR | Status: AC
  Administered 2015-11-07: 1 mg via INTRAVENOUS
  Filled 2015-11-07: qty 1

## 2015-11-07 MED ORDER — SODIUM CHLORIDE 0.9 % IV BOLUS (SEPSIS)
500.0000 mL | Freq: Once | INTRAVENOUS | Status: AC
  Administered 2015-11-07: 500 mL via INTRAVENOUS

## 2015-11-07 NOTE — Telephone Encounter (Signed)
Pt needs to go to ER

## 2015-11-07 NOTE — Telephone Encounter (Signed)
 [facility] [place] Day - Client TELEPHONE ADVICE RECORD [facility] Patient Name: [name] DOB: 02-01-1931 Initial Comment Caller states mother c/o urinary pain Nurse Assessment Nurse: Lane Hacker, RN, Windy Date/Time (Eastern Time): 11/07/2015 9:37:05 AM Confirm and document reason for call. If symptomatic, describe symptoms. You must click the next button to save text entered. ---Caller, dtr - [name], states mother c/o urinary pain - screaming in pain today. S/S started last week. She was at hospital last Thursday d/t unable to urinate and in pain. Cathed her once, and then able to urinate on her own. Vomited once after giving an IV antibiotic. Not sent home with antibiotics. -- She is asleep and does not speak [place]. Has the patient traveled out of the country within the last 30 days? ---Not Applicable Does the patient have any new or worsening symptoms? ---Yes Will a triage be completed? ---Yes Related visit to physician within the last 2 weeks? ---Yes Does the PT have any chronic conditions? (i.e. diabetes, asthma, etc.) ---Yes List chronic conditions. ---NIDDM; HTN at hospital - and now at home - taking Metoprolol, CVA Is this a behavioral health or substance abuse call? ---No Guidelines Guideline Title Affirmed Question Affirmed Notes Urination Pain - Female Patient sounds very sick or weak to the triager Final Disposition User Go to ED Now (or PCP triage) Lane Hacker, RN, Windy Comments Home health nurse visited yesterday and did not collect any urine. Weaker, but not in shock. Not able to schedule appt today. Caller wanted to wait til tomorrow so she could get a Zenaida Niece scheduled to bring her as she is w/c bound currently and seems weaker. RN advised that she should not wait til tomorrow. She will call the home health nurse to have her come soon for urine sample. Office: please call her with MD's response. I tried to encourage her to go on  to ER as she may need cath and more antibiotics. Referrals GO TO FACILITY UNDECIDED Disagree/Comply: Comply

## 2015-11-07 NOTE — ED Notes (Addendum)
Per family. Pt was seen at [facility] for UTI last week. Pt was discharged home on Saturday. Pt has had increased rt sided weakness and facial droop since then. Pt noted to have a right sided facial droop and rt sided weakness in triage. Family states that this is worse normal from previous stroke. Pt has been altered and lethargic. Pt has had decreased urine output.

## 2015-11-07 NOTE — Telephone Encounter (Signed)
Please see other phone noted dated 11/07/15.

## 2015-11-07 NOTE — Telephone Encounter (Signed)
Caller name:[facility] Relationship to patient: Can be reached:[phone] Pharmacy:  Reason for call:Needs orders for resumption of care, requesting call back asap

## 2015-11-07 NOTE — Telephone Encounter (Signed)
Called and spoke to [place] about provider's recommendation.  [name] states that daughter is hesitant about having patient taken to hospital, because she lives in [place] [facility] and will be taken to [facility].  Daughter does not want mother to be taken [facility], they would prefer patient go to [facility].    Attempted to call daughter.  Left a message for call back.    Daughter called back while completing note and agreed to comply with calling EMS and having her transport to the hospital.

## 2015-11-07 NOTE — ED Provider Notes (Signed)
CSN: [id number]     Arrival date & time 11/07/15  1645 History   First MD Initiated Contact with Patient 11/07/15 1738     Chief Complaint  Patient presents with  . Altered Mental Status   Patient is a 82 y.o. female presenting with altered mental status. The history is provided by the patient and a relative. No language interpreter was used.  Altered Mental Status Presenting symptoms: behavior changes, confusion, lethargy and partial responsiveness   Severity:  Severe Most recent episode:  Today Episode history:  Continuous Duration:  2 weeks Timing:  Constant Progression:  Worsening Chronicity:  New Context: recent illness and recent infection   Context: not a recent change in medication   Associated symptoms: weakness   Associated symptoms: no abdominal pain, normal movement, no bladder incontinence, no depression, no fever, no nausea and no vomiting     Past Medical History  Diagnosis Date  . Diabetes mellitus   . Hypertension   . Stroke [facility]) Right Side Weakness  . CHF (congestive heart failure) (HCC)     2D ECHO, 09/13/2011 - EF 40-45%, normal  . 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 Pain in limb     BILATERAL EXTREMITY VENOUS DUPLEX, 01/08/2011 - no evidence of deep vein or superficial thrombosis or Baker's cyst   Past Surgical History  Procedure Laterality Date  . Cardiac surgery    . Cardiac catheterization  10/27/2004    3 stents placed, 3.5x28 proximally,3.5x33 mid segment, and 3.5x33 in the region of the crux, stenosis being reduced to 0% at proximal and mid segment and being reduced from 80% ot 10% in the stent at the cruxed segment  . Cardiac catheterization  09/26/2004    High grade 95% ostial stenosis in the RCA with 70% mid stenosis, 95% distal stenosis and total occlusion of the PDA with collaterals  . Coronary  artery bypass graft  02/24/1999    x4; IMA to distal LAD, left radial to first circ, SVG to first diagonal, SVG to posterior descending coronary artery   Family History  Problem Relation Age of Onset  . Cancer Mother     Lung  . Heart disease Brother   . Hypertension Brother   . Heart disease Brother   . Hypertension Brother    Social History  Substance Use Topics  . Smoking status: Never Smoker   . Smokeless tobacco: Never Used  . Alcohol Use: No   OB History    No data available     Review of Systems  Constitutional: Negative for fever, chills, activity change and appetite change.  HENT: Negative for congestion and dental problem.   Eyes: Negative for discharge, itching and visual disturbance.  Respiratory: Negative for chest tightness and shortness of breath.   Cardiovascular: Negative for chest pain and leg swelling.  Gastrointestinal: Negative for nausea, vomiting, abdominal pain and abdominal distention.  Genitourinary: Positive for difficulty urinating. Negative for bladder incontinence and frequency.  Musculoskeletal: Positive for neck pain. Negative for back pain.  Neurological: Positive for weakness.  Psychiatric/Behavioral: Positive for confusion. Negative for behavioral problems.  All other systems reviewed and are negative.   Allergies  Penicillins  Home Medications   Prior to Admission medications   Medication Sig Start Date End Date Taking? Authorizing Provider  albuterol (PROVENTIL) (2.5 MG/3ML) 0.083% nebulizer solution Take 2.5  mg by nebulization 2 (two) times daily.  10/21/15 10/20/16 Yes Historical Provider, MD  albuterol-ipratropium (COMBIVENT) 18-103 MCG/ACT inhaler Inhale 1 puff into the lungs every 4 (four) hours as needed.  10/21/15 10/20/16 Yes Historical Provider, MD  allopurinol (ZYLOPRIM) 100 MG tablet Take 100 mg by mouth daily. 03/08/14  Yes Historical Provider, MD  atorvastatin (LIPITOR) 40 MG tablet Take 1 tablet (40 mg total) by mouth  daily. Patient taking differently: Take 40 mg by mouth daily at 6 PM.  10/26/15  Yes Sheliah Hatch, MD  clopidogrel (PLAVIX) 75 MG tablet Take 1 tablet (75 mg total) by mouth daily. 09/12/15  Yes Dwana Melena, PA-C  colchicine 0.6 MG tablet Take 0.6 mg by mouth daily.    Yes Historical Provider, MD  CRANBERRY CONCENTRATE PO Take 1 tablet by mouth 2 (two) times daily.   Yes Historical Provider, MD  folic acid (FOLVITE) 1 MG tablet TAKE ONE TABLET BY MOUTH ONCE DAILY 09/27/15  Yes Lennette Bihari, MD  isosorbide mononitrate (IMDUR) 60 MG 24 hr tablet Take 1 tablet (60 mg total) by mouth daily. NEED OV. 08/16/15  Yes Lennette Bihari, MD  JANUVIA 50 MG tablet Take 50 mg by mouth daily with breakfast.  04/09/14  Yes Historical Provider, MD  metoprolol (LOPRESSOR) 50 MG tablet Take 1 tablet (50 mg total) by mouth 2 (two) times daily. 08/16/15  Yes Lennette Bihari, MD  nitroGLYCERIN (NITROSTAT) 0.4 MG SL tablet Place 1 tablet (0.4 mg total) under the tongue as needed. 08/16/15  Yes Lennette Bihari, MD  omega-3 acid ethyl esters (LOVAZA) 1 G capsule TAKE TWO CAPSULES BY MOUTH TWICE DAILY Patient taking differently: Take 1 g by mouth 2 (two) times daily.  08/16/15  Yes Lennette Bihari, MD  pantoprazole (PROTONIX) 40 MG tablet Take 1 tablet (40 mg total) by mouth daily. Make appt for refills. 08/16/15  Yes Lennette Bihari, MD  polysaccharide iron (NIFEREX) 150 MG CAPS capsule Take 150 mg by mouth 2 (two) times daily.    Yes Historical Provider, MD  primidone (MYSOLINE) 50 MG tablet Take 50 mg by mouth at bedtime.    Yes Historical Provider, MD  senna (SENOKOT) 8.6 MG tablet Take 1 tablet by mouth 2 (two) times daily.    Yes Historical Provider, MD  torsemide (DEMADEX) 20 MG tablet Take 2 tablets (40 mg total) by mouth daily. Patient taking differently: Take 20 mg by mouth 2 (two) times daily.  11/01/15  Yes Luke K Kilroy, PA-C   BP 154/57 mmHg  Pulse 76  Temp(Src) 98.3 F (36.8 C) (Oral)  Resp 20  SpO2  100% Physical Exam  Constitutional: She appears well-developed and well-nourished. She appears listless. No distress.  HENT:  Head: Normocephalic and atraumatic.  Right Ear: External ear normal.  Left Ear: External ear normal.  Eyes: Pupils are equal, round, and reactive to light.  Neck: Normal range of motion.  Cardiovascular: Normal rate and regular rhythm.   Pulmonary/Chest: No respiratory distress. She has no wheezes. She exhibits no tenderness.  Abdominal: Soft. She exhibits no distension. There is no tenderness. There is no rebound and no guarding.  Lymphadenopathy:    She has no cervical adenopathy.  Neurological: She appears listless. No cranial nerve deficit or sensory deficit. She exhibits abnormal muscle tone.  Skin: She is not diaphoretic.  Nursing note and vitals reviewed.   ED Course  Procedures (including critical care time) Labs Review Labs Reviewed  APTT - Abnormal;  Notable for the following:    aPTT 39 (*)    All other components within normal limits  CBC - Abnormal; Notable for the following:    RBC 2.97 (*)    Hemoglobin 7.9 (*)    HCT 26.9 (*)    MCHC 29.4 (*)    RDW 18.8 (*)    Platelets 101 (*)    All other components within normal limits  COMPREHENSIVE METABOLIC PANEL - Abnormal; Notable for the following:    Sodium 131 (*)    Chloride 90 (*)    Glucose, Bld 110 (*)    BUN 45 (*)    Creatinine, Ser 1.43 (*)    Calcium 8.2 (*)    Total Protein 6.0 (*)    Albumin 2.9 (*)    AST 64 (*)    GFR calc non Af Amer 33 (*)    GFR calc Af Amer 38 (*)    All other components within normal limits  URINALYSIS, ROUTINE W REFLEX MICROSCOPIC (NOT AT [facility]) - Abnormal; Notable for the following:    APPearance HAZY (*)    Leukocytes, UA TRACE (*)    All other components within normal limits  URINE MICROSCOPIC-ADD ON - Abnormal; Notable for the following:    Squamous Epithelial / LPF 0-5 (*)    Bacteria, UA FEW (*)    Casts HYALINE CASTS (*)    All other  components within normal limits  I-STAT TROPOININ, ED - Abnormal; Notable for the following:    Troponin i, poc 0.47 (*)    All other components within normal limits  I-STAT CHEM 8, ED - Abnormal; Notable for the following:    Sodium 129 (*)    Chloride 88 (*)    BUN 47 (*)    Creatinine, Ser 1.50 (*)    Glucose, Bld 107 (*)    Calcium, Ion 1.01 (*)    Hemoglobin 10.5 (*)    HCT 31.0 (*)    All other components within normal limits  I-STAT TROPOININ, ED - Abnormal; Notable for the following:    Troponin i, poc 0.43 (*)    All other components within normal limits  I-STAT ARTERIAL BLOOD GAS, ED - Abnormal; Notable for the following:    pH, Arterial 7.312 (*)    pCO2 arterial 65.6 (*)    pO2, Arterial 58.0 (*)    Bicarbonate 33.1 (*)    Acid-Base Excess 5.0 (*)    All other components within normal limits  URINE CULTURE  PROTIME-INR  DIFFERENTIAL  BLOOD GAS, ARTERIAL  CBG MONITORING, ED  TYPE AND SCREEN    Imaging Review Ct Head Wo Contrast  11/07/2015  CLINICAL DATA:  82 year old female with right-sided weakness and facial droop. EXAM: CT HEAD WITHOUT CONTRAST TECHNIQUE: Contiguous axial images were obtained from the base of the skull through the vertex without intravenous contrast. COMPARISON:  Head CT dated 10/13/2015 an MRI dated 08/22/2015 FINDINGS: There is no acute intracranial hemorrhage. There is moderate age-related atrophy and chronic microvascular ischemic. Old left frontal infarct and encephalomalacia of with compensatory dilatation of the left lateral ventricle noted with old left basal ganglia infarct appears stable to prior study. There is no mass effect or midline shift. The visualized paranasal sinuses and mastoid air cells are well aerated. The calvarium is intact. IMPRESSION: No acute intracranial hemorrhage. Age-related atrophy and chronic microvascular ischemic disease. Stable old left frontal and basal ganglia infarcts and encephalomalacia. If symptoms persist  and there are no contraindications, MRI  may provide better evaluation if clinically indicated. Electronically Signed   By: Elgie Collard M.D.   On: 11/07/2015 19:20   Mr Brain Wo Contrast  11/07/2015  CLINICAL DATA:  Increasing RIGHT-sided weakness and facial droop after being diagnosed with urinary tract infection last week. History of stroke resulting in RIGHT-sided weakness. History of diabetes, hypertension. EXAM: MRI HEAD WITHOUT CONTRAST TECHNIQUE: Multiplanar, multiecho pulse sequences of the brain and surrounding structures were obtained without intravenous contrast. COMPARISON:  CT head November 07, 2015 at 1846 hours and MRI brain August 22, 2015 FINDINGS: No reduced diffusion to suggest acute ischemia. Gradient sequence is moderately motion degraded, susceptibility artifact in LEFT cerebellum seen on prior MRI is not apparent on this motion degraded examination. Old LEFT basal ganglia/corona radiata infarct with ex vacuo dilatation of LEFT lateral ventricle. Asymmetrically smaller LEFT cerebral peduncle compatible with wallerian degeneration. No hydrocephalus. No midline shift, mass effect or mass lesions. Patchy supratentorial white matter T2 hyperintensities exclusive of the aforementioned abnormality. No abnormal extra-axial fluid collections. Normal intracranial vascular flow voids present at skull base. Status post bilateral ocular lens implants. Imaged paranasal sinuses are well-aerated. Small bilateral mastoid effusions. No abnormal sellar expansion. No cerebellar tonsillar ectopia. No suspicious calvarial bone marrow signal. Patient appears edentulous. IMPRESSION: No acute intracranial process, specifically no acute ischemia on this motion degraded examination. Old LEFT corona radiata/ basal ganglia infarct. Mild to moderate chronic small vessel ischemic disease. Electronically Signed   By: Awilda Metro M.D.   On: 11/07/2015 23:51   I have personally reviewed and evaluated these  images and lab results as part of my medical decision-making.   EKG Interpretation None      MDM   Final diagnoses:  Somnolence    Patient is a 82 year old woman with history of hypertension, diabetes, past stroke with residual right-sided weakness who presents to the emergency department for continued altered mental status for the past 2 weeks. Patient was previously treated at [facility] for urinary tract infection. She has declined since discharge.  Upon arrival BP stable. She is arousable to verbal stimulation but falls asleep quickly. Right extremity weakness. Heart lung and abdominal exam unremarkable.  Differential diagnosis includes CVA versus bleed versus acute kidney injury versus anemia. No hypotension or fever to suggest sepsis.  Troponin elevated but repeat stable. Patient without chest pain. UA with trace leukocytes but negative nitrates. Sodium low at 129. Creatinine with mild bump to 1.50.  Incompletely clear cause of altered mental status the patient still not at her baseline.   Patient given 500 mL normal saline bolus while in the emergency department and was admitted to hospitalist service for further evaluation and treatment of altered mental status. Admitting team requested MR brain which was performed while in the emergency department. She was given 1 mg Ativan to facilitate this test.  Patient reached the floor in stable condition with no further emergency department events. Results of MR not back at time of transfer of care.  Discussed case with my attending Dr. Lynelle Doctor.    Dan Humphreys, MD 11/08/15 Rich Fuchs  Linwood Dibbles, MD 11/08/15 [phone]

## 2015-11-07 NOTE — Telephone Encounter (Signed)
She says she needs to take her mother to the hospital. She wants to know which one should she take her?

## 2015-11-07 NOTE — Telephone Encounter (Signed)
These symptoms, coupled w/ the screaming in pain reported in the [facility] phone call, warrant an ER visit.

## 2015-11-07 NOTE — Telephone Encounter (Signed)
Message routed to PCP.

## 2015-11-07 NOTE — Telephone Encounter (Signed)
Daughter called back twice stating that mother is refusing to go to the ER.  Daughter requesting an order for a urine test.  Spoke to Dr. Beverely Low who states that patient really needs to go to ER.  Explained to daughter provider's concerns and recommendation to go to ER.  Daughter stated understanding and says that she would call mother and sister back and tell them the same.

## 2015-11-07 NOTE — Telephone Encounter (Signed)
Spoke to Aneth RN Patient went to hospital 11/03/15 and discharge 11/05/15  BMP and CBC DONE AT THAT TIME Patient maybe admitted to hospital today by primary - due to urination issues  [facility]  RN wanted to know if BNP is still needed. Will discuss with Corine Shelter PA

## 2015-11-07 NOTE — Telephone Encounter (Signed)
Spoke to PATTY RN PATIENT STILL NEEDS  BNP ,CBC IWHEN PATIENT RETURNS HOME, IF NOT ADMITTED  PATTY IS  AWARE

## 2015-11-07 NOTE — Telephone Encounter (Signed)
Please call asap

## 2015-11-07 NOTE — ED Notes (Signed)
Patient transported to CT 

## 2015-11-07 NOTE — Telephone Encounter (Signed)
[facility] would like order to continue services. States patient has not had BM in 6 days. States family gave [place] which they had at home. States patient has been out of [place] for 3 days. FBS yesterday was 125. States patient has possible UT states patient has complained of abdominal pain.  I would like order for I and O catheter . States patient was treated in hospital for UTI was not discharged on Levaquin. Patient not eating or drinking much. Has appointment on 20th but will try to get patient in soofn

## 2015-11-07 NOTE — Telephone Encounter (Signed)
Daughter called in stating that patient was discharged from the hospital yesterday (2/12) but is experiencing pain and inability to urinate. Transferred to [facility]. Spoke with Amy

## 2015-11-07 NOTE — ED Notes (Signed)
2 RN's attempted IV, unsuccessful. IV team consult ordered.

## 2015-11-07 NOTE — Telephone Encounter (Signed)
Do BNP, and CBC

## 2015-11-07 NOTE — Telephone Encounter (Signed)
Pt dtr called to notify that she took patient to [facility] ER

## 2015-11-07 NOTE — Telephone Encounter (Signed)
Pt going to [name] for eval of anuria.

## 2015-11-08 ENCOUNTER — Encounter (HOSPITAL_COMMUNITY): Payer: Self-pay | Admitting: Internal Medicine

## 2015-11-08 ENCOUNTER — Inpatient Hospital Stay (HOSPITAL_COMMUNITY): Payer: Medicare Other

## 2015-11-08 ENCOUNTER — Telehealth: Payer: Self-pay | Admitting: Cardiovascular Disease

## 2015-11-08 ENCOUNTER — Ambulatory Visit (HOSPITAL_COMMUNITY): Payer: Medicare Other

## 2015-11-08 DIAGNOSIS — J9602 Acute respiratory failure with hypercapnia: Secondary | ICD-10-CM | POA: Diagnosis present

## 2015-11-08 DIAGNOSIS — G9340 Encephalopathy, unspecified: Secondary | ICD-10-CM

## 2015-11-08 DIAGNOSIS — R400 Somnolence: Secondary | ICD-10-CM | POA: Insufficient documentation

## 2015-11-08 DIAGNOSIS — E1159 Type 2 diabetes mellitus with other circulatory complications: Secondary | ICD-10-CM | POA: Diagnosis present

## 2015-11-08 LAB — BLOOD GAS, ARTERIAL
Acid-Base Excess: 6.2 mmol/L — ABNORMAL HIGH (ref 0.0–2.0)
Acid-Base Excess: 6 mmol/L — ABNORMAL HIGH (ref 0.0–2.0)
Acid-Base Excess: 6.6 mmol/L — ABNORMAL HIGH (ref 0.0–2.0)
Bicarbonate: 33 mEq/L — ABNORMAL HIGH (ref 20.0–24.0)
Bicarbonate: 31.6 mEq/L — ABNORMAL HIGH (ref 20.0–24.0)
Bicarbonate: 32.5 mEq/L — ABNORMAL HIGH (ref 20.0–24.0)
DELIVERY SYSTEMS: POSITIVE
DRAWN BY: 10006
DRAWN BY: 441261
DRAWN BY: 244801
Delivery systems: POSITIVE
Delivery systems: POSITIVE
Expiratory PAP: 5
Expiratory PAP: 5
Expiratory PAP: 5
FIO2: 0.3
FIO2: 0.3
FIO2: 0.3
INSPIRATORY PAP: 16
INSPIRATORY PAP: 20
Inspiratory PAP: 20
Mode: POSITIVE
O2 SAT: 96.1 %
O2 SAT: 96.9 %
O2 Saturation: 98.5 %
PATIENT TEMPERATURE: 98.6
PATIENT TEMPERATURE: 98.6
PATIENT TEMPERATURE: 98.6
PCO2 ART: 76.1 mmHg — AB (ref 35.0–45.0)
PCO2 ART: 61 mmHg — AB (ref 35.0–45.0)
PCO2 ART: 65 mmHg — AB (ref 35.0–45.0)
PO2 ART: 87.2 mmHg (ref 80.0–100.0)
PO2 ART: 107 mmHg — AB (ref 80.0–100.0)
PO2 ART: 88.8 mmHg (ref 80.0–100.0)
RATE: 10 resp/min
RATE: 10 resp/min
TCO2: 35.3 mmol/L (ref 0–100)
TCO2: 33.4 mmol/L (ref 0–100)
TCO2: 34.5 mmol/L (ref 0–100)
pH, Arterial: 7.26 — ABNORMAL LOW (ref 7.350–7.450)
pH, Arterial: 7.334 — ABNORMAL LOW (ref 7.350–7.450)
pH, Arterial: 7.32 — ABNORMAL LOW (ref 7.350–7.450)

## 2015-11-08 LAB — RAPID URINE DRUG SCREEN, HOSP PERFORMED
AMPHETAMINES: NOT DETECTED
BARBITURATES: POSITIVE — AB
Benzodiazepines: NOT DETECTED
Cocaine: NOT DETECTED
OPIATES: POSITIVE — AB
TETRAHYDROCANNABINOL: NOT DETECTED

## 2015-11-08 LAB — PROCALCITONIN
Procalcitonin: 0.39 ng/mL
Procalcitonin: 0.37 ng/mL

## 2015-11-08 LAB — CBC WITH DIFFERENTIAL/PLATELET
BASOS ABS: 0 10*3/uL (ref 0.0–0.1)
BASOS PCT: 0 %
EOS PCT: 1 %
Eosinophils Absolute: 0 10*3/uL (ref 0.0–0.7)
HEMATOCRIT: 28.2 % — AB (ref 36.0–46.0)
Hemoglobin: 8.5 g/dL — ABNORMAL LOW (ref 12.0–15.0)
LYMPHS PCT: 11 %
Lymphs Abs: 0.9 10*3/uL (ref 0.7–4.0)
MCH: 27.6 pg (ref 26.0–34.0)
MCHC: 30.1 g/dL (ref 30.0–36.0)
MCV: 91.6 fL (ref 78.0–100.0)
MONO ABS: 1 10*3/uL (ref 0.1–1.0)
MONOS PCT: 12 %
NEUTROS ABS: 6 10*3/uL (ref 1.7–7.7)
Neutrophils Relative %: 76 %
PLATELETS: 97 10*3/uL — AB (ref 150–400)
RBC: 3.08 MIL/uL — ABNORMAL LOW (ref 3.87–5.11)
RDW: 18.8 % — AB (ref 11.5–15.5)
WBC: 7.8 10*3/uL (ref 4.0–10.5)

## 2015-11-08 LAB — POCT I-STAT 3, ART BLOOD GAS (G3+)
ACID-BASE EXCESS: 6 mmol/L — AB (ref 0.0–2.0)
BICARBONATE: 32.6 meq/L — AB (ref 20.0–24.0)
O2 SAT: 96 %
TCO2: 34 mmol/L (ref 0–100)
pCO2 arterial: 53.7 mmHg — ABNORMAL HIGH (ref 35.0–45.0)
pH, Arterial: 7.389 (ref 7.350–7.450)
pO2, Arterial: 84 mmHg (ref 80.0–100.0)

## 2015-11-08 LAB — MRSA PCR SCREENING: MRSA by PCR: NEGATIVE

## 2015-11-08 LAB — GLUCOSE, CAPILLARY
GLUCOSE-CAPILLARY: 102 mg/dL — AB (ref 65–99)
GLUCOSE-CAPILLARY: 76 mg/dL (ref 65–99)

## 2015-11-08 LAB — COMPREHENSIVE METABOLIC PANEL
ALBUMIN: 3.1 g/dL — AB (ref 3.5–5.0)
ALK PHOS: 131 U/L — AB (ref 38–126)
ALT: 29 U/L (ref 14–54)
AST: 64 U/L — AB (ref 15–41)
Anion gap: 11 (ref 5–15)
BILIRUBIN TOTAL: 0.9 mg/dL (ref 0.3–1.2)
BUN: 45 mg/dL — AB (ref 6–20)
CALCIUM: 8.4 mg/dL — AB (ref 8.9–10.3)
CO2: 31 mmol/L (ref 22–32)
Chloride: 89 mmol/L — ABNORMAL LOW (ref 101–111)
Creatinine, Ser: 1.48 mg/dL — ABNORMAL HIGH (ref 0.44–1.00)
GFR calc Af Amer: 37 mL/min — ABNORMAL LOW (ref >60)
GFR calc non Af Amer: 32 mL/min — ABNORMAL LOW (ref >60)
GLUCOSE: 93 mg/dL (ref 65–99)
Potassium: 4.7 mmol/L (ref 3.5–5.1)
SODIUM: 131 mmol/L — AB (ref 135–145)
TOTAL PROTEIN: 6.2 g/dL — AB (ref 6.5–8.1)

## 2015-11-08 LAB — I-STAT ARTERIAL BLOOD GAS, ED
Acid-Base Excess: 5 mmol/L — ABNORMAL HIGH (ref 0.0–2.0)
BICARBONATE: 33.1 meq/L — AB (ref 20.0–24.0)
O2 SAT: 86 %
PCO2 ART: 65.6 mmHg — AB (ref 35.0–45.0)
PH ART: 7.312 — AB (ref 7.350–7.450)
PO2 ART: 58 mmHg — AB (ref 80.0–100.0)
Patient temperature: 98.6
TCO2: 35 mmol/L (ref 0–100)

## 2015-11-08 LAB — TROPONIN I
Troponin I: 0.66 ng/mL (ref <0.031)
Troponin I: 0.54 ng/mL (ref <0.031)

## 2015-11-08 LAB — LACTIC ACID, PLASMA: Lactic Acid, Venous: 1.4 mmol/L (ref 0.5–2.0)

## 2015-11-08 LAB — TSH: TSH: 1.096 u[IU]/mL (ref 0.350–4.500)

## 2015-11-08 LAB — AMMONIA: Ammonia: 59 umol/L — ABNORMAL HIGH (ref 9–35)

## 2015-11-08 LAB — SALICYLATE LEVEL

## 2015-11-08 LAB — BRAIN NATRIURETIC PEPTIDE: B Natriuretic Peptide: 2328.7 pg/mL — ABNORMAL HIGH (ref 0.0–100.0)

## 2015-11-08 LAB — ACETAMINOPHEN LEVEL: Acetaminophen (Tylenol), Serum: <10 ug/mL — ABNORMAL LOW (ref 10–30)

## 2015-11-08 MED ORDER — ONDANSETRON HCL 4 MG/2ML IJ SOLN
4.0000 mg | Freq: Four times a day (QID) | INTRAMUSCULAR | Status: DC | PRN
  Administered 2015-11-13: 4 mg via INTRAVENOUS
  Filled 2015-11-08: qty 2

## 2015-11-08 MED ORDER — SODIUM CHLORIDE 0.9% FLUSH
10.0000 mL | INTRAVENOUS | Status: DC | PRN
  Administered 2015-11-20: 10 mL
  Filled 2015-11-08: qty 40

## 2015-11-08 MED ORDER — INSULIN ASPART 100 UNIT/ML ~~LOC~~ SOLN
0.0000 [IU] | SUBCUTANEOUS | Status: DC
  Administered 2015-11-09 (×3): 1 [IU] via SUBCUTANEOUS
  Administered 2015-11-10 – 2015-11-11 (×6): 2 [IU] via SUBCUTANEOUS
  Administered 2015-11-11 – 2015-11-12 (×2): 1 [IU] via SUBCUTANEOUS
  Administered 2015-11-12 (×2): 2 [IU] via SUBCUTANEOUS
  Administered 2015-11-12: 1 [IU] via SUBCUTANEOUS
  Administered 2015-11-12 – 2015-11-16 (×21): 2 [IU] via SUBCUTANEOUS
  Administered 2015-11-16 – 2015-11-20 (×16): 1 [IU] via SUBCUTANEOUS
  Administered 2015-11-20 (×2): 2 [IU] via SUBCUTANEOUS
  Administered 2015-11-20: 1 [IU] via SUBCUTANEOUS
  Administered 2015-11-21: 3 [IU] via SUBCUTANEOUS
  Administered 2015-11-21 (×2): 2 [IU] via SUBCUTANEOUS
  Administered 2015-11-21: 1 [IU] via SUBCUTANEOUS
  Administered 2015-11-21 – 2015-11-23 (×11): 2 [IU] via SUBCUTANEOUS
  Administered 2015-11-23: 3 [IU] via SUBCUTANEOUS
  Administered 2015-11-23: 2 [IU] via SUBCUTANEOUS
  Administered 2015-11-23: 1 [IU] via SUBCUTANEOUS
  Administered 2015-11-24 (×2): 2 [IU] via SUBCUTANEOUS
  Administered 2015-11-24 (×3): 1 [IU] via SUBCUTANEOUS
  Administered 2015-11-25 (×3): 2 [IU] via SUBCUTANEOUS
  Administered 2015-11-25: 1 [IU] via SUBCUTANEOUS

## 2015-11-08 MED ORDER — VANCOMYCIN HCL 10 G IV SOLR
1750.0000 mg | Freq: Once | INTRAVENOUS | Status: AC
  Administered 2015-11-08: 1750 mg via INTRAVENOUS
  Filled 2015-11-08: qty 1750

## 2015-11-08 MED ORDER — IPRATROPIUM BROMIDE 0.02 % IN SOLN: 0.5000 mg | RESPIRATORY_TRACT | Status: DC

## 2015-11-08 MED ORDER — ALBUTEROL SULFATE (2.5 MG/3ML) 0.083% IN NEBU: 2.5000 mg | INHALATION_SOLUTION | RESPIRATORY_TRACT | Status: DC

## 2015-11-08 MED ORDER — ALBUTEROL SULFATE (2.5 MG/3ML) 0.083% IN NEBU: 2.5000 mg | INHALATION_SOLUTION | RESPIRATORY_TRACT | Status: DC | PRN

## 2015-11-08 MED ORDER — ASPIRIN 325 MG PO TABS
325.0000 mg | ORAL_TABLET | Freq: Every day | ORAL | Status: DC
  Administered 2015-11-08 – 2015-11-13 (×6): 325 mg
  Filled 2015-11-08 (×6): qty 1

## 2015-11-08 MED ORDER — LACTULOSE ENEMA
300.0000 mL | Freq: Every day | ORAL | Status: DC
  Administered 2015-11-08: 300 mL via RECTAL
  Filled 2015-11-08: qty 300

## 2015-11-08 MED ORDER — ETOMIDATE 2 MG/ML IV SOLN
20.0000 mg | Freq: Once | INTRAVENOUS | Status: AC
  Administered 2015-11-08: 20 mg via INTRAVENOUS

## 2015-11-08 MED ORDER — SODIUM CHLORIDE 0.9% FLUSH
3.0000 mL | Freq: Two times a day (BID) | INTRAVENOUS | Status: DC
  Administered 2015-11-08 – 2015-11-21 (×14): 3 mL via INTRAVENOUS
  Administered 2015-11-22: 12:00:00 via INTRAVENOUS
  Administered 2015-11-23: 3 mL via INTRAVENOUS

## 2015-11-08 MED ORDER — ACETAZOLAMIDE SODIUM 500 MG IJ SOLR
250.0000 mg | Freq: Four times a day (QID) | INTRAMUSCULAR | Status: AC
  Administered 2015-11-08 – 2015-11-09 (×3): 250 mg via INTRAVENOUS
  Filled 2015-11-08 (×3): qty 500

## 2015-11-08 MED ORDER — DEXMEDETOMIDINE HCL IN NACL 400 MCG/100ML IV SOLN
0.4000 ug/kg/h | INTRAVENOUS | Status: DC
  Administered 2015-11-08: 0.4 ug/kg/h via INTRAVENOUS
  Administered 2015-11-08: 0.5 ug/kg/h via INTRAVENOUS
  Administered 2015-11-09: 0.8 ug/kg/h via INTRAVENOUS
  Administered 2015-11-09: 0.5 ug/kg/h via INTRAVENOUS
  Administered 2015-11-10 – 2015-11-11 (×2): 0.3 ug/kg/h via INTRAVENOUS
  Administered 2015-11-11 – 2015-11-12 (×2): 0.4 ug/kg/h via INTRAVENOUS
  Filled 2015-11-08 (×4): qty 100
  Filled 2015-11-08: qty 50
  Filled 2015-11-08 (×2): qty 100
  Filled 2015-11-08: qty 50
  Filled 2015-11-08: qty 100

## 2015-11-08 MED ORDER — ALBUTEROL SULFATE (2.5 MG/3ML) 0.083% IN NEBU
2.5000 mg | INHALATION_SOLUTION | Freq: Two times a day (BID) | RESPIRATORY_TRACT | Status: DC
  Administered 2015-11-08 – 2015-11-19 (×23): 2.5 mg via RESPIRATORY_TRACT
  Filled 2015-11-08 (×23): qty 3

## 2015-11-08 MED ORDER — DOXYCYCLINE HYCLATE 100 MG IV SOLR
100.0000 mg | Freq: Two times a day (BID) | INTRAVENOUS | Status: DC
  Administered 2015-11-08 (×2): 100 mg via INTRAVENOUS
  Filled 2015-11-08 (×3): qty 100

## 2015-11-08 MED ORDER — FENTANYL CITRATE (PF) 100 MCG/2ML IJ SOLN
25.0000 ug | INTRAMUSCULAR | Status: DC | PRN
  Administered 2015-11-08 – 2015-11-10 (×3): 50 ug via INTRAVENOUS
  Administered 2015-11-11: 25 ug via INTRAVENOUS
  Administered 2015-11-12: 50 ug via INTRAVENOUS
  Administered 2015-11-12: 25 ug via INTRAVENOUS
  Administered 2015-11-12 – 2015-11-13 (×8): 50 ug via INTRAVENOUS
  Filled 2015-11-08 (×16): qty 2

## 2015-11-08 MED ORDER — SODIUM CHLORIDE 0.9% FLUSH
10.0000 mL | Freq: Two times a day (BID) | INTRAVENOUS | Status: DC
Start: 2015-11-08 — End: 2015-11-25
  Administered 2015-11-08 – 2015-11-09 (×3): 10 mL
  Administered 2015-11-10: 20 mL
  Administered 2015-11-10: 10 mL
  Administered 2015-11-11: 20 mL
  Administered 2015-11-11: 10 mL
  Administered 2015-11-12: 30 mL
  Administered 2015-11-12: 10 mL
  Administered 2015-11-13: 20 mL
  Administered 2015-11-14 – 2015-11-17 (×7): 10 mL
  Administered 2015-11-17: 30 mL
  Administered 2015-11-18 – 2015-11-22 (×9): 10 mL
  Administered 2015-11-23 – 2015-11-24 (×3): 20 mL
  Administered 2015-11-25 (×2): 10 mL

## 2015-11-08 MED ORDER — ACETAMINOPHEN 650 MG RE SUPP: 650.0000 mg | Freq: Four times a day (QID) | RECTAL | Status: DC | PRN

## 2015-11-08 MED ORDER — SODIUM CHLORIDE 0.9 % IV SOLN
INTRAVENOUS | Status: DC | PRN
  Administered 2015-11-08: 18:00:00 via INTRAVENOUS
  Administered 2015-11-11: 250 mL via INTRAVENOUS
  Administered 2015-11-13: 10 mL via INTRAVENOUS
  Administered 2015-11-14: 500 mL via INTRAVENOUS

## 2015-11-08 MED ORDER — HYDRALAZINE HCL 20 MG/ML IJ SOLN
10.0000 mg | INTRAMUSCULAR | Status: DC | PRN
Start: 2015-11-08 — End: 2015-11-25
  Administered 2015-11-21: 10 mg via INTRAVENOUS
  Filled 2015-11-08: qty 1

## 2015-11-08 MED ORDER — ACETAMINOPHEN 325 MG PO TABS
650.0000 mg | ORAL_TABLET | Freq: Four times a day (QID) | ORAL | Status: DC | PRN
Start: 2015-11-08 — End: 2015-11-14
  Administered 2015-11-13 (×2): 650 mg via ORAL
  Filled 2015-11-08 (×2): qty 2

## 2015-11-08 MED ORDER — ENOXAPARIN SODIUM 40 MG/0.4ML ~~LOC~~ SOLN
40.0000 mg | Freq: Every day | SUBCUTANEOUS | Status: DC
  Administered 2015-11-08: 40 mg via SUBCUTANEOUS
  Filled 2015-11-08: qty 0.4

## 2015-11-08 MED ORDER — FUROSEMIDE 10 MG/ML IJ SOLN
60.0000 mg | Freq: Two times a day (BID) | INTRAMUSCULAR | Status: DC
  Administered 2015-11-08 (×2): 60 mg via INTRAVENOUS
  Filled 2015-11-08 (×2): qty 6

## 2015-11-08 MED ORDER — ROCURONIUM BROMIDE 50 MG/5ML IV SOLN
50.0000 mg | Freq: Once | INTRAVENOUS | Status: AC
  Administered 2015-11-08: 50 mg via INTRAVENOUS

## 2015-11-08 MED ORDER — DEXTROSE 5 % IV SOLN
1.0000 g | Freq: Three times a day (TID) | INTRAVENOUS | Status: AC
  Administered 2015-11-08 – 2015-11-15 (×23): 1 g via INTRAVENOUS
  Filled 2015-11-08 (×26): qty 1

## 2015-11-08 MED ORDER — CHLORHEXIDINE GLUCONATE 0.12% ORAL RINSE (MEDLINE KIT)
15.0000 mL | Freq: Two times a day (BID) | OROMUCOSAL | Status: DC
  Administered 2015-11-08 – 2015-11-16 (×16): 15 mL via OROMUCOSAL

## 2015-11-08 MED ORDER — FENTANYL CITRATE (PF) 100 MCG/2ML IJ SOLN
INTRAMUSCULAR | Status: AC
  Administered 2015-11-08: 100 ug
  Filled 2015-11-08: qty 2

## 2015-11-08 MED ORDER — ASPIRIN 300 MG RE SUPP
300.0000 mg | Freq: Every day | RECTAL | Status: DC
  Administered 2015-11-08: 300 mg via RECTAL
  Filled 2015-11-08: qty 1

## 2015-11-08 MED ORDER — FUROSEMIDE 10 MG/ML IJ SOLN
40.0000 mg | Freq: Two times a day (BID) | INTRAMUSCULAR | Status: DC
  Administered 2015-11-08 – 2015-11-09 (×2): 40 mg via INTRAVENOUS
  Filled 2015-11-08 (×2): qty 4

## 2015-11-08 MED ORDER — IPRATROPIUM-ALBUTEROL 0.5-2.5 (3) MG/3ML IN SOLN
3.0000 mL | RESPIRATORY_TRACT | Status: DC | PRN
  Administered 2015-11-12 – 2015-11-16 (×2): 3 mL via RESPIRATORY_TRACT
  Filled 2015-11-08 (×2): qty 3

## 2015-11-08 MED ORDER — LACTULOSE 10 GM/15ML PO SOLN
20.0000 g | Freq: Two times a day (BID) | ORAL | Status: DC
  Administered 2015-11-08: 20 g via ORAL
  Filled 2015-11-08 (×2): qty 30

## 2015-11-08 MED ORDER — VANCOMYCIN HCL 10 G IV SOLR
1250.0000 mg | INTRAVENOUS | Status: DC
  Administered 2015-11-09 – 2015-11-10 (×2): 1250 mg via INTRAVENOUS
  Filled 2015-11-08 (×2): qty 1250

## 2015-11-08 MED ORDER — ONDANSETRON HCL 4 MG PO TABS: 4.0000 mg | ORAL_TABLET | Freq: Four times a day (QID) | ORAL | Status: DC | PRN

## 2015-11-08 MED ORDER — ANTISEPTIC ORAL RINSE SOLUTION (CORINZ)
7.0000 mL | Freq: Four times a day (QID) | OROMUCOSAL | Status: DC
  Administered 2015-11-09 – 2015-11-14 (×22): 7 mL via OROMUCOSAL

## 2015-11-08 MED ORDER — MIDAZOLAM HCL 2 MG/2ML IJ SOLN
INTRAMUSCULAR | Status: AC
  Administered 2015-11-08: 2 mg
  Filled 2015-11-08: qty 2

## 2015-11-08 NOTE — H&P (Addendum)
[facility] History and Physical  ESTER HILLEY NFA:[id number] DOB: [birth year] DOA: 11/07/2015  Referring physician: Dr.Irick. PCP: [name], MD  Specialists: [facility].  Chief Complaint: Altered mental status.  History obtained from patient's daughter.  HPI: KANIA REGNIER is a 82 y.o. female with history of diabetes mellitus type 2, previous stroke, hypertension, chronic anemia and chronic kidney disease was brought to the ER after patient was found to be in altered mental status. Patient was recently admitted to the hospital at [facility] last week for abdominal pain and at that time patient was diagnosed with UTI and discharged home after being treated on Levaquin. Since discharge 2 days ago patient has been very somnolent. Denies having taken any pain medications or sedatives. On exam patient is minimally responsive. Moves all extremities. MRI of the brain is negative for anything acute. ABG shows hypercapnia and patient has been ordered to be placed on BiPAP by me.   Review of Systems: As presented in the history of presenting illness, rest negative.  Past Medical History  Diagnosis Date  . Diabetes mellitus   . Hypertension   . Stroke [facility]) Right Side Weakness  . CHF (congestive heart failure) (HCC)     2D ECHO, 09/13/2011 - EF 40-45%, normal  . 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 Pain in limb     BILATERAL EXTREMITY VENOUS DUPLEX, 01/08/2011 - no evidence of deep vein or superficial thrombosis or Baker's cyst   Past Surgical History  Procedure Laterality Date  . Cardiac surgery    . Cardiac catheterization  10/27/2004    3 stents placed, 3.5x28 proximally,3.5x33 mid segment, and 3.5x33 in the region of the crux, stenosis being reduced to 0% at proximal and mid segment and  being reduced from 80% ot 10% in the stent at the cruxed segment  . Cardiac catheterization  09/26/2004    High grade 95% ostial stenosis in the RCA with 70% mid stenosis, 95% distal stenosis and total occlusion of the PDA with collaterals  . Coronary artery bypass graft  02/24/1999    x4; IMA to distal LAD, left radial to first circ, SVG to first diagonal, SVG to posterior descending coronary artery   Social History:  reports that she has never smoked. She has never used smokeless tobacco. She reports that she does not drink alcohol or use illicit drugs. Where does patient live home. Can patient participate in ADLs? Yes.  Allergies  Allergen Reactions  . Penicillins Anaphylaxis, Itching and Swelling    Has patient had a PCN reaction causing immediate rash, facial/tongue/throat swelling, SOB or lightheadedness with hypotension: Yes Has patient had a PCN reaction causing severe rash involving mucus membranes or skin necrosis: No  Has patient had a PCN reaction that required hospitalization: Unknown Has patient had a PCN reaction occurring within the last 10 years: No  If all of the above answers are "NO", then may proceed with Cephalosporin use.     Family History:  Family History  Problem Relation Age of Onset  . Cancer Mother     Lung  . Heart disease Brother   . Hypertension Brother   . Heart disease Brother   . Hypertension Brother       Prior to Admission medications   Medication Sig Start Date End Date Taking? Authorizing Provider  albuterol (PROVENTIL) (2.5 MG/3ML) 0.083% nebulizer solution Take 2.5 mg by nebulization 2 (two) times daily.  10/21/15 10/20/16 Yes Historical Provider, MD  albuterol-ipratropium (COMBIVENT) 18-103 MCG/ACT inhaler Inhale 1 puff into the lungs every 4 (four) hours as needed.  10/21/15 10/20/16 Yes Historical Provider, MD  allopurinol (ZYLOPRIM) 100 MG tablet Take 100 mg by mouth daily. 03/08/14  Yes Historical Provider, MD  atorvastatin (LIPITOR) 40 MG  tablet Take 1 tablet (40 mg total) by mouth daily. Patient taking differently: Take 40 mg by mouth daily at 6 PM.  10/26/15  Yes Sheliah Hatch, MD  clopidogrel (PLAVIX) 75 MG tablet Take 1 tablet (75 mg total) by mouth daily. 09/12/15  Yes Dwana Melena, PA-C  colchicine 0.6 MG tablet Take 0.6 mg by mouth daily.    Yes Historical Provider, MD  CRANBERRY CONCENTRATE PO Take 1 tablet by mouth 2 (two) times daily.   Yes Historical Provider, MD  folic acid (FOLVITE) 1 MG tablet TAKE ONE TABLET BY MOUTH ONCE DAILY 09/27/15  Yes Lennette Bihari, MD  isosorbide mononitrate (IMDUR) 60 MG 24 hr tablet Take 1 tablet (60 mg total) by mouth daily. NEED OV. 08/16/15  Yes Lennette Bihari, MD  JANUVIA 50 MG tablet Take 50 mg by mouth daily with breakfast.  04/09/14  Yes Historical Provider, MD  metoprolol (LOPRESSOR) 50 MG tablet Take 1 tablet (50 mg total) by mouth 2 (two) times daily. 08/16/15  Yes Lennette Bihari, MD  nitroGLYCERIN (NITROSTAT) 0.4 MG SL tablet Place 1 tablet (0.4 mg total) under the tongue as needed. 08/16/15  Yes Lennette Bihari, MD  omega-3 acid ethyl esters (LOVAZA) 1 G capsule TAKE TWO CAPSULES BY MOUTH TWICE DAILY Patient taking differently: Take 1 g by mouth 2 (two) times daily.  08/16/15  Yes Lennette Bihari, MD  pantoprazole (PROTONIX) 40 MG tablet Take 1 tablet (40 mg total) by mouth daily. Make appt for refills. 08/16/15  Yes Lennette Bihari, MD  polysaccharide iron (NIFEREX) 150 MG CAPS capsule Take 150 mg by mouth 2 (two) times daily.    Yes Historical Provider, MD  primidone (MYSOLINE) 50 MG tablet Take 50 mg by mouth at bedtime.    Yes Historical Provider, MD  senna (SENOKOT) 8.6 MG tablet Take 1 tablet by mouth 2 (two) times daily.    Yes Historical Provider, MD  torsemide (DEMADEX) 20 MG tablet Take 2 tablets (40 mg total) by mouth daily. Patient taking differently: Take 20 mg by mouth 2 (two) times daily.  11/01/15  Yes Abelino Derrick, PA-C    Physical Exam: Filed Vitals:    11/07/15 2200 11/07/15 2215 11/08/15 0000 11/08/15 0103  BP: 136/60 154/57 156/63 129/47  Pulse: 76  86 86  Temp:      TempSrc:      Resp: 23 20 19 18   Height:    5\' 2"  (1.575 m)  Weight:    89.9 kg (198 lb 3.1 oz)  SpO2: 97% 100% 95% 96%     General:  Moderately built and nourished.  Eyes: Anicteric no pallor.  ENT: No discharge from the ears eyes nose and mouth.  Neck: No mass felt. No neck rigidity.  Cardiovascular: S1-S2 heard.  Respiratory: No rhonchi or crepitations.  Abdomen: Soft nontender bowel sounds present.  Skin: No rash.  Musculoskeletal: No edema.  Psychiatric: Patient is encephalopathic.  Neurologic: Patient is a consult to evaluate but moves all extremities.  Labs on Admission:  Basic Metabolic Panel:  Recent Labs Lab 11/07/15 1747 11/07/15 1750  NA 131* 129*  K 4.9 4.8  CL 90* 88*  CO2 29  --   GLUCOSE 110* 107*  BUN 45* 47*  CREATININE 1.43* 1.50*  CALCIUM 8.2*  --    Liver Function Tests:  Recent Labs Lab 11/07/15 1747  AST 64*  ALT 28  ALKPHOS 123  BILITOT 0.6  PROT 6.0*  ALBUMIN 2.9*   No results for input(s): LIPASE, AMYLASE in the last 168 hours. No results for input(s): AMMONIA in the last 168 hours. CBC:  Recent Labs Lab 11/07/15 1747 11/07/15 1750  WBC 7.9  --   NEUTROABS 5.9  --   HGB 7.9* 10.5*  HCT 26.9* 31.0*  MCV 90.6  --   PLT 101*  --    Cardiac Enzymes: No results for input(s): CKTOTAL, CKMB, CKMBINDEX, TROPONINI in the last 168 hours.  BNP (last 3 results)  Recent Labs  09/02/15 0715  BNP 998.9*    ProBNP (last 3 results) No results for input(s): PROBNP in the last 8760 hours.  CBG: No results for input(s): GLUCAP in the last 168 hours.  Radiological Exams on Admission: Ct Head Wo Contrast  11/07/2015  CLINICAL DATA:  82 year old female with right-sided weakness and facial droop. EXAM: CT HEAD WITHOUT CONTRAST TECHNIQUE: Contiguous axial images were obtained from the base of the skull  through the vertex without intravenous contrast. COMPARISON:  Head CT dated 10/13/2015 an MRI dated 08/22/2015 FINDINGS: There is no acute intracranial hemorrhage. There is moderate age-related atrophy and chronic microvascular ischemic. Old left frontal infarct and encephalomalacia of with compensatory dilatation of the left lateral ventricle noted with old left basal ganglia infarct appears stable to prior study. There is no mass effect or midline shift. The visualized paranasal sinuses and mastoid air cells are well aerated. The calvarium is intact. IMPRESSION: No acute intracranial hemorrhage. Age-related atrophy and chronic microvascular ischemic disease. Stable old left frontal and basal ganglia infarcts and encephalomalacia. If symptoms persist and there are no contraindications, MRI may provide better evaluation if clinically indicated. Electronically Signed   By: Elgie Collard M.D.   On: 11/07/2015 19:20   Mr Brain Wo Contrast  11/07/2015  CLINICAL DATA:  Increasing RIGHT-sided weakness and facial droop after being diagnosed with urinary tract infection last week. History of stroke resulting in RIGHT-sided weakness. History of diabetes, hypertension. EXAM: MRI HEAD WITHOUT CONTRAST TECHNIQUE: Multiplanar, multiecho pulse sequences of the brain and surrounding structures were obtained without intravenous contrast. COMPARISON:  CT head November 07, 2015 at 1846 hours and MRI brain August 22, 2015 FINDINGS: No reduced diffusion to suggest acute ischemia. Gradient sequence is moderately motion degraded, susceptibility artifact in LEFT cerebellum seen on prior MRI is not apparent on this motion degraded examination. Old LEFT basal ganglia/corona radiata infarct with ex vacuo dilatation of LEFT lateral ventricle. Asymmetrically smaller LEFT cerebral peduncle compatible with wallerian degeneration. No hydrocephalus. No midline shift, mass effect or mass lesions. Patchy supratentorial white matter T2  hyperintensities exclusive of the aforementioned abnormality. No abnormal extra-axial fluid collections. Normal intracranial vascular flow voids present at skull base. Status post bilateral ocular lens implants. Imaged paranasal sinuses are well-aerated. Small bilateral mastoid effusions. No abnormal sellar expansion. No cerebellar tonsillar ectopia. No suspicious calvarial bone marrow signal. Patient appears edentulous. IMPRESSION: No acute intracranial process, specifically no acute ischemia on this motion degraded examination. Old LEFT corona radiata/ basal ganglia infarct. Mild to moderate chronic small vessel ischemic disease. Electronically Signed  By: Awilda Metro M.D.   On: 11/07/2015 23:51    EKG: Independently reviewed. Normal sinus rhythm. Nonspecific intraventricular conduction defect.  Assessment/Plan Principal Problem:   Acute encephalopathy Active Problems:   CAD S/P RCA PCI Feb '06   Essential hypertension   Hx of CABG-'00   Acute respiratory failure with hypercapnia (HCC)   Type 2 diabetes mellitus with vascular disease (HCC)   Acute on chronic diastolic CHF (congestive heart failure), NYHA class 1 (HCC)   1. Acute encephalopathy - cause not clear. Patient is afebrile. Patient does have hypercarbia which could be probably contributing to her symptoms. Patient has been placed on BiPAP. We'll recheck ABG after placing on BiPAP. Check ammonia levels. Check lactic acid procalcitonin levels. UA is nonspecific and I have placed patient on doxycycline for now. Check blood cultures urine cultures chest x-ray is pending. Check EEG. 2. Acute hypercarbic respiratory failure - patient on BiPAP. Avoid any sedation or pain relieving medications. Recheck ABG after 2 hours. Patient also has history of CHF for which I have placed patient on Lasix 60 iv every 12. 3. Acute on chronic CHF  - patient is on BiPAP and Lasix 60 mg IV 12 hourly. Follow chest x-ray. 4. Hypertension - when necessary  IV hydralazine for systolic blood pressure 160 since patient is nothing by mouth.  5. Diabetes mellitus type 2  - on sliding scale coverage.  6. Chronic anemia - hemoglobin F history at baseline. Will receive follow CBC. 7. Chronic kidney disease stage III - creatinine appears to be at baseline. 8. CAD status post CABG - cycle cardiac markers. Aspirin per rectum daily until patient can take orally.  Chest x-ray is pending.   DVT ProphylaxisLovenox. Code Status: Full code.  Family Communication: Discussed with patient's daughter.  Disposition Plan: Admit to inpatient.    Oronde Hallenbeck N. [facility] Pager [phone].  If 7PM-7AM, please contact night-coverage [email or web address] Password [email or web address] 11/08/2015, 1:25 AM

## 2015-11-08 NOTE — Progress Notes (Signed)
Pharmacy Antibiotic Note  [name] is a 82 y.o. female admitted on 11/07/2015 with somnolence. Pt was recently admitted to an outside facility and received a course of antibiotics for pneumonia and UTI.  Pt with AKI normal SCr 0.9 > 1.2 > 1.5, WBC ok, afebrile.   Plan: -Aztreonam 1 g IV q8h -Vancomycin 1250 mg IV x1 then 1750 mg IV q24h -Monitor renal function, cultures, duration of therapy, check VT at steady state due to renal dysfunction   Height:  (157.5 cm) Weight: 198 lb 3.1 oz (89.9 kg) IBW/kg (Calculated) : 50.1  Temp (24hrs), Avg:97.8 F (36.6 C), Min:97.4 F (36.3 C), Max:98.3 F (36.8 C)   Recent Labs Lab 11/07/15 1747 11/07/15 1750 11/08/15 0203 11/08/15 0220  WBC 7.9  --   --  7.8  CREATININE 1.43* 1.50*  --  1.48*  LATICACIDVEN  --   --  1.4  --     Estimated Creatinine Clearance: 30.5 mL/min (by C-G formula based on Cr of 1.48).    Allergies  Allergen Reactions  . Penicillins Anaphylaxis, Itching and Swelling    Has patient had a PCN reaction causing immediate rash, facial/tongue/throat swelling, SOB or lightheadedness with hypotension: Yes Has patient had a PCN reaction causing severe rash involving mucus membranes or skin necrosis: No  Has patient had a PCN reaction that required hospitalization: Unknown Has patient had a PCN reaction occurring within the last 10 years: No  If all of the above answers are "NO", then may proceed with Cephalosporin use.     Antimicrobials this admission: Vancomycin 2/14 > Aztreonam 2/14 >   Microbiology results: 2/14 blood cx: 2/14 mrsa cx:  Thank you for allowing pharmacy to be a part of this patient's care.  Baldemar Friday 11/08/2015 10:13 AM

## 2015-11-08 NOTE — Progress Notes (Addendum)
PULMONARY / CRITICAL CARE MEDICINE   Name: [name] MRN: [id number] DOB: July 29, 1931    ADMISSION DATE:  11/07/2015 CONSULTATION DATE:  11/08/15  REFERRING MD: Lars Masson, A  CHIEF COMPLAINT:  Altered mental status, hypercarbic resp failure.    SUBJECTIVE: Pt has been on BiPAP all day.  Mental status only minimally improved.  Repeat ABG x 2 with only minimal improvement in CO2 despite changes to BiPAP settings. On exam, pt hypersomnolent, minimally arouseable.   VITAL SIGNS: BP 118/63 mmHg  Pulse 86  Temp(Src) 97.8 F (36.6 C) (Axillary)  Resp 15  Ht  (1.575 m)  Wt 89.9 kg (198 lb 3.1 oz)  BMI 36.24 kg/m2  SpO2 98%  HEMODYNAMICS:    VENTILATOR SETTINGS: Vent Mode:  [-]  FiO2 (%):  [30 %] 30 %  INTAKE / OUTPUT: I/O last 3 completed shifts: In: 250 [IV Piggyback:250] Out: 100 [Urine:100]  PHYSICAL EXAMINATION: General: Elderly female, somnolent, in NAD. Neuro:  Hypersomnolent. Minimal response to noxious stimuli. HEENT: [place]/AT, MMM. Cardiovascular:  RRR. No M/R/G. Lungs:  Clear anteriorly. Abdomen: Soft, positive bowel sounds. Skin: Intact.  LABS:  BMET  Recent Labs Lab 11/07/15 1747 11/07/15 1750 11/08/15 0220  NA 131* 129* 131*  K 4.9 4.8 4.7  CL 90* 88* 89*  CO2 29  --  31  BUN 45* 47* 45*  CREATININE 1.43* 1.50* 1.48*  GLUCOSE 110* 107* 93    Electrolytes  Recent Labs Lab 11/07/15 1747 11/08/15 0220  CALCIUM 8.2* 8.4*    CBC  Recent Labs Lab 11/07/15 1747 11/07/15 1750 11/08/15 0220  WBC 7.9  --  7.8  HGB 7.9* 10.5* 8.5*  HCT 26.9* 31.0* 28.2*  PLT 101*  --  97*    Coag's  Recent Labs Lab 11/07/15 1747  APTT 39*  INR 1.05    Sepsis Markers  Recent Labs Lab 11/08/15 0203 11/08/15 0220 11/08/15 1257  LATICACIDVEN 1.4  --   --   PROCALCITON  --  0.39 0.37    ABG  Recent Labs Lab 11/08/15 0701 11/08/15 0952 11/08/15 1415  PHART 7.260* 7.334* 7.320*  PCO2ART 76.1* 61.0* 65.0*  PO2ART 87.2 107*  88.8    Liver Enzymes  Recent Labs Lab 11/07/15 1747 11/08/15 0220  AST 64* 64*  ALT 28 29  ALKPHOS 123 131*  BILITOT 0.6 0.9  ALBUMIN 2.9* 3.1*    Cardiac Enzymes  Recent Labs Lab 11/08/15 0220 11/08/15 1257  TROPONINI 0.66* 0.54*    Glucose  Recent Labs Lab 11/08/15 0311  GLUCAP 102*    Imaging Ct Head Wo Contrast  11/07/2015  CLINICAL DATA:  82 year old female with right-sided weakness and facial droop. EXAM: CT HEAD WITHOUT CONTRAST TECHNIQUE: Contiguous axial images were obtained from the base of the skull through the vertex without intravenous contrast. COMPARISON:  Head CT dated 10/13/2015 an MRI dated 08/22/2015 FINDINGS: There is no acute intracranial hemorrhage. There is moderate age-related atrophy and chronic microvascular ischemic. Old left frontal infarct and encephalomalacia of with compensatory dilatation of the left lateral ventricle noted with old left basal ganglia infarct appears stable to prior study. There is no mass effect or midline shift. The visualized paranasal sinuses and mastoid air cells are well aerated. The calvarium is intact. IMPRESSION: No acute intracranial hemorrhage. Age-related atrophy and chronic microvascular ischemic disease. Stable old left frontal and basal ganglia infarcts and encephalomalacia. If symptoms persist and there are no contraindications, MRI may provide better evaluation if clinically indicated. Electronically Signed  By: Elgie Collard M.D.   On: 11/07/2015 19:20   Mr Brain Wo Contrast  11/07/2015  CLINICAL DATA:  Increasing RIGHT-sided weakness and facial droop after being diagnosed with urinary tract infection last week. History of stroke resulting in RIGHT-sided weakness. History of diabetes, hypertension. EXAM: MRI HEAD WITHOUT CONTRAST TECHNIQUE: Multiplanar, multiecho pulse sequences of the brain and surrounding structures were obtained without intravenous contrast. COMPARISON:  CT head November 07, 2015 at 1846  hours and MRI brain August 22, 2015 FINDINGS: No reduced diffusion to suggest acute ischemia. Gradient sequence is moderately motion degraded, susceptibility artifact in LEFT cerebellum seen on prior MRI is not apparent on this motion degraded examination. Old LEFT basal ganglia/corona radiata infarct with ex vacuo dilatation of LEFT lateral ventricle. Asymmetrically smaller LEFT cerebral peduncle compatible with wallerian degeneration. No hydrocephalus. No midline shift, mass effect or mass lesions. Patchy supratentorial white matter T2 hyperintensities exclusive of the aforementioned abnormality. No abnormal extra-axial fluid collections. Normal intracranial vascular flow voids present at skull base. Status post bilateral ocular lens implants. Imaged paranasal sinuses are well-aerated. Small bilateral mastoid effusions. No abnormal sellar expansion. No cerebellar tonsillar ectopia. No suspicious calvarial bone marrow signal. Patient appears edentulous. IMPRESSION: No acute intracranial process, specifically no acute ischemia on this motion degraded examination. Old LEFT corona radiata/ basal ganglia infarct. Mild to moderate chronic small vessel ischemic disease. Electronically Signed   By: Awilda Metro M.D.   On: 11/07/2015 23:51   Dg Chest Port 1 View  11/08/2015  CLINICAL DATA:  Shortness of Breath EXAM: PORTABLE CHEST 1 VIEW COMPARISON:  11/03/2015 FINDINGS: Cardiac shadow is again enlarged. Postsurgical changes are again seen. There is been some improvement in degree of vascular congestion. Small bilateral pleural effusions are noted. Persistent atelectatic changes are noted in the left mid lung. IMPRESSION: Small pleural effusions and left midlung atelectasis. Electronically Signed   By: Alcide Clever M.D.   On: 11/08/2015 08:02     STUDIES:  CT head 2/13. Chronic age-related changes, old infarcts. No acute abnormality. MRI head 2/13. No acute intracranial process Chest x-ray 2/14 bilateral  pleural effusions, basilar opacities  CULTURES:  ANTIBIOTICS: Vanc 02/14 > Azactam 02/14 >  SIGNIFICANT EVENTS:   LINES/TUBES: ETT 02/14 >  ASSESSMENT / PLAN:  PULMONARY A: Hypercarbic respiratory failure - did not respond to BiPAP; therefore, moved to ICU in PM of 02/14 for intubation. P:   Transfer to ICU for intubation. Repeat ABG post intubation. Wean as able. Pulmonary hygiene. Diuresis as below. Follow CXR.  CARDIOVASCULAR A:  Coronary artery disease. S/p multiple stents and CABG. P:  Diamox 250mg  q6hrs x 3 doses. Decrease lasix from 60mg  to 40mg  BID.  RENAL A:   AKI. Hyponatremia - volume overloaded. P:   Diuresis as above. Correct electrolytes as indicated. Follow BMP.  GASTROINTESTINAL A:   GI prophylaxis. Nutrition. P:   SUP: pantoprazole. NPO.  HEMATOLOGIC A:   Mild anemia. Thrombocytopenia. VTE prophylaxis. P:  Transfuse for Hgb < 7. Monitor platelet counts. SCD's. CBC in AM.  INFECTIOUS A:   Recent UTI and LLL PNA. P:   Broaden abx to vanco and aztreonam for now given her recent hospitalizations. Check cultures, UA.  ENDOCRINE A:   Diabetes mellitus. P:   SSI.  NEUROLOGIC A:   Acute hypercarbic + hepatic encephalopathy - failed BiPAP and required intubation PM of 02/14. H/O old strokes with residual Rt weakness. P:   Sedation: Precedex gtt / Fentanyl PRN. RASS goal: 0 to -1. Daily  WUA. F/u on EEG. Continue lactulose.  FAMILY  - Updates: Family updated by Dr. Molli Knock. - Inter-disciplinary family meet or Palliative Care meeting due by:  2/21.  Additional CC time: 40 minutes.   Rutherford Guys, PA - [facility] [place] [facility] Pager: [phone]  or [phone] 11/08/2015, 5:00 PM  Attending Note:  82 year old female with AMS presumed due to hypercarbia however pH is normal. MRI is normal and EEG is pending to see if this is subclinical seizure. I suspect patient is in pulmonary edema  and was given large amounts of lasix resulting in elevation of bicarb and subsequent elevation of carbon dioxide. I spoke with the daughter at length. After discussion, decision was made to proceed with intubation for a few days to see if we will be able to correct the physiologic abnormalities then re-evaluate mental status. Will intubate. Mechanically ventilate. Hold lasix. Use diamox for diureses and addressing of the contraction alkalosis. Precedex drip and PRN fentanyl only for pain but do the best to minimize sedation. Will need further discussion with the family regarding code status.  Patient and son updated bedside.  The patient is critically ill with multiple organ systems failure and requires high complexity decision making for assessment and support, frequent evaluation and titration of therapies, application of advanced monitoring technologies and extensive interpretation of multiple databases.   Critical Care Time devoted to patient care services described in this note is 55 Minutes. This time reflects time of care of this signee Dr [name]. This critical care time does not reflect procedure time, or teaching time or supervisory time of PA/NP/Med student/Med Resident etc but could involve care discussion time.  Alyson Reedy, M.D. [facility] Pulmonary/Critical Care Medicine. Pager: [phone]. After hours pager: [phone].

## 2015-11-08 NOTE — Procedures (Signed)
Central Venous Catheter Insertion Procedure Note [name] [id number] Jul 08, 1931  Procedure: Insertion of Central Venous Catheter Indications: Assessment of intravascular volume, Drug and/or fluid administration and Frequent blood sampling  Procedure Details Consent: Risks of procedure as well as the alternatives and risks of each were explained to the (patient/caregiver).  Consent for procedure obtained. Time Out: Verified patient identification, verified procedure, site/side was marked, verified correct patient position, special equipment/implants available, medications/allergies/relevent history reviewed, required imaging and test results available.  Performed  Maximum sterile technique was used including antiseptics, cap, gloves, gown, hand hygiene, mask and sheet. Skin prep: Chlorhexidine; local anesthetic administered A antimicrobial bonded/coated triple lumen catheter was placed in the left internal jugular vein using the Seldinger technique.  Evaluation Blood flow good Complications: No apparent complications Patient did tolerate procedure well. Chest X-ray ordered to verify placement.  CXR: pending.  Procedure performed under direct ultrasound guidance for real time vessel cannulation.      Rutherford Guys, [place] [facility] Pager: [phone]  or [phone] 11/08/2015, 5:53 PM   Alyson Reedy, M.D. [facility] Pulmonary/Critical Care Medicine. Pager: [phone]. After hours pager: [phone].

## 2015-11-08 NOTE — Progress Notes (Signed)
Midlevel informed troponin 0.66. Will continue to monitor.

## 2015-11-08 NOTE — Progress Notes (Addendum)
[facility] PROGRESS NOTE  [name] [id number]:[id number] DOB: 1931/05/03 DOA: 11/07/2015 PCP: [name], MD    Please refer to admission H&P from earlier this morning for details, 82 year old [place] female with history of type 2 diabetes mellitus, prior history of stroke, hypertension, chronic anemia, , CK D, history of CHF (improved EF of 50% as per last echo in 2016 with basal inferior and basal inferolateral hypokinesis, stress test in 2013 showing reversible defect), CAD with history of cardiac cath in 2006 with 3 stents placed, history of CABG in 2000, was brought to the ED by her daughter with encephalopathy. Patient was recently hospitalized at [facility] one week back for abdominal pain and was diagnosed with UTI. She had change in mental status and was very somnolent however noted reports that she was still discharged from the hospital. Since she came home patient being somnolent appear increased weakness on the right side. In the ED patient was found to be in hypercapnic respiratory failure and placed on BiPAP. MRI brain was negative for stroke. Admitted to hospitalist service on stepdown. PC CM neurology consulted.  Assessment/Plan: Acute respiratory failure with hypercapnia and acute metabolic encephalopathy Patient has severe hypercapnia which could be contributing to encephalopathy. Based on BiPAP and a instituting pressure adjusted by PCCM. Repeat ABG show some improvement but patient still somnolent. High risk of being intubated. Being worked up for sepsis as well. Follow blood cultures. Receiving IV Lasix for adequate diuresis. Check EEG. Seen by neurology and recommends encephalopathy is likely metabolic. Continue neuro checks. -Started empiric antibiotics on admission for concern of occult sepsis.  Acute on chronic systolic CHF Continue BiPAP and IV Lasix 60 mg every 12 hours. Elevated BNP ( 2000). 2-D echo months back shows EF of  50% with basal hypokinesis and grade 1 diastolic dysfunction. Foley placed this morning. Monitor strict I/O.  Elevated troponin Unclear if this is demand ischemia with CHF versus true ACS. EKG shows chronic LBBB with PVCs. Will cycle enzymes. Check 2-D echo.  CAD with history of CABG Holding home meds as patient is NPO. Sees Dr. Tresa Endo. Cardiology consult as needed.  Chronic anemia Hemoglobin stable  Essential hypertension When necessary IV hydralazine.  Type 2 diabetes mellitus Sliding scale coverage.  DVT prophylaxis: Subcutaneous  Diet: Nothing by mouth   Code Status: Full code Family Communication: Daughter bedside who provided the history Disposition Plan: Admit to step down monitoring. High risk for intubation   Consultants:  PC CM  Neurology  Procedures:  MRI brain  EEG  Antibiotics:  IV vancomycin and aztreonam  HPI/Subjective: Admission H&P reviewed. Patient somnolent on BiPAP morning with sternal rub only  Objective: Filed Vitals:   11/08/15 1145 11/08/15 1224  BP:  134/86  Pulse:  66  Temp: 97.8 F (36.6 C)   Resp:  16    Intake/Output Summary (Last 24 hours) at 11/08/15 1342 Last data filed at 11/08/15 1149  Gross per 24 hour  Intake    250 ml  Output    700 ml  Net   -450 ml   Filed Weights   11/08/15 0103  Weight: 89.9 kg (198 lb 3.1 oz)    Exam:   General:  Elderly female lying in bed on BiPAP, somnolent  HEENT: On BiPAP  Cardiovascular: Normal S1 and S2, no murmurs rub or gallop  Respiratory: Diminished bilateral breath sounds  Abdomen: Soft, nondistended, nontender, bowel sounds present  Musculoskeletal: Warm, trace edema bilaterally  CNS: Somnolent, morning  to sternal only, moves all extremity  Data Reviewed: Basic Metabolic Panel:  Recent Labs Lab 11/07/15 1747 11/07/15 1750 11/08/15 0220  NA 131* 129* 131*  K 4.9 4.8 4.7  CL 90* 88* 89*  CO2 29  --  31  GLUCOSE 110* 107* 93  BUN 45* 47* 45*   CREATININE 1.43* 1.50* 1.48*  CALCIUM 8.2*  --  8.4*   Liver Function Tests:  Recent Labs Lab 11/07/15 1747 11/08/15 0220  AST 64* 64*  ALT 28 29  ALKPHOS 123 131*  BILITOT 0.6 0.9  PROT 6.0* 6.2*  ALBUMIN 2.9* 3.1*   No results for input(s): LIPASE, AMYLASE in the last 168 hours.  Recent Labs Lab 11/08/15 0220  AMMONIA 59*   CBC:  Recent Labs Lab 11/07/15 1747 11/07/15 1750 11/08/15 0220  WBC 7.9  --  7.8  NEUTROABS 5.9  --  6.0  HGB 7.9* 10.5* 8.5*  HCT 26.9* 31.0* 28.2*  MCV 90.6  --  91.6  PLT 101*  --  97*   Cardiac Enzymes:  Recent Labs Lab 11/08/15 0220  TROPONINI 0.66*   BNP (last 3 results)  Recent Labs  09/02/15 0715 11/08/15 0220  BNP 998.9* 2328.7*    ProBNP (last 3 results) No results for input(s): PROBNP in the last 8760 hours.  CBG:  Recent Labs Lab 11/08/15 0311  GLUCAP 102*    Recent Results (from the past 240 hour(s))  Urine culture     Status: None (Preliminary result)   Collection Time: 11/07/15  7:49 PM  Result Value Ref Range Status   Specimen Description URINE, CATHETERIZED  Final   Special Requests NONE  Final   Culture NO GROWTH < 24 HOURS  Final   Report Status PENDING  Incomplete  MRSA PCR Screening     Status: None   Collection Time: 11/08/15  1:06 AM  Result Value Ref Range Status   MRSA by PCR NEGATIVE NEGATIVE Final    Comment:        The GeneXpert MRSA Assay (FDA approved for NASAL specimens only), is one component of a comprehensive MRSA colonization surveillance program. It is not intended to diagnose MRSA infection nor to guide or monitor treatment for MRSA infections.      Studies: Ct Head Wo Contrast  11/07/2015  CLINICAL DATA:  82 year old female with right-sided weakness and facial droop. EXAM: CT HEAD WITHOUT CONTRAST TECHNIQUE: Contiguous axial images were obtained from the base of the skull through the vertex without intravenous contrast. COMPARISON:  Head CT dated 10/13/2015 an MRI  dated 08/22/2015 FINDINGS: There is no acute intracranial hemorrhage. There is moderate age-related atrophy and chronic microvascular ischemic. Old left frontal infarct and encephalomalacia of with compensatory dilatation of the left lateral ventricle noted with old left basal ganglia infarct appears stable to prior study. There is no mass effect or midline shift. The visualized paranasal sinuses and mastoid air cells are well aerated. The calvarium is intact. IMPRESSION: No acute intracranial hemorrhage. Age-related atrophy and chronic microvascular ischemic disease. Stable old left frontal and basal ganglia infarcts and encephalomalacia. If symptoms persist and there are no contraindications, MRI may provide better evaluation if clinically indicated. Electronically Signed   By: Elgie Collard M.D.   On: 11/07/2015 19:20   Mr Brain Wo Contrast  11/07/2015  CLINICAL DATA:  Increasing RIGHT-sided weakness and facial droop after being diagnosed with urinary tract infection last week. History of stroke resulting in RIGHT-sided weakness. History of diabetes, hypertension. EXAM: MRI HEAD  WITHOUT CONTRAST TECHNIQUE: Multiplanar, multiecho pulse sequences of the brain and surrounding structures were obtained without intravenous contrast. COMPARISON:  CT head November 07, 2015 at 1846 hours and MRI brain August 22, 2015 FINDINGS: No reduced diffusion to suggest acute ischemia. Gradient sequence is moderately motion degraded, susceptibility artifact in LEFT cerebellum seen on prior MRI is not apparent on this motion degraded examination. Old LEFT basal ganglia/corona radiata infarct with ex vacuo dilatation of LEFT lateral ventricle. Asymmetrically smaller LEFT cerebral peduncle compatible with wallerian degeneration. No hydrocephalus. No midline shift, mass effect or mass lesions. Patchy supratentorial white matter T2 hyperintensities exclusive of the aforementioned abnormality. No abnormal extra-axial fluid  collections. Normal intracranial vascular flow voids present at skull base. Status post bilateral ocular lens implants. Imaged paranasal sinuses are well-aerated. Small bilateral mastoid effusions. No abnormal sellar expansion. No cerebellar tonsillar ectopia. No suspicious calvarial bone marrow signal. Patient appears edentulous. IMPRESSION: No acute intracranial process, specifically no acute ischemia on this motion degraded examination. Old LEFT corona radiata/ basal ganglia infarct. Mild to moderate chronic small vessel ischemic disease. Electronically Signed   By: Awilda Metro M.D.   On: 11/07/2015 23:51   Dg Chest Port 1 View  11/08/2015  CLINICAL DATA:  Shortness of Breath EXAM: PORTABLE CHEST 1 VIEW COMPARISON:  11/03/2015 FINDINGS: Cardiac shadow is again enlarged. Postsurgical changes are again seen. There is been some improvement in degree of vascular congestion. Small bilateral pleural effusions are noted. Persistent atelectatic changes are noted in the left mid lung. IMPRESSION: Small pleural effusions and left midlung atelectasis. Electronically Signed   By: Alcide Clever M.D.   On: 11/08/2015 08:02    Scheduled Meds: . albuterol  2.5 mg Nebulization BID  . aspirin  300 mg Rectal Daily  . aztreonam  1 g Intravenous 3 times per day  . doxycycline (VIBRAMYCIN) IV  100 mg Intravenous Q12H  . enoxaparin (LOVENOX) injection  40 mg Subcutaneous QHS  . furosemide  60 mg Intravenous BID  . insulin aspart  0-9 Units Subcutaneous Q4H  . lactulose  300 mL Rectal Daily  . sodium chloride flush  3 mL Intravenous Q12H  . [START ON 11/09/2015] vancomycin  1,250 mg Intravenous Q24H   Continuous Infusions:     Time spent: 20 minutes    [name]  [facility] Pager [phone]. If 7PM-7AM, please contact night-coverage at [email or web address], password [facility] 11/08/2015, 1:42 PM  LOS: 1 day

## 2015-11-08 NOTE — Procedures (Signed)
Intubation Procedure Note [name] [id number] [birth year]  Procedure: Intubation Indications: Respiratory insufficiency  Procedure Details Consent: Risks of procedure as well as the alternatives and risks of each were explained to the (patient/caregiver).  Consent for procedure obtained. Time Out: Verified patient identification, verified procedure, site/side was marked, verified correct patient position, special equipment/implants available, medications/allergies/relevent history reviewed, required imaging and test results available.  Performed  Maximum sterile technique was used including gloves, hand hygiene and mask.  MAC    Evaluation Hemodynamic Status: BP stable throughout; O2 sats: stable throughout Patient's Current Condition: stable Complications: No apparent complications Patient did tolerate procedure well. Chest X-ray ordered to verify placement.  CXR: pending.   [name] 11/08/2015

## 2015-11-08 NOTE — Progress Notes (Signed)
Rt Note: Patient was transported while on Bipap to unit 2H. Patient tolerated transport well with no complications. 2H unit RT received patient onto unit. Rt will continue to monitor.

## 2015-11-08 NOTE — Progress Notes (Signed)
Dr. Gonzella Lex  Paged with critical CO2 of 76.1. Oncoming nurse informed.

## 2015-11-08 NOTE — Telephone Encounter (Signed)
Please call asap

## 2015-11-08 NOTE — Progress Notes (Signed)
Bipap setting changed per respiratory therapies guidance and MD order, repeat ABG order one hour later at 0930

## 2015-11-08 NOTE — Progress Notes (Signed)
[facility]  Patient Status: Active (receiving services up to time of hospitalization)  [facility] is providing the following services: RN, PT and OT  If patient discharges after hours, please call [phone].   [name] 11/08/2015, 10:45 AM

## 2015-11-08 NOTE — Progress Notes (Signed)
Lactulose enema attempted, pt mentation unable to understand need to retain medication she continuously and intentionally expelling medication therefore was unable to retain any of the enema. Will notify MD and continue to monitor.

## 2015-11-08 NOTE — Consult Note (Signed)
NEURO HOSPITALIST CONSULT NOTE   Requestig physician: Dr. Gonzella Lex   Reason for Consult: AMS in setting of sever hypercapnia  HPI:                                                                                                                                          [name] is an 82 y.o. female with history of diabetes mellitus type 2, previous stroke, hypertension, chronic anemia and chronic kidney disease who was recently hospitalized and placed on Levaquin for UTI. At discharge she was not taking any further ABx.  She was brought to the ER after patient was found to be in altered mental status. Patient was recently admitted to the hospital at [facility] last week for abdominal pain and at that time patient was diagnosed with UTI and discharged home after being treated on Levaquin. Since discharge 2 days ago patient has been very somnolent. Denies having taken any pain medications or sedatives. On exam patient is minimally responsive. Moves all extremities. MRI of the brain is negative for anything acute. ABG shows hypercapnia with pCo2 76.1 while on BiPAP.Sodium 131, Cr 1.48, Ammonia 59, BNP 2328.  Past Medical History  Diagnosis Date  . Diabetes mellitus   . Hypertension   . Stroke [facility]) Right Side Weakness  . CHF (congestive heart failure) (HCC)     2D ECHO, 09/13/2011 - EF 40-45%, normal  . 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 Pain in limb     BILATERAL EXTREMITY VENOUS DUPLEX, 01/08/2011 - no evidence of deep vein or superficial thrombosis or Baker's cyst    Past Surgical History  Procedure Laterality Date  . Cardiac surgery    . Cardiac catheterization  10/27/2004    3 stents placed, 3.5x28 proximally,3.5x33 mid segment, and 3.5x33 in the region of the crux, stenosis being  reduced to 0% at proximal and mid segment and being reduced from 80% ot 10% in the stent at the cruxed segment  . Cardiac catheterization  09/26/2004    High grade 95% ostial stenosis in the RCA with 70% mid stenosis, 95% distal stenosis and total occlusion of the PDA with collaterals  . Coronary artery bypass graft  02/24/1999    x4; IMA to distal LAD, left radial to first circ, SVG to first diagonal, SVG to posterior descending coronary artery    Family History  Problem Relation Age of Onset  . Cancer Mother     Lung  . Heart disease Brother   . Hypertension Brother   . Heart disease Brother   . Hypertension Brother  Social History:  reports that she has never smoked. She has never used smokeless tobacco. She reports that she does not drink alcohol or use illicit drugs.  Allergies  Allergen Reactions  . Penicillins Anaphylaxis, Itching and Swelling    Has patient had a PCN reaction causing immediate rash, facial/tongue/throat swelling, SOB or lightheadedness with hypotension: Yes Has patient had a PCN reaction causing severe rash involving mucus membranes or skin necrosis: No  Has patient had a PCN reaction that required hospitalization: Unknown Has patient had a PCN reaction occurring within the last 10 years: No  If all of the above answers are "NO", then may proceed with Cephalosporin use.     MEDICATIONS:                                                                                                                     Prior to Admission:  Prescriptions prior to admission  Medication Sig Dispense Refill Last Dose  . albuterol (PROVENTIL) (2.5 MG/3ML) 0.083% nebulizer solution Take 2.5 mg by nebulization 2 (two) times daily.    11/07/2015 at Unknown time  . albuterol-ipratropium (COMBIVENT) 18-103 MCG/ACT inhaler Inhale 1 puff into the lungs every 4 (four) hours as needed.    11/06/2015 at Unknown time  . allopurinol (ZYLOPRIM) 100 MG tablet Take 100 mg by mouth daily.    11/07/2015 at Unknown time  . atorvastatin (LIPITOR) 40 MG tablet Take 1 tablet (40 mg total) by mouth daily. (Patient taking differently: Take 40 mg by mouth daily at 6 PM. ) 90 tablet 3 11/06/2015 at Unknown time  . clopidogrel (PLAVIX) 75 MG tablet Take 1 tablet (75 mg total) by mouth daily. 90 tablet 3 11/07/2015 at 800  . colchicine 0.6 MG tablet Take 0.6 mg by mouth daily.    11/07/2015 at Unknown time  . CRANBERRY CONCENTRATE PO Take 1 tablet by mouth 2 (two) times daily.   11/07/2015 at Unknown time  . folic acid (FOLVITE) 1 MG tablet TAKE ONE TABLET BY MOUTH ONCE DAILY 30 tablet 11 11/07/2015 at Unknown time  . isosorbide mononitrate (IMDUR) 60 MG 24 hr tablet Take 1 tablet (60 mg total) by mouth daily. NEED OV. 30 tablet 2 11/07/2015 at Unknown time  . JANUVIA 50 MG tablet Take 50 mg by mouth daily with breakfast.    11/06/2015 at Unknown time  . metoprolol (LOPRESSOR) 50 MG tablet Take 1 tablet (50 mg total) by mouth 2 (two) times daily. 30 tablet 6 11/07/2015 at 800  . nitroGLYCERIN (NITROSTAT) 0.4 MG SL tablet Place 1 tablet (0.4 mg total) under the tongue as needed. 25 tablet 4 Past Week at Unknown time  . omega-3 acid ethyl esters (LOVAZA) 1 G capsule TAKE TWO CAPSULES BY MOUTH TWICE DAILY (Patient taking differently: Take 1 g by mouth 2 (two) times daily. ) 120 capsule 10 11/07/2015 at Unknown time  . pantoprazole (PROTONIX) 40 MG tablet Take 1 tablet (40 mg total) by mouth daily. Make appt for refills. 30 tablet  6 11/07/2015 at Unknown time  . polysaccharide iron (NIFEREX) 150 MG CAPS capsule Take 150 mg by mouth 2 (two) times daily.    11/07/2015 at Unknown time  . primidone (MYSOLINE) 50 MG tablet Take 50 mg by mouth at bedtime.    11/06/2015 at Unknown time  . senna (SENOKOT) 8.6 MG tablet Take 1 tablet by mouth 2 (two) times daily.    11/07/2015 at Unknown time  . torsemide (DEMADEX) 20 MG tablet Take 2 tablets (40 mg total) by mouth daily. (Patient taking differently: Take 20 mg by mouth 2  (two) times daily. ) 60 tablet 6 11/07/2015 at Unknown time   Scheduled: . albuterol  2.5 mg Nebulization BID  . aspirin  300 mg Rectal Daily  . doxycycline (VIBRAMYCIN) IV  100 mg Intravenous Q12H  . enoxaparin (LOVENOX) injection  40 mg Subcutaneous QHS  . furosemide  60 mg Intravenous BID  . insulin aspart  0-9 Units Subcutaneous Q4H  . sodium chloride flush  3 mL Intravenous Q12H     ROS:                                                                                                                                       History obtained from unobtainable from patient due to mental status   Blood pressure 106/70, pulse 70, temperature 97.7 F (36.5 C), temperature source Axillary, resp. rate 15, height 5\' 2"  (1.575 m), weight 89.9 kg (198 lb 3.1 oz), SpO2 100 %.   Neurologic Examination:                                                                                                      HEENT-  Normocephalic, no lesions, without obvious abnormality.  Normal external eye and conjunctiva.  Normal TM's bilaterally.  Normal auditory canals and external ears. Normal external nose, mucus membranes and septum.  Normal pharynx. Cardiovascular- S1, S2 normal, pulses palpable throughout   Lungs- chest clear, no wheezing, rales, normal symmetric air entry Abdomen- normal findings: bowel sounds normal Extremities- no edema Lymph-no adenopathy palpable Musculoskeletal-no joint tenderness, deformity or swelling Skin-warm and dry, no hyperpigmentation, vitiligo, or suspicious lesions  Neurological Examination Mental Status: On BiPAP, only responds to pain by grimacing and localizing.  Cranial Nerves: II: Discs flat bilaterally; Visual fields difficult to assess but seems to blink to threat better on the right than left. Holds eyes clenched tightly when attempting to assess.  III,IV, VI:  extra-ocular motions intact bilaterally V,VII:  face symmetric, facial light touch sensation normal  bilaterally VIII: hearing normal bilaterally IX,X: uvula rises symmetrically XI: bilateral shoulder shrug XII: midline tongue extension Motor: Localizes bilaterally with arms to sternal rub and able to lift bilateral arms antigravity. Left leg bend to noxious stimuli but not the right.  Sensory: Pinprick and light touch intact throughout, bilaterally Deep Tendon Reflexes: 1+ and symmetric throughout Plantars: Right: downgoing   Left: downgoing    Lab Results: Basic Metabolic Panel:  Recent Labs Lab 11/07/15 1747 11/07/15 1750 11/08/15 0220  NA 131* 129* 131*  K 4.9 4.8 4.7  CL 90* 88* 89*  CO2 29  --  31  GLUCOSE 110* 107* 93  BUN 45* 47* 45*  CREATININE 1.43* 1.50* 1.48*  CALCIUM 8.2*  --  8.4*    Liver Function Tests:  Recent Labs Lab 11/07/15 1747 11/08/15 0220  AST 64* 64*  ALT 28 29  ALKPHOS 123 131*  BILITOT 0.6 0.9  PROT 6.0* 6.2*  ALBUMIN 2.9* 3.1*   No results for input(s): LIPASE, AMYLASE in the last 168 hours.  Recent Labs Lab 11/08/15 0220  AMMONIA 59*    CBC:  Recent Labs Lab 11/07/15 1747 11/07/15 1750 11/08/15 0220  WBC 7.9  --  7.8  NEUTROABS 5.9  --  6.0  HGB 7.9* 10.5* 8.5*  HCT 26.9* 31.0* 28.2*  MCV 90.6  --  91.6  PLT 101*  --  97*    Cardiac Enzymes:  Recent Labs Lab 11/08/15 0220  TROPONINI 0.66*    Lipid Panel: No results for input(s): CHOL, TRIG, HDL, CHOLHDL, VLDL, LDLCALC in the last 168 hours.  CBG:  Recent Labs Lab 11/08/15 0311  GLUCAP 102*    Microbiology: Results for orders placed or performed during the hospital encounter of 11/07/15  MRSA PCR Screening     Status: None   Collection Time: 11/08/15  1:06 AM  Result Value Ref Range Status   MRSA by PCR NEGATIVE NEGATIVE Final    Comment:        The GeneXpert MRSA Assay (FDA approved for NASAL specimens only), is one component of a comprehensive MRSA colonization surveillance program. It is not intended to diagnose MRSA infection nor to  guide or monitor treatment for MRSA infections.     Coagulation Studies:  Recent Labs  11/07/15 1747  LABPROT 13.9  INR 1.05    Imaging: Ct Head Wo Contrast  11/07/2015  CLINICAL DATA:  82 year old female with right-sided weakness and facial droop. EXAM: CT HEAD WITHOUT CONTRAST TECHNIQUE: Contiguous axial images were obtained from the base of the skull through the vertex without intravenous contrast. COMPARISON:  Head CT dated 10/13/2015 an MRI dated 08/22/2015 FINDINGS: There is no acute intracranial hemorrhage. There is moderate age-related atrophy and chronic microvascular ischemic. Old left frontal infarct and encephalomalacia of with compensatory dilatation of the left lateral ventricle noted with old left basal ganglia infarct appears stable to prior study. There is no mass effect or midline shift. The visualized paranasal sinuses and mastoid air cells are well aerated. The calvarium is intact. IMPRESSION: No acute intracranial hemorrhage. Age-related atrophy and chronic microvascular ischemic disease. Stable old left frontal and basal ganglia infarcts and encephalomalacia. If symptoms persist and there are no contraindications, MRI may provide better evaluation if clinically indicated. Electronically Signed   By: Elgie Collard M.D.   On: 11/07/2015 19:20   Mr Brain Wo Contrast  11/07/2015  CLINICAL DATA:  Increasing RIGHT-sided weakness and facial droop after being  diagnosed with urinary tract infection last week. History of stroke resulting in RIGHT-sided weakness. History of diabetes, hypertension. EXAM: MRI HEAD WITHOUT CONTRAST TECHNIQUE: Multiplanar, multiecho pulse sequences of the brain and surrounding structures were obtained without intravenous contrast. COMPARISON:  CT head November 07, 2015 at 1846 hours and MRI brain August 22, 2015 FINDINGS: No reduced diffusion to suggest acute ischemia. Gradient sequence is moderately motion degraded, susceptibility artifact in LEFT  cerebellum seen on prior MRI is not apparent on this motion degraded examination. Old LEFT basal ganglia/corona radiata infarct with ex vacuo dilatation of LEFT lateral ventricle. Asymmetrically smaller LEFT cerebral peduncle compatible with wallerian degeneration. No hydrocephalus. No midline shift, mass effect or mass lesions. Patchy supratentorial white matter T2 hyperintensities exclusive of the aforementioned abnormality. No abnormal extra-axial fluid collections. Normal intracranial vascular flow voids present at skull base. Status post bilateral ocular lens implants. Imaged paranasal sinuses are well-aerated. Small bilateral mastoid effusions. No abnormal sellar expansion. No cerebellar tonsillar ectopia. No suspicious calvarial bone marrow signal. Patient appears edentulous. IMPRESSION: No acute intracranial process, specifically no acute ischemia on this motion degraded examination. Old LEFT corona radiata/ basal ganglia infarct. Mild to moderate chronic small vessel ischemic disease. Electronically Signed   By: Awilda Metro M.D.   On: 11/07/2015 23:51   Dg Chest Port 1 View  11/08/2015  CLINICAL DATA:  Shortness of Breath EXAM: PORTABLE CHEST 1 VIEW COMPARISON:  11/03/2015 FINDINGS: Cardiac shadow is again enlarged. Postsurgical changes are again seen. There is been some improvement in degree of vascular congestion. Small bilateral pleural effusions are noted. Persistent atelectatic changes are noted in the left mid lung. IMPRESSION: Small pleural effusions and left midlung atelectasis. Electronically Signed   By: Alcide Clever M.D.   On: 11/08/2015 08:02       Assessment and plan per attending neurologist  Felicie Morn PA-C [facility] [phone]  11/08/2015, 8:33 AM   Assessment/Plan:  82 YO female with AMS in setting of multiple metabolic abnormalities. Most likely hypercapnic encephalopathy along with metabolic issues. MRI brain is negative.  Will obtain EEG to  confirm this is not subclinical seizure, however she is localizing to pain which make me think this is less likely.

## 2015-11-08 NOTE — Telephone Encounter (Signed)
Spoke with patty, she reports the pt is currently admitted. They will be unable to draw bmp today.

## 2015-11-08 NOTE — Consult Note (Addendum)
PULMONARY / CRITICAL CARE MEDICINE   Name: [name] MRN: [id number] DOB: 11/19/1930    ADMISSION DATE:  11/07/2015 CONSULTATION DATE:  11/08/15  REFERRING MD: Lars Masson, A  CHIEF COMPLAINT:  Altered mental status, hypercarbic resp failure.   HISTORY OF PRESENT ILLNESS:   [name] is a 82 year old with past medical history of diabetes, stroke, hypertension, anemia, coronary artery disease s/p CABG. She has admissions to [facility] on 1/9 for LLL PNA and then again on 2/9 with UTI for which she was treated with levaquin. After discharge 2/11 the family reports that she is becoming increasingly somnolent with worsening right-sided facial droop and weakness. Admitted to [facility] on 2/13 for evaluation of lethargy. CTA and MRI showed old stroke, age related changes but no acute abnormality. She is placed on BiPAP for hypercarbia however her CO2 continues to raise. PCCM called for evaluation.  PAST MEDICAL HISTORY :  She  has a past medical history of Diabetes mellitus; Hypertension; Stroke [facility]) (Right Side Weakness); CHF (congestive heart failure) (HCC); Chest pain; and Pain in limb.  PAST SURGICAL HISTORY: She  has past surgical history that includes Cardiac surgery; Cardiac catheterization (10/27/2004); Cardiac catheterization (09/26/2004); and Coronary artery bypass graft (02/24/1999).  Allergies  Allergen Reactions  . Penicillins Anaphylaxis, Itching and Swelling    Has patient had a PCN reaction causing immediate rash, facial/tongue/throat swelling, SOB or lightheadedness with hypotension: Yes Has patient had a PCN reaction causing severe rash involving mucus membranes or skin necrosis: No  Has patient had a PCN reaction that required hospitalization: Unknown Has patient had a PCN reaction occurring within the last 10 years: No  If all of the above answers are "NO", then may proceed with Cephalosporin use.     No current facility-administered medications on  file prior to encounter.   Current Outpatient Prescriptions on File Prior to Encounter  Medication Sig  . albuterol (PROVENTIL) (2.5 MG/3ML) 0.083% nebulizer solution Take 2.5 mg by nebulization 2 (two) times daily.   Marland Kitchen albuterol-ipratropium (COMBIVENT) 18-103 MCG/ACT inhaler Inhale 1 puff into the lungs every 4 (four) hours as needed.   Marland Kitchen allopurinol (ZYLOPRIM) 100 MG tablet Take 100 mg by mouth daily.  Marland Kitchen atorvastatin (LIPITOR) 40 MG tablet Take 1 tablet (40 mg total) by mouth daily. (Patient taking differently: Take 40 mg by mouth daily at 6 PM. )  . clopidogrel (PLAVIX) 75 MG tablet Take 1 tablet (75 mg total) by mouth daily.  . colchicine 0.6 MG tablet Take 0.6 mg by mouth daily.   . folic acid (FOLVITE) 1 MG tablet TAKE ONE TABLET BY MOUTH ONCE DAILY  . isosorbide mononitrate (IMDUR) 60 MG 24 hr tablet Take 1 tablet (60 mg total) by mouth daily. NEED OV.  Marland Kitchen JANUVIA 50 MG tablet Take 50 mg by mouth daily with breakfast.   . metoprolol (LOPRESSOR) 50 MG tablet Take 1 tablet (50 mg total) by mouth 2 (two) times daily.  . nitroGLYCERIN (NITROSTAT) 0.4 MG SL tablet Place 1 tablet (0.4 mg total) under the tongue as needed.  Marland Kitchen omega-3 acid ethyl esters (LOVAZA) 1 G capsule TAKE TWO CAPSULES BY MOUTH TWICE DAILY (Patient taking differently: Take 1 g by mouth 2 (two) times daily. )  . pantoprazole (PROTONIX) 40 MG tablet Take 1 tablet (40 mg total) by mouth daily. Make appt for refills.  . polysaccharide iron (NIFEREX) 150 MG CAPS capsule Take 150 mg by mouth 2 (two) times daily.   . primidone (MYSOLINE) 50  MG tablet Take 50 mg by mouth at bedtime.   . senna (SENOKOT) 8.6 MG tablet Take 1 tablet by mouth 2 (two) times daily.   Marland Kitchen torsemide (DEMADEX) 20 MG tablet Take 2 tablets (40 mg total) by mouth daily. (Patient taking differently: Take 20 mg by mouth 2 (two) times daily. )  . [DISCONTINUED] pregabalin (LYRICA) 75 MG capsule Take 75 mg by mouth daily.      FAMILY HISTORY:  Her indicated that  her mother is deceased. She indicated that her father is deceased. She indicated that all of her three sisters are alive. She indicated that all of her three brothers are deceased. She indicated that her maternal grandmother is deceased. She indicated that her maternal grandfather is deceased. She indicated that her paternal grandmother is deceased. She indicated that her paternal grandfather is deceased. She indicated that all of her three childs are alive.   SOCIAL HISTORY: She  reports that she has never smoked. She has never used smokeless tobacco. She reports that she does not drink alcohol or use illicit drugs.  REVIEW OF SYSTEMS:   Unable to obtain as pt is unresponsive.  SUBJECTIVE:    VITAL SIGNS: BP 106/70 mmHg  Pulse 70  Temp(Src) 97.7 F (36.5 C) (Axillary)  Resp 15  Ht  (1.575 m)  Wt 198 lb 3.1 oz (89.9 kg)  BMI 36.24 kg/m2  SpO2 100%  HEMODYNAMICS:    VENTILATOR SETTINGS: Vent Mode:  [-]  FiO2 (%):  [30 %] 30 %  INTAKE / OUTPUT: I/O last 3 completed shifts: In: 250 [IV Piggyback:250] Out: 100 [Urine:100]  PHYSICAL EXAMINATION: General: Sedated, somnolent Neuro:  Response to pain HEENT:  Moist mucous membranes, no family, JVD Cardiovascular:  Rate and rhythm Lungs:  Clear anteriorly Abdomen: Soft, positive bowel sounds Skin: Intact  LABS:  BMET  Recent Labs Lab 11/07/15 1747 11/07/15 1750 11/08/15 0220  NA 131* 129* 131*  K 4.9 4.8 4.7  CL 90* 88* 89*  CO2 29  --  31  BUN 45* 47* 45*  CREATININE 1.43* 1.50* 1.48*  GLUCOSE 110* 107* 93    Electrolytes  Recent Labs Lab 11/07/15 1747 11/08/15 0220  CALCIUM 8.2* 8.4*    CBC  Recent Labs Lab 11/07/15 1747 11/07/15 1750 11/08/15 0220  WBC 7.9  --  7.8  HGB 7.9* 10.5* 8.5*  HCT 26.9* 31.0* 28.2*  PLT 101*  --  97*    Coag's  Recent Labs Lab 11/07/15 1747  APTT 39*  INR 1.05    Sepsis Markers  Recent Labs Lab 11/08/15 0203 11/08/15 0220  LATICACIDVEN 1.4  --    PROCALCITON  --  0.39    ABG  Recent Labs Lab 11/08/15 0009 11/08/15 0701  PHART 7.312* 7.260*  PCO2ART 65.6* 76.1*  PO2ART 58.0* 87.2    Liver Enzymes  Recent Labs Lab 11/07/15 1747 11/08/15 0220  AST 64* 64*  ALT 28 29  ALKPHOS 123 131*  BILITOT 0.6 0.9  ALBUMIN 2.9* 3.1*    Cardiac Enzymes  Recent Labs Lab 11/08/15 0220  TROPONINI 0.66*    Glucose  Recent Labs Lab 11/08/15 0311  GLUCAP 102*    Imaging Ct Head Wo Contrast  11/07/2015  CLINICAL DATA:  82 year old female with right-sided weakness and facial droop. EXAM: CT HEAD WITHOUT CONTRAST TECHNIQUE: Contiguous axial images were obtained from the base of the skull through the vertex without intravenous contrast. COMPARISON:  Head CT dated 10/13/2015 an MRI dated 08/22/2015 FINDINGS: There  is no acute intracranial hemorrhage. There is moderate age-related atrophy and chronic microvascular ischemic. Old left frontal infarct and encephalomalacia of with compensatory dilatation of the left lateral ventricle noted with old left basal ganglia infarct appears stable to prior study. There is no mass effect or midline shift. The visualized paranasal sinuses and mastoid air cells are well aerated. The calvarium is intact. IMPRESSION: No acute intracranial hemorrhage. Age-related atrophy and chronic microvascular ischemic disease. Stable old left frontal and basal ganglia infarcts and encephalomalacia. If symptoms persist and there are no contraindications, MRI may provide better evaluation if clinically indicated. Electronically Signed   By: Elgie Collard M.D.   On: 11/07/2015 19:20   Mr Brain Wo Contrast  11/07/2015  CLINICAL DATA:  Increasing RIGHT-sided weakness and facial droop after being diagnosed with urinary tract infection last week. History of stroke resulting in RIGHT-sided weakness. History of diabetes, hypertension. EXAM: MRI HEAD WITHOUT CONTRAST TECHNIQUE: Multiplanar, multiecho pulse sequences of the  brain and surrounding structures were obtained without intravenous contrast. COMPARISON:  CT head November 07, 2015 at 1846 hours and MRI brain August 22, 2015 FINDINGS: No reduced diffusion to suggest acute ischemia. Gradient sequence is moderately motion degraded, susceptibility artifact in LEFT cerebellum seen on prior MRI is not apparent on this motion degraded examination. Old LEFT basal ganglia/corona radiata infarct with ex vacuo dilatation of LEFT lateral ventricle. Asymmetrically smaller LEFT cerebral peduncle compatible with wallerian degeneration. No hydrocephalus. No midline shift, mass effect or mass lesions. Patchy supratentorial white matter T2 hyperintensities exclusive of the aforementioned abnormality. No abnormal extra-axial fluid collections. Normal intracranial vascular flow voids present at skull base. Status post bilateral ocular lens implants. Imaged paranasal sinuses are well-aerated. Small bilateral mastoid effusions. No abnormal sellar expansion. No cerebellar tonsillar ectopia. No suspicious calvarial bone marrow signal. Patient appears edentulous. IMPRESSION: No acute intracranial process, specifically no acute ischemia on this motion degraded examination. Old LEFT corona radiata/ basal ganglia infarct. Mild to moderate chronic small vessel ischemic disease. Electronically Signed   By: Awilda Metro M.D.   On: 11/07/2015 23:51   Dg Chest Port 1 View  11/08/2015  CLINICAL DATA:  Shortness of Breath EXAM: PORTABLE CHEST 1 VIEW COMPARISON:  11/03/2015 FINDINGS: Cardiac shadow is again enlarged. Postsurgical changes are again seen. There is been some improvement in degree of vascular congestion. Small bilateral pleural effusions are noted. Persistent atelectatic changes are noted in the left mid lung. IMPRESSION: Small pleural effusions and left midlung atelectasis. Electronically Signed   By: Alcide Clever M.D.   On: 11/08/2015 08:02     STUDIES:  CT head 2/13. Chronic  age-related changes, old infarcts. No acute abnormality. MRI head 2/13. No acute intracranial process Chest x-ray 2/14 bilateral pleural effusions, basilar opacities  CULTURES:  ANTIBIOTICS: Doxy 2/13 >>  SIGNIFICANT EVENTS:   LINES/TUBES:  DISCUSSION: 82 year old with hypercarbic respiratory failure, altered mental status. Recent admission for UTI. Etiology of hypercarbia is likely secondary to heart failure. She has elevated BNP although the chest x-ray does not look much worse than before. She also has a recent UTI. MRI and CT had not concerning for an acute stroke but she needs to be ruled out for seizures.  ASSESSMENT / PLAN:  PULMONARY A: Hypercarbic respiratory failure P:   Continue BiPAP. Increase inspiratory pressure to 20/5. Recheck ABG in 1 hour She will need intubation if there is no improvement Check Procalcitonin and lactic acid  CARDIOVASCULAR A:  Coronary artery disease S/p multiple stents and CABG P:  Lasix diuresis Check EKG and troponins.  RENAL A:   AKI P:   Monitor Urine output and Cr  GASTROINTESTINAL A:   No issues P:   Keep NPO  HEMATOLOGIC A:   Stable P:    INFECTIOUS A:   Recent UTI and LLL PNA P:   Broaden abx to vanco and aztreonam for now given her recent hospitalizations Check cultures, UA.  ENDOCRINE A:   Diabetes mellitus. P:   Insulin aspart q4 hrs  NEUROLOGIC A:   Altered mental status H/O old strokes with residual Rt weakness P:   Check EEG Agree with neuro consult. Start lactulose for elevated ammonia. LFTs are Ok.  FAMILY  - Updates:  - Inter-disciplinary family meet or Palliative Care meeting due by:  2/21.  Critical care time- 35 mins. Daughter updated at bedside  Chilton Greathouse MD [facility] Pager [phone] If no answer or after 3pm call: [phone] 11/08/2015, 10:00 AM

## 2015-11-09 ENCOUNTER — Telehealth: Payer: Self-pay | Admitting: General Practice

## 2015-11-09 ENCOUNTER — Inpatient Hospital Stay (HOSPITAL_COMMUNITY): Payer: Medicare Other

## 2015-11-09 DIAGNOSIS — I5033 Acute on chronic diastolic (congestive) heart failure: Secondary | ICD-10-CM

## 2015-11-09 DIAGNOSIS — E1159 Type 2 diabetes mellitus with other circulatory complications: Secondary | ICD-10-CM

## 2015-11-09 LAB — BASIC METABOLIC PANEL
ANION GAP: 14 (ref 5–15)
Anion gap: 15 (ref 5–15)
BUN: 44 mg/dL — AB (ref 6–20)
BUN: 42 mg/dL — AB (ref 6–20)
CALCIUM: 8.3 mg/dL — AB (ref 8.9–10.3)
CHLORIDE: 93 mmol/L — AB (ref 101–111)
CO2: 28 mmol/L (ref 22–32)
CO2: 27 mmol/L (ref 22–32)
Calcium: 8.3 mg/dL — ABNORMAL LOW (ref 8.9–10.3)
Chloride: 94 mmol/L — ABNORMAL LOW (ref 101–111)
Creatinine, Ser: 1.29 mg/dL — ABNORMAL HIGH (ref 0.44–1.00)
Creatinine, Ser: 1.3 mg/dL — ABNORMAL HIGH (ref 0.44–1.00)
GFR calc Af Amer: 43 mL/min — ABNORMAL LOW (ref >60)
GFR calc Af Amer: 43 mL/min — ABNORMAL LOW (ref >60)
GFR calc non Af Amer: 37 mL/min — ABNORMAL LOW (ref >60)
GFR, EST NON AFRICAN AMERICAN: 37 mL/min — AB (ref >60)
GLUCOSE: 112 mg/dL — AB (ref 65–99)
GLUCOSE: 117 mg/dL — AB (ref 65–99)
POTASSIUM: 3.1 mmol/L — AB (ref 3.5–5.1)
Potassium: 3.7 mmol/L (ref 3.5–5.1)
Sodium: 135 mmol/L (ref 135–145)
Sodium: 136 mmol/L (ref 135–145)

## 2015-11-09 LAB — CBC
HCT: 27.2 % — ABNORMAL LOW (ref 36.0–46.0)
HEMOGLOBIN: 8.1 g/dL — AB (ref 12.0–15.0)
MCH: 26.6 pg (ref 26.0–34.0)
MCHC: 29.8 g/dL — ABNORMAL LOW (ref 30.0–36.0)
MCV: 89.2 fL (ref 78.0–100.0)
PLATELETS: 83 10*3/uL — AB (ref 150–400)
RBC: 3.05 MIL/uL — ABNORMAL LOW (ref 3.87–5.11)
RDW: 18.4 % — ABNORMAL HIGH (ref 11.5–15.5)
WBC: 3.9 10*3/uL — AB (ref 4.0–10.5)

## 2015-11-09 LAB — POCT I-STAT 3, ART BLOOD GAS (G3+)
ACID-BASE EXCESS: 6 mmol/L — AB (ref 0.0–2.0)
Bicarbonate: 27.7 mEq/L — ABNORMAL HIGH (ref 20.0–24.0)
O2 SAT: 99 %
PH ART: 7.595 — AB (ref 7.350–7.450)
Patient temperature: 97.8
TCO2: 29 mmol/L (ref 0–100)
pCO2 arterial: 28.4 mmHg — ABNORMAL LOW (ref 35.0–45.0)
pO2, Arterial: 121 mmHg — ABNORMAL HIGH (ref 80.0–100.0)

## 2015-11-09 LAB — PHOSPHORUS: PHOSPHORUS: 1.8 mg/dL — AB (ref 2.5–4.6)

## 2015-11-09 LAB — TYPE AND SCREEN
ABO/RH(D): O POS
Antibody Screen: POSITIVE
DAT, IGG: POSITIVE

## 2015-11-09 LAB — GLUCOSE, CAPILLARY
GLUCOSE-CAPILLARY: 102 mg/dL — AB (ref 65–99)
GLUCOSE-CAPILLARY: 106 mg/dL — AB (ref 65–99)
GLUCOSE-CAPILLARY: 127 mg/dL — AB (ref 65–99)
GLUCOSE-CAPILLARY: 108 mg/dL — AB (ref 65–99)
Glucose-Capillary: 123 mg/dL — ABNORMAL HIGH (ref 65–99)
Glucose-Capillary: 87 mg/dL (ref 65–99)

## 2015-11-09 LAB — TROPONIN I: TROPONIN I: 0.52 ng/mL — AB (ref <0.031)

## 2015-11-09 LAB — AMMONIA: AMMONIA: 30 umol/L (ref 9–35)

## 2015-11-09 LAB — URINE CULTURE: CULTURE: NO GROWTH

## 2015-11-09 LAB — MAGNESIUM: MAGNESIUM: 1.1 mg/dL — AB (ref 1.7–2.4)

## 2015-11-09 LAB — PROCALCITONIN: PROCALCITONIN: 0.45 ng/mL

## 2015-11-09 MED ORDER — VITAL HIGH PROTEIN PO LIQD
1000.0000 mL | ORAL | Status: DC
  Administered 2015-11-09: 1000 mL
  Administered 2015-11-10: 04:00:00
  Administered 2015-11-10 – 2015-11-11 (×2): 1000 mL
  Administered 2015-11-11: 19:00:00
  Administered 2015-11-11 – 2015-11-12 (×2): 1000 mL
  Administered 2015-11-13: 04:00:00
  Administered 2015-11-13: 1000 mL
  Administered 2015-11-13: 03:00:00
  Administered 2015-11-13: 1000 mL
  Administered 2015-11-13 (×2)
  Administered 2015-11-14 – 2015-11-15 (×2): 1000 mL

## 2015-11-09 MED ORDER — POTASSIUM CHLORIDE 10 MEQ/50ML IV SOLN
10.0000 meq | INTRAVENOUS | Status: AC
  Administered 2015-11-09 (×4): 10 meq via INTRAVENOUS
  Filled 2015-11-09 (×4): qty 50

## 2015-11-09 MED ORDER — FUROSEMIDE 10 MG/ML IJ SOLN
40.0000 mg | Freq: Three times a day (TID) | INTRAMUSCULAR | Status: AC
  Administered 2015-11-09 (×2): 40 mg via INTRAVENOUS
  Filled 2015-11-09 (×2): qty 4

## 2015-11-09 MED ORDER — ACETAZOLAMIDE SODIUM 500 MG IJ SOLR
250.0000 mg | Freq: Four times a day (QID) | INTRAMUSCULAR | Status: AC
  Administered 2015-11-09 (×3): 250 mg via INTRAVENOUS
  Filled 2015-11-09 (×3): qty 500

## 2015-11-09 MED ORDER — DEXTROSE 5 % IV SOLN
30.0000 mmol | Freq: Once | INTRAVENOUS | Status: AC
  Administered 2015-11-09: 30 mmol via INTRAVENOUS
  Filled 2015-11-09: qty 10

## 2015-11-09 MED ORDER — POTASSIUM CHLORIDE 20 MEQ/15ML (10%) PO SOLN
40.0000 meq | Freq: Three times a day (TID) | ORAL | Status: AC
  Administered 2015-11-09 (×2): 40 meq
  Filled 2015-11-09 (×2): qty 30

## 2015-11-09 MED ORDER — MAGNESIUM SULFATE 2 GM/50ML IV SOLN
2.0000 g | Freq: Once | INTRAVENOUS | Status: AC
  Administered 2015-11-09: 2 g via INTRAVENOUS
  Filled 2015-11-09: qty 50

## 2015-11-09 MED ORDER — PANTOPRAZOLE SODIUM 40 MG IV SOLR
40.0000 mg | INTRAVENOUS | Status: DC
  Administered 2015-11-09 – 2015-11-12 (×4): 40 mg via INTRAVENOUS
  Filled 2015-11-09 (×4): qty 40

## 2015-11-09 NOTE — Telephone Encounter (Signed)
Orders received from [facility] 1.) Moderate interaction between pt metoprolol and primidone and 2.) PT resumption orders. Both signed by provider and faxed.

## 2015-11-09 NOTE — Progress Notes (Signed)
PULMONARY / CRITICAL CARE MEDICINE   Name: [name] MRN: [id number] DOB: 1931/05/20    ADMISSION DATE:  11/07/2015 CONSULTATION DATE:  11/08/15  REFERRING MD: Lars Masson, A  CHIEF COMPLAINT:  Altered mental status, hypercarbic resp failure.    SUBJECTIVE: Sedated overnight, but now more arousable and interactive.   VITAL SIGNS: BP 136/72 mmHg  Pulse 53  Temp(Src) 97.6 F (36.4 C) (Oral)  Resp 18  Ht  (1.575 m)  Wt 87.4 kg (192 lb 10.9 oz)  BMI 35.23 kg/m2  SpO2 100%  HEMODYNAMICS:    VENTILATOR SETTINGS: Vent Mode:  [-] PRVC FiO2 (%):  [40 %] 40 % Set Rate:  [16 bmp] 16 bmp Vt Set:  [410 mL-500 mL] 410 mL PEEP:  [5 cmH20] 5 cmH20 Plateau Pressure:  [20 cmH20-24 cmH20] 22 cmH20  INTAKE / OUTPUT: I/O last 3 completed shifts: In: 886.8 [I.V.:261.8; Other:50; NG/GT:75; IV Piggyback:500] Out: 2740 [Urine:2740]  PHYSICAL EXAMINATION: General: Chronically ill appearing elderly female, on vent, no acute distress. Neuro:  Arousable, parkinsonian, moving all ext to commands. HEENT: [place]/AT, PERRL, EOM-I and MMM. Cardiovascular:  RRR, Nl S1/S2, -M/R/G. Lungs:  Diffuse crackles. Abdomen: Soft, NT, ND and +BS. Ext: 2+ edema and -tenderness. Skin: Intact.  LABS:  BMET  Recent Labs Lab 11/07/15 1747 11/07/15 1750 11/08/15 0220 11/09/15 0430  NA 131* 129* 131* 135  K 4.9 4.8 4.7 3.1*  CL 90* 88* 89* 93*  CO2 29  --  31 28  BUN 45* 47* 45* 44*  CREATININE 1.43* 1.50* 1.48* 1.29*  GLUCOSE 110* 107* 93 112*    Electrolytes  Recent Labs Lab 11/07/15 1747 11/08/15 0220 11/09/15 0430  CALCIUM 8.2* 8.4* 8.3*  MG  --   --  1.1*  PHOS  --   --  1.8*    CBC  Recent Labs Lab 11/07/15 1747 11/07/15 1750 11/08/15 0220 11/09/15 0430  WBC 7.9  --  7.8 3.9*  HGB 7.9* 10.5* 8.5* 8.1*  HCT 26.9* 31.0* 28.2* 27.2*  PLT 101*  --  97* 83*   Coag's  Recent Labs Lab 11/07/15 1747  APTT 39*  INR 1.05   Sepsis Markers  Recent Labs Lab  11/08/15 0203 11/08/15 0220 11/08/15 1257 11/09/15 0430  LATICACIDVEN 1.4  --   --   --   PROCALCITON  --  0.39 0.37 0.45   ABG  Recent Labs Lab 11/08/15 1415 11/08/15 1655 11/09/15 0355  PHART 7.320* 7.389 7.595*  PCO2ART 65.0* 53.7* 28.4*  PO2ART 88.8 84.0 121.0*   Liver Enzymes  Recent Labs Lab 11/07/15 1747 11/08/15 0220  AST 64* 64*  ALT 28 29  ALKPHOS 123 131*  BILITOT 0.6 0.9  ALBUMIN 2.9* 3.1*   Cardiac Enzymes  Recent Labs Lab 11/08/15 0220 11/08/15 1257 11/09/15 0430  TROPONINI 0.66* 0.54* 0.52*   Glucose  Recent Labs Lab 11/08/15 0311 11/08/15 1945 11/09/15 0017 11/09/15 0427 11/09/15 0734  GLUCAP 102* 76 87 102* 123*   Imaging Dg Chest Port 1 View  11/09/2015  CLINICAL DATA:  Acute respiratory failure. EXAM: PORTABLE CHEST 1 VIEW COMPARISON:  Radiographs from  10/17/2015 through 11/07/2005 FINDINGS: And right tube and venous catheter NG tube appear in good position. Chronic mid gland. Persistent small left pleural effusion. Pulmonary vascularity is now normal. Minimal bilateral atelectasis stable. IMPRESSION: No significant change. Persistent small left effusion. Slight bilateral atelectasis. Electronically Signed   By: Francene Boyers M.D.   On: 11/09/2015 07:27   Dg Chest Port 1  View  11/08/2015  CLINICAL DATA:  Hypoxia EXAM: PORTABLE CHEST 1 VIEW COMPARISON:  November 08, 2015 study obtained earlier in the day FINDINGS: Endotracheal tube tip is 2.3 cm above the carina. Central catheter tip is in the superior vena cava. Nasogastric tube tip and side port are in the stomach. No pneumothorax. There are small pleural effusions bilaterally. There is airspace consolidation in both bases. There is cardiomegaly with pulmonary venous hypertension. There is atherosclerotic calcification throughout the aorta. Patient is status post coronary artery bypass grafting. No adenopathy. IMPRESSION: Tube and catheter positions as described without pneumothorax.  Cardiomegaly with bilateral effusions and bibasilar airspace consolidation. Suspect a degree of congestive heart failure. Suspect superimposed pneumonia in the bases. Electronically Signed   By: Bretta Bang III M.D.   On: 11/08/2015 18:26    STUDIES:  CT head 2/13. Chronic age-related changes, old infarcts. No acute abnormality. MRI head 2/13. No acute intracranial process Chest x-ray 2/14 bilateral pleural effusions, basilar opacities  CULTURES:  ANTIBIOTICS: Vanc 02/14 > Azactam 02/14 >  SIGNIFICANT EVENTS:   LINES/TUBES: ETT 02/14>>> L IJ TLC 2/14>>>  ASSESSMENT / PLAN:  PULMONARY A: Hypercarbic respiratory failure - did not respond to BiPAP; therefore, moved to ICU in PM of 02/14 for intubation. P:   Begin PS trials but no extubation until more negative fluid balance. Wean as able. Pulmonary hygiene. Diuresis as below. Follow CXR.  CARDIOVASCULAR A:  Coronary artery disease. S/p multiple stents and CABG. P:  Diamox  q6hrs x 3 doses. Lasix 40 mg IV q8 x2 doses.  RENAL A:   AKI. Hyponatremia - volume overloaded. P:   Diuresis as above. Correct electrolytes as indicated. Follow BMP.  GASTROINTESTINAL A:   GI prophylaxis. Nutrition. Ammonia elevated, likely hepatic congestion. P:   SUP: pantoprazole. Consult nutrition for TF as per nutrition. D/C lactulose today. Check ammonia again tomorrow, if rises then will need more lactulose and will likely add to home medications.  HEMATOLOGIC A:   Mild anemia. Thrombocytopenia. VTE prophylaxis. P:  Transfuse for Hgb < 7. Monitor platelet counts. SCD's. CBC in AM.  INFECTIOUS A:   Recent UTI and LLL PNA. P:   Cont vanco and aztreonam. Check cultures, UA.  ENDOCRINE A:   Diabetes mellitus. P:   SSI.  NEUROLOGIC A:   Acute hypercarbic + hepatic encephalopathy - failed BiPAP and required intubation PM of 02/14. H/O old strokes with residual Rt weakness. P:   Sedation: Precedex  gtt / Fentanyl PRN. RASS goal: 0 to -1. Daily WUA.  FAMILY  - Updates: Family updated bedside. - Inter-disciplinary family meet or Palliative Care meeting due by:  2/21.  The patient is critically ill with multiple organ systems failure and requires high complexity decision making for assessment and support, frequent evaluation and titration of therapies, application of advanced monitoring technologies and extensive interpretation of multiple databases.   Critical Care Time devoted to patient care services described in this note is 55 Minutes. This time reflects time of care of this signee Dr [name]. This critical care time does not reflect procedure time, or teaching time or supervisory time of PA/NP/Med student/Med Resident etc but could involve care discussion time.  Alyson Reedy, M.D. [facility] Pulmonary/Critical Care Medicine. Pager: [phone]. After hours pager: [phone].

## 2015-11-09 NOTE — Progress Notes (Signed)
Bedside EEG completed, results pending. 

## 2015-11-09 NOTE — Clinical Documentation Improvement (Signed)
Internal Medicine  Abnormal Lab/Test Results:  11/08/15: K= 4.7; 11/09/15: K= 3.1  Possible Clinical Conditions associated with below indicators  Hypokalemia  Other Condition  Cannot Clinically Determine   Treatment Provided: KCL 40 mEq TID   Please exercise your independent, professional judgment when responding. A specific answer is not anticipated or expected.   Thank You,  Cherylann Ratel, RN, BSN [facility] Information Management [place] [phone]

## 2015-11-09 NOTE — Progress Notes (Signed)
Patient intubated and further medical management per critical care team. Discussed with critical care team MD.   Once medical condition improves please reach out to flow manager so that further medical management can be resumed by our team if so desired.  Flow manager: [id number]  Penny Pia MD

## 2015-11-09 NOTE — Procedures (Signed)
ELECTROENCEPHALOGRAM REPORT  Date of Study: 11/09/2015  Patient's Name: [name] MRN: [id number] Date of Birth: [birth year]  Referring Provider: Dr. Midge Minium  Clinical History: This is an 82 year old woman with altered mental status.  Medications: dexmedetomidine (PRECEDEX) 400 MCG/100ML (4 mcg/mL) infusion acetaminophen (TYLENOL) tablet 650 mg albuterol (PROVENTIL) (2.5 MG/3ML) 0.083% nebulizer solution 2.5 mg aspirin tablet 325 mg aztreonam (AZACTAM) 1 g in dextrose 5 % 50 mL IVPB furosemide (LASIX) injection 40 mg hydrALAZINE (APRESOLINE) injection 10 mg vancomycin (VANCOCIN) 1,250 mg in sodium chloride 0.9 % 250 mL IVPB  Technical Summary: A multichannel digital EEG recording measured by the international 10-20 system with electrodes applied with paste and impedances below 5000 ohms performed as portable with EKG monitoring in an intubated and sedated patient.  Hyperventilation and photic stimulation were not performed.  The digital EEG was referentially recorded, reformatted, and digitally filtered in a variety of bipolar and referential montages for optimal display.   Description: The patient is intubated and sedated on Precedex at the beginning of the recording. Precedex is turned off 5 minutes into the study. There is no clear posterior dominant rhythm. The background initially shows sleep architecture with theta and delta slowing, poorly formed low voltage vertex waves and sleep spindles are seen. With noxious stimulation, there is an increase in faster frequencies with diffuse 4-5 Hz theta slowing and muscle artifact noted. Hyperventilation and photic stimulation were not performed.  There were no epileptiform discharges or electrographic seizures seen.    EKG lead showed bradycardia with occasional extrasystolic beats.  Impression: This EEG is abnormal due to mild to moderate diffuse slowing of the background.  Clinical Correlation of the above findings  indicates diffuse cerebral dysfunction that is non-specific in etiology and can be seen with hypoxic/ischemic injury, toxic/metabolic encephalopathies, or medication effect.  The absence of epileptiform discharges does not rule out a clinical diagnosis of epilepsy.  Clinical correlation is advised.   Patrcia Dolly, M.D.

## 2015-11-09 NOTE — Progress Notes (Signed)
eLink Physician-Brief Progress Note Patient Name: [name] DOB: 1930-09-26 MRN: [id number]   Date of Service  11/09/2015  HPI/Events of Note  K+ = 3.1 and Creatinine = 1.29.  eICU Interventions  Will order: 1. Replete K+. 2. Re-check BMP at 12 noon.     Intervention Category Intermediate Interventions: Electrolyte abnormality - evaluation and management  Aarush Stukey Eugene 11/09/2015, 6:03 AM

## 2015-11-09 NOTE — Progress Notes (Signed)
[facility] ADULT ICU REPLACEMENT PROTOCOL FOR AM LAB REPLACEMENT ONLY  The patient does not apply for the [facility] Adult ICU Electrolyte Replacment Protocol based on the criteria listed below:   1. Is GFR >/= 40 ml/min? No.  Patient's GFR today is 37    4. Abnormal electrolyte(s): K3.1 5. Ordered repletion with: MD to replace 6. If a panic level lab has been reported, has the CCM MD in charge been notified? Yes.  .   Physician:  Laural Benes, MD  Melrose Nakayama 11/09/2015 5:56 AM

## 2015-11-09 NOTE — Progress Notes (Signed)
Initial Nutrition Assessment  DOCUMENTATION CODES:   Obesity unspecified  INTERVENTION:   Initiate Vital High Protein @ 20 ml/hr via OG tube and increase by 10 ml every 12 hours to goal rate of 50 ml/hr.   Tube feeding regimen provides 1200 kcal, 105 grams of protein, and 1003 ml of H2O.   NUTRITION DIAGNOSIS:   Inadequate oral intake related to inability to eat as evidenced by NPO status.  GOAL:   Provide needs based on ASPEN/SCCM guidelines  MONITOR:   Vent status, I & O's, TF tolerance  REASON FOR ASSESSMENT:   Consult Enteral/tube feeding initiation and management  ASSESSMENT:   [place] female with hx of DM, stroke, HTN, CHF (EF 50%) was admitted with encephalopathy. Pt recently hospitalized at [facility] for abd pain and dx with UTI.    2/14 failed BiPAP and intubated  MV: 6.5 L/min Temp (24hrs), Avg:97.7 F (36.5 C), Min:97.4 F (36.3 C), Max:98 F (36.7 C)  OG tube  Medications reviewed and include: magnesium sulfate, potassium chloride, sodium phosphate Labs reviewed: potassium low 3.1, phosphorus and magnesium low 1.8/1.4 Will advance TF slowly due to electrolytes.   Diet Order:  Diet NPO time specified  Skin:  Reviewed, no issues  Last BM:  2/13  Height:   Ht Readings from Last 1 Encounters:  11/08/15  (1.575 m)    Weight:   Wt Readings from Last 1 Encounters:  11/09/15 192 lb 10.9 oz (87.4 kg)    Ideal Body Weight:  50 kg  BMI:  Body mass index is 35.23 kg/(m^2).  Estimated Nutritional Needs:   Kcal:  960-4540  Protein:  > 100 grams  Fluid:  > 1.5 L/day  EDUCATION NEEDS:   No education needs identified at this time  Kendell Bane RD, LDN, CNSC [phone] Pager [phone] After Hours Pager

## 2015-11-10 ENCOUNTER — Inpatient Hospital Stay (HOSPITAL_COMMUNITY): Payer: Medicare Other

## 2015-11-10 LAB — GLUCOSE, CAPILLARY
GLUCOSE-CAPILLARY: 107 mg/dL — AB (ref 65–99)
GLUCOSE-CAPILLARY: 122 mg/dL — AB (ref 65–99)
GLUCOSE-CAPILLARY: 173 mg/dL — AB (ref 65–99)
GLUCOSE-CAPILLARY: 89 mg/dL (ref 65–99)
GLUCOSE-CAPILLARY: 91 mg/dL (ref 65–99)
Glucose-Capillary: 96 mg/dL (ref 65–99)
Glucose-Capillary: 109 mg/dL — ABNORMAL HIGH (ref 65–99)
Glucose-Capillary: 119 mg/dL — ABNORMAL HIGH (ref 65–99)
Glucose-Capillary: 159 mg/dL — ABNORMAL HIGH (ref 65–99)

## 2015-11-10 LAB — CBC
HEMATOCRIT: 26 % — AB (ref 36.0–46.0)
Hemoglobin: 7.9 g/dL — ABNORMAL LOW (ref 12.0–15.0)
MCH: 26.8 pg (ref 26.0–34.0)
MCHC: 30.4 g/dL (ref 30.0–36.0)
MCV: 88.1 fL (ref 78.0–100.0)
PLATELETS: 77 10*3/uL — AB (ref 150–400)
RBC: 2.95 MIL/uL — AB (ref 3.87–5.11)
RDW: 19.2 % — AB (ref 11.5–15.5)
WBC: 4.2 10*3/uL (ref 4.0–10.5)

## 2015-11-10 LAB — POCT I-STAT 3, ART BLOOD GAS (G3+)
ACID-BASE EXCESS: 3 mmol/L — AB (ref 0.0–2.0)
Bicarbonate: 25.9 mEq/L — ABNORMAL HIGH (ref 20.0–24.0)
O2 SAT: 100 %
Patient temperature: 97.2
TCO2: 27 mmol/L (ref 0–100)
pCO2 arterial: 31.1 mmHg — ABNORMAL LOW (ref 35.0–45.0)
pH, Arterial: 7.526 — ABNORMAL HIGH (ref 7.350–7.450)
pO2, Arterial: 144 mmHg — ABNORMAL HIGH (ref 80.0–100.0)

## 2015-11-10 LAB — BASIC METABOLIC PANEL
ANION GAP: 14 (ref 5–15)
BUN: 42 mg/dL — ABNORMAL HIGH (ref 6–20)
CALCIUM: 8.2 mg/dL — AB (ref 8.9–10.3)
CO2: 28 mmol/L (ref 22–32)
Chloride: 98 mmol/L — ABNORMAL LOW (ref 101–111)
Creatinine, Ser: 1.18 mg/dL — ABNORMAL HIGH (ref 0.44–1.00)
GFR, EST AFRICAN AMERICAN: 48 mL/min — AB (ref >60)
GFR, EST NON AFRICAN AMERICAN: 42 mL/min — AB (ref >60)
Glucose, Bld: 175 mg/dL — ABNORMAL HIGH (ref 65–99)
Potassium: 4.3 mmol/L (ref 3.5–5.1)
Sodium: 140 mmol/L (ref 135–145)

## 2015-11-10 LAB — PHOSPHORUS: PHOSPHORUS: 4.5 mg/dL (ref 2.5–4.6)

## 2015-11-10 LAB — AMMONIA: AMMONIA: 29 umol/L (ref 9–35)

## 2015-11-10 LAB — PROCALCITONIN: Procalcitonin: 0.51 ng/mL

## 2015-11-10 LAB — MAGNESIUM: Magnesium: 1.6 mg/dL — ABNORMAL LOW (ref 1.7–2.4)

## 2015-11-10 MED ORDER — POLYETHYLENE GLYCOL 3350 17 G PO PACK
17.0000 g | PACK | Freq: Every day | ORAL | Status: DC
  Administered 2015-11-10 – 2015-11-18 (×9): 17 g via ORAL
  Filled 2015-11-10 (×8): qty 1

## 2015-11-10 MED ORDER — SENNOSIDES-DOCUSATE SODIUM 8.6-50 MG PO TABS
2.0000 | ORAL_TABLET | Freq: Two times a day (BID) | ORAL | Status: DC
  Administered 2015-11-10 – 2015-11-18 (×17): 2 via ORAL
  Filled 2015-11-10 (×17): qty 2

## 2015-11-10 MED ORDER — DOPAMINE-DEXTROSE 3.2-5 MG/ML-% IV SOLN
5.0000 ug/kg/min | INTRAVENOUS | Status: DC
  Administered 2015-11-10: 5 ug/kg/min via INTRAVENOUS
  Filled 2015-11-10: qty 250

## 2015-11-10 MED ORDER — ACETAZOLAMIDE SODIUM 500 MG IJ SOLR
250.0000 mg | Freq: Four times a day (QID) | INTRAMUSCULAR | Status: AC
  Administered 2015-11-10 – 2015-11-11 (×3): 250 mg via INTRAVENOUS
  Filled 2015-11-10 (×3): qty 500

## 2015-11-10 MED ORDER — FUROSEMIDE 10 MG/ML IJ SOLN
10.0000 mg/h | INTRAVENOUS | Status: DC
  Administered 2015-11-10: 8 mg/h via INTRAVENOUS
  Filled 2015-11-10 (×2): qty 25

## 2015-11-10 MED ORDER — FREE WATER
250.0000 mL | Freq: Four times a day (QID) | Status: DC
  Administered 2015-11-10 – 2015-11-11 (×4): 250 mL

## 2015-11-10 MED ORDER — METOLAZONE 5 MG PO TABS
5.0000 mg | ORAL_TABLET | Freq: Every day | ORAL | Status: AC
  Administered 2015-11-10: 5 mg via ORAL
  Filled 2015-11-10: qty 1

## 2015-11-10 MED ORDER — MAGNESIUM SULFATE 2 GM/50ML IV SOLN
2.0000 g | Freq: Once | INTRAVENOUS | Status: AC
  Administered 2015-11-10: 2 g via INTRAVENOUS
  Filled 2015-11-10: qty 50

## 2015-11-10 MED ORDER — PRIMIDONE 50 MG PO TABS
50.0000 mg | ORAL_TABLET | Freq: Every day | ORAL | Status: DC
  Administered 2015-11-10 – 2015-11-24 (×14): 50 mg via ORAL
  Filled 2015-11-10 (×15): qty 1

## 2015-11-10 NOTE — Progress Notes (Addendum)
PULMONARY / CRITICAL CARE MEDICINE   Name: [name] MRN: [id number] DOB: [birth year]    ADMISSION DATE:  11/07/2015 CONSULTATION DATE:  11/08/15  REFERRING MD: Lars Masson, A  CHIEF COMPLAINT:  Altered mental status, hypercarbic resp failure.    SUBJECTIVE: Sedated overnight, but now more arousable and interactive.   VITAL SIGNS: BP 119/44 mmHg  Pulse 103  Temp(Src) 97.6 F (36.4 C) (Oral)  Resp 29  Ht  (1.575 m)  Wt 87.1 kg (192 lb 0.3 oz)  BMI 35.11 kg/m2  SpO2 100%  HEMODYNAMICS:    VENTILATOR SETTINGS: Vent Mode:  [-] PSV FiO2 (%):  [40 %] 40 % Set Rate:  [14 bmp] 14 bmp Vt Set:  [400 mL] 400 mL PEEP:  [5 cmH20] 5 cmH20 Pressure Support:  [10 cmH20] 10 cmH20 Plateau Pressure:  [19 cmH20-22 cmH20] 19 cmH20  INTAKE / OUTPUT: I/O last 3 completed shifts: In: 2040.5 [I.V.:595.2; NG/GT:435.3; IV Piggyback:1010] Out: 3910 [Urine:3910]  PHYSICAL EXAMINATION: General: Chronically ill appearing elderly female, on vent, no acute distress. Neuro:  Arousable, parkinsonian, moving all ext to commands. HEENT: [place]/AT, PERRL, EOM-I and MMM. Cardiovascular:  RRR, Nl S1/S2, -M/R/G. Lungs:  Diffuse crackles. Abdomen: Soft, NT, ND and +BS. Ext: 2+ edema and -tenderness. Skin: Intact.  LABS:  BMET  Recent Labs Lab 11/09/15 0430 11/09/15 1200 11/10/15 0415  NA 135 136 140  K 3.1* 3.7 4.3  CL 93* 94* 98*  CO2 BUN 44* 42* 42*  CREATININE 1.29* 1.30* 1.18*  GLUCOSE 112* 117* 175*    Electrolytes  Recent Labs Lab 11/09/15 0430 11/09/15 1200 11/10/15 0415  CALCIUM 8.3* 8.3* 8.2*  MG 1.1*  --  1.6*  PHOS 1.8*  --  4.5    CBC  Recent Labs Lab 11/08/15 0220 11/09/15 0430 11/10/15 0415  WBC 7.8 3.9* 4.2  HGB 8.5* 8.1* 7.9*  HCT 28.2* 27.2* 26.0*  PLT 97* 83* 77*   Coag's  Recent Labs Lab 11/07/15 1747  APTT 39*  INR 1.05   Sepsis Markers  Recent Labs Lab 11/08/15 0203  11/08/15 1257 11/09/15 0430 11/10/15 0415   LATICACIDVEN 1.4  --   --   --   --   PROCALCITON  --   < > 0.37 0.45 0.51  < > = values in this interval not displayed. ABG  Recent Labs Lab 11/08/15 1655 11/09/15 0355 11/10/15 0403  PHART 7.389 7.595* 7.526*  PCO2ART 53.7* 28.4* 31.1*  PO2ART 84.0 121.0* 144.0*   Liver Enzymes  Recent Labs Lab 11/07/15 1747 11/08/15 0220  AST 64* 64*  ALT 28 29  ALKPHOS 123 131*  BILITOT 0.6 0.9  ALBUMIN 2.9* 3.1*   Cardiac Enzymes  Recent Labs Lab 11/08/15 0220 11/08/15 1257 11/09/15 0430  TROPONINI 0.66* 0.54* 0.52*   Glucose  Recent Labs Lab 11/09/15 1135 11/09/15 1614 11/09/15 1954 11/09/15 2357 11/10/15 0402 11/10/15 0916  GLUCAP 106* 127* 108* 122* 173* 109*   Imaging Dg Chest Port 1 View  11/10/2015  CLINICAL DATA:  Intubated patient, acute respiratory failure, acute and chronic CHF EXAM: PORTABLE CHEST 1 VIEW COMPARISON:  Portable chest x-ray of November 09, 2015 FINDINGS: The lungs are adequately inflated. The hemidiaphragms remain obscured. The pulmonary interstitial markings are slightly less conspicuous today. The cardiac silhouette remains enlarged. The central pulmonary vascularity remains engorged but is more distinct today. The endotracheal tube tip lies 1 cm or less above the carina. The esophagogastric tube tip projects below the inferior margin  of the image with the proximal at or below the GE junction. The left internal jugular venous catheter tip projects over junction of the proximal and midportions of the SVC. The patient has undergone previous CABG. The sternal wires and bands appear intact. A stable density consistent with a right coronary artery stent is observed. IMPRESSION: 1. Slight interval improvement in pulmonary vascular congestion and interstitial edema. Persistent bibasilar atelectasis and small pleural effusions. 2. The endotracheal tube tip lies approximately 1 cm above the carina. Withdrawal by 2-3 cm is recommended. Critical Value/emergent  results were called by telephone at the time of interpretation on 11/10/2015 at 7:20 am to Exie Parody, RN,, who verbally acknowledged these results. Electronically Signed   By: David  [place] M.D.   On: 11/10/2015 07:21    STUDIES:  CT head 2/13. Chronic age-related changes, old infarcts. No acute abnormality. MRI head 2/13. No acute intracranial process Chest x-ray 2/14 bilateral pleural effusions, basilar opacities  CULTURES:  ANTIBIOTICS: Vanc 02/14 > Azactam 02/14 >  SIGNIFICANT EVENTS:   LINES/TUBES: ETT 02/14>>> L IJ TLC 2/14>>>  ASSESSMENT / PLAN:  PULMONARY A: Hypercarbic respiratory failure - did not respond to BiPAP; therefore, moved to ICU in PM of 02/14 for intubation. P:   Continue PS trials, will need volume negative prior to serious consideration for extubation. Wean as able. Pulmonary hygiene. Diuresis as below. Follow CXR.  CARDIOVASCULAR A:  Coronary artery disease. S/p multiple stents and CABG. P:  Diamox  q6hrs x 3 doses. Lasix drip at 8 mg/hr. Zaroxolyn 5 mg PO x1.  RENAL A:   AKI. Hyponatremia - volume overloaded. P:   Diuresis as above. Correct electrolytes as indicated. Follow BMP. Add free water to avoid hypernatremia.  GASTROINTESTINAL A:   GI prophylaxis. Nutrition. Ammonia elevated, likely hepatic congestion. P:   SUP: pantoprazole. TF per nutrition. D/C lactulose today. Ammonia level did not rise off lactulose, will not restart, Senakot 2 tablets BID. Miralax once a day.   HEMATOLOGIC A:   Mild anemia. Thrombocytopenia. VTE prophylaxis. P:  Transfuse for Hgb < 7. Monitor platelet counts. SCD's. CBC in AM.  INFECTIOUS A:   Recent UTI and LLL PNA. P:   Cultures negative, will d/c vanc. Continue aztreonam.  ENDOCRINE A:   Diabetes mellitus. P:   SSI.  NEUROLOGIC A:   Acute hypercarbic + hepatic encephalopathy - failed BiPAP and required intubation PM of 02/14. H/O old strokes with residual Rt  weakness. P:   Sedation: Precedex gtt / Fentanyl PRN. RASS goal: 0 to -1. Daily WUA. Restart Primidone for parkinson.  FAMILY  - Updates: Family updated bedside. - Inter-disciplinary family meet or Palliative Care meeting due by:  2/21.  The patient is critically ill with multiple organ systems failure and requires high complexity decision making for assessment and support, frequent evaluation and titration of therapies, application of advanced monitoring technologies and extensive interpretation of multiple databases.   Critical Care Time devoted to patient care services described in this note is 55 Minutes. This time reflects time of care of this signee Dr [name]. This critical care time does not reflect procedure time, or teaching time or supervisory time of PA/NP/Med student/Med Resident etc but could involve care discussion time.  Alyson Reedy, M.D. [facility] Pulmonary/Critical Care Medicine. Pager: [phone]. After hours pager: [phone].

## 2015-11-10 NOTE — Care Management Note (Signed)
Case Management Note  Patient Details  Name: [name] MRN: [id number] Date of Birth: [birth year]  Subjective/Objective:      Adm w encephalopathy   ,vent  Action/Plan:lives w fam, pcp dr Beverely Low   Expected Discharge Date:                  Expected Discharge Plan:  Home w Home Health Services  In-House Referral:     Discharge planning Services  CM Consult  Post Acute Care Choice:  Resumption of Svcs/PTA Provider Choice offered to:     DME Arranged:    DME Agency:     HH Arranged:  RN, PT, OT HH Agency:  [facility]  Status of Service:     Medicare Important Message Given:    Date Medicare IM Given:    Medicare IM give by:    Date Additional Medicare IM Given:    Additional Medicare Important Message give by:     If discussed at Long Length of Stay Meetings, dates discussed:    Additional Comments:act w [facility] for [id number]  Hanley Hays, RN 11/10/2015, 10:19 AM

## 2015-11-11 ENCOUNTER — Inpatient Hospital Stay (HOSPITAL_COMMUNITY): Payer: Medicare Other

## 2015-11-11 LAB — BLOOD GAS, ARTERIAL
Acid-Base Excess: 2.9 mmol/L — ABNORMAL HIGH (ref 0.0–2.0)
BICARBONATE: 26.2 meq/L — AB (ref 20.0–24.0)
Drawn by: 441661
FIO2: 0.4
LHR: 10 {breaths}/min
MECHVT: 400 mL
O2 Saturation: 99.2 %
PEEP: 5 cmH2O
PO2 ART: 140 mmHg — AB (ref 80.0–100.0)
Patient temperature: 98.9
TCO2: 27.3 mmol/L (ref 0–100)
pCO2 arterial: 35.6 mmHg (ref 35.0–45.0)
pH, Arterial: 7.482 — ABNORMAL HIGH (ref 7.350–7.450)

## 2015-11-11 LAB — BASIC METABOLIC PANEL
Anion gap: 13 (ref 5–15)
Anion gap: 12 (ref 5–15)
BUN: 45 mg/dL — ABNORMAL HIGH (ref 6–20)
BUN: 48 mg/dL — ABNORMAL HIGH (ref 6–20)
CALCIUM: 8 mg/dL — AB (ref 8.9–10.3)
CHLORIDE: 94 mmol/L — AB (ref 101–111)
CO2: 27 mmol/L (ref 22–32)
CO2: 27 mmol/L (ref 22–32)
CREATININE: 1.09 mg/dL — AB (ref 0.44–1.00)
CREATININE: 1.13 mg/dL — AB (ref 0.44–1.00)
Calcium: 7.7 mg/dL — ABNORMAL LOW (ref 8.9–10.3)
Chloride: 95 mmol/L — ABNORMAL LOW (ref 101–111)
GFR calc Af Amer: 51 mL/min — ABNORMAL LOW (ref >60)
GFR calc non Af Amer: 44 mL/min — ABNORMAL LOW (ref >60)
GFR, EST AFRICAN AMERICAN: 53 mL/min — AB (ref >60)
GFR, EST NON AFRICAN AMERICAN: 46 mL/min — AB (ref >60)
GLUCOSE: 145 mg/dL — AB (ref 65–99)
Glucose, Bld: 157 mg/dL — ABNORMAL HIGH (ref 65–99)
POTASSIUM: 2.5 mmol/L — AB (ref 3.5–5.1)
Potassium: 5.2 mmol/L — ABNORMAL HIGH (ref 3.5–5.1)
SODIUM: 134 mmol/L — AB (ref 135–145)
Sodium: 134 mmol/L — ABNORMAL LOW (ref 135–145)

## 2015-11-11 LAB — CBC
HCT: 22.5 % — ABNORMAL LOW (ref 36.0–46.0)
HEMOGLOBIN: 7.1 g/dL — AB (ref 12.0–15.0)
MCH: 27.6 pg (ref 26.0–34.0)
MCHC: 31.6 g/dL (ref 30.0–36.0)
MCV: 87.5 fL (ref 78.0–100.0)
Platelets: 82 10*3/uL — ABNORMAL LOW (ref 150–400)
RBC: 2.57 MIL/uL — AB (ref 3.87–5.11)
RDW: 19.5 % — ABNORMAL HIGH (ref 11.5–15.5)
WBC: 4.9 10*3/uL (ref 4.0–10.5)

## 2015-11-11 LAB — GLUCOSE, CAPILLARY
GLUCOSE-CAPILLARY: 129 mg/dL — AB (ref 65–99)
GLUCOSE-CAPILLARY: 153 mg/dL — AB (ref 65–99)
GLUCOSE-CAPILLARY: 154 mg/dL — AB (ref 65–99)
Glucose-Capillary: 117 mg/dL — ABNORMAL HIGH (ref 65–99)
Glucose-Capillary: 162 mg/dL — ABNORMAL HIGH (ref 65–99)
Glucose-Capillary: 163 mg/dL — ABNORMAL HIGH (ref 65–99)

## 2015-11-11 LAB — PHOSPHORUS: PHOSPHORUS: 3.9 mg/dL (ref 2.5–4.6)

## 2015-11-11 LAB — MAGNESIUM: MAGNESIUM: 1.8 mg/dL (ref 1.7–2.4)

## 2015-11-11 MED ORDER — METOLAZONE 5 MG PO TABS
10.0000 mg | ORAL_TABLET | Freq: Every day | ORAL | Status: AC
  Administered 2015-11-11: 10 mg via ORAL
  Filled 2015-11-11: qty 2

## 2015-11-11 MED ORDER — DOPAMINE-DEXTROSE 3.2-5 MG/ML-% IV SOLN: 5.0000 ug/kg/min | INTRAVENOUS | Status: DC

## 2015-11-11 MED ORDER — POTASSIUM CHLORIDE 20 MEQ/15ML (10%) PO SOLN
40.0000 meq | Freq: Three times a day (TID) | ORAL | Status: AC
  Administered 2015-11-11: 40 meq
  Filled 2015-11-11: qty 30

## 2015-11-11 MED ORDER — MAGNESIUM SULFATE 2 GM/50ML IV SOLN
2.0000 g | Freq: Once | INTRAVENOUS | Status: AC
  Administered 2015-11-11: 2 g via INTRAVENOUS
  Filled 2015-11-11: qty 50

## 2015-11-11 MED ORDER — POTASSIUM CHLORIDE 10 MEQ/50ML IV SOLN: 10.0000 meq | INTRAVENOUS | Status: DC

## 2015-11-11 MED ORDER — FUROSEMIDE 10 MG/ML IJ SOLN
10.0000 mg/h | INTRAVENOUS | Status: AC
  Administered 2015-11-11: 8 mg/h via INTRAVENOUS
  Filled 2015-11-11: qty 25

## 2015-11-11 MED ORDER — ACETAZOLAMIDE SODIUM 500 MG IJ SOLR
250.0000 mg | Freq: Four times a day (QID) | INTRAMUSCULAR | Status: AC
  Administered 2015-11-11 (×3): 250 mg via INTRAVENOUS
  Filled 2015-11-11 (×3): qty 500

## 2015-11-11 MED ORDER — POTASSIUM CHLORIDE 10 MEQ/50ML IV SOLN
10.0000 meq | INTRAVENOUS | Status: AC
  Administered 2015-11-11 (×6): 10 meq via INTRAVENOUS
  Filled 2015-11-11 (×6): qty 50

## 2015-11-11 NOTE — Progress Notes (Signed)
Critical K 2.5 called to Dr. Levada Schilling. New orders obtained.

## 2015-11-11 NOTE — Progress Notes (Signed)
eLink Physician-Brief Progress Note Patient Name: [name] DOB: 1930-09-26 MRN: [id number]   Date of Service  11/11/2015  HPI/Events of Note  K+ = 2.5 and Creatinine = 1.09. Patient is currently on a Lasix IV infusion.   eICU Interventions  Will order: 1. Replete K+. 2. Repeat BMP at 1 PM.     Intervention Category Major Interventions: Electrolyte abnormality - evaluation and management  Danicia Terhaar Eugene 11/11/2015, 5:21 AM

## 2015-11-11 NOTE — Progress Notes (Signed)
PULMONARY / CRITICAL CARE MEDICINE   Name: [name] MRN: [id number] DOB: 03-14-1931    ADMISSION DATE:  11/07/2015 CONSULTATION DATE:  11/08/15  REFERRING MD: Lars Masson, A  CHIEF COMPLAINT:  Altered mental status, hypercarbic resp failure.    SUBJECTIVE: No events overnight, more awake this AM.  VITAL SIGNS: BP 130/48 mmHg  Pulse 88  Temp(Src) 99.4 F (37.4 C) (Oral)  Resp 29  Ht  (1.575 m)  Wt 91 kg (200 lb 9.9 oz)  BMI 36.68 kg/m2  SpO2 100%  HEMODYNAMICS:    VENTILATOR SETTINGS: Vent Mode:  [-] PSV FiO2 (%):  [40 %] 40 % Set Rate:  [10 bmp] 10 bmp Vt Set:  [400 mL] 400 mL PEEP:  [5 cmH20] 5 cmH20 Pressure Support:  [15 cmH20] 15 cmH20 Plateau Pressure:  [16 cmH20-27 cmH20] 16 cmH20  INTAKE / OUTPUT: I/O last 3 completed shifts: In: 3272.1 [I.V.:809.6; NG/GT:2162.5; IV Piggyback:300] Out: 3200 [Urine:3200]  PHYSICAL EXAMINATION: General: Chronically ill appearing elderly female, on vent, alert and interactive. Neuro:  Arousable, parkinsonian, moving all ext to commands. HEENT: [place]/AT, PERRL, EOM-I and MMM. Cardiovascular:  RRR, Nl S1/S2, -M/R/G. Lungs:  Diffuse crackles. Abdomen: Soft, NT, ND and +BS. Ext: 2+ edema and -tenderness. Skin: Intact.  LABS:  BMET  Recent Labs Lab 11/09/15 1200 11/10/15 0415 11/11/15 0435  NA 136 140 134*  K 3.7 4.3 2.5*  CL 94* 98* 94*  CO2 BUN 42* 42* 45*  CREATININE 1.30* 1.18* 1.09*  GLUCOSE 117* 175* 157*   Electrolytes  Recent Labs Lab 11/09/15 0430 11/09/15 1200 11/10/15 0415 11/11/15 0435  CALCIUM 8.3* 8.3* 8.2* 7.7*  MG 1.1*  --  1.6* 1.8  PHOS 1.8*  --  4.5 3.9   CBC  Recent Labs Lab 11/09/15 0430 11/10/15 0415 11/11/15 0435  WBC 3.9* 4.2 4.9  HGB 8.1* 7.9* 7.1*  HCT 27.2* 26.0* 22.5*  PLT 83* 77* 82*   Coag's  Recent Labs Lab 11/07/15 1747  APTT 39*  INR 1.05   Sepsis Markers  Recent Labs Lab 11/08/15 0203  11/08/15 1257 11/09/15 0430  11/10/15 0415  LATICACIDVEN 1.4  --   --   --   --   PROCALCITON  --   < > 0.37 0.45 0.51  < > = values in this interval not displayed. ABG  Recent Labs Lab 11/09/15 0355 11/10/15 0403 11/11/15 0333  PHART 7.595* 7.526* 7.482*  PCO2ART 28.4* 31.1* 35.6  PO2ART 121.0* 144.0* 140*   Liver Enzymes  Recent Labs Lab 11/07/15 1747 11/08/15 0220  AST 64* 64*  ALT 28 29  ALKPHOS 123 131*  BILITOT 0.6 0.9  ALBUMIN 2.9* 3.1*   Cardiac Enzymes  Recent Labs Lab 11/08/15 0220 11/08/15 1257 11/09/15 0430  TROPONINI 0.66* 0.54* 0.52*   Glucose  Recent Labs Lab 11/10/15 1105 11/10/15 1629 11/10/15 1952 11/11/15 0025 11/11/15 0419 11/11/15 0736  GLUCAP 107* 119* 159* 129* 154* 153*   Imaging Dg Chest Port 1 View  11/11/2015  CLINICAL DATA:  Hypoxia EXAM: PORTABLE CHEST 1 VIEW COMPARISON:  November 10, 2015 FINDINGS: Endotracheal tube tip is 7 mm above the carina. Nasogastric tube tip and side port are in the stomach. Central catheter tip is in the superior vena cava. No pneumothorax. There is interstitial edema with small pleural effusions. There is patchy airspace consolidation in both lung bases. Heart is enlarged with pulmonary venous hypertension. Patient is status post coronary artery bypass grafting. There is atherosclerotic calcification  in the aorta. IMPRESSION: Tube and catheter positions as described without pneumothorax. Note that the endotracheal tube tip is near the carina; advise withdrawing endotracheal tube approximately 3 cm. Evidence of congestive heart failure. Opacity in the lung bases may represent alveolar edema but also could represent superimposed pneumonia. Both congestive heart failure and pneumonia may present concurrently. Electronically Signed   By: Bretta Bang III M.D.   On: 11/11/2015 07:55    STUDIES:  CT head 2/13. Chronic age-related changes, old infarcts. No acute abnormality. MRI head 2/13. No acute intracranial process Chest x-ray  2/14 bilateral pleural effusions, basilar opacities  CULTURES:  ANTIBIOTICS: Vanc 02/14 >2/16 Azactam 02/14 >2/21  SIGNIFICANT EVENTS:   LINES/TUBES: ETT 02/14>>> L IJ TLC 2/14>>>  ASSESSMENT / PLAN:  PULMONARY A: Hypercarbic respiratory failure - did not respond to BiPAP; therefore, moved to ICU in PM of 02/14 for intubation. P:   SBT today. Wean as able. Pulmonary hygiene. Diuresis as below. Follow CXR.  CARDIOVASCULAR A:  Coronary artery disease. S/p multiple stents and CABG. P:  Diamox  q6hrs x 3 doses. Lasix drip at 8 mg/hr. Zaroxolyn 10 mg PO x1. If RVR recurs will need amiodarone drip. D/C dopamine.  RENAL A:   AKI. Hyponatremia - volume overloaded. P:   Diuresis as above. Correct electrolytes as indicated. Follow BMP. D/C free water.  GASTROINTESTINAL A:   GI prophylaxis. Nutrition. Ammonia elevated, likely hepatic congestion. P:   SUP: pantoprazole. TF per nutrition. Ammonia level did not rise off lactulose, will not restart, Senakot 2 tablets BID. Miralax once a day.   HEMATOLOGIC A:   Mild anemia. Thrombocytopenia. VTE prophylaxis. P:  Transfuse for Hgb < 7. Monitor platelet counts. SCD's. CBC in AM.  INFECTIOUS A:   Recent UTI and LLL PNA. P:   Cultures negative, d/c vanc. Continue aztreonam, total of 8 days.  ENDOCRINE A:   Diabetes mellitus. P:   SSI.  NEUROLOGIC A:   Acute hypercarbic + hepatic encephalopathy - failed BiPAP and required intubation PM of 02/14. H/O old strokes with residual Rt weakness. P:   Sedation: Precedex gtt / Fentanyl PRN. RASS goal: 0 to -1. Daily WUA. Continue Primidone for parkinson.  FAMILY  - Updates: Family updated bedside. - Inter-disciplinary family meet or Palliative Care meeting due by:  2/21.  The patient is critically ill with multiple organ systems failure and requires high complexity decision making for assessment and support, frequent evaluation and titration of  therapies, application of advanced monitoring technologies and extensive interpretation of multiple databases.   Critical Care Time devoted to patient care services described in this note is 55 Minutes. This time reflects time of care of this signee Dr [name]. This critical care time does not reflect procedure time, or teaching time or supervisory time of PA/NP/Med student/Med Resident etc but could involve care discussion time.  Alyson Reedy, M.D. [facility] Pulmonary/Critical Care Medicine. Pager: [phone]. After hours pager: [phone].

## 2015-11-11 NOTE — Progress Notes (Signed)
Pharmacy Antibiotic Note  [name] is a 82 y.o. female admitted on 11/07/2015 with LLL pneumonia. She continues on day #4 aztreonam. Renal function improved. Dose remains appropriate. Noted plan for 8 days of therapy total.  Plan: 1) Continue aztreonam 1g IV q8 2) Will add stop date for 2/21  Height:  (157.5 cm) Weight: 200 lb 9.9 oz (91 kg) IBW/kg (Calculated) : 50.1  Temp (24hrs), Avg:98.6 F (37 C), Min:98.1 F (36.7 C), Max:99.4 F (37.4 C)   Recent Labs Lab 11/07/15 1747  11/08/15 0203 11/08/15 0220 11/09/15 0430 11/09/15 1200 11/10/15 0415 11/11/15 0435  WBC 7.9  --   --  7.8 3.9*  --  4.2 4.9  CREATININE 1.43*  < >  --  1.48* 1.29* 1.30* 1.18* 1.09*  LATICACIDVEN  --   --  1.4  --   --   --   --   --   < > = values in this interval not displayed.  Estimated Creatinine Clearance: 41.8 mL/min (by C-G formula based on Cr of 1.09).    Allergies  Allergen Reactions  . Penicillins Anaphylaxis, Itching and Swelling    Has patient had a PCN reaction causing immediate rash, facial/tongue/throat swelling, SOB or lightheadedness with hypotension: Yes Has patient had a PCN reaction causing severe rash involving mucus membranes or skin necrosis: No  Has patient had a PCN reaction that required hospitalization: Unknown Has patient had a PCN reaction occurring within the last 10 years: No  If all of the above answers are "NO", then may proceed with Cephalosporin use.     Antimicrobials this admission: 2/14 aztreonam >  2/14 vanc >2/15 2/14 doxy >2/14  Microbiology results: 2/13 urine >> negative 2/14 blood x2 >> ngtd  Thank you for allowing pharmacy to be a part of this patient's care.  Fredrik Rigger 11/11/2015 10:31 AM

## 2015-11-11 NOTE — Progress Notes (Signed)
Patient's potassium 5.2, paged MD Molli Knock and informed MD of potassium value.  1600 potassium dose to be held per MD Molli Knock.

## 2015-11-11 NOTE — Progress Notes (Signed)
Pt back on rest mode. Pt back on FS at this time tolerating it well. No complications noted. RT will continue to monitor pt.

## 2015-11-12 ENCOUNTER — Inpatient Hospital Stay (HOSPITAL_COMMUNITY): Payer: Medicare Other

## 2015-11-12 DIAGNOSIS — L8990 Pressure ulcer of unspecified site, unspecified stage: Secondary | ICD-10-CM | POA: Insufficient documentation

## 2015-11-12 LAB — BASIC METABOLIC PANEL
ANION GAP: 14 (ref 5–15)
BUN: 59 mg/dL — ABNORMAL HIGH (ref 6–20)
CHLORIDE: 94 mmol/L — AB (ref 101–111)
CO2: 26 mmol/L (ref 22–32)
Calcium: 8 mg/dL — ABNORMAL LOW (ref 8.9–10.3)
Creatinine, Ser: 1.23 mg/dL — ABNORMAL HIGH (ref 0.44–1.00)
GFR calc non Af Amer: 40 mL/min — ABNORMAL LOW (ref >60)
GFR, EST AFRICAN AMERICAN: 46 mL/min — AB (ref >60)
Glucose, Bld: 177 mg/dL — ABNORMAL HIGH (ref 65–99)
Potassium: 3.6 mmol/L (ref 3.5–5.1)
Sodium: 134 mmol/L — ABNORMAL LOW (ref 135–145)

## 2015-11-12 LAB — CBC
HEMATOCRIT: 22.7 % — AB (ref 36.0–46.0)
HEMOGLOBIN: 7 g/dL — AB (ref 12.0–15.0)
MCH: 27.1 pg (ref 26.0–34.0)
MCHC: 30.8 g/dL (ref 30.0–36.0)
MCV: 88 fL (ref 78.0–100.0)
Platelets: 88 10*3/uL — ABNORMAL LOW (ref 150–400)
RBC: 2.58 MIL/uL — ABNORMAL LOW (ref 3.87–5.11)
RDW: 19.6 % — ABNORMAL HIGH (ref 11.5–15.5)
WBC: 4.4 10*3/uL (ref 4.0–10.5)

## 2015-11-12 LAB — GLUCOSE, CAPILLARY
GLUCOSE-CAPILLARY: 149 mg/dL — AB (ref 65–99)
GLUCOSE-CAPILLARY: 160 mg/dL — AB (ref 65–99)
GLUCOSE-CAPILLARY: 164 mg/dL — AB (ref 65–99)
Glucose-Capillary: 138 mg/dL — ABNORMAL HIGH (ref 65–99)
Glucose-Capillary: 154 mg/dL — ABNORMAL HIGH (ref 65–99)
Glucose-Capillary: 171 mg/dL — ABNORMAL HIGH (ref 65–99)
Glucose-Capillary: 178 mg/dL — ABNORMAL HIGH (ref 65–99)

## 2015-11-12 LAB — BLOOD GAS, ARTERIAL
ACID-BASE DEFICIT: 3.8 mmol/L — AB (ref 0.0–2.0)
Bicarbonate: 21.5 mEq/L (ref 20.0–24.0)
Drawn by: 44166
FIO2: 0.4
O2 SAT: 96.5 %
PCO2 ART: 35.8 mmHg (ref 35.0–45.0)
PH ART: 7.368 (ref 7.350–7.450)
Patient temperature: 90.7
RATE: 14 resp/min
TCO2: 22.8 mmol/L (ref 0–100)
VT: 500 mL
pO2, Arterial: 74.7 mmHg — ABNORMAL LOW (ref 80.0–100.0)

## 2015-11-12 LAB — PHOSPHORUS: PHOSPHORUS: 4.2 mg/dL (ref 2.5–4.6)

## 2015-11-12 LAB — MAGNESIUM: Magnesium: 2.3 mg/dL (ref 1.7–2.4)

## 2015-11-12 MED ORDER — POTASSIUM CHLORIDE 20 MEQ/15ML (10%) PO SOLN
40.0000 meq | Freq: Once | ORAL | Status: AC
  Administered 2015-11-12: 40 meq
  Filled 2015-11-12: qty 30

## 2015-11-12 MED ORDER — PANTOPRAZOLE SODIUM 40 MG PO PACK
40.0000 mg | PACK | Freq: Every day | ORAL | Status: DC
  Administered 2015-11-13 – 2015-11-15 (×3): 40 mg
  Filled 2015-11-12 (×3): qty 20

## 2015-11-12 MED ORDER — FUROSEMIDE 10 MG/ML IJ SOLN
40.0000 mg | Freq: Two times a day (BID) | INTRAMUSCULAR | Status: DC
  Administered 2015-11-12 – 2015-11-13 (×4): 40 mg via INTRAVENOUS
  Filled 2015-11-12 (×4): qty 4

## 2015-11-12 MED ORDER — METOPROLOL TARTRATE 25 MG/10 ML ORAL SUSPENSION
25.0000 mg | Freq: Two times a day (BID) | ORAL | Status: DC
  Administered 2015-11-12 – 2015-11-13 (×4): 25 mg
  Filled 2015-11-12 (×4): qty 10

## 2015-11-12 MED ORDER — METOPROLOL TARTRATE 1 MG/ML IV SOLN
2.5000 mg | INTRAVENOUS | Status: DC | PRN
  Administered 2015-11-16: 2.5 mg via INTRAVENOUS
  Filled 2015-11-12: qty 5

## 2015-11-12 NOTE — Progress Notes (Signed)
PULMONARY / CRITICAL CARE MEDICINE   Name: [name] MRN: [id number] DOB: Sep 05, 1931    ADMISSION DATE:  11/07/2015 CONSULTATION DATE:  11/08/15  REFERRING MD: Lars Masson, A  CHIEF COMPLAINT:  Altered mental status, hypercarbic resp failure.    SUBJECTIVE:  No acute events overnight.  Pt weaned on 8/5 yesterday but mental status barrier to extubation per RT.  Net negative 2700 since admission. VSS, afebrile.    VITAL SIGNS: BP 103/42 mmHg  Pulse 55  Temp(Src) 98.7 F (37.1 C) (Oral)  Resp 19  Ht  (1.575 m)  Wt 190 lb 0.6 oz (86.2 kg)  BMI 34.75 kg/m2  SpO2 100%  HEMODYNAMICS:    VENTILATOR SETTINGS: Vent Mode:  [-] PRVC FiO2 (%):  [40 %] 40 % Set Rate:  [10 bmp] 10 bmp Vt Set:  [400 mL] 400 mL PEEP:  [5 cmH20] 5 cmH20 Pressure Support:  [8 cmH20] 8 cmH20 Plateau Pressure:  [14 cmH20-15 cmH20] 14 cmH20  INTAKE / OUTPUT: I/O last 3 completed shifts: In: 3756.9 [I.V.:951.9; NG/GT:2255; IV Piggyback:550] Out: 4085 [Urine:4085]  PHYSICAL EXAMINATION: General: Chronically ill appearing elderly female, on vent, sedate. Neuro:  Arousable, parkinsonian, moving all ext to commands HEENT: [place]/AT, PERRL, EOM-I and MMM. Cardiovascular:  RRR, Nl S1/S2, -M/R/G. Lungs:  Even/non-labored on vent, lungs bilaterally diminished lower with soft crackles Abdomen: Soft, NT, ND and +BS. Ext: 1+ edema and -tenderness. Skin: Intact.  LABS:  BMET  Recent Labs Lab 11/11/15 0435 11/11/15 1400 11/12/15 0340  NA 134* 134* 134*  K 2.5* 5.2* 3.6  CL 94* 95* 94*  CO2 BUN 45* 48* 59*  CREATININE 1.09* 1.13* 1.23*  GLUCOSE 157* 145* 177*   Electrolytes  Recent Labs Lab 11/10/15 0415 11/11/15 0435 11/11/15 1400 11/12/15 0340  CALCIUM 8.2* 7.7* 8.0* 8.0*  MG 1.6* 1.8  --  2.3  PHOS 4.5 3.9  --  4.2   CBC  Recent Labs Lab 11/10/15 0415 11/11/15 0435 11/12/15 0340  WBC 4.2 4.9 4.4  HGB 7.9* 7.1* 7.0*  HCT 26.0* 22.5* 22.7*  PLT 77* 82* 88*    Coag's  Recent Labs Lab 11/07/15 1747  APTT 39*  INR 1.05   Sepsis Markers  Recent Labs Lab 11/08/15 0203  11/08/15 1257 11/09/15 0430 11/10/15 0415  LATICACIDVEN 1.4  --   --   --   --   PROCALCITON  --   < > 0.37 0.45 0.51  < > = values in this interval not displayed. ABG  Recent Labs Lab 11/10/15 0403 11/11/15 0333 11/12/15 0344  PHART 7.526* 7.482* 7.368  PCO2ART 31.1* 35.6 35.8  PO2ART 144.0* 140* 74.7*   Liver Enzymes  Recent Labs Lab 11/07/15 1747 11/08/15 0220  AST 64* 64*  ALT 28 29  ALKPHOS 123 131*  BILITOT 0.6 0.9  ALBUMIN 2.9* 3.1*   Cardiac Enzymes  Recent Labs Lab 11/08/15 0220 11/08/15 1257 11/09/15 0430  TROPONINI 0.66* 0.54* 0.52*   Glucose  Recent Labs Lab 11/11/15 0736 11/11/15 1216 11/11/15 1719 11/11/15 1945 11/11/15 2358 11/12/15 0340  GLUCAP 153* 117* 162* 163* 154* 171*   Imaging No results found.  STUDIES:  CT head 2/13. Chronic age-related changes, old infarcts. No acute abnormality. MRI head 2/13. No acute intracranial process Chest x-ray 2/14 bilateral pleural effusions, basilar opacities  CULTURES:   ANTIBIOTICS: Vanc 02/14 >2/16 Azactam 02/14 >2/21   SIGNIFICANT EVENTS: 2/14  Admit with altered mental status, hypercarbic, failed bipap / intubated 2/17  Remains on lasix gtt, precedex   LINES/TUBES: ETT 02/14>>> L IJ TLC 2/14>>>  ASSESSMENT / PLAN:  PULMONARY A: Hypercarbic respiratory failure - did not respond to BiPAP; therefore, moved to ICU in PM of 02/14 for intubation. P:   Daily SBT / WUA  Wean as able. Pulmonary hygiene. Diuresis as below. Follow CXR. Albuterol BID  Duoneb Q4 PRN   CARDIOVASCULAR A:  Coronary artery disease. S/p multiple stents and CABG. Hx HTN, CHF P:  Lasix drip at 10 mg/hr, consider d/c in am 2/19. If RVR recurs will need amiodarone drip. ICU monitoring of hemodynamics  Strict I/O's ASA QD  RENAL A:   AKI. Hyponatremia - volume  overloaded. Hypokalemia  P:   Diuresis as above. Correct electrolytes as indicated KCL 2/18  Follow BMP / UOP  Monitor sr cr closely with lasix gtt  GASTROINTESTINAL A:   GI prophylaxis. Nutrition. Ammonia elevated, likely hepatic congestion. Constipation  P:   SUP: pantoprazole. TF per nutrition. Ammonia level did not rise off lactulose, will not restart Senakot 2 tablets BID. Miralax once a day.   HEMATOLOGIC A:   Anemia. Thrombocytopenia. VTE prophylaxis. P:  Transfuse for Hgb < 7. Monitor platelet counts. SCD's. Monitor CBC   INFECTIOUS A:   Recent UTI and LLL PNA. P:   Cultures as above  Continue aztreonam, D5/8  ENDOCRINE A:   Diabetes mellitus. P:   SSI.  NEUROLOGIC A:   Acute hypercarbic + hepatic encephalopathy - failed BiPAP and required intubation PM of 02/14. H/O old strokes with residual Rt weakness. P:   Sedation: Precedex gtt / Fentanyl PRN. RASS goal: 0 to -1. Daily WUA. Continue Primidone for parkinson.  FAMILY  - Updates: No family available am 2/18.    - Inter-disciplinary family meet or Palliative Care meeting due by:  2/21.   Canary Brim, NP-C [place] [facility] Pgr: [phone] or if no answer [phone] 11/12/2015, 7:53 AM

## 2015-11-12 NOTE — Progress Notes (Signed)
abg collected  

## 2015-11-13 ENCOUNTER — Inpatient Hospital Stay (HOSPITAL_COMMUNITY): Payer: Medicare Other

## 2015-11-13 LAB — GLUCOSE, CAPILLARY
GLUCOSE-CAPILLARY: 162 mg/dL — AB (ref 65–99)
GLUCOSE-CAPILLARY: 179 mg/dL — AB (ref 65–99)
Glucose-Capillary: 154 mg/dL — ABNORMAL HIGH (ref 65–99)
Glucose-Capillary: 159 mg/dL — ABNORMAL HIGH (ref 65–99)
Glucose-Capillary: 171 mg/dL — ABNORMAL HIGH (ref 65–99)
Glucose-Capillary: 173 mg/dL — ABNORMAL HIGH (ref 65–99)

## 2015-11-13 LAB — CBC
HEMATOCRIT: 24.6 % — AB (ref 36.0–46.0)
HEMOGLOBIN: 7.4 g/dL — AB (ref 12.0–15.0)
MCH: 26.1 pg (ref 26.0–34.0)
MCHC: 30.1 g/dL (ref 30.0–36.0)
MCV: 86.9 fL (ref 78.0–100.0)
Platelets: 126 10*3/uL — ABNORMAL LOW (ref 150–400)
RBC: 2.83 MIL/uL — ABNORMAL LOW (ref 3.87–5.11)
RDW: 20 % — ABNORMAL HIGH (ref 11.5–15.5)
WBC: 5.7 10*3/uL (ref 4.0–10.5)

## 2015-11-13 LAB — BASIC METABOLIC PANEL
ANION GAP: 15 (ref 5–15)
BUN: 71 mg/dL — ABNORMAL HIGH (ref 6–20)
CHLORIDE: 91 mmol/L — AB (ref 101–111)
CO2: 29 mmol/L (ref 22–32)
Calcium: 8.2 mg/dL — ABNORMAL LOW (ref 8.9–10.3)
Creatinine, Ser: 1.16 mg/dL — ABNORMAL HIGH (ref 0.44–1.00)
GFR calc Af Amer: 49 mL/min — ABNORMAL LOW (ref >60)
GFR calc non Af Amer: 43 mL/min — ABNORMAL LOW (ref >60)
Glucose, Bld: 159 mg/dL — ABNORMAL HIGH (ref 65–99)
POTASSIUM: 3.3 mmol/L — AB (ref 3.5–5.1)
SODIUM: 135 mmol/L (ref 135–145)

## 2015-11-13 LAB — CULTURE, BLOOD (ROUTINE X 2)
CULTURE: NO GROWTH
CULTURE: NO GROWTH

## 2015-11-13 MED ORDER — FENTANYL CITRATE (PF) 100 MCG/2ML IJ SOLN
25.0000 ug | INTRAMUSCULAR | Status: DC | PRN
  Administered 2015-11-13 – 2015-11-14 (×6): 50 ug via INTRAVENOUS
  Filled 2015-11-13 (×6): qty 2

## 2015-11-13 MED ORDER — POTASSIUM CHLORIDE 20 MEQ/15ML (10%) PO SOLN
40.0000 meq | Freq: Once | ORAL | Status: AC
  Administered 2015-11-13: 40 meq
  Filled 2015-11-13: qty 30

## 2015-11-13 NOTE — Progress Notes (Signed)
PULMONARY / CRITICAL CARE MEDICINE   Name: [name] MRN: [id number] DOB: Apr 25, 1931    ADMISSION DATE:  11/07/2015 CONSULTATION DATE:  11/08/15  REFERRING MD: Lars Masson, A  CHIEF COMPLAINT:  Altered mental status, hypercarbic resp failure.    SUBJECTIVE:  No acute events overnight.  Pt more alert/awake this am.  Follows commands (when demonstrated first)    VITAL SIGNS: BP 108/54 mmHg  Pulse 71  Temp(Src) 99.8 F (37.7 C) (Oral)  Resp 14  Ht  (1.575 m)  Wt 189 lb 13.1 oz (86.1 kg)  BMI 34.71 kg/m2  SpO2 100%  HEMODYNAMICS:    VENTILATOR SETTINGS: Vent Mode:  [-] PRVC FiO2 (%):  [40 %] 40 % Set Rate:  [10 bmp] 10 bmp Vt Set:  [400 mL] 400 mL PEEP:  [5 cmH20] 5 cmH20 Pressure Support:  [8 cmH20] 8 cmH20 Plateau Pressure:  [20 cmH20-27 cmH20] 20 cmH20  INTAKE / OUTPUT: I/O last 3 completed shifts: In: 3116.3 [I.V.:661.3; NG/GT:2205; IV Piggyback:250] Out: 4682 [Urine:4682]  PHYSICAL EXAMINATION: General: Chronically ill appearing elderly female, on vent Neuro:  Arousable, parkinsonian, moving all ext to commands HEENT: [place]/AT, PERRL, EOM-I and MMM. Cardiovascular:  RRR, Nl S1/S2, -M/R/G. Lungs:  Even/non-labored on vent, lungs bilaterally diminished  Abdomen: Soft, NT, ND and +BS. Ext: 1+ edema and -tenderness. Skin: Intact.   LABS:  BMET  Recent Labs Lab 11/11/15 1400 11/12/15 0340 11/13/15 0418  NA 134* 134* 135  K 5.2* 3.6 3.3*  CL 95* 94* 91*  CO2 BUN 48* 59* 71*  CREATININE 1.13* 1.23* 1.16*  GLUCOSE 145* 177* 159*   Electrolytes  Recent Labs Lab 11/10/15 0415 11/11/15 0435 11/11/15 1400 11/12/15 0340 11/13/15 0418  CALCIUM 8.2* 7.7* 8.0* 8.0* 8.2*  MG 1.6* 1.8  --  2.3  --   PHOS 4.5 3.9  --  4.2  --    CBC  Recent Labs Lab 11/11/15 0435 11/12/15 0340 11/13/15 0418  WBC 4.9 4.4 5.7  HGB 7.1* 7.0* 7.4*  HCT 22.5* 22.7* 24.6*  PLT 82* 88* 126*   Coag's  Recent Labs Lab 11/07/15 1747  APTT 39*   INR 1.05   Sepsis Markers  Recent Labs Lab 11/08/15 0203  11/08/15 1257 11/09/15 0430 11/10/15 0415  LATICACIDVEN 1.4  --   --   --   --   PROCALCITON  --   < > 0.37 0.45 0.51  < > = values in this interval not displayed. ABG  Recent Labs Lab 11/10/15 0403 11/11/15 0333 11/12/15 0344  PHART 7.526* 7.482* 7.368  PCO2ART 31.1* 35.6 35.8  PO2ART 144.0* 140* 74.7*   Liver Enzymes  Recent Labs Lab 11/07/15 1747 11/08/15 0220  AST 64* 64*  ALT 28 29  ALKPHOS 123 131*  BILITOT 0.6 0.9  ALBUMIN 2.9* 3.1*   Cardiac Enzymes  Recent Labs Lab 11/08/15 0220 11/08/15 1257 11/09/15 0430  TROPONINI 0.66* 0.54* 0.52*   Glucose  Recent Labs Lab 11/12/15 0747 11/12/15 1120 11/12/15 1551 11/12/15 2044 11/12/15 2346 11/13/15 0349  GLUCAP 149* 138* 160* 178* 164* 159*   Imaging Dg Chest Port 1 View  11/13/2015  CLINICAL DATA:  Acute respiratory failure.  Intubated patient. EXAM: PORTABLE CHEST 1 VIEW COMPARISON:  11/12/2015 FINDINGS: Endotracheal tube tip now projects at the chronic. Recommend retracting 1-2 cm. Left internal jugular central venous line and the nasal/ orogastric tube are stable and well positioned. Bilateral lower lung zone airspace opacities are similar to the prior  exam allowing for differences in patient positioning. Findings consistent with a combination of atelectasis, residual edema and pleural effusions. Cardiac silhouette is enlarged but stable changes from previous CABG surgery. No pneumothorax. IMPRESSION: 1. Endotracheal tube tip now projects pathic carina. Consider retracting 1-2 cm for more optimal positioning. 2. No significant change in lung aeration since the previous day's study. Electronically Signed   By: Amie Portland M.D.   On: 11/13/2015 07:28    STUDIES:  CT head 2/13. Chronic age-related changes, old infarcts. No acute abnormality. MRI head 2/13. No acute intracranial process Chest x-ray 2/14 bilateral pleural effusions, basilar  opacities  CULTURES:   ANTIBIOTICS: Vanc 02/14 >> 2/16 Azactam 02/14 >> 2/21   SIGNIFICANT EVENTS: 2/14  Admit with altered mental status, hypercarbic, failed bipap / intubated 2/17  Remains on lasix gtt, precedex   LINES/TUBES: ETT 02/14>>> L IJ TLC 2/14>>>  ASSESSMENT / PLAN:  PULMONARY A: Hypercarbic respiratory failure - in setting of CHF, effusions and LLL PNA, did not respond to BiPAP; therefore, moved to ICU in PM of 02/14 for intubation. Pleural Effusions P:   Daily SBT / WUA, hopeful for extubation 2/19 Wean as able. Pulmonary hygiene. Follow CXR. Albuterol BID  Duoneb Q4 PRN  Lasix as below   CARDIOVASCULAR A:  Coronary artery disease. S/p multiple stents and CABG. Hx HTN, CHF P:  If RVR recurs will need amiodarone drip. ICU monitoring of hemodynamics  Strict I/O's ASA QD Lasix 40 mg IV Q12  RENAL A:   AKI. Hyponatremia - volume overloaded. Hypokalemia  P:   Diuresis as above. Correct electrolytes as indicated Follow BMP / UOP   GASTROINTESTINAL A:   GI prophylaxis. Nutrition. Ammonia elevated, likely hepatic congestion. Constipation  P:   SUP: pantoprazole. TF per nutrition. Ammonia level did not rise off lactulose, no restart Senakot 2 tablets BID. Miralax once a day.   HEMATOLOGIC A:   Anemia. Thrombocytopenia. VTE prophylaxis. P:  Transfuse for Hgb < 7. Monitor platelet counts. SCD's. Monitor CBC   INFECTIOUS A:   Recent UTI and LLL PNA. P:   Cultures as above  Continue aztreonam, D6/8  ENDOCRINE A:   Diabetes mellitus. P:   SSI.  NEUROLOGIC A:   Acute hypercarbic + hepatic encephalopathy - failed BiPAP and required intubation PM of 02/14. H/O old strokes with residual Rt weakness. P:   Sedation: Fentanyl PRN. D/C precedex RASS goal: 0 to -1. Daily WUA. Continue Primidone for parkinson.  FAMILY  - Updates: No family available am 2/19.    - Inter-disciplinary family meet or Palliative Care meeting  due by:  2/21.   Canary Brim, NP-C [facility] Pgr: [phone] or if no answer [phone] 11/13/2015, 8:32 AM

## 2015-11-13 NOTE — Progress Notes (Signed)
CRITICAL VALUE ALERT  Critical value received: Potassium 3.3   Date of notification:  11/13/2015   Time of notification:  0524  Critical value read back:No.  Nurse who received alert: Jacqualin Combes, RN, BSN, CCRN   MD notified (1st page):    Time of first page:  [phone]  MD notified (2nd page):  Time of second page:  Responding MD:  Dr. Gaylan Gerold, MD  Time MD responded:  [phone]  Dr. Otho Bellows, MD notified of pt. Having frequent PVC's and non-sustained runs of VT. K+: 3.3. No new orders at this time. Will continue to monitor pt. And update healthcare team as necessary.

## 2015-11-13 NOTE — Progress Notes (Signed)
eLink Physician-Brief Progress Note Patient Name: [name] DOB: 06-17-1931 MRN: [id number]   Date of Service  11/13/2015  HPI/Events of Note  Hypokalemia  eICU Interventions  Potassium replaced     Intervention Category Intermediate Interventions: Electrolyte abnormality - evaluation and management  Kourtlynn Trevor 11/13/2015, 5:24 AM

## 2015-11-13 NOTE — Progress Notes (Signed)
eLink Physician-Brief Progress Note Patient Name: [name] DOB: 1930-12-29 MRN: [id number]   Date of Service  11/13/2015  HPI/Events of Note  Agitation.  eICU Interventions  Will increase Fentanyl IV to Q 1 hour PRN.      Intervention Category Minor Interventions: Agitation / anxiety - evaluation and management  Sommer,Steven Eugene 11/13/2015, 8:45 PM

## 2015-11-14 ENCOUNTER — Encounter (HOSPITAL_COMMUNITY): Payer: Self-pay | Admitting: *Deleted

## 2015-11-14 ENCOUNTER — Ambulatory Visit: Payer: Medicare Other | Admitting: Family Medicine

## 2015-11-14 ENCOUNTER — Inpatient Hospital Stay (HOSPITAL_COMMUNITY): Payer: Medicare Other

## 2015-11-14 DIAGNOSIS — I5023 Acute on chronic systolic (congestive) heart failure: Secondary | ICD-10-CM

## 2015-11-14 LAB — BASIC METABOLIC PANEL
ANION GAP: 13 (ref 5–15)
Anion gap: 14 (ref 5–15)
BUN: 82 mg/dL — AB (ref 6–20)
BUN: 87 mg/dL — AB (ref 6–20)
CALCIUM: 8.1 mg/dL — AB (ref 8.9–10.3)
CHLORIDE: 91 mmol/L — AB (ref 101–111)
CO2: 29 mmol/L (ref 22–32)
CO2: 29 mmol/L (ref 22–32)
CREATININE: 1.09 mg/dL — AB (ref 0.44–1.00)
Calcium: 8.3 mg/dL — ABNORMAL LOW (ref 8.9–10.3)
Chloride: 90 mmol/L — ABNORMAL LOW (ref 101–111)
Creatinine, Ser: 1.07 mg/dL — ABNORMAL HIGH (ref 0.44–1.00)
GFR calc Af Amer: 53 mL/min — ABNORMAL LOW (ref >60)
GFR calc Af Amer: 54 mL/min — ABNORMAL LOW (ref >60)
GFR calc non Af Amer: 46 mL/min — ABNORMAL LOW (ref >60)
GFR, EST NON AFRICAN AMERICAN: 47 mL/min — AB (ref >60)
GLUCOSE: 161 mg/dL — AB (ref 65–99)
Glucose, Bld: 236 mg/dL — ABNORMAL HIGH (ref 65–99)
POTASSIUM: 3.1 mmol/L — AB (ref 3.5–5.1)
POTASSIUM: 3.3 mmol/L — AB (ref 3.5–5.1)
SODIUM: 132 mmol/L — AB (ref 135–145)
Sodium: 134 mmol/L — ABNORMAL LOW (ref 135–145)

## 2015-11-14 LAB — CBC
HEMATOCRIT: 23.2 % — AB (ref 36.0–46.0)
HEMOGLOBIN: 7.2 g/dL — AB (ref 12.0–15.0)
MCH: 27 pg (ref 26.0–34.0)
MCHC: 31 g/dL (ref 30.0–36.0)
MCV: 86.9 fL (ref 78.0–100.0)
Platelets: 116 10*3/uL — ABNORMAL LOW (ref 150–400)
RBC: 2.67 MIL/uL — ABNORMAL LOW (ref 3.87–5.11)
RDW: 20.1 % — AB (ref 11.5–15.5)
WBC: 4.9 10*3/uL (ref 4.0–10.5)

## 2015-11-14 LAB — GLUCOSE, CAPILLARY
GLUCOSE-CAPILLARY: 101 mg/dL — AB (ref 65–99)
GLUCOSE-CAPILLARY: 167 mg/dL — AB (ref 65–99)
Glucose-Capillary: 153 mg/dL — ABNORMAL HIGH (ref 65–99)
Glucose-Capillary: 166 mg/dL — ABNORMAL HIGH (ref 65–99)
Glucose-Capillary: 186 mg/dL — ABNORMAL HIGH (ref 65–99)

## 2015-11-14 LAB — CARBOXYHEMOGLOBIN
CARBOXYHEMOGLOBIN: 1.9 % — AB (ref 0.5–1.5)
Methemoglobin: 0.7 % (ref 0.0–1.5)
O2 SAT: 77.7 %
TOTAL HEMOGLOBIN: 9.3 g/dL — AB (ref 12.0–16.0)

## 2015-11-14 LAB — OCCULT BLOOD X 1 CARD TO LAB, STOOL: FECAL OCCULT BLD: NEGATIVE

## 2015-11-14 LAB — MAGNESIUM: MAGNESIUM: 2 mg/dL (ref 1.7–2.4)

## 2015-11-14 MED ORDER — ASPIRIN 81 MG PO CHEW
81.0000 mg | CHEWABLE_TABLET | Freq: Every day | ORAL | Status: DC
  Administered 2015-11-14 – 2015-11-16 (×3): 81 mg
  Filled 2015-11-14 (×3): qty 1

## 2015-11-14 MED ORDER — DEXMEDETOMIDINE HCL IN NACL 200 MCG/50ML IV SOLN
0.2000 ug/kg/h | INTRAVENOUS | Status: DC
  Administered 2015-11-14: 0.4 ug/kg/h via INTRAVENOUS
  Administered 2015-11-15 – 2015-11-16 (×2): 0.1 ug/kg/h via INTRAVENOUS
  Filled 2015-11-14 (×3): qty 50

## 2015-11-14 MED ORDER — ATORVASTATIN CALCIUM 40 MG PO TABS
40.0000 mg | ORAL_TABLET | Freq: Every day | ORAL | Status: DC
  Administered 2015-11-14 – 2015-11-23 (×9): 40 mg via ORAL
  Filled 2015-11-14 (×9): qty 1

## 2015-11-14 MED ORDER — SPIRONOLACTONE 25 MG PO TABS
25.0000 mg | ORAL_TABLET | Freq: Every day | ORAL | Status: DC
  Administered 2015-11-14 – 2015-11-18 (×5): 25 mg
  Filled 2015-11-14 (×5): qty 1

## 2015-11-14 MED ORDER — POTASSIUM CHLORIDE 20 MEQ/15ML (10%) PO SOLN
40.0000 meq | Freq: Once | ORAL | Status: AC
  Administered 2015-11-14: 40 meq
  Filled 2015-11-14: qty 30

## 2015-11-14 MED ORDER — FUROSEMIDE 10 MG/ML IJ SOLN: 80.0000 mg | Freq: Three times a day (TID) | INTRAMUSCULAR | Status: DC

## 2015-11-14 MED ORDER — FUROSEMIDE 10 MG/ML IJ SOLN
80.0000 mg | Freq: Three times a day (TID) | INTRAMUSCULAR | Status: DC
  Administered 2015-11-14: 80 mg via INTRAVENOUS
  Filled 2015-11-14: qty 8

## 2015-11-14 MED ORDER — CARVEDILOL 3.125 MG PO TABS
3.1250 mg | ORAL_TABLET | Freq: Two times a day (BID) | ORAL | Status: DC
Start: 2015-11-14 — End: 2015-11-25
  Administered 2015-11-14 – 2015-11-25 (×20): 3.125 mg via ORAL
  Filled 2015-11-14 (×21): qty 1

## 2015-11-14 MED ORDER — ANTISEPTIC ORAL RINSE SOLUTION (CORINZ)
7.0000 mL | OROMUCOSAL | Status: DC
  Administered 2015-11-14 – 2015-11-16 (×21): 7 mL via OROMUCOSAL

## 2015-11-14 MED ORDER — SODIUM CHLORIDE 0.9 % IV SOLN
INTRAVENOUS | Status: DC
  Administered 2015-11-14 – 2015-11-19 (×2): via INTRAVENOUS

## 2015-11-14 NOTE — Progress Notes (Signed)
Spoke with daughter who lives with patient. Daughter states that the patient often coughs while eating/drinking at home. Swallow eval if extubated?

## 2015-11-14 NOTE — Progress Notes (Signed)
eLink Physician-Brief Progress Note Patient Name: [name] DOB: [birth year] MRN: [id number]   Date of Service  11/14/2015  HPI/Events of Note  K low  eICU Interventions  Give K.     Intervention Category Major Interventions: Other:  Darleny Sem 11/14/2015, 5:55 AM

## 2015-11-14 NOTE — Progress Notes (Signed)
PULMONARY / CRITICAL CARE MEDICINE   Name: [name] MRN: [id number] DOB: November 19, 1930    ADMISSION DATE:  11/07/2015 CONSULTATION DATE:  11/08/15  REFERRING MD: Lars Masson, A  CHIEF COMPLAINT:  Altered mental status, hypercarbic resp failure.     STUDIES:  CT head 2/13. Chronic age-related changes, old infarcts. No acute abnormality. MRI head 2/13. No acute intracranial process Chest x-ray 2/14 bilateral pleural effusions, basilar opacities  CULTURES:   ANTIBIOTICS: Vanc 02/14 >> 2/16 Azactam 02/14 >> 2/21   SIGNIFICANT EVENTS: 2/14  Admit with altered mental status, hypercarbic, failed bipap / intubated 2/17  Remains on lasix gtt, precedex   LINES/TUBES: ETT 02/14>>> L IJ TLC 2/14>>>   EVENTS 2/19 :  No acute events overnight.  Pt more alert/awake this am.  Follows commands (when demonstrated first)     SUBJECTIVE/OVERNIGHT/INTERVAL HX 11/14/15- pn prn fentanyl. Just started sbt . Daughter at bedside. RN reports patient exhausted and edematous and sometimes agitated   VITAL SIGNS: BP 115/36 mmHg  Pulse 68  Temp(Src) 98.5 F (36.9 C) (Axillary)  Resp 14  Ht 5\' 2"  (1.575 m)  Wt 84.3 kg (185 lb 13.6 oz)  BMI 33.98 kg/m2  SpO2 100%  HEMODYNAMICS:    VENTILATOR SETTINGS: Vent Mode:  [-] PSV FiO2 (%):  [40 %] 40 % Set Rate:  [10 bmp] 10 bmp Vt Set:  [400 mL] 400 mL PEEP:  [5 cmH20] 5 cmH20 Pressure Support:  [8 cmH20] 8 cmH20 Plateau Pressure:  [20 cmH20-21 cmH20] 20 cmH20  INTAKE / OUTPUT: I/O last 3 completed shifts: In: 2850 [I.V.:350; Other:200; NG/GT:2050; IV Piggyback:250] Out: 4495 [Urine:4495]  PHYSICAL EXAMINATION: General: Chronically ill appearing elderly female, on vent. LOOKS DECONDITIONED. LOOKS EXHAUSTED Neuro:  Arousable, parkinsonian, moving all ext to commands RASS 0.  HEENT: [place]/AT, PERRL, EOM-I and MMM. Cardiovascular:  RRR, Nl S1/S2, -M/R/G. Lungs:  Even/non-labored on vent, lungs bilaterally diminished  Abdomen: Soft,  NT, ND and +BS. Ext: 1+ edema and -tenderness. Skin: Intact.   LABS:  PULMONARY  Recent Labs Lab 11/08/15 1655 11/09/15 0355 11/10/15 0403 11/11/15 0333 11/12/15 0344  PHART 7.389 7.595* 7.526* 7.482* 7.368  PCO2ART 53.7* 28.4* 31.1* 35.6 35.8  PO2ART 84.0 121.0* 144.0* 140* 74.7*  HCO3 32.6* 27.7* 25.9* 26.2* 21.5  TCO2 34 29 27 27.3 22.8  O2SAT 96.0 99.0 100.0 99.2 96.5    CBC  Recent Labs Lab 11/12/15 0340 11/13/15 0418 11/14/15 0400  HGB 7.0* 7.4* 7.2*  HCT 22.7* 24.6* 23.2*  WBC 4.4 5.7 4.9  PLT 88* 126* 116*    COAGULATION  Recent Labs Lab 11/07/15 1747  INR 1.05    CARDIAC   Recent Labs Lab 11/08/15 0220 11/08/15 1257 11/09/15 0430  TROPONINI 0.66* 0.54* 0.52*   No results for input(s): PROBNP in the last 168 hours.   CHEMISTRY  Recent Labs Lab 11/09/15 0430  11/10/15 0415 11/11/15 0435 11/11/15 1400 11/12/15 0340 11/13/15 0418 11/14/15 0400  NA 135  < > 140 134* 134* 134* 135 134*  K 3.1*  < > 4.3 2.5* 5.2* 3.6 3.3* 3.1*  CL 93*  < > 98* 94* 95* 94* 91* 91*  CO2 28  < > 28 27 27 26 29 29   GLUCOSE 112*  < > 175* 157* 145* 177* 159* 161*  BUN 44*  < > 42* 45* 48* 59* 71* 82*  CREATININE 1.29*  < > 1.18* 1.09* 1.13* 1.23* 1.16* 1.09*  CALCIUM 8.3*  < > 8.2* 7.7* 8.0* 8.0* 8.2* 8.3*  MG 1.1*  --  1.6* 1.8  --  2.3  --   --   PHOS 1.8*  --  4.5 3.9  --  4.2  --   --   < > = values in this interval not displayed. Estimated Creatinine Clearance: 40.1 mL/min (by C-G formula based on Cr of 1.09).   LIVER  Recent Labs Lab 11/07/15 1747 11/08/15 0220  AST 64* 64*  ALT 28 29  ALKPHOS 123 131*  BILITOT 0.6 0.9  PROT 6.0* 6.2*  ALBUMIN 2.9* 3.1*  INR 1.05  --      INFECTIOUS  Recent Labs Lab 11/08/15 0203  11/08/15 1257 11/09/15 0430 11/10/15 0415  LATICACIDVEN 1.4  --   --   --   --   PROCALCITON  --   < > 0.37 0.45 0.51  < > = values in this interval not displayed.   ENDOCRINE CBG (last 3)   Recent Labs   11/13/15 2020 11/13/15 2337 11/14/15 0403  GLUCAP 173* 154* 153*         IMAGING x48h  - image(s) personally visualized  -   highlighted in bold Dg Chest Port 1 View  11/14/2015  CLINICAL DATA:  Respiratory failure. EXAM: PORTABLE CHEST 1 VIEW COMPARISON:  11/13/2015. FINDINGS: Endotracheal tube 7 mm above the carina. Proximal repositioning again suggested. Left IJ line with the tube in stable position. Prior CABG. Cardiomegaly with bilateral pulmonary infiltrates consistent pulmonary edema again noted. Bilateral pneumonia cannot be excluded. Low lung volumes. Small pleural effusions noted. IMPRESSION: 1. Endotracheal tube tip noted 7 mm above the lower portion of the carina. Again proximal repositioning suggested. The left IJ line NG tube in stable position. 2. Prior CABG. Cardiomegaly with bilateral pulmonary infiltrates and pleural effusions consistent congestive heart failure again noted. Bilateral pneumonia cannot be excluded. Low lung volumes. These results will be called to the ordering clinician or representative by the Radiologist Assistant, and communication documented in the PACS or zVision Dashboard. Electronically Signed   By: Maisie Fus  Register   On: 11/14/2015 07:02   Dg Chest Port 1 View  11/13/2015  CLINICAL DATA:  Acute respiratory failure.  Intubated patient. EXAM: PORTABLE CHEST 1 VIEW COMPARISON:  11/12/2015 FINDINGS: Endotracheal tube tip now projects at the chronic. Recommend retracting 1-2 cm. Left internal jugular central venous line and the nasal/ orogastric tube are stable and well positioned. Bilateral lower lung zone airspace opacities are similar to the prior exam allowing for differences in patient positioning. Findings consistent with a combination of atelectasis, residual edema and pleural effusions. Cardiac silhouette is enlarged but stable changes from previous CABG surgery. No pneumothorax. IMPRESSION: 1. Endotracheal tube tip now projects pathic carina. Consider  retracting 1-2 cm for more optimal positioning. 2. No significant change in lung aeration since the previous day's study. Electronically Signed   By: Amie Portland M.D.   On: 11/13/2015 07:28      ASSESSMENT / PLAN:  PULMONARY A: Hypercarbic respiratory failure - in setting of CHF, effusions and LLL PNA, did not respond to BiPAP; therefore, moved to ICU in PM of 02/14 for intubation. Pleural Effusions   - strted SBT but with pulm edema and parkinson and deconditioning extubation likely no go 11/14/15  P:   Daily SBT / WUA, - no extubation 11/14/15 Wean as able. Pulmonary hygiene. Follow CXR. Albuterol BID  Duoneb Q4 PRN  Lasix as below   CARDIOVASCULAR A:  Coronary artery disease. S/p multiple stents and CABG. Hx HTN, CHF  -  siginficant 3rd spacing + , DIAST BP HIGH 11/14/15  P:  Cards consult called 11/14/2015  Step up diuressis 11/14/2015  If RVR recurs will need amiodarone drip. ICU monitoring of hemodynamics  Strict I/O's ASA QD Lasix 40 mg IV Q12  RENAL A:   AKI.  imprved Hypokalemia  - repleted by eMD P:   Diuresis as above. Correct electrolytes as indicated Follow BMP / UOP   GASTROINTESTINAL A:   GI prophylaxis. Nutrition. Ammonia elevated, likely hepatic congestion. Constipation  P:   SUP: pantoprazole. TF per nutrition. Ammonia level did not rise off lactulose, no restart Senakot 2 tablets BID. Miralax once a day.   HEMATOLOGIC A:   Anemia. Thrombocytopenia. VTE prophylaxis. P:  Transfuse for Hgb < 7. Monitor platelet counts. SCD's. Monitor CBC   INFECTIOUS A:   Recent UTI and LLL PNA. P:   Cultures as above  Continue aztreonam, D7/8  ENDOCRINE A:   Diabetes mellitus. P:   SSI.  NEUROLOGIC A:   Acute hypercarbic + hepatic encephalopathy - failed BiPAP and required intubation PM of 02/14. H/O old strokes with residual Rt weakness. P:   Sedation: Fentanyl PRN. D/C precedex RASS goal: 0 to -1. Daily WUA. Continue  Primidone for parkinson.  FAMILY  - Updates: No family available am 2/19.  Daughter at bedside updated. One daughter from [place] updated on  Phone   - Inter-disciplinary family meet or Palliative Care meeting due by:  2/21.    The patient is critically ill with multiple organ systems failure and requires high complexity decision making for assessment and support, frequent evaluation and titration of therapies, application of advanced monitoring technologies and extensive interpretation of multiple databases.   Critical Care Time devoted to patient care services described in this note is  30  Minutes. This time reflects time of care of this signee Dr Kalman Shan. This critical care time does not reflect procedure time, or teaching time or supervisory time of PA/NP/Med student/Med Resident etc but could involve care discussion time    Dr. Kalman Shan, M.D., [facility]C.P Pulmonary and Critical Care Medicine Staff Physician [facility] Pager: [phone], If no answer or between  15:00h - 7:00h: call [phone]  11/14/2015 8:50 AM

## 2015-11-14 NOTE — Progress Notes (Signed)
   CVP 5. CO-OX 78%    Stop IV lasix . Amy Clegg  NP-C   2:25 PM

## 2015-11-14 NOTE — Progress Notes (Signed)
Pharmacy Antibiotic Note  [name] is a 82 y.o. female admitted on 11/07/2015 with LLL pneumonia. She continues on day #7/8 aztreonam. Renal function improved. Dose remains appropriate. Noted plan for 8 days of therapy total.  Plan: 1) Continue aztreonam 1g IV q8 2) Stop date 2/21  Height:  (157.5 cm) Weight: 185 lb 13.6 oz (84.3 kg) IBW/kg (Calculated) : 50.1  Temp (24hrs), Avg:98.4 F (36.9 C), Min:97.9 F (36.6 C), Max:98.9 F (37.2 C)   Recent Labs Lab 11/08/15 0203  11/10/15 0415 11/11/15 0435 11/11/15 1400 11/12/15 0340 11/13/15 0418 11/14/15 0400  WBC  --   < > 4.2 4.9  --  4.4 5.7 4.9  CREATININE  --   < > 1.18* 1.09* 1.13* 1.23* 1.16* 1.09*  LATICACIDVEN 1.4  --   --   --   --   --   --   --   < > = values in this interval not displayed.  Estimated Creatinine Clearance: 40.1 mL/min (by C-G formula based on Cr of 1.09).    Allergies  Allergen Reactions  . Penicillins Anaphylaxis, Itching and Swelling    Has patient had a PCN reaction causing immediate rash, facial/tongue/throat swelling, SOB or lightheadedness with hypotension: Yes Has patient had a PCN reaction causing severe rash involving mucus membranes or skin necrosis: No  Has patient had a PCN reaction that required hospitalization: Unknown Has patient had a PCN reaction occurring within the last 10 years: No  If all of the above answers are "NO", then may proceed with Cephalosporin use.     Antimicrobials this admission: 2/14 aztreonam >  2/14 vanc >2/15 2/14 doxy >2/14  Microbiology results: 2/13 urine >> ngf 2/14 blood x2 >> ngf  Thank you for allowing pharmacy to be a part of this patient's care.  Jeronimo Hellberg C. Marvis Moeller, PharmD Pharmacy Resident  Pager: [phone] 11/14/2015 10:02 AM

## 2015-11-14 NOTE — Progress Notes (Signed)
Nutrition Follow-up  DOCUMENTATION CODES:   Obesity unspecified  INTERVENTION:   Continue Vital High Protein @ 50 ml/hr Provides: 1200 kcal, 105 grams protein, and 1003 ml H2O.   NUTRITION DIAGNOSIS:   Inadequate oral intake related to inability to eat as evidenced by NPO status. Ongoing.   GOAL:   Provide needs based on ASPEN/SCCM guidelines Met.   MONITOR:   Vent status, I & O's, TF tolerance  ASSESSMENT:   [name] female with hx of DM, stroke, HTN, CHF (EF 50%) was admitted with encephalopathy. Pt recently hospitalized at [facility] for abd pain and dx with UTI. Pt with respiratory failure in setting of CHF, effusions and LLL PNA who failed BiPAP, intubated 2/14.  Per MD note pt too deconditioned and has pulmonary edema and parkinson's which is preventing her extubation. 3rd spacing, increasing diuresis. Off of lactulose.   Patient is currently intubated on ventilator support MV: 6.1 L/min Temp (24hrs), Avg:98.2 F (36.8 C), Min:97.9 F (36.6 C), Max:98.5 F (36.9 C)  Medications reviewed and include: miralax, senokot Labs reviewed: potassium low 3.1, BUN elevated 82 CBG's: 101-166   Diet Order:  Diet NPO time specified  Skin:  Wound (see comment) (stage II sacrum)  Last BM:  2/20  Height:   Ht Readings from Last 1 Encounters:  11/08/15 '5\' 2"'$  (1.575 m)    Weight:   Wt Readings from Last 1 Encounters:  11/14/15 185 lb 13.6 oz (84.3 kg)    Ideal Body Weight:  50 kg  BMI:  Body mass index is 33.98 kg/(m^2).  Estimated Nutritional Needs:   Kcal:  [phone]  Protein:  > 100 grams  Fluid:  > 1.5 L/day  EDUCATION NEEDS:   No education needs identified at this time  [place], [place], [place] Pager [phone] After Hours Pager

## 2015-11-14 NOTE — Progress Notes (Signed)
eLink Physician-Brief Progress Note Patient Name: [name] DOB: 15-Oct-1930 MRN: [id number]   Date of Service  11/14/2015  HPI/Events of Note  Nurse reports dark black BM.  eICU Interventions  Will send stool for occult blood.     Intervention Category Intermediate Interventions: Other:  Lenell Antu 11/14/2015, 9:35 PM

## 2015-11-14 NOTE — Progress Notes (Signed)
Fecal occult blood Negative

## 2015-11-14 NOTE — Care Management Important Message (Signed)
Important Message  Patient Details  Name: [name] MRN: [id number] Date of Birth: 11/04/1930   Medicare Important Message Given:  Yes    Bernadette Hoit 11/14/2015, 10:50 AM

## 2015-11-14 NOTE — Progress Notes (Signed)
eLink Physician-Brief Progress Note Patient Name: BRITTANY OSIER DOB: 15-Feb-1931 MRN: [id number]   Date of Service  11/14/2015  HPI/Events of Note  Multiple issues: 1. Frequent PVC's and some NSVT. Patient is being diuresed. 2. Fentanyl Q 1 hour IV not adequate for sedation.   eICU Interventions  Will order: 1. Check BMP and Mg++ level now.  2. Precedex IV infusion. Titrate to RASS = -1.     Intervention Category Major Interventions: Arrhythmia - evaluation and management Intermediate Interventions: Pain - evaluation and management Minor Interventions: Agitation / anxiety - evaluation and management  Sae Handrich Eugene 11/14/2015, 5:01 PM

## 2015-11-14 NOTE — Progress Notes (Addendum)
Spoke with family. Angelyn Punt (patient's daughter who is health care power of attorney) will be at bedside between 8am and 12pm tomorrow and she wants to talk with CCM about plan for patient.

## 2015-11-14 NOTE — Consult Note (Signed)
Advanced Heart Failure Team Consult Note  Referring Physician: Dr Marchelle Gearing.  Primary Physician: Primary Cardiologist:  Dr. Tresa Endo  Reason for Consultation: Heart Failure   HPI:   [name] is a 82 year old with past medical history of diabetes, stroke, hypertension, anemia, coronary artery disease s/p CABG. She has admissions to [facility] on 1/9 for LLL PNA and then again on 2/9 with UTI for which she was treated with levaquin. After discharge 2/11 the family reports that she is becoming increasingly somnolent with worsening right-sided facial droop and weakness. Admitted to [facility] on 2/13 for evaluation of lethargy. CTA and MRI showed old stroke, age related changes but no acute abnormality. She was placed on BiPAP for hypercarbia however her CO2 continues to raise.   PCCM called for evaluation. Intubated on 11/08/15. Initially started intermittent lasix and diamox. Later she was switched to lasix drip + metolazone. Due to poor diuresis she was started on lasix drip and continued on diamox and metolazone.  On 2/18 she was switched back to intermittent diuresis, Lasix 40 mg IV bid. CXR concerning for pulmonary edema.   Due to recent UTI and LLL PNA she was started on aztreonam and will complete tomorrow.   ECHO 08/2015: EF 50%. ECHO 1/17: EF 40% (echo in [facility]).   ECG:  NSR, RBBB (2/14)  Review of Systems: [y] = yes, [ ]  = no INTUBATED. Information obtained from the chart.   General: Weight gain [ ] ; Weight loss [ ] ; Anorexia [ ] ; Fatigue [ ] ; Fever [ ] ; Chills [ ] ; Weakness [ ]   Cardiac: Chest pain/pressure [ ] ; Resting SOB [ ] ; Exertional SOB [ ] ; Orthopnea [ ] ; Pedal Edema [ ] ; Palpitations [ ] ; Syncope [ ] ; Presyncope [ ] ; Paroxysmal nocturnal dyspnea[ ]   Pulmonary: Cough [ ] ; Wheezing[ ] ; Hemoptysis[ ] ; Sputum [ ] ; Snoring [ ]   GI: Vomiting[ ] ; Dysphagia[ ] ; Melena[ ] ; Hematochezia [ ] ; Heartburn[ ] ; Abdominal pain [ ] ; Constipation [ ] ; Diarrhea [ ] ; BRBPR [  ]  GU: Hematuria[ ] ; Dysuria [ ] ; Nocturia[ ]   Vascular: Pain in legs with walking [ ] ; Pain in feet with lying flat [ ] ; Non-healing sores [ ] ; Stroke [ ] ; TIA [ ] ; Slurred speech [ ] ;  Neuro: Headaches[ ] ; Vertigo[ ] ; Seizures[ ] ; Paresthesias[ ] ;Blurred vision [ ] ; Diplopia [ ] ; Vision changes [ ]   Ortho/Skin: Arthritis [ ] ; Joint pain [ ] ; Muscle pain [ ] ; Joint swelling [ ] ; Back Pain [ ] ; Rash [ ]   Psych: Depression[ ] ; Anxiety[ ]   Heme: Bleeding problems [ ] ; Clotting disorders [ ] ; Anemia [ ]   Endocrine: Diabetes [ ] ; Thyroid dysfunction[ ]   Home Medications Prior to Admission medications   Medication Sig Start Date End Date Taking? Authorizing Provider  albuterol (PROVENTIL) (2.5 MG/3ML) 0.083% nebulizer solution Take 2.5 mg by nebulization 2 (two) times daily.  10/21/15 10/20/16 Yes Historical Provider, MD  albuterol-ipratropium (COMBIVENT) 18-103 MCG/ACT inhaler Inhale 1 puff into the lungs every 4 (four) hours as needed.  10/21/15 10/20/16 Yes Historical Provider, MD  allopurinol (ZYLOPRIM) 100 MG tablet Take 100 mg by mouth daily. 03/08/14  Yes Historical Provider, MD  atorvastatin (LIPITOR) 40 MG tablet Take 1 tablet (40 mg total) by mouth daily. Patient taking differently: Take 40 mg by mouth daily at 6 PM.  10/26/15  Yes Sheliah Hatch, MD  clopidogrel (PLAVIX) 75 MG tablet Take 1 tablet (75 mg total) by mouth daily. 09/12/15  Yes Kelle Darting  Hager, PA-C  colchicine 0.6 MG tablet Take 0.6 mg by mouth daily.    Yes Historical Provider, MD  CRANBERRY CONCENTRATE PO Take 1 tablet by mouth 2 (two) times daily.   Yes Historical Provider, MD  folic acid (FOLVITE) 1 MG tablet TAKE ONE TABLET BY MOUTH ONCE DAILY 09/27/15  Yes Lennette Bihari, MD  isosorbide mononitrate (IMDUR) 60 MG 24 hr tablet Take 1 tablet (60 mg total) by mouth daily. NEED OV. 08/16/15  Yes Lennette Bihari, MD  JANUVIA 50 MG tablet Take 50 mg by mouth daily with breakfast.  04/09/14  Yes Historical Provider, MD  metoprolol  (LOPRESSOR) 50 MG tablet Take 1 tablet (50 mg total) by mouth 2 (two) times daily. 08/16/15  Yes Lennette Bihari, MD  nitroGLYCERIN (NITROSTAT) 0.4 MG SL tablet Place 1 tablet (0.4 mg total) under the tongue as needed. 08/16/15  Yes Lennette Bihari, MD  omega-3 acid ethyl esters (LOVAZA) 1 G capsule TAKE TWO CAPSULES BY MOUTH TWICE DAILY Patient taking differently: Take 1 g by mouth 2 (two) times daily.  08/16/15  Yes Lennette Bihari, MD  pantoprazole (PROTONIX) 40 MG tablet Take 1 tablet (40 mg total) by mouth daily. Make appt for refills. 08/16/15  Yes Lennette Bihari, MD  polysaccharide iron (NIFEREX) 150 MG CAPS capsule Take 150 mg by mouth 2 (two) times daily.    Yes Historical Provider, MD  primidone (MYSOLINE) 50 MG tablet Take 50 mg by mouth at bedtime.    Yes Historical Provider, MD  senna (SENOKOT) 8.6 MG tablet Take 1 tablet by mouth 2 (two) times daily.    Yes Historical Provider, MD  torsemide (DEMADEX) 20 MG tablet Take 2 tablets (40 mg total) by mouth daily. Patient taking differently: Take 20 mg by mouth 2 (two) times daily.  11/01/15  Yes Abelino Derrick, PA-C    Past Medical History: Past Medical History  Diagnosis Date  . Diabetes mellitus   . Hypertension   . Stroke [facility]) Right Side Weakness  . CHF (congestive heart failure) (HCC)     2D ECHO, 09/13/2011 - EF 40-45%, normal  . 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 Pain in limb     BILATERAL EXTREMITY VENOUS DUPLEX, 01/08/2011 - no evidence of deep vein or superficial thrombosis or Baker's cyst    Past Surgical History: Past Surgical History  Procedure Laterality Date  . Cardiac surgery    . Cardiac catheterization  10/27/2004    3 stents placed, 3.5x28 proximally,3.5x33 mid segment, and 3.5x33 in the region of the crux, stenosis being reduced to 0% at proximal and mid segment  and being reduced from 80% ot 10% in the stent at the cruxed segment  . Cardiac catheterization  09/26/2004    High grade 95% ostial stenosis in the RCA with 70% mid stenosis, 95% distal stenosis and total occlusion of the PDA with collaterals  . Coronary artery bypass graft  02/24/1999    x4; IMA to distal LAD, left radial to first circ, SVG to first diagonal, SVG to posterior descending coronary artery    Family History: Family History  Problem Relation Age of Onset  . Cancer Mother     Lung  . Heart disease Brother   . Hypertension Brother   . Heart disease Brother   . Hypertension Brother  Social History: Social History   Social History  . Marital Status: Married    Spouse Name: N/A  . Number of Children: N/A  . Years of Education: N/A   Social History Main Topics  . Smoking status: Never Smoker   . Smokeless tobacco: Never Used  . Alcohol Use: No  . Drug Use: No  . Sexual Activity: No   Other Topics Concern  . None   Social History Narrative    Allergies:  Allergies  Allergen Reactions  . Penicillins Anaphylaxis, Itching and Swelling    Has patient had a PCN reaction causing immediate rash, facial/tongue/throat swelling, SOB or lightheadedness with hypotension: Yes Has patient had a PCN reaction causing severe rash involving mucus membranes or skin necrosis: No  Has patient had a PCN reaction that required hospitalization: Unknown Has patient had a PCN reaction occurring within the last 10 years: No  If all of the above answers are "NO", then may proceed with Cephalosporin use.     Objective:    Vital Signs:   Temp:  [97.9 F (36.6 C)-98.9 F (37.2 C)] 97.9 F (36.6 C) (02/20 0800) Pulse Rate:  [64-126] 88 (02/20 0834) Resp:  [11-24] 21 (02/20 0834) BP: (101-173)/(30-119) 136/108 mmHg (02/20 0834) SpO2:  [91 %-100 %] 100 % (02/20 0834) FiO2 (%):  [40 %] 40 % (02/20 0823) Weight:  [185 lb 13.6 oz (84.3 kg)] 185 lb 13.6 oz (84.3 kg) (02/20 0243) Last  BM Date: 11/14/15  Weight change: Filed Weights   11/12/15 0300 11/13/15 0339 11/14/15 0243  Weight: 190 lb 0.6 oz (86.2 kg) 189 lb 13.1 oz (86.1 kg) 185 lb 13.6 oz (84.3 kg)    Intake/Output:   Intake/Output Summary (Last 24 hours) at 11/14/15 0918 Last data filed at 11/14/15 0800  Gross per 24 hour  Intake   1640 ml  Output   2645 ml  Net  -1005 ml     Physical Exam: Intubated.  General:  Intubated.  HEENT: normal Neck: supple. JVP to jaw . Carotids 2+ bilat; no bruits. No lymphadenopathy or thryomegaly appreciated. Cor: PMI nondisplaced. Regular rate & rhythm. No rubs, gallops or murmurs. Lungs: clear Abdomen: soft, nontender, nondistended. No hepatosplenomegaly. No bruits or masses. Good bowel sounds. Extremities: no cyanosis, clubbing, rash,  R and LLE trace edema. R and LLE SCDs.  Neuro: Intubated opens eyes. Moves all 4 extremities w/o difficulty.   Telemetry: SR PVCs.   Labs: Basic Metabolic Panel:  Recent Labs Lab 11/09/15 0430  11/10/15 0415 11/11/15 0435 11/11/15 1400 11/12/15 0340 11/13/15 0418 11/14/15 0400  NA 135  < > 140 134* 134* 134* 135 134*  K 3.1*  < > 4.3 2.5* 5.2* 3.6 3.3* 3.1*  CL 93*  < > 98* 94* 95* 94* 91* 91*  CO2 28  < > GLUCOSE 112*  < > 175* 157* 145* 177* 159* 161*  BUN 44*  < > 42* 45* 48* 59* 71* 82*  CREATININE 1.29*  < > 1.18* 1.09* 1.13* 1.23* 1.16* 1.09*  CALCIUM 8.3*  < > 8.2* 7.7* 8.0* 8.0* 8.2* 8.3*  MG 1.1*  --  1.6* 1.8  --  2.3  --   --   PHOS 1.8*  --  4.5 3.9  --  4.2  --   --   < > = values in this interval not displayed.  Liver Function Tests:  Recent Labs Lab 11/07/15 1747 11/08/15 0220  AST 64* 64*  ALT 28 29  ALKPHOS 123 131*  BILITOT 0.6 0.9  PROT 6.0* 6.2*  ALBUMIN 2.9* 3.1*   No results for input(s): LIPASE, AMYLASE in the last 168 hours.  Recent Labs Lab 11/08/15 0220 11/09/15 0430 11/10/15 0414  AMMONIA 59* 30 29    CBC:  Recent Labs Lab 11/07/15 1747   11/08/15 0220  11/10/15 0415 11/11/15 0435 11/12/15 0340 11/13/15 0418 11/14/15 0400  WBC 7.9  --  7.8  < > 4.2 4.9 4.4 5.7 4.9  NEUTROABS 5.9  --  6.0  --   --   --   --   --   --   HGB 7.9*  < > 8.5*  < > 7.9* 7.1* 7.0* 7.4* 7.2*  HCT 26.9*  < > 28.2*  < > 26.0* 22.5* 22.7* 24.6* 23.2*  MCV 90.6  --  91.6  < > 88.1 87.5 88.0 86.9 86.9  PLT 101*  --  97*  < > 77* 82* 88* 126* 116*  < > = values in this interval not displayed.  Cardiac Enzymes:  Recent Labs Lab 11/08/15 0220 11/08/15 1257 11/09/15 0430  TROPONINI 0.66* 0.54* 0.52*    BNP: BNP (last 3 results)  Recent Labs  09/02/15 0715 11/08/15 0220  BNP 998.9* 2328.7*    ProBNP (last 3 results) No results for input(s): PROBNP in the last 8760 hours.   CBG:  Recent Labs Lab 11/13/15 1217 11/13/15 1632 11/13/15 2020 11/13/15 2337 11/14/15 0403  GLUCAP 179* 171* 173* 154* 153*    Coagulation Studies: No results for input(s): LABPROT, INR in the last 72 hours.  Other results: EKG:  Imaging: Dg Chest Port 1 View  11/14/2015  CLINICAL DATA:  Respiratory failure. EXAM: PORTABLE CHEST 1 VIEW COMPARISON:  11/13/2015. FINDINGS: Endotracheal tube 7 mm above the carina. Proximal repositioning again suggested. Left IJ line with the tube in stable position. Prior CABG. Cardiomegaly with bilateral pulmonary infiltrates consistent pulmonary edema again noted. Bilateral pneumonia cannot be excluded. Low lung volumes. Small pleural effusions noted. IMPRESSION: 1. Endotracheal tube tip noted 7 mm above the lower portion of the carina. Again proximal repositioning suggested. The left IJ line NG tube in stable position. 2. Prior CABG. Cardiomegaly with bilateral pulmonary infiltrates and pleural effusions consistent congestive heart failure again noted. Bilateral pneumonia cannot be excluded. Low lung volumes. These results will be called to the ordering clinician or representative by the Radiologist Assistant, and  communication documented in the PACS or zVision Dashboard. Electronically Signed   By: Maisie Fus  Register   On: 11/14/2015 07:02   Dg Chest Port 1 View  11/13/2015  CLINICAL DATA:  Acute respiratory failure.  Intubated patient. EXAM: PORTABLE CHEST 1 VIEW COMPARISON:  11/12/2015 FINDINGS: Endotracheal tube tip now projects at the chronic. Recommend retracting 1-2 cm. Left internal jugular central venous line and the nasal/ orogastric tube are stable and well positioned. Bilateral lower lung zone airspace opacities are similar to the prior exam allowing for differences in patient positioning. Findings consistent with a combination of atelectasis, residual edema and pleural effusions. Cardiac silhouette is enlarged but stable changes from previous CABG surgery. No pneumothorax. IMPRESSION: 1. Endotracheal tube tip now projects pathic carina. Consider retracting 1-2 cm for more optimal positioning. 2. No significant change in lung aeration since the previous day's study. Electronically Signed   By: Amie Portland M.D.   On: 11/13/2015 07:28      Medications:     Current Medications: . albuterol  2.5 mg Nebulization  BID  . antiseptic oral rinse  7 mL Mouth Rinse 10 times per day  . aspirin  325 mg Per Tube Daily  . aztreonam  1 g Intravenous 3 times per day  . chlorhexidine gluconate  15 mL Mouth Rinse BID  . feeding supplement (VITAL HIGH PROTEIN)  1,000 mL Per Tube Q24H  . furosemide  80 mg Intravenous 3 times per day  . insulin aspart  0-9 Units Subcutaneous Q4H  . metoprolol tartrate  25 mg Per Tube BID  . pantoprazole sodium  40 mg Per Tube Q1200  . polyethylene glycol  17 g Oral Daily  . primidone  50 mg Oral QHS  . senna-docusate  2 tablet Oral BID  . sodium chloride flush  10-40 mL Intracatheter Q12H  . sodium chloride flush  3 mL Intravenous Q12H     Infusions: . sodium chloride 10 mL/hr at 11/14/15 0800      Assessment/Plan   1. Hypercarbic Respiratory Failure: Intubated 2/14.   ?Baseline OSA.  2. Acute on chronic systolic CHF: Ischemic cardiomyopathy.  Most recent echo was in 1/17 but at [facility], EF per report is 40%.  On exam, she is volume overloaded.  She got Lasix 40 mg IV bid yesterday.  Pulmonary edema on CXR.  BUN has been rising.  - Set up CVP.  - Lasix 80 mg IV every 8 hrs. - Check co-ox now.  - If diureses poorly and CVP high, may need to consider low dose dopamine gtt with BUN very high versus milrinone if co-ox and EF low.   - Coreg 3.125 bid, hold off on ACEI for now with elevated BUN.  3. CAD S/P CABG: CABG in June 2000, with the LIMA to LAD, left radial artery to the OM, SVG to the PDA and SVG to the diagonal. LHC  2006 - PCI to SVG to PDA. Myoview  2013 which showed normal perfusion.  - On aspirin. Cut back to 81 mg daily.  - Continue home statin.  4. Anemia: Hgb 7.2 . No evidence of bleeding. This has been chronic and followed by hematology.  5. Hypokalemia: Supplement K.  6. H/o CVA 7. ID: Recent UTI and PNA treated at [facility], she is on a course of antibiotics.   Length of Stay: 7  Monique Clegg [name]  11/14/2015, 9:18 AM  Advanced Heart Failure Team Pager [phone] (M-F; 7a - 4p)  Please contact [facility] for night-coverage after hours (4p -7a ) and weekends on [email or web address]  Patient seen with NP, agree with the above note.  I made changes above reflecting my thoughts.   82 yo with history of ischemic cardiomyopathy, CAD s/p CABG, CKD, and CVA presented with acute hypercarbic respiratory failure and volume overload. CXR wet.  BNP elevated.  She was intubated.  On exam, she remains volume overloaded.  BUN has risen with diuresis. Recent treatment for UTI and PNA at [facility].    Given volume overload, agree with Lasix 80 mg IV every 8 hrs for now.  Will need to follow CVP and check co-ox.  I am going to get an echo as well.  - If low co-ox and low EF, would add milrinone.  - If co-ox not low, could consider dopamine if renal  function continues to worsen.   No evidence for ACS, continue ASA and statin.   Hemoglobin low but stable, no signs of overt bleeding.  Transfuse hemoglobin < 7.   [name] 11/14/2015 9:59 AM

## 2015-11-15 ENCOUNTER — Inpatient Hospital Stay (HOSPITAL_COMMUNITY): Payer: Medicare Other

## 2015-11-15 DIAGNOSIS — J9601 Acute respiratory failure with hypoxia: Secondary | ICD-10-CM

## 2015-11-15 DIAGNOSIS — Z9861 Coronary angioplasty status: Secondary | ICD-10-CM

## 2015-11-15 DIAGNOSIS — I509 Heart failure, unspecified: Secondary | ICD-10-CM

## 2015-11-15 DIAGNOSIS — I2510 Atherosclerotic heart disease of native coronary artery without angina pectoris: Secondary | ICD-10-CM

## 2015-11-15 DIAGNOSIS — J9600 Acute respiratory failure, unspecified whether with hypoxia or hypercapnia: Secondary | ICD-10-CM | POA: Insufficient documentation

## 2015-11-15 LAB — GLUCOSE, CAPILLARY
GLUCOSE-CAPILLARY: 149 mg/dL — AB (ref 65–99)
GLUCOSE-CAPILLARY: 148 mg/dL — AB (ref 65–99)
Glucose-Capillary: 158 mg/dL — ABNORMAL HIGH (ref 65–99)
Glucose-Capillary: 161 mg/dL — ABNORMAL HIGH (ref 65–99)
Glucose-Capillary: 162 mg/dL — ABNORMAL HIGH (ref 65–99)
Glucose-Capillary: 168 mg/dL — ABNORMAL HIGH (ref 65–99)
Glucose-Capillary: 178 mg/dL — ABNORMAL HIGH (ref 65–99)

## 2015-11-15 LAB — CBC
HCT: 26.4 % — ABNORMAL LOW (ref 36.0–46.0)
HEMATOCRIT: 21.8 % — AB (ref 36.0–46.0)
HEMOGLOBIN: 8.3 g/dL — AB (ref 12.0–15.0)
Hemoglobin: 6.6 g/dL — CL (ref 12.0–15.0)
MCH: 26.6 pg (ref 26.0–34.0)
MCH: 28.1 pg (ref 26.0–34.0)
MCHC: 30.3 g/dL (ref 30.0–36.0)
MCHC: 31.4 g/dL (ref 30.0–36.0)
MCV: 87.9 fL (ref 78.0–100.0)
MCV: 89.5 fL (ref 78.0–100.0)
PLATELETS: 118 10*3/uL — AB (ref 150–400)
PLATELETS: 115 10*3/uL — AB (ref 150–400)
RBC: 2.48 MIL/uL — ABNORMAL LOW (ref 3.87–5.11)
RBC: 2.95 MIL/uL — AB (ref 3.87–5.11)
RDW: 20.2 % — AB (ref 11.5–15.5)
RDW: 19.2 % — ABNORMAL HIGH (ref 11.5–15.5)
WBC: 4.2 10*3/uL (ref 4.0–10.5)
WBC: 6.5 10*3/uL (ref 4.0–10.5)

## 2015-11-15 LAB — CARBOXYHEMOGLOBIN
Carboxyhemoglobin: 2 % — ABNORMAL HIGH (ref 0.5–1.5)
Methemoglobin: 0.9 % (ref 0.0–1.5)
O2 SAT: 70.5 %
Total hemoglobin: 6.7 g/dL — CL (ref 12.0–16.0)

## 2015-11-15 LAB — BASIC METABOLIC PANEL
Anion gap: 14 (ref 5–15)
BUN: 93 mg/dL — AB (ref 6–20)
CALCIUM: 8.2 mg/dL — AB (ref 8.9–10.3)
CO2: 30 mmol/L (ref 22–32)
CREATININE: 1.06 mg/dL — AB (ref 0.44–1.00)
Chloride: 92 mmol/L — ABNORMAL LOW (ref 101–111)
GFR calc Af Amer: 55 mL/min — ABNORMAL LOW (ref >60)
GFR, EST NON AFRICAN AMERICAN: 48 mL/min — AB (ref >60)
GLUCOSE: 181 mg/dL — AB (ref 65–99)
POTASSIUM: 3.6 mmol/L (ref 3.5–5.1)
Sodium: 136 mmol/L (ref 135–145)

## 2015-11-15 LAB — MAGNESIUM: MAGNESIUM: 2.2 mg/dL (ref 1.7–2.4)

## 2015-11-15 LAB — PREPARE RBC (CROSSMATCH)

## 2015-11-15 LAB — PHOSPHORUS: PHOSPHORUS: 4.7 mg/dL — AB (ref 2.5–4.6)

## 2015-11-15 MED ORDER — PERFLUTREN LIPID MICROSPHERE
1.0000 mL | INTRAVENOUS | Status: AC | PRN
  Administered 2015-11-15: 2 mL via INTRAVENOUS
  Filled 2015-11-15: qty 10

## 2015-11-15 MED ORDER — SODIUM CHLORIDE 0.9 % IV SOLN: Freq: Once | INTRAVENOUS | Status: DC

## 2015-11-15 MED ORDER — PERFLUTREN LIPID MICROSPHERE
INTRAVENOUS | Status: AC
  Filled 2015-11-15: qty 10

## 2015-11-15 NOTE — Progress Notes (Signed)
CXR this AM showing endotracheal tube tip again 7 mm above the carina. RT made aware, will address with MD on morning rounds.

## 2015-11-15 NOTE — Progress Notes (Signed)
  Echocardiogram 2D Echocardiogram Definity 2 mL has been performed.  [name] M 11/15/2015, 11:31 AM 

## 2015-11-15 NOTE — Progress Notes (Signed)
RT note- ETT withdrawn 2cm per MD orders.

## 2015-11-15 NOTE — Progress Notes (Signed)
CRITICAL VALUE ALERT  Critical value received:  Hgb 6.7  Date of notification:  11/15/15  Time of notification:  0735  Critical value read back:Yes.    Nurse who received alert:  Toula Moos  MD notified (1st page):  MD already aware, orders for 1 unit PRBC's

## 2015-11-15 NOTE — Progress Notes (Signed)
Advanced Heart Failure Rounding Note   Subjective:   Yesterday diuretics held with CVP 5. Weight down 3 pounds. Hemoglobin down to 6.6. FOBT negative. Remained intubated.   Objective:   Weight Range:  Vital Signs:   Temp:  [97.9 F (36.6 C)-99 F (37.2 C)] 98.9 F (37.2 C) (02/21 0400) Pulse Rate:  [65-112] 71 (02/21 0600) Resp:  [10-27] 14 (02/21 0600) BP: (73-160)/(31-119) 107/40 mmHg (02/21 0600) SpO2:  [100 %] 100 % (02/21 0600) FiO2 (%):  [40 %] 40 % (02/21 0400) Weight:  [186 lb 15.2 oz (84.8 kg)] 186 lb 15.2 oz (84.8 kg) (02/21 0434) Last BM Date: 11/14/15  Weight change: Filed Weights   11/13/15 0339 11/14/15 0243 11/15/15 0434  Weight: 189 lb 13.1 oz (86.1 kg) 185 lb 13.6 oz (84.3 kg) 186 lb 15.2 oz (84.8 kg)    Intake/Output:   Intake/Output Summary (Last 24 hours) at 11/15/15 1030 Last data filed at 11/15/15 0600  Gross per 24 hour  Intake 1800.78 ml  Output   2055 ml  Net -254.22 ml    Physical Exam: Intubated.  CVP 5 General: Intubated.  HEENT: normal Neck: supple. JVP 5-6  Carotids 2+ bilat; no bruits. No lymphadenopathy or thryomegaly appreciated. Cor: PMI nondisplaced. Regular rate & rhythm. No rubs, gallops or murmurs. Lungs: clear Abdomen: soft, nontender, nondistended. No hepatosplenomegaly. No bruits or masses. Good bowel sounds. Extremities: no cyanosis, clubbing, rash, R and LLE edema. R and LLE SCDs.  Neuro: Opens eyes and follows directions. Moves all 4 extremities w/o difficulty.   Telemetry: SR PVCs.    Labs: Basic Metabolic Panel:  Recent Labs Lab 11/09/15 0430  11/10/15 0415 11/11/15 0435  11/12/15 0340 11/13/15 0418 11/14/15 0400 11/14/15 1842 11/15/15 0435  NA 135  < > 140 134*  < > 134* 135 134* 132* 136  K 3.1*  < > 4.3 2.5*  < > 3.6 3.3* 3.1* 3.3* 3.6  CL 93*  < > 98* 94*  < > 94* 91* 91* 90* 92*  CO2 28  < > 28 27  < > _0 GLUCOSE 112*  < > 175* 157*  < > 177* 159* 161* 236* 181*  BUN 44*  <  > 42* 45*  < > 59* 71* 82* 87* 93*  CREATININE 1.29*  < > 1.18* 1.09*  < > 1.23* 1.16* 1.09* 1.07* 1.06*  CALCIUM 8.3*  < > 8.2* 7.7*  < > 8.0* 8.2* 8.3* 8.1* 8.2*  MG 1.1*  --  1.6* 1.8  --  2.3  --   --  2.0 2.2  PHOS 1.8*  --  4.5 3.9  --  4.2  --   --   --  4.7*  < > = values in this interval not displayed.  Liver Function Tests: No results for input(s): AST, ALT, ALKPHOS, BILITOT, PROT, ALBUMIN in the last 168 hours. No results for input(s): LIPASE, AMYLASE in the last 168 hours.  Recent Labs Lab 11/09/15 0430 11/10/15 0414  AMMONIA 30 29    CBC:  Recent Labs Lab 11/11/15 0435 11/12/15 0340 11/13/15 0418 11/14/15 0400 11/15/15 0435  WBC 4.9 4.4 5.7 4.9 4.2  HGB 7.1* 7.0* 7.4* 7.2* 6.6*  HCT 22.5* 22.7* 24.6* 23.2* 21.8*  MCV 87.5 88.0 86.9 86.9 87.9  PLT 82* 88* 126* 116* 118*    Cardiac Enzymes:  Recent Labs Lab 11/08/15 1257 11/09/15 0430  TROPONINI 0.54* 0.52*    BNP: BNP (last  3 results)  Recent Labs  09/02/15 0715 11/08/15 0220  BNP 998.9* 2328.7*    ProBNP (last 3 results) No results for input(s): PROBNP in the last 8760 hours.    Other results:  Imaging: Dg Chest Port 1 View  11/14/2015  CLINICAL DATA:  Respiratory failure. EXAM: PORTABLE CHEST 1 VIEW COMPARISON:  11/13/2015. FINDINGS: Endotracheal tube 7 mm above the carina. Proximal repositioning again suggested. Left IJ line with the tube in stable position. Prior CABG. Cardiomegaly with bilateral pulmonary infiltrates consistent pulmonary edema again noted. Bilateral pneumonia cannot be excluded. Low lung volumes. Small pleural effusions noted. IMPRESSION: 1. Endotracheal tube tip noted 7 mm above the lower portion of the carina. Again proximal repositioning suggested. The left IJ line NG tube in stable position. 2. Prior CABG. Cardiomegaly with bilateral pulmonary infiltrates and pleural effusions consistent congestive heart failure again noted. Bilateral pneumonia cannot be excluded. Low  lung volumes. These results will be called to the ordering clinician or representative by the Radiologist Assistant, and communication documented in the PACS or zVision Dashboard. Electronically Signed   By: [place]   On: 11/14/2015 07:02      Medications:     Scheduled Medications: . sodium chloride   Intravenous Once  . albuterol  2.5 mg Nebulization BID  . antiseptic oral rinse  7 mL Mouth Rinse 10 times per day  . aspirin  81 mg Per Tube Daily  . atorvastatin  40 mg Oral q1800  . aztreonam  1 g Intravenous 3 times per day  . carvedilol  3.125 mg Oral BID WC  . chlorhexidine gluconate  15 mL Mouth Rinse BID  . feeding supplement (VITAL HIGH PROTEIN)  1,000 mL Per Tube Q24H  . insulin aspart  0-9 Units Subcutaneous Q4H  . pantoprazole sodium  40 mg Per Tube Q1200  . polyethylene glycol  17 g Oral Daily  . primidone  50 mg Oral QHS  . senna-docusate  2 tablet Oral BID  . sodium chloride flush  10-40 mL Intracatheter Q12H  . sodium chloride flush  3 mL Intravenous Q12H  . spironolactone  25 mg Per Tube Daily     Infusions: . sodium chloride 10 mL/hr at 11/14/15 2000  . sodium chloride 10 mL/hr at 11/14/15 2150  . dexmedetomidine 0.1 mcg/kg/hr (11/15/15 0227)     PRN Medications:  sodium chloride, fentaNYL (SUBLIMAZE) injection, hydrALAZINE, ipratropium-albuterol, metoprolol, ondansetron **OR** ondansetron (ZOFRAN) IV, sodium chloride flush   Assessment/Plan    1. Hypercarbic Respiratory Failure: Intubated 2/14. ?Baseline OSA. Also concern for LLL PNA.  2. Acute on chronic systolic CHF: Ischemic cardiomyopathy. Most recent echo was in 1/17 but at [facility], EF per report is 40%. Yesterday diuretics held with low CVP. Co-ox good at 71%.  CXR with some improvement in terms of pulmonary edema.  - CVP remains low 5. Hold diuretics today.  - Coreg 3.125 bid, hold off on ACEI for now with elevated BUN.  - Needs echo, ordered.  3. CAD S/P CABG: CABG in June  2000, with the LIMA to LAD, left radial artery to the OM, SVG to the PDA and SVG to the diagonal. LHC 2006 with PCI to SVG to PDA. Myoview 2013 which showed no ischemia or infarction.   - On aspirin. Cut back to 81 mg daily.  - Continue home statin.  4. Anemia: Hgb 7.2>6.6. Receiving 1URPBCs. FOBT negative, has history of chronic anemia followed by hematology.  5. Hypokalemia: Supplement K.  6. H/o CVA  7. ID: Recent UTI and PNA treated at [facility], she is on a course of antibiotics.  ?LLL PNA contributing to current respiratory failure.   Length of Stay: [place] NP-C  11/15/2015, 7:02 AM  Advanced Heart Failure Team Pager [phone] (M-F; 7a - 4p)  Please contact [facility] for night-coverage after hours (4p -7a ) and weekends on [email or web address]  Patient seen with NP, agree with the above note.  CVP low now, diuretics held.  BUN up to 90.  Co-ox is adequate.  Ongoing respiratory failure may be due to LLL PNA +/- OHS/OSA.   - Continue low dose Coreg.  - Needs echo today.  - Wean vent per CCM.   Hemoglobin down to 6.6, FOBT negative.  Has chronic anemia followed by hematology.  Bone marrow biopsy has been considered but does not appear to have been done yet.  She will get 1 unit PRBCs today.   [name] 11/15/2015 8:20 AM

## 2015-11-15 NOTE — Progress Notes (Signed)
PULMONARY / CRITICAL CARE MEDICINE   Name: [name] MRN: [id number] DOB: Jul 26, 1931    ADMISSION DATE:  11/07/2015 CONSULTATION DATE:  11/08/15  REFERRING MD: Lars Masson, A  CHIEF COMPLAINT:  Altered mental status, hypercarbic resp failure.    CULTURES: negative   ANTIBIOTICS: Vanc 02/14 >> 2/16 Azactam 02/14 >> 2/21    LINES/TUBES: ETT 02/14>>> L IJ TLC 2/14>>  SIGNIFICANT EVENTS: CT head 2/13. Chronic age-related changes, old infarcts. No acute abnormality. MRI head 2/13. No acute intracranial process ................................ 2/14  Admit with altered mental status, hypercarbic, failed bipap / intubated - Chest x-ray 2/14 bilateral pleural effusions, basilar opacities 2/17  Remains on lasix gtt, precedex  2/19 :  No acute events overnight.  Pt more alert/awake this am.  Follows commands (when demonstrated first)   11/14/15- pn prn fentanyl. Just started sbt . Daughter at bedside. RN reports patient exhausted and edematous and sometimes agitated . Restart scheduled lasix   SUBJECTIVE/OVERNIGHT/INTERVAL HX 11/15/15 - ET tube too low. Got 1 unit prbc for hg < 7gm%. Stool OB x 1 negative. Failed SBT. Lasix held by cards due o cxr improvement and cVP 5. Improving creat but worsening BUN to 90s. Echo pending.  Daughte rreports patien bed bound and in diapers since 2009 but normal mental status . Mild dysphagia at baseline for hard solids +  VITAL SIGNS: BP 145/49 mmHg  Pulse 87  Temp(Src) 99 F (37.2 C) (Oral)  Resp 21  Ht 5\' 2"  (1.575 m)  Wt 84.8 kg (186 lb 15.2 oz)  BMI 34.18 kg/m2  SpO2 100%  HEMODYNAMICS: CVP:  [4 mmHg-10 mmHg] 4 mmHg  VENTILATOR SETTINGS: Vent Mode:  [-] PSV;PRVC;CPAP FiO2 (%):  [40 %] 40 % Set Rate:  [10 bmp] 10 bmp Vt Set:  [400 mL] 400 mL PEEP:  [5 cmH20] 5 cmH20 Pressure Support:  [8 cmH20] 8 cmH20 Plateau Pressure:  [18 cmH20-22 cmH20] 20 cmH20  INTAKE / OUTPUT: I/O last 3 completed shifts: In: 2672.9 [I.V.:462.9;  NG/GT:1960; IV Piggyback:250] Out: 3380 [Urine:3380]  PHYSICAL EXAMINATION: General: Chronically ill appearing elderly female, on vent. LOOKS DECONDITIONED. LOOKS EXHAUSTED Neuro:  Arousable, parkinsonian, moving all ext to commands RASS 0.  HEENT: [place]/AT, PERRL, EOM-I and MMM. Cardiovascular:  RRR, Nl S1/S2, -M/R/G. Lungs:  Even/non-labored on vent, lungs bilaterally diminished  Abdomen: Soft, NT, ND and +BS. Ext: 1+ edema and -tenderness. Skin: Intact.   LABS:  PULMONARY  Recent Labs Lab 11/08/15 1655 11/09/15 0355 11/10/15 0403 11/11/15 0333 11/12/15 0344 11/14/15 1100 11/15/15 0710  PHART 7.389 7.595* 7.526* 7.482* 7.368  --   --   PCO2ART 53.7* 28.4* 31.1* 35.6 35.8  --   --   PO2ART 84.0 121.0* 144.0* 140* 74.7*  --   --   HCO3 32.6* 27.7* 25.9* 26.2* 21.5  --   --   TCO2 34 29 27 27.3 22.8  --   --   O2SAT 96.0 99.0 100.0 99.2 96.5 77.7 70.5    CBC  Recent Labs Lab 11/13/15 0418 11/14/15 0400 11/15/15 0435  HGB 7.4* 7.2* 6.6*  HCT 24.6* 23.2* 21.8*  WBC 5.7 4.9 4.2  PLT 126* 116* 118*    COAGULATION No results for input(s): INR in the last 168 hours.  CARDIAC    Recent Labs Lab 11/08/15 1257 11/09/15 0430  TROPONINI 0.54* 0.52*   No results for input(s): PROBNP in the last 168 hours.   CHEMISTRY  Recent Labs Lab 11/09/15 0430  11/10/15 0415 11/11/15 0435  11/12/15  0340 11/13/15 0418 11/14/15 0400 11/14/15 1842 11/15/15 0435  NA 135  < > 140 134*  < > 134* 135 134* 132* 136  K 3.1*  < > 4.3 2.5*  < > 3.6 3.3* 3.1* 3.3* 3.6  CL 93*  < > 98* 94*  < > 94* 91* 91* 90* 92*  CO2 28  < > 28 27  < > GLUCOSE 112*  < > 175* 157*  < > 177* 159* 161* 236* 181*  BUN 44*  < > 42* 45*  < > 59* 71* 82* 87* 93*  CREATININE 1.29*  < > 1.18* 1.09*  < > 1.23* 1.16* 1.09* 1.07* 1.06*  CALCIUM 8.3*  < > 8.2* 7.7*  < > 8.0* 8.2* 8.3* 8.1* 8.2*  MG 1.1*  --  1.6* 1.8  --  2.3  --   --  2.0 2.2  PHOS 1.8*  --  4.5 3.9  --  4.2  --   --    --  4.7*  < > = values in this interval not displayed. Estimated Creatinine Clearance: 41.3 mL/min (by C-G formula based on Cr of 1.06).   LIVER No results for input(s): AST, ALT, ALKPHOS, BILITOT, PROT, ALBUMIN, INR in the last 168 hours.   INFECTIOUS  Recent Labs Lab 11/08/15 1257 11/09/15 0430 11/10/15 0415  PROCALCITON 0.37 0.45 0.51     ENDOCRINE CBG (last 3)   Recent Labs  11/15/15 0013 11/15/15 0427 11/15/15 0748  GLUCAP 158* 178* 161*         IMAGING x48h  - image(s) personally visualized  -   highlighted in bold Dg Chest Port 1 View  11/15/2015  CLINICAL DATA:  Intubation. EXAM: PORTABLE CHEST 1 VIEW COMPARISON:  11/14/2015. FINDINGS: Endotracheal tube tip 7 mm again noted 7 mm above the carina. Proximal repositioning should be considered. Left IJ line and NG tube in stable position. Prior CABG. Cardiomegaly pulmonary vascular prominence and interstitial prominence with bilateral effusions. Findings consistent congestive heart failure. Findings have improved slightly from prior exam. No pneumothorax. IMPRESSION: 1. Endotracheal tube tip again 7 mm above the carina. Proximal repositioning should again be considered. 2. Prior CABG. Cardiomegaly with pulmonary vascular congestion, interstitial prominence and bilateral pleural effusions consistent congestive heart failure. Slight interim improvement. These results will be called to the ordering clinician or representative by the Radiologist Assistant, and communication documented in the PACS or zVision Dashboard. Electronically Signed   By: Maisie Fus  Register   On: 11/15/2015 07:14   Dg Chest Port 1 View  11/14/2015  CLINICAL DATA:  Respiratory failure. EXAM: PORTABLE CHEST 1 VIEW COMPARISON:  11/13/2015. FINDINGS: Endotracheal tube 7 mm above the carina. Proximal repositioning again suggested. Left IJ line with the tube in stable position. Prior CABG. Cardiomegaly with bilateral pulmonary infiltrates consistent pulmonary  edema again noted. Bilateral pneumonia cannot be excluded. Low lung volumes. Small pleural effusions noted. IMPRESSION: 1. Endotracheal tube tip noted 7 mm above the lower portion of the carina. Again proximal repositioning suggested. The left IJ line NG tube in stable position. 2. Prior CABG. Cardiomegaly with bilateral pulmonary infiltrates and pleural effusions consistent congestive heart failure again noted. Bilateral pneumonia cannot be excluded. Low lung volumes. These results will be called to the ordering clinician or representative by the Radiologist Assistant, and communication documented in the PACS or zVision Dashboard. Electronically Signed   By: Maisie Fus  Register   On: 11/14/2015 07:02      ASSESSMENT /  PLAN:  PULMONARY A: Hypercarbic respiratory failure - in setting of CHF, effusions and LLL PNA, did not respond to BiPAP; therefore, moved to ICU in PM of 02/14 for intubation. Pleural Effusions   - failed SBT 2/21 due to apnea - appears very deconditioned.   P:   Daily SBT / WUA, - no extubation 11/15/15 Dc all sedatives Wean as able. Pulmonary hygiene. Follow CXR. Albuterol BID  Duoneb Q4 PRN  Lasix as below   CARDIOVASCULAR A:  Coronary artery disease. S/p multiple stents and CABG. Hx HTN, CHF   - siginficant 3rd spacing + , DIAST BP HIGH 11/14/15 -> improved with lasix -> lasix held 2/21 due to rising BUN and CVP 5.Cards caled  P:  Await echo Rest per card  RENAL A:   AKI.  imprved but bun worsening  P:    Correct electrolytes as indicated Follow BMP / UOP   GASTROINTESTINAL A:   GI prophylaxis. Nutrition. Ammonia elevated, likely hepatic congestion. Constipation    - tolerating tF P:   SUP: pantoprazole. TF per nutrition. Senakot 2 tablets BID. Miralax once a day.   HEMATOLOGIC A:   Anemia. Thrombocytopenia. VTE prophylaxis.   - prbc < 7gm% and got 1 unit PRBc - FOBT x 1 negative  P:  Transfuse for Hgb < 7. Monitor platelet  counts. SCD's. Monitor CBC   INFECTIOUS A:   Recent UTI and LLL PNA. P:   Cultures as above  Continue aztreonam, D8/8  ENDOCRINE A:   Diabetes mellitus. P:   SSI.  NEUROLOGIC A:   #Baseline   - old strokes . Bed bound x 2009 but normal conversation. In diapers  #Now Acute hypercarbic + hepatic encephalopathy - failed BiPAP and required intubation PM of 02/14.   - apppears very deconditioned  11/15/15  P Dc all Fentanyl PRN. precedex as needed RASS goal: 0 to -1. Daily WUA. Continue Primidone for parkinson.  FAMILY  - Updates: No family available am 2/19.  Daughter at bedside updated.DPOA Raja updated on phone 11/14/15 - Inter-disciplinary family meet or Palliative Care meeting due by:  2/21. - done with dpoa Raja and RN Scarlette Calico at bedside 11/15/15 - daughter body language is of full code + full medical care. Will explore further in day.    The patient is critically ill with multiple organ systems failure and requires high complexity decision making for assessment and support, frequent evaluation and titration of therapies, application of advanced monitoring technologies and extensive interpretation of multiple databases.   Critical Care Time devoted to patient care services described in this note is  30  Minutes. This time reflects time of care of this signee Dr Kalman Shan. This critical care time does not reflect procedure time, or teaching time or supervisory time of PA/NP/Med student/Med Resident etc but could involve care discussion time    Dr. Kalman Shan, M.D., [facility].C.P Pulmonary and Critical Care Medicine Staff Physician [facility] [place] [facility] Pager: [phone], If no answer or between  15:00h - 7:00h: call [phone]  11/15/2015 9:29 AM

## 2015-11-15 NOTE — Progress Notes (Signed)
  Echocardiogram 2D Echocardiogram Definity 2 mL has been performed.  Leta Jungling M 11/15/2015, 11:31 AM

## 2015-11-15 NOTE — Progress Notes (Signed)
eLink Physician-Brief Progress Note Patient Name: [name] DOB: 02-Sep-1931 MRN: [id number]   Date of Service  11/15/2015  HPI/Events of Note  Hb 6.6.  eICU Interventions  Will transfuse 1 unit PRBC.      Intervention Category Major Interventions: Other:  Mikolaj Woolstenhulme 11/15/2015, 5:21 AM

## 2015-11-16 ENCOUNTER — Inpatient Hospital Stay (HOSPITAL_COMMUNITY): Payer: Medicare Other

## 2015-11-16 ENCOUNTER — Telehealth: Payer: Self-pay | Admitting: *Deleted

## 2015-11-16 LAB — BASIC METABOLIC PANEL
ANION GAP: 11 (ref 5–15)
BUN: 99 mg/dL — ABNORMAL HIGH (ref 6–20)
CALCIUM: 8.5 mg/dL — AB (ref 8.9–10.3)
CO2: 30 mmol/L (ref 22–32)
CREATININE: 0.93 mg/dL (ref 0.44–1.00)
Chloride: 96 mmol/L — ABNORMAL LOW (ref 101–111)
GFR, EST NON AFRICAN AMERICAN: 56 mL/min — AB (ref >60)
GLUCOSE: 146 mg/dL — AB (ref 65–99)
Potassium: 3 mmol/L — ABNORMAL LOW (ref 3.5–5.1)
Sodium: 137 mmol/L (ref 135–145)

## 2015-11-16 LAB — CBC WITH DIFFERENTIAL/PLATELET
BASOS ABS: 0 10*3/uL (ref 0.0–0.1)
BASOS PCT: 0 %
EOS ABS: 0.1 10*3/uL (ref 0.0–0.7)
Eosinophils Relative: 2 %
HEMATOCRIT: 26.2 % — AB (ref 36.0–46.0)
Hemoglobin: 8.1 g/dL — ABNORMAL LOW (ref 12.0–15.0)
Lymphocytes Relative: 20 %
Lymphs Abs: 1.1 10*3/uL (ref 0.7–4.0)
MCH: 27.1 pg (ref 26.0–34.0)
MCHC: 30.9 g/dL (ref 30.0–36.0)
MCV: 87.6 fL (ref 78.0–100.0)
MONO ABS: 0.6 10*3/uL (ref 0.1–1.0)
Monocytes Relative: 10 %
NEUTROS ABS: 3.8 10*3/uL (ref 1.7–7.7)
NEUTROS PCT: 68 %
Platelets: 121 10*3/uL — ABNORMAL LOW (ref 150–400)
RBC: 2.99 MIL/uL — ABNORMAL LOW (ref 3.87–5.11)
RDW: 19.1 % — AB (ref 11.5–15.5)
WBC: 5.6 10*3/uL (ref 4.0–10.5)

## 2015-11-16 LAB — TYPE AND SCREEN
ABO/RH(D): O POS
ANTIBODY SCREEN: POSITIVE
DAT, IGG: POSITIVE
Donor AG Type: NEGATIVE
UNIT DIVISION: 0

## 2015-11-16 LAB — GLUCOSE, CAPILLARY
Glucose-Capillary: 158 mg/dL — ABNORMAL HIGH (ref 65–99)
Glucose-Capillary: 125 mg/dL — ABNORMAL HIGH (ref 65–99)
Glucose-Capillary: 125 mg/dL — ABNORMAL HIGH (ref 65–99)
Glucose-Capillary: 148 mg/dL — ABNORMAL HIGH (ref 65–99)
Glucose-Capillary: 129 mg/dL — ABNORMAL HIGH (ref 65–99)

## 2015-11-16 LAB — CARBOXYHEMOGLOBIN
CARBOXYHEMOGLOBIN: 1.6 % — AB (ref 0.5–1.5)
METHEMOGLOBIN: 0.7 % (ref 0.0–1.5)
O2 SAT: 70.4 %
Total hemoglobin: 8.4 g/dL — ABNORMAL LOW (ref 12.0–16.0)

## 2015-11-16 LAB — MAGNESIUM: Magnesium: 2.4 mg/dL (ref 1.7–2.4)

## 2015-11-16 LAB — PHOSPHORUS: Phosphorus: 4.3 mg/dL (ref 2.5–4.6)

## 2015-11-16 LAB — OCCULT BLOOD X 1 CARD TO LAB, STOOL: Fecal Occult Bld: NEGATIVE

## 2015-11-16 MED ORDER — MIDAZOLAM HCL 2 MG/2ML IJ SOLN
4.0000 mg | Freq: Once | INTRAMUSCULAR | Status: DC
  Administered 2015-11-17: 4 mg via INTRAVENOUS

## 2015-11-16 MED ORDER — POTASSIUM CHLORIDE CRYS ER 20 MEQ PO TBCR
40.0000 meq | EXTENDED_RELEASE_TABLET | Freq: Two times a day (BID) | ORAL | Status: DC
  Administered 2015-11-16: 40 meq via ORAL
  Filled 2015-11-16: qty 2

## 2015-11-16 MED ORDER — MIDAZOLAM HCL 2 MG/2ML IJ SOLN
1.0000 mg | INTRAMUSCULAR | Status: DC | PRN
  Administered 2015-11-17: 1 mg via INTRAVENOUS
  Administered 2015-11-17 (×2): 2 mg via INTRAVENOUS
  Administered 2015-11-18: 1 mg via INTRAVENOUS
  Administered 2015-11-19 – 2015-11-20 (×3): 2 mg via INTRAVENOUS
  Filled 2015-11-16: qty 4
  Filled 2015-11-16 (×8): qty 2

## 2015-11-16 MED ORDER — PANTOPRAZOLE SODIUM 40 MG IV SOLR
40.0000 mg | INTRAVENOUS | Status: DC
  Administered 2015-11-17 – 2015-11-24 (×8): 40 mg via INTRAVENOUS
  Filled 2015-11-16 (×7): qty 40

## 2015-11-16 MED ORDER — FUROSEMIDE 10 MG/ML IJ SOLN
INTRAMUSCULAR | Status: AC
  Filled 2015-11-16: qty 4

## 2015-11-16 MED ORDER — CETYLPYRIDINIUM CHLORIDE 0.05 % MT LIQD
7.0000 mL | Freq: Two times a day (BID) | OROMUCOSAL | Status: DC
  Administered 2015-11-16 (×2): 7 mL via OROMUCOSAL

## 2015-11-16 MED ORDER — FUROSEMIDE 10 MG/ML IJ SOLN
40.0000 mg | Freq: Once | INTRAMUSCULAR | Status: AC
  Administered 2015-11-16: 40 mg via INTRAVENOUS

## 2015-11-16 MED ORDER — CHLORHEXIDINE GLUCONATE 0.12 % MT SOLN
15.0000 mL | Freq: Two times a day (BID) | OROMUCOSAL | Status: DC
  Administered 2015-11-16 (×2): 15 mL via OROMUCOSAL

## 2015-11-16 MED ORDER — ACETAMINOPHEN 160 MG/5ML PO SOLN: 650.0000 mg | ORAL | Status: DC | PRN

## 2015-11-16 MED ORDER — FUROSEMIDE 10 MG/ML IJ SOLN
40.0000 mg | Freq: Once | INTRAMUSCULAR | Status: AC
  Administered 2015-11-16: 40 mg via INTRAVENOUS
  Filled 2015-11-16: qty 4

## 2015-11-16 MED ORDER — FERROUS SULFATE 300 (60 FE) MG/5ML PO SYRP
300.0000 mg | ORAL_SOLUTION | Freq: Every day | ORAL | Status: DC
  Administered 2015-11-17 – 2015-11-20 (×4): 300 mg
  Filled 2015-11-16 (×7): qty 5

## 2015-11-16 MED ORDER — POTASSIUM CHLORIDE 10 MEQ/100ML IV SOLN: 10.0000 meq | INTRAVENOUS | Status: DC

## 2015-11-16 MED ORDER — FUROSEMIDE 10 MG/ML IJ SOLN
INTRAMUSCULAR | Status: AC
  Administered 2015-11-16: 40 mg via INTRAVENOUS
  Filled 2015-11-16: qty 4

## 2015-11-16 MED ORDER — FERROUS SULFATE 220 (44 FE) MG/5ML PO ELIX
220.0000 mg | ORAL_SOLUTION | Freq: Every day | ORAL | Status: DC
  Filled 2015-11-16: qty 5

## 2015-11-16 MED ORDER — ATROPINE SULFATE 0.1 MG/ML IJ SOLN
INTRAMUSCULAR | Status: AC
  Filled 2015-11-16: qty 10

## 2015-11-16 MED ORDER — ETOMIDATE 2 MG/ML IV SOLN
25.0000 mg | Freq: Once | INTRAVENOUS | Status: AC
  Administered 2015-11-16: 25 mg via INTRAVENOUS

## 2015-11-16 MED ORDER — CHLORHEXIDINE GLUCONATE 0.12% ORAL RINSE (MEDLINE KIT): 15.0000 mL | Freq: Two times a day (BID) | OROMUCOSAL | Status: DC

## 2015-11-16 MED ORDER — FENTANYL CITRATE (PF) 100 MCG/2ML IJ SOLN
INTRAMUSCULAR | Status: AC
  Administered 2015-11-16: 100 ug
  Filled 2015-11-16: qty 2

## 2015-11-16 MED ORDER — FENTANYL CITRATE (PF) 100 MCG/2ML IJ SOLN
25.0000 ug | INTRAMUSCULAR | Status: DC | PRN
  Administered 2015-11-17 – 2015-11-19 (×7): 50 ug via INTRAVENOUS
  Filled 2015-11-16 (×9): qty 2

## 2015-11-16 MED ORDER — MIDAZOLAM HCL 2 MG/2ML IJ SOLN
INTRAMUSCULAR | Status: AC
  Filled 2015-11-16: qty 2

## 2015-11-16 MED ORDER — MIDAZOLAM HCL 2 MG/2ML IJ SOLN
INTRAMUSCULAR | Status: AC
  Administered 2015-11-17: 2 mg
  Filled 2015-11-16: qty 2

## 2015-11-16 MED ORDER — ANTISEPTIC ORAL RINSE SOLUTION (CORINZ): 7.0000 mL | Freq: Four times a day (QID) | OROMUCOSAL | Status: DC

## 2015-11-16 MED ORDER — ROCURONIUM BROMIDE 50 MG/5ML IV SOLN
50.0000 mg | Freq: Once | INTRAVENOUS | Status: DC
  Filled 2015-11-16: qty 5

## 2015-11-16 NOTE — Progress Notes (Signed)
eLink Physician-Brief Progress Note Patient Name: [name] DOB: Dec 18, 1930 MRN: [id number]   Date of Service  11/16/2015  HPI/Events of Note  Increased WOB, Sat = 90% and RR = 24. CVP = 8.   eICU Interventions  Supect that she is on a slow downward spiral and may require reintubation. Spoke with Dr. Jamison Neighbor who is in the Hospital and he will swing by and look at her.      Intervention Category Intermediate Interventions: Respiratory distress - evaluation and management  Sommer,Steven Dennard Nip 11/16/2015, 10:51 PM

## 2015-11-16 NOTE — Procedures (Signed)
Extubation Procedure Note  Patient Details:   Name: [name] DOB: 23-Mar-1931 MRN: [id number]   Airway Documentation:  Airway 7.5 mm (Active)  Secured at (cm) 23 cm 11/16/2015  7:52 AM  Measured From Lips 11/16/2015  7:52 AM  Secured Location Center 11/16/2015  7:52 AM  Secured By [employer] 11/16/2015  7:52 AM  Tube Holder Repositioned Yes 11/16/2015  7:52 AM  Cuff Pressure (cm H2O) 24 cm H2O 11/16/2015  3:30 AM  Site Condition Dry 11/16/2015  7:52 AM    Evaluation  O2 sats: stable throughout Complications: No apparent complications Patient did tolerate procedure well. Bilateral Breath Sounds: Clear, Diminished Suctioning: Oral, Airway Yes   Patient was extubated to bi-pap. Patient vomited while the bi-pap was on after 1 minute. RT took patient off bi-pap and placed her on a 6L [place] and then titrated down to a 4L [place]. RN and patients family member was at the bedside with RT during extubation. RT will continue to monitor.  Darolyn Rua 11/16/2015, 11:35 AM

## 2015-11-16 NOTE — Progress Notes (Signed)
PULMONARY / CRITICAL CARE MEDICINE   Name: [name] MRN: [id number] DOB: [birth year]    ADMISSION DATE:  11/07/2015 CONSULTATION DATE:  11/08/15 LOS 9 das  REFERRING MD: Lars Masson, A  CHIEF COMPLAINT:  Altered mental status, hypercarbic resp failure.    CULTURES: negative   ANTIBIOTICS: Vanc 02/14 >> 2/16 Azactam 02/14 >> 2/21    LINES/TUBES: ETT 02/14>>> L IJ TLC 2/14>>  SIGNIFICANT EVENTS: CT head 2/13. Chronic age-related changes, old infarcts. No acute abnormality. MRI head 2/13. No acute intracranial process ................................ 2/14  Admit with altered mental status, hypercarbic, failed bipap / intubated - Chest x-ray 2/14 bilateral pleural effusions, basilar opacities 2/17  Remains on lasix gtt, precedex  2/19 :  No acute events overnight.  Pt more alert/awake this am.  Follows commands (when demonstrated first)   11/14/15- pn prn fentanyl. Just started sbt . Daughter at bedside. RN reports patient exhausted and edematous and sometimes agitated . Restart scheduled lasix    11/15/15 - ET tube too low. Got 1 unit prbc for hg < 7gm%. Stool OB x 1 negative. Failed SBT. Lasix held by cards due o cxr improvement and cVP 5. Improving creat but worsening BUN to 90s. Echo pending.  Daughte rreports patien bed bound and in diapers since 2009 but normal mental status . Mild dysphagia at baseline for hard solids +   SUBJECTIVE/OVERNIGHT/INTERVAL HX 11/16/15 - EF 35%. CVP up again and cards gave 1 dose lasix. BUN still high 80s. CXr stil congested. Doing SBT x 3h off precedex. Normal WUA per RN. Looks exhausted . FOBT x 2 ngative . DPOA indicating full code + full med care  VITAL SIGNS: BP 154/43 mmHg  Pulse 79  Temp(Src) 98.3 F (36.8 C) (Oral)  Resp 25  Ht  (1.575 m)  Wt 85.9 kg (189 lb 6 oz)  BMI 34.63 kg/m2  SpO2 100%  HEMODYNAMICS: CVP:  [9 mmHg-16 mmHg] 9 mmHg  VENTILATOR SETTINGS: Vent Mode:  [-] PSV;CPAP FiO2 (%):  [40 %] 40 % Set  Rate:  [10 bmp] 10 bmp Vt Set:  [400 mL] 400 mL PEEP:  [5 cmH20] 5 cmH20 Pressure Support:  [12 cmH20] 12 cmH20 Plateau Pressure:  [18 cmH20-19 cmH20] 18 cmH20  INTAKE / OUTPUT: I/O last 3 completed shifts: In: 2807.7 [I.V.:372.7; Blood:335; NG/GT:2050; IV Piggyback:50] Out: 2375 [Urine:2375]  PHYSICAL EXAMINATION: General: Chronically ill appearing elderly female, on vent. LOOKS DECONDITIONED. LOOKS EXHAUSTED BUT BETTER Neuro:  Arousable, parkinsonian, moving all ext to commands RASS +1. More awake HEENT: [place]/AT, PERRL, EOM-I and MMM. Cardiovascular:  RRR, Nl S1/S2, -M/R/G. Lungs:  Even/non-labored on vent, lungs bilaterally diminished  Abdomen: Soft, NT, ND and +BS. Ext: 1+ edema and -tenderness. Skin: Intact.   LABS:  PULMONARY  Recent Labs Lab 11/10/15 0403 11/11/15 0333 11/12/15 0344 11/14/15 1100 11/15/15 0710 11/16/15 0435  PHART 7.526* 7.482* 7.368  --   --   --   PCO2ART 31.1* 35.6 35.8  --   --   --   PO2ART 144.0* 140* 74.7*  --   --   --   HCO3 25.9* 26.2* 21.5  --   --   --   TCO2 27 27.3 22.8  --   --   --   O2SAT 100.0 99.2 96.5 77.7 70.5 70.4    CBC  Recent Labs Lab 11/15/15 0435 11/15/15 1645 11/16/15 0446  HGB 6.6* 8.3* 8.1*  HCT 21.8* 26.4* 26.2*  WBC 4.2 6.5 5.6  PLT 118* 115* 121*  COAGULATION No results for input(s): INR in the last 168 hours.  CARDIAC   No results for input(s): TROPONINI in the last 168 hours. No results for input(s): PROBNP in the last 168 hours.   CHEMISTRY  Recent Labs Lab 11/10/15 0415 11/11/15 0435  11/12/15 0340 11/13/15 0418 11/14/15 0400 11/14/15 1842 11/15/15 0435 11/16/15 0446  NA 140 134*  < > 134* 135 134* 132* 136 137  K 4.3 2.5*  < > 3.6 3.3* 3.1* 3.3* 3.6 3.0*  CL 98* 94*  < > 94* 91* 91* 90* 92* 96*  CO2 28 27  < > GLUCOSE 175* 157*  < > 177* 159* 161* 236* 181* 146*  BUN 42* 45*  < > 59* 71* 82* 87* 93* 99*  CREATININE 1.18* 1.09*  < > 1.23* 1.16* 1.09* 1.07*  1.06* 0.93  CALCIUM 8.2* 7.7*  < > 8.0* 8.2* 8.3* 8.1* 8.2* 8.5*  MG 1.6* 1.8  --  2.3  --   --  2.0 2.2 2.4  PHOS 4.5 3.9  --  4.2  --   --   --  4.7* 4.3  < > = values in this interval not displayed. Estimated Creatinine Clearance: 47.4 mL/min (by C-G formula based on Cr of 0.93).   LIVER No results for input(s): AST, ALT, ALKPHOS, BILITOT, PROT, ALBUMIN, INR in the last 168 hours.   INFECTIOUS  Recent Labs Lab 11/10/15 0415  PROCALCITON 0.51     ENDOCRINE CBG (last 3)   Recent Labs  11/15/15 2309 11/16/15 0315 11/16/15 0718  GLUCAP 162* 158* 125*         IMAGING x48h  - image(s) personally visualized  -   highlighted in bold Dg Chest Port 1 View  11/16/2015  CLINICAL DATA:  Hypoxia EXAM: PORTABLE CHEST 1 VIEW COMPARISON:  November 15, 2015 FINDINGS: Endotracheal tube tip is 1.3 cm above the carina. Nasogastric tube tip and side port are in the stomach. Central catheter tip is in the superior vena cava. No pneumothorax. There is bibasilar atelectatic change with small pleural effusions bilaterally, stable. Heart is enlarged. There is mild pulmonary venous hypertension. There is atherosclerotic calcification aorta. Patient is status post coronary artery bypass grafting. No adenopathy evident. IMPRESSION: Tube and catheter positions as described without pneumothorax. Evidence of a degree of congestive heart failure, unchanged. No new opacity. No change in cardiac silhouette. Electronically Signed   By: Bretta Bang III M.D.   On: 11/16/2015 07:16   Dg Chest Port 1 View  11/15/2015  CLINICAL DATA:  Intubation. EXAM: PORTABLE CHEST 1 VIEW COMPARISON:  11/14/2015. FINDINGS: Endotracheal tube tip 7 mm again noted 7 mm above the carina. Proximal repositioning should be considered. Left IJ line and NG tube in stable position. Prior CABG. Cardiomegaly pulmonary vascular prominence and interstitial prominence with bilateral effusions. Findings consistent congestive heart  failure. Findings have improved slightly from prior exam. No pneumothorax. IMPRESSION: 1. Endotracheal tube tip again 7 mm above the carina. Proximal repositioning should again be considered. 2. Prior CABG. Cardiomegaly with pulmonary vascular congestion, interstitial prominence and bilateral pleural effusions consistent congestive heart failure. Slight interim improvement. These results will be called to the ordering clinician or representative by the Radiologist Assistant, and communication documented in the PACS or zVision Dashboard. Electronically Signed   By: Maisie Fus  Register   On: 11/15/2015 07:14      ASSESSMENT / PLAN:  PULMONARY A: Hypercarbic respiratory failure - in setting of CHF,  effusions and LLL PNA, did not respond to BiPAP; therefore, moved to ICU in PM of 02/14 for intubation. Pleural Effusions   - meets extubaton criteria but at risk for reintubation due to cardio-renal syndrome and baseline bed bound + age  P:   Extubate 11/16/2015 -> Bipap prn -> reintubate if needed (full code per daughter)  Albuterol BID  Duoneb Q4 PRN  Lasix as below   CARDIOVASCULAR A:  Coronary artery disease. S/p multiple stents and CABG. Hx HTN, CHF   - siginficant 3rd spacing + , Systolic CHF acute on chronic - s/p repeat lasix x 1 2/22   P:  Repeat lasix x 1 Rest per card  RENAL A:   AKI.  imprved but bun worsening. FOBT x 2 negative  P:   Correct electrolytes as indicated Follow BMP / UOP   GASTROINTESTINAL A:   GI prophylaxis. Nutrition. Ammonia elevated, likely hepatic congestion. Constipation    -  tF on hold  P:   SUP: pantoprazole. Dc TF NPO post exutbation Will need swallow eval. Senakot 2 tablets BID. Miralax once a day.   HEMATOLOGIC A:   Anemia. Thrombocytopenia. VTE prophylaxis.   - prbc < 7gm% and got 1 unit PRBc - FOBT x 2 negative  P:  Transfuse for Hgb < 7. Monitor platelet counts. SCD's. Monitor CBC   INFECTIOUS A:   Recent UTI and  LLL PNA. S/p azactam ending 11/15/15 P:   Cultures as above  Continue aztreonam, D8/8  ENDOCRINE A:   Diabetes mellitus. P:   SSI.  NEUROLOGIC A:   #Baseline   - old strokes . Bed bound x 2009 but normal conversation. In diapers  #Now Acute hypercarbic + hepatic encephalopathy - failed BiPAP and required intubation PM of 02/14.   - apppears very deconditioned  11/15/15 and 11/16/15 but following commands  P Dc precedex as needed RASS goal: 0 to -1. Daily WUA. Continue Primidone for parkinson.  FAMILY  - Updates: No family available am 2/19.  Daughter at bedside updated.DPOA Raja updated on phone 11/14/15  - Inter-disciplinary family meet or Palliative Care meeting due by:  2/21. - done with dpoa Raja and RN Scarlette Calico at bedside 11/15/15 - daughter body language is of full code + full medical care. Discussed IDT again with RN Scarlette Calico -> DPOA says "this is my mom and you have to do everything". Reintubation ok.   GLOBAL Extubate 11/16/2015 and then reintubate if worsens.     The patient is critically ill with multiple organ systems failure and requires high complexity decision making for assessment and support, frequent evaluation and titration of therapies, application of advanced monitoring technologies and extensive interpretation of multiple databases.   Critical Care Time devoted to patient care services described in this note is  30  Minutes. This time reflects time of care of this signee Dr Kalman Shan. This critical care time does not reflect procedure time, or teaching time or supervisory time of PA/NP/Med student/Med Resident etc but could involve care discussion time    Dr. Kalman Shan, M.D., [facility].C.P Pulmonary and Critical Care Medicine Staff Physician [facility] [place] [facility] Pager: [phone], If no answer or between  15:00h - 7:00h: call [phone]  11/16/2015 10:20 AM

## 2015-11-16 NOTE — Progress Notes (Signed)
PCCM Attending Note: Called to patient's bedside regarding increased work of breathing and hemoptysis. Patient using accessory muscles at bedside. Hemoptysis of unclear etiology but does appear to be true hemoptysis given findings on intubation. Checking coags & hemoglobin/hematocrit. Postprocedure chest x-ray pending. After patient was successfully intubated she became very difficult to ventilate and oxygenate resulting in worsening bradycardia with hypoxia. Patient never lost pulse. 1 mg of IV atropine & 1 mg IV epinephrine was administered emergently. CODE BLUE was called for additional assistance. Endotracheal tube was removed with some small blood clots. Patient was subsequently reintubated by myself and emergent bedside bronchoscopy was performed with subsequent removal of a large piece of endotracheal tissue or clot. A G-tube placed by Joneen Roach at bedside for gastric decompression. Patient's daughter updated at bedside regarding procedures and findings. Tissue being sent to pathology for review.  I have spent a total of 10 minutes of critical care time independent of time spent during procedures caring for the patient.  Donna Christen Jamison Neighbor, M.D. [place] [facility] Pager:  [phone] After 3pm or if no response, call [phone]

## 2015-11-16 NOTE — Progress Notes (Signed)
RT went to notify MD that patient vomited a small amount of frank blood and was not able to wear bi-pap. RT will continue to monitor.

## 2015-11-16 NOTE — Telephone Encounter (Signed)
Received Home Health Certification and Plan of Care; forwarded to provider/SLS 02/22

## 2015-11-16 NOTE — Progress Notes (Signed)
eLink Physician-Brief Progress Note Patient Name: [name] DOB: 1931/01/31 MRN: [id number]   Date of Service  11/16/2015  HPI/Events of Note  Patient desaturating and coughing up blood. Extubated earlier today.   eICU Interventions  Will send Rutherford Guys, PA to evaluate at bedside.      Intervention Category Major Interventions: Hypoxemia - evaluation and management  Lenell Antu 11/16/2015, 3:39 PM

## 2015-11-16 NOTE — Progress Notes (Signed)
Patient coughed out approximately 100 cc's of bright red blood since extubation, including several quarter sized blood clots. Patient also having some episodes of desaturations to 88% on 6L nasal cannula, RR 30's. Lung sounds wet, HR 100-110's. Dr. Arsenio Loader aware and CXR reviewed.  Rutherford Guys, PA and Tyson Alias, MD arrived on unit. Patient O2 sats back up to 100%, patient does not appear to be in distress at this time. Family at bedside. Will continue to monitor.

## 2015-11-16 NOTE — Progress Notes (Signed)
Patient ID: [name], female   DOB: October 02, 1930, 82 y.o.   MRN: [id number]    Advanced Heart Failure Rounding Note   Subjective:    Diuretics held yesterday with CVP 5-6.  CVP up to 10-11 this morning.  Co-ox 70%.  Awake on vent.  Failed SBT yesterday due to apnea.   Got 1 unit PRBCs on 2/21.   Echo: EF 35-40% with regional WMAs.  Moderate MR with MV prolapse.   Objective:   Weight Range:  Vital Signs:   Temp:  [96.9 F (36.1 C)-98.3 F (36.8 C)] 98.3 F (36.8 C) (02/22 0715) Pulse Rate:  [56-75] 70 (02/22 0752) Resp:  [12-25] 25 (02/22 0752) BP: (112-154)/(39-80) 126/49 mmHg (02/22 0752) SpO2:  [95 %-100 %] 99 % (02/22 0759) FiO2 (%):  [40 %] 40 % (02/22 0759) Weight:  [189 lb 6 oz (85.9 kg)] 189 lb 6 oz (85.9 kg) (02/22 0500) Last BM Date: 11/15/15  Weight change: Filed Weights   11/14/15 0243 11/15/15 0434 11/16/15 0500  Weight: 185 lb 13.6 oz (84.3 kg) 186 lb 15.2 oz (84.8 kg) 189 lb 6 oz (85.9 kg)    Intake/Output:   Intake/Output Summary (Last 24 hours) at 11/16/15 0816 Last data filed at 11/16/15 0700  Gross per 24 hour  Intake 1780.34 ml  Output   1730 ml  Net  50.34 ml    Physical Exam: Intubated.  CVP 10-11 General: Intubated.  HEENT: normal Neck: supple. JVP 8-9  Carotids 2+ bilat; no bruits. No lymphadenopathy or thryomegaly appreciated. Cor: PMI nondisplaced. Regular rate & rhythm. No rubs, gallops or murmurs. Lungs: clear Abdomen: soft, nontender, nondistended. No hepatosplenomegaly. No bruits or masses. Good bowel sounds. Extremities: no cyanosis, clubbing, rash, R and LLE edema.  Neuro: Opens eyes and follows directions. Moves all 4 extremities w/o difficulty.   Telemetry: SR PVCs.    Labs: Basic Metabolic Panel:  Recent Labs Lab 11/10/15 0415 11/11/15 0435  11/12/15 0340 11/13/15 0418 11/14/15 0400 11/14/15 1842 11/15/15 0435 11/16/15 0446  NA 140 134*  < > 134* 135 134* 132* 136 137  K 4.3 2.5*  < > 3.6 3.3* 3.1*  3.3* 3.6 3.0*  CL 98* 94*  < > 94* 91* 91* 90* 92* 96*  CO2 28 27  < > 26 29 29 29 30 30   GLUCOSE 175* 157*  < > 177* 159* 161* 236* 181* 146*  BUN 42* 45*  < > 59* 71* 82* 87* 93* 99*  CREATININE 1.18* 1.09*  < > 1.23* 1.16* 1.09* 1.07* 1.06* 0.93  CALCIUM 8.2* 7.7*  < > 8.0* 8.2* 8.3* 8.1* 8.2* 8.5*  MG 1.6* 1.8  --  2.3  --   --  2.0 2.2 2.4  PHOS 4.5 3.9  --  4.2  --   --   --  4.7* 4.3  < > = values in this interval not displayed.  Liver Function Tests: No results for input(s): AST, ALT, ALKPHOS, BILITOT, PROT, ALBUMIN in the last 168 hours. No results for input(s): LIPASE, AMYLASE in the last 168 hours.  Recent Labs Lab 11/10/15 0414  AMMONIA 29    CBC:  Recent Labs Lab 11/13/15 0418 11/14/15 0400 11/15/15 0435 11/15/15 1645 11/16/15 0446  WBC 5.7 4.9 4.2 6.5 5.6  NEUTROABS  --   --   --   --  3.8  HGB 7.4* 7.2* 6.6* 8.3* 8.1*  HCT 24.6* 23.2* 21.8* 26.4* 26.2*  MCV 86.9 86.9 87.9 89.5 87.6  PLT  126* 116* 118* 115* 121*    Cardiac Enzymes: No results for input(s): CKTOTAL, CKMB, CKMBINDEX, TROPONINI in the last 168 hours.  BNP: BNP (last 3 results)  Recent Labs  09/02/15 0715 11/08/15 0220  BNP 998.9* 2328.7*    ProBNP (last 3 results) No results for input(s): PROBNP in the last 8760 hours.    Other results:  Imaging: Dg Chest Port 1 View  11/16/2015  CLINICAL DATA:  Hypoxia EXAM: PORTABLE CHEST 1 VIEW COMPARISON:  November 15, 2015 FINDINGS: Endotracheal tube tip is 1.3 cm above the carina. Nasogastric tube tip and side port are in the stomach. Central catheter tip is in the superior vena cava. No pneumothorax. There is bibasilar atelectatic change with small pleural effusions bilaterally, stable. Heart is enlarged. There is mild pulmonary venous hypertension. There is atherosclerotic calcification aorta. Patient is status post coronary artery bypass grafting. No adenopathy evident. IMPRESSION: Tube and catheter positions as described without  pneumothorax. Evidence of a degree of congestive heart failure, unchanged. No new opacity. No change in cardiac silhouette. Electronically Signed   By: Bretta Bang III M.D.   On: 11/16/2015 07:16   Dg Chest Port 1 View  11/15/2015  CLINICAL DATA:  Intubation. EXAM: PORTABLE CHEST 1 VIEW COMPARISON:  11/14/2015. FINDINGS: Endotracheal tube tip 7 mm again noted 7 mm above the carina. Proximal repositioning should be considered. Left IJ line and NG tube in stable position. Prior CABG. Cardiomegaly pulmonary vascular prominence and interstitial prominence with bilateral effusions. Findings consistent congestive heart failure. Findings have improved slightly from prior exam. No pneumothorax. IMPRESSION: 1. Endotracheal tube tip again 7 mm above the carina. Proximal repositioning should again be considered. 2. Prior CABG. Cardiomegaly with pulmonary vascular congestion, interstitial prominence and bilateral pleural effusions consistent congestive heart failure. Slight interim improvement. These results will be called to the ordering clinician or representative by the Radiologist Assistant, and communication documented in the PACS or zVision Dashboard. Electronically Signed   By: Maisie Fus  Register   On: 11/15/2015 07:14     Medications:     Scheduled Medications: . sodium chloride   Intravenous Once  . albuterol  2.5 mg Nebulization BID  . antiseptic oral rinse  7 mL Mouth Rinse 10 times per day  . aspirin  81 mg Per Tube Daily  . atorvastatin  40 mg Oral q1800  . carvedilol  3.125 mg Oral BID WC  . chlorhexidine gluconate  15 mL Mouth Rinse BID  . feeding supplement (VITAL HIGH PROTEIN)  1,000 mL Per Tube Q24H  . furosemide  40 mg Intravenous Once  . insulin aspart  0-9 Units Subcutaneous Q4H  . pantoprazole sodium  40 mg Per Tube Q1200  . polyethylene glycol  17 g Oral Daily  . potassium chloride  10 mEq Intravenous Q1 Hr x 4  . primidone  50 mg Oral QHS  . senna-docusate  2 tablet Oral BID    . sodium chloride flush  10-40 mL Intracatheter Q12H  . sodium chloride flush  3 mL Intravenous Q12H  . spironolactone  25 mg Per Tube Daily    Infusions: . sodium chloride 10 mL/hr at 11/15/15 0800  . sodium chloride 10 mL/hr at 11/14/15 2150  . dexmedetomidine Stopped (11/16/15 0747)    PRN Medications: sodium chloride, hydrALAZINE, ipratropium-albuterol, metoprolol, ondansetron **OR** ondansetron (ZOFRAN) IV, sodium chloride flush   Assessment/Plan    1. Hypercarbic Respiratory Failure: Intubated 2/14. ?Baseline OSA. Also concern for LLL PNA. Unable to wean vent yesterday due  to apnea, very weak.  2. Acute on chronic systolic CHF: Ischemic cardiomyopathy. Echo with stable EF compared to prior, 35-40%. Yesterday diuretics held with low CVP. Co-ox good at 70%.   - CVP 10-11 today, will give Lasix 40 mg IV x 1 and replete K.  - Coreg 3.125 bid, hold off on ACEI for now with elevated BUN.  - Can continue spironolactone. 3. CAD S/P CABG: CABG in June 2000, with the LIMA to LAD, left radial artery to the OM, SVG to the PDA and SVG to the diagonal. LHC 2006 with PCI to SVG to PDA. Myoview 2013 which showed no ischemia or infarction.  EF stable on echo this admission.  - On aspirin. Cut back to 81 mg daily.  - Continue home statin.  4. Anemia: Hgb 7.2>6.6>8.1 after she got 1 unit PRBCs. FOBT negative, has history of chronic anemia followed by hematology.  5. Hypokalemia: Supplement K.  6. H/o CVA 7. ID: Recent UTI and PNA treated at [facility], she is on a course of antibiotics.  ?LLL PNA contributing to current respiratory failure.   Length of Stay: 9  Rosalena Mccorry Shirlee Latch  11/16/2015, 8:16 AM  Advanced Heart Failure Team Pager [phone] (M-F; 7a - 4p)  Please contact [facility] for night-coverage after hours (4p -7a ) and weekends on [email or web address]

## 2015-11-16 NOTE — Progress Notes (Addendum)
1200: Patient coughing up small amounts of bright red blood post extubation, approximately 20 cc's. Marchelle Gearing, MD aware. Orders given for additional  IV lasix now. Will continue to monitor.  1230: Patient still coughing another 30 cc's of thick, bright red blood + quarter sized clot. Dr. Marchelle Gearing at bedside. CVP 11, patient has has 850 urine output in last four hours. Orders given for another  IV lasix and CXR.

## 2015-11-17 ENCOUNTER — Inpatient Hospital Stay (HOSPITAL_COMMUNITY): Payer: Medicare Other

## 2015-11-17 DIAGNOSIS — J9600 Acute respiratory failure, unspecified whether with hypoxia or hypercapnia: Secondary | ICD-10-CM

## 2015-11-17 DIAGNOSIS — R042 Hemoptysis: Secondary | ICD-10-CM

## 2015-11-17 DIAGNOSIS — Z515 Encounter for palliative care: Secondary | ICD-10-CM

## 2015-11-17 DIAGNOSIS — Z7189 Other specified counseling: Secondary | ICD-10-CM

## 2015-11-17 DIAGNOSIS — J939 Pneumothorax, unspecified: Secondary | ICD-10-CM

## 2015-11-17 LAB — URINALYSIS W MICROSCOPIC (NOT AT ARMC)
Bilirubin Urine: NEGATIVE
Glucose, UA: NEGATIVE mg/dL
Ketones, ur: NEGATIVE mg/dL
Nitrite: NEGATIVE
PH: 5 (ref 5.0–8.0)
Protein, ur: 30 mg/dL — AB
SPECIFIC GRAVITY, URINE: 1.012 (ref 1.005–1.030)

## 2015-11-17 LAB — CBC WITH DIFFERENTIAL/PLATELET
BASOS ABS: 0 10*3/uL (ref 0.0–0.1)
BASOS PCT: 0 %
Eosinophils Absolute: 0 10*3/uL (ref 0.0–0.7)
Eosinophils Relative: 0 %
HEMATOCRIT: 27.8 % — AB (ref 36.0–46.0)
HEMOGLOBIN: 8.4 g/dL — AB (ref 12.0–15.0)
Lymphocytes Relative: 7 %
Lymphs Abs: 0.7 10*3/uL (ref 0.7–4.0)
MCH: 26.8 pg (ref 26.0–34.0)
MCHC: 30.2 g/dL (ref 30.0–36.0)
MCV: 88.5 fL (ref 78.0–100.0)
MONOS PCT: 11 %
Monocytes Absolute: 1.2 10*3/uL — ABNORMAL HIGH (ref 0.1–1.0)
NEUTROS ABS: 8.4 10*3/uL — AB (ref 1.7–7.7)
NEUTROS PCT: 82 %
Platelets: 147 10*3/uL — ABNORMAL LOW (ref 150–400)
RBC: 3.14 MIL/uL — ABNORMAL LOW (ref 3.87–5.11)
RDW: 19.6 % — ABNORMAL HIGH (ref 11.5–15.5)
WBC: 10.3 10*3/uL (ref 4.0–10.5)

## 2015-11-17 LAB — GLUCOSE, CAPILLARY
GLUCOSE-CAPILLARY: 100 mg/dL — AB (ref 65–99)
GLUCOSE-CAPILLARY: 120 mg/dL — AB (ref 65–99)
GLUCOSE-CAPILLARY: 136 mg/dL — AB (ref 65–99)
GLUCOSE-CAPILLARY: 132 mg/dL — AB (ref 65–99)
Glucose-Capillary: 125 mg/dL — ABNORMAL HIGH (ref 65–99)
Glucose-Capillary: 150 mg/dL — ABNORMAL HIGH (ref 65–99)

## 2015-11-17 LAB — PHOSPHORUS: PHOSPHORUS: 5.1 mg/dL — AB (ref 2.5–4.6)

## 2015-11-17 LAB — BASIC METABOLIC PANEL
ANION GAP: 14 (ref 5–15)
Anion gap: 14 (ref 5–15)
BUN: 100 mg/dL — ABNORMAL HIGH (ref 6–20)
BUN: 101 mg/dL — ABNORMAL HIGH (ref 6–20)
CALCIUM: 8.6 mg/dL — AB (ref 8.9–10.3)
CHLORIDE: 99 mmol/L — AB (ref 101–111)
CO2: 29 mmol/L (ref 22–32)
CO2: 30 mmol/L (ref 22–32)
CREATININE: 0.95 mg/dL (ref 0.44–1.00)
Calcium: 8.6 mg/dL — ABNORMAL LOW (ref 8.9–10.3)
Chloride: 100 mmol/L — ABNORMAL LOW (ref 101–111)
Creatinine, Ser: 1 mg/dL (ref 0.44–1.00)
GFR calc non Af Amer: 54 mL/min — ABNORMAL LOW (ref >60)
GFR calc non Af Amer: 51 mL/min — ABNORMAL LOW (ref >60)
GFR, EST AFRICAN AMERICAN: 59 mL/min — AB (ref >60)
Glucose, Bld: 164 mg/dL — ABNORMAL HIGH (ref 65–99)
Glucose, Bld: 135 mg/dL — ABNORMAL HIGH (ref 65–99)
Potassium: 4 mmol/L (ref 3.5–5.1)
Potassium: 3.8 mmol/L (ref 3.5–5.1)
SODIUM: 143 mmol/L (ref 135–145)
Sodium: 143 mmol/L (ref 135–145)

## 2015-11-17 LAB — CARBOXYHEMOGLOBIN
CARBOXYHEMOGLOBIN: 1.8 % — AB (ref 0.5–1.5)
Methemoglobin: 0.6 % (ref 0.0–1.5)
O2 SAT: 62.3 %
Total hemoglobin: 8.5 g/dL — ABNORMAL LOW (ref 12.0–16.0)

## 2015-11-17 LAB — PROTIME-INR
INR: 1.18 (ref 0.00–1.49)
PROTHROMBIN TIME: 15.1 s (ref 11.6–15.2)

## 2015-11-17 LAB — POCT I-STAT 3, ART BLOOD GAS (G3+)
Acid-Base Excess: 7 mmol/L — ABNORMAL HIGH (ref 0.0–2.0)
Bicarbonate: 32.1 mEq/L — ABNORMAL HIGH (ref 20.0–24.0)
O2 Saturation: 100 %
PCO2 ART: 46.8 mmHg — AB (ref 35.0–45.0)
PH ART: 7.445 (ref 7.350–7.450)
PO2 ART: 194 mmHg — AB (ref 80.0–100.0)
Patient temperature: 98.7
TCO2: 34 mmol/L (ref 0–100)

## 2015-11-17 LAB — APTT: aPTT: 35 seconds (ref 24–37)

## 2015-11-17 LAB — MAGNESIUM: MAGNESIUM: 2.4 mg/dL (ref 1.7–2.4)

## 2015-11-17 LAB — TROPONIN I
TROPONIN I: 1.74 ng/mL — AB (ref <0.031)
TROPONIN I: 2 ng/mL — AB (ref <0.031)
Troponin I: 1.66 ng/mL (ref <0.031)

## 2015-11-17 LAB — FIBRINOGEN: Fibrinogen: 538 mg/dL — ABNORMAL HIGH (ref 204–475)

## 2015-11-17 LAB — HEMOGLOBIN AND HEMATOCRIT, BLOOD
HEMATOCRIT: 28.6 % — AB (ref 36.0–46.0)
Hemoglobin: 8.5 g/dL — ABNORMAL LOW (ref 12.0–15.0)

## 2015-11-17 MED ORDER — CHLORHEXIDINE GLUCONATE 0.12% ORAL RINSE (MEDLINE KIT)
15.0000 mL | Freq: Two times a day (BID) | OROMUCOSAL | Status: DC
  Administered 2015-11-17 – 2015-11-25 (×17): 15 mL via OROMUCOSAL

## 2015-11-17 MED ORDER — PROPOFOL 1000 MG/100ML IV EMUL
INTRAVENOUS | Status: AC
  Filled 2015-11-17: qty 100

## 2015-11-17 MED ORDER — CHLORHEXIDINE GLUCONATE 0.12% ORAL RINSE (MEDLINE KIT): 15.0000 mL | Freq: Two times a day (BID) | OROMUCOSAL | Status: DC

## 2015-11-17 MED ORDER — PROPOFOL 1000 MG/100ML IV EMUL
5.0000 ug/kg/min | INTRAVENOUS | Status: DC
  Administered 2015-11-17: 10 ug/kg/min via INTRAVENOUS
  Administered 2015-11-18 (×3): 50 ug/kg/min via INTRAVENOUS
  Administered 2015-11-18: 30 ug/kg/min via INTRAVENOUS
  Administered 2015-11-18 – 2015-11-19 (×3): 50 ug/kg/min via INTRAVENOUS
  Administered 2015-11-19: 20 ug/kg/min via INTRAVENOUS
  Administered 2015-11-19: 40 ug/kg/min via INTRAVENOUS
  Administered 2015-11-20: 22 ug/kg/min via INTRAVENOUS
  Administered 2015-11-20: 23 ug/kg/min via INTRAVENOUS
  Filled 2015-11-17 (×12): qty 100

## 2015-11-17 MED ORDER — ANTISEPTIC ORAL RINSE SOLUTION (CORINZ): 7.0000 mL | OROMUCOSAL | Status: DC

## 2015-11-17 MED ORDER — ANTISEPTIC ORAL RINSE SOLUTION (CORINZ)
7.0000 mL | OROMUCOSAL | Status: DC
  Administered 2015-11-17 – 2015-11-25 (×65): 7 mL via OROMUCOSAL

## 2015-11-17 MED ORDER — ETOMIDATE 2 MG/ML IV SOLN
25.0000 mg | Freq: Once | INTRAVENOUS | Status: AC
  Administered 2015-11-16: 25 mg via INTRAVENOUS

## 2015-11-17 MED FILL — Medication: Qty: 1 | Status: AC

## 2015-11-17 NOTE — Consult Note (Signed)
Consultation Note Date: 11/17/2015   Patient Name: [name]  DOB: [birth year]  MRN: [id number]  Age / Sex: 82 y.o., female  PCP: Sheliah Hatch, MD Referring Physician: Kalman Shan, MD  Reason for Consultation: Establishing goals of care and Psychosocial/spiritual support   Clinical Assessment/Narrative:  82 y.o. female with history of diabetes mellitus type 2, previous stroke, hypertension, chronic anemia and chronic kidney disease was brought to the ER after patient was found to be in altered mental status. Patient had recent admission to the hospital [facility]  last week for abdominal pain and at that time patient was diagnosed with UTI and discharged home after being treated on Levaquin.   This NP Lorinda Creed reviewed medical records, received report from team, assessed the patient and then meet at the patient's bedside along with daughter/HPOA [name], niece [name] from [place]/tlephone  to discuss diagnosis, prognosis, GOC, EOL wishes disposition and options.  Specifically our discussion today involved current medical situation, overall poor prognosis, treatment options and probable outcomes, limitations of medical interventions as a body begins to fail to thrive and concept of mortality.  We discussed cultural impact as it relates specifically to this patient and her family.  Her daughter feels heavy burden being the main decision maker.  A  discussion was had today regarding advanced directives.  Concepts specific to code status, artifical feeding and hydration, continued IV antibiotics and rehospitalization was had.  The difference between a aggressive medical intervention path  and a palliative comfort care path for this patient at this time was had.  Values and goals of care important to patient and family were attempted to be elicited.  Natural trajectory and  expectations at EOL were discussed.  Questions and concerns addressed.  Family encouraged to call with questions or concerns.  PMT will continue to support holistically.    Primary Decision Maker: [name]/daughter  HCPOA: yes    SUMMARY OF RECOMMENDATIONS  -continue current medical interventions to treat the treatable, family remains hopeful for improvement  -daughter requests more time before any decision regarding trach is made, "I need to pray" and "I need to involve my family" - re- meet with this NP on Sunday at 1200 to clarify long term decsions  Code Status/Advance Care Planning: Full code    Code Status Orders        Start     Ordered   11/08/15 0124  Full code   Continuous     02 /14/17 0125    Code Status History    Date Active Date Inactive Code Status Order ID Comments User Context   09/01/2015  7:44 PM 09/05/2015  9:52 PM Full Code [id number]  Yevonne Pax, MD Inpatient   09/19/2011 10:19 PM 09/30/2011  9:00 PM Full Code [id number]  Valaria Good, RN Inpatient   09/12/2011  9:10 PM 09/16/2011  5:26 PM Full Code [id number]  Dorothea Ogle, MD ED       Palliative Prophylaxis:   Aspiration, Bowel Regimen, Frequent Pain Assessment and Oral Care  Psycho-social/Spiritual:  Support System: Fair Desire for further Chaplaincy support:no Additional Recommendations: Education on Hospice and Grief/Bereavement Support  Prognosis: Pending life prolonging decisions   Chief Complaint/ Primary Diagnoses: Present on Admission:  . Acute encephalopathy . Acute respiratory failure with hypercapnia (HCC) . Type 2 diabetes mellitus with vascular disease (HCC) . Acute on chronic diastolic CHF (congestive heart failure), NYHA class 1 (HCC) . Essential hypertension  I have reviewed  the medical record, interviewed the patient and family, and examined the patient. The following aspects are pertinent.  Past Medical History  Diagnosis Date  . Diabetes mellitus   . Hypertension    . Stroke [facility]) Right Side Weakness  . CHF (congestive heart failure) (HCC)     2D ECHO, 09/13/2011 - EF 40-45%, normal  . 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 Pain in limb     BILATERAL EXTREMITY VENOUS DUPLEX, 01/08/2011 - no evidence of deep vein or superficial thrombosis or Baker's cyst   Social History   Social History  . Marital Status: Married    Spouse Name: N/A  . Number of Children: N/A  . Years of Education: N/A   Social History Main Topics  . Smoking status: Never Smoker   . Smokeless tobacco: Never Used  . Alcohol Use: No  . Drug Use: No  . Sexual Activity: No   Other Topics Concern  . None   Social History Narrative   Family History  Problem Relation Age of Onset  . Cancer Mother     Lung  . Heart disease Brother   . Hypertension Brother   . Heart disease Brother   . Hypertension Brother    Scheduled Meds: . albuterol  2.5 mg Nebulization BID  . antiseptic oral rinse  7 mL Mouth Rinse 10 times per day  . atorvastatin  40 mg Oral q1800  . carvedilol  3.125 mg Oral BID WC  . chlorhexidine gluconate  15 mL Mouth Rinse BID  . ferrous sulfate  300 mg Per Tube Daily  . insulin aspart  0-9 Units Subcutaneous Q4H  . pantoprazole (PROTONIX) IV  40 mg Intravenous Q24H  . polyethylene glycol  17 g Oral Daily  . primidone  50 mg Oral QHS  . senna-docusate  2 tablet Oral BID  . sodium chloride flush  10-40 mL Intracatheter Q12H  . sodium chloride flush  3 mL Intravenous Q12H  . spironolactone  25 mg Per Tube Daily   Continuous Infusions: . sodium chloride 10 mL/hr at 11/17/15 0700  . sodium chloride 10 mL/hr at 11/14/15 2150   PRN Meds:.sodium chloride, fentaNYL (SUBLIMAZE) injection, hydrALAZINE, ipratropium-albuterol, metoprolol, midazolam, ondansetron **OR** ondansetron (ZOFRAN) IV, sodium chloride  flush Medications Prior to Admission:  Prior to Admission medications   Medication Sig Start Date End Date Taking? Authorizing Provider  albuterol (PROVENTIL) (2.5 MG/3ML) 0.083% nebulizer solution Take 2.5 mg by nebulization 2 (two) times daily.  10/21/15 10/20/16 Yes Historical Provider, MD  albuterol-ipratropium (COMBIVENT) 18-103 MCG/ACT inhaler Inhale 1 puff into the lungs every 4 (four) hours as needed.  10/21/15 10/20/16 Yes Historical Provider, MD  allopurinol (ZYLOPRIM) 100 MG tablet Take 100 mg by mouth daily. 03/08/14  Yes Historical Provider, MD  atorvastatin (LIPITOR) 40 MG tablet Take 1 tablet (40 mg total) by mouth daily. Patient taking differently: Take 40 mg by mouth daily at 6 PM.  10/26/15  Yes Sheliah Hatch, MD  clopidogrel (PLAVIX) 75 MG tablet Take 1 tablet (75 mg total) by mouth daily. 09/12/15  Yes Dwana Melena, PA-C  colchicine 0.6 MG tablet Take 0.6 mg by mouth daily.    Yes Historical Provider, MD  CRANBERRY CONCENTRATE PO Take 1 tablet by mouth 2 (two) times daily.   Yes Historical Provider, MD  folic acid (FOLVITE) 1 MG  tablet TAKE ONE TABLET BY MOUTH ONCE DAILY 09/27/15  Yes Lennette Bihari, MD  isosorbide mononitrate (IMDUR) 60 MG 24 hr tablet Take 1 tablet (60 mg total) by mouth daily. NEED OV. 08/16/15  Yes Lennette Bihari, MD  JANUVIA 50 MG tablet Take 50 mg by mouth daily with breakfast.  04/09/14  Yes Historical Provider, MD  metoprolol (LOPRESSOR) 50 MG tablet Take 1 tablet (50 mg total) by mouth 2 (two) times daily. 08/16/15  Yes Lennette Bihari, MD  nitroGLYCERIN (NITROSTAT) 0.4 MG SL tablet Place 1 tablet (0.4 mg total) under the tongue as needed. 08/16/15  Yes Lennette Bihari, MD  omega-3 acid ethyl esters (LOVAZA) 1 G capsule TAKE TWO CAPSULES BY MOUTH TWICE DAILY Patient taking differently: Take 1 g by mouth 2 (two) times daily.  08/16/15  Yes Lennette Bihari, MD  pantoprazole (PROTONIX) 40 MG tablet Take 1 tablet (40 mg total) by mouth daily. Make appt for  refills. 08/16/15  Yes Lennette Bihari, MD  polysaccharide iron (NIFEREX) 150 MG CAPS capsule Take 150 mg by mouth 2 (two) times daily.    Yes Historical Provider, MD  primidone (MYSOLINE) 50 MG tablet Take 50 mg by mouth at bedtime.    Yes Historical Provider, MD  senna (SENOKOT) 8.6 MG tablet Take 1 tablet by mouth 2 (two) times daily.    Yes Historical Provider, MD  torsemide (DEMADEX) 20 MG tablet Take 2 tablets (40 mg total) by mouth daily. Patient taking differently: Take 20 mg by mouth 2 (two) times daily.  11/01/15  Yes Abelino Derrick, PA-C   Allergies  Allergen Reactions  . Penicillins Anaphylaxis, Itching and Swelling    Has patient had a PCN reaction causing immediate rash, facial/tongue/throat swelling, SOB or lightheadedness with hypotension: Yes Has patient had a PCN reaction causing severe rash involving mucus membranes or skin necrosis: No  Has patient had a PCN reaction that required hospitalization: Unknown Has patient had a PCN reaction occurring within the last 10 years: No  If all of the above answers are "NO", then may proceed with Cephalosporin use.     Review of Systems  Unable to perform ROS   Physical Exam  Constitutional: She appears ill. She is intubated.  Cardiovascular: Normal rate and regular rhythm.   Respiratory: She is intubated.  Skin: Skin is warm and dry.    Vital Signs: BP 110/33 mmHg  Pulse 71  Temp(Src) 98.3 F (36.8 C) (Oral)  Resp 16  Ht 5\' 2"  (1.575 m)  Wt 85.9 kg (189 lb 6 oz)  BMI 34.63 kg/m2  SpO2 100%  SpO2: SpO2: 100 % O2 Device:SpO2: 100 % O2 Flow Rate: .O2 Flow Rate (L/min): 5 L/min  IO: Intake/output summary:  Intake/Output Summary (Last 24 hours) at 11/17/15 1448 Last data filed at 11/17/15 1100  Gross per 24 hour  Intake    400 ml  Output   1640 ml  Net  -1240 ml    LBM: Last BM Date: 11/17/15 Baseline Weight: Weight: 89.9 kg (198 lb 3.1 oz) Most recent weight: Weight: 85.9 kg (189 lb 6 oz)      Palliative  Assessment/Data:  Flowsheet Rows        Most Recent Value   Intake Tab    Referral Department  Critical care   Unit at Time of Referral  ICU   Palliative Care Primary Diagnosis  Cardiac   Date Notified  11/17/15   Palliative Care Type  New  Palliative care   Reason for referral  Clarify Goals of Care   Date of Admission  11/07/15   Date first seen by Palliative Care  11/17/15   # of days Palliative referral response time  0 Day(s)   # of days IP prior to Palliative referral  10   Clinical Assessment    Psychosocial & Spiritual Assessment    Palliative Care Outcomes       Additional Data Reviewed:  CBC:    Component Value Date/Time   WBC 10.3 11/17/2015 0345   WBC 6.1 09/27/2015 1523   HGB 8.4* 11/17/2015 0345   HGB 9.8* 09/27/2015 1523   HCT 27.8* 11/17/2015 0345   HCT 32.5* 09/27/2015 1523   HCT 32.5* 09/02/2015 0715   PLT 147* 11/17/2015 0345   PLT 117* 09/27/2015 1523   MCV 88.5 11/17/2015 0345   MCV 90.4 09/27/2015 1523   NEUTROABS 8.4* 11/17/2015 0345   NEUTROABS 4.2 09/27/2015 1523   LYMPHSABS 0.7 11/17/2015 0345   LYMPHSABS 0.9 09/27/2015 1523   MONOABS 1.2* 11/17/2015 0345   MONOABS 0.6 09/27/2015 1523   EOSABS 0.0 11/17/2015 0345   EOSABS 0.3 09/27/2015 1523   BASOSABS 0.0 11/17/2015 0345   BASOSABS 0.1 09/27/2015 1523   Comprehensive Metabolic Panel:    Component Value Date/Time   NA 143 11/17/2015 0345   NA 136 09/27/2015 1523   NA 138 02/19/2015   K 3.8 11/17/2015 0345   K 4.9 09/27/2015 1523   CL 99* 11/17/2015 0345   CO2 30 11/17/2015 0345   CO2 30* 09/27/2015 1523   BUN 101* 11/17/2015 0345   BUN 26.3* 09/27/2015 1523   BUN 26* 02/19/2015   CREATININE 1.00 11/17/2015 0345   CREATININE 0.9 09/27/2015 1523   CREATININE 0.8 02/19/2015   CREATININE 0.87 04/12/2014 1409   GLUCOSE 135* 11/17/2015 0345   GLUCOSE 134 09/27/2015 1523   CALCIUM 8.6* 11/17/2015 0345   CALCIUM 8.8 09/27/2015 1523   AST 64* 11/08/2015 0220   AST 72* 09/27/2015  1523   ALT 29 11/08/2015 0220   ALT 32 09/27/2015 1523   ALKPHOS 131* 11/08/2015 0220   ALKPHOS 163* 09/27/2015 1523   BILITOT 0.9 11/08/2015 0220   BILITOT 0.53 09/27/2015 1523   PROT 6.2* 11/08/2015 0220   PROT 6.9 09/27/2015 1523   ALBUMIN 3.1* 11/08/2015 0220   ALBUMIN 2.9* 09/27/2015 1523   Discussed with Dr Marchelle Gearing  Time In: 1400 Time Out: 1515 Time Total: 75 min Greater than 50%  of this time was spent counseling and coordinating care related to the above assessment and plan.  Signed by: Lorinda Creed, NP  Canary Brim, NP  11/17/2015, 2:48 PM  Please contact Palliative Medicine Team phone at [phone] for questions and concerns.

## 2015-11-17 NOTE — Procedures (Signed)
[name] Chest Tube Insertion Procedure Note  Indications:  Clinically significant [name]  Pre-operative Diagnosis: Left [name]  Post-operative Diagnosis: Left [name]  Procedure Details  Informed consent was obtained for the procedure, including sedation.  Risks of lung perforation, hemorrhage, arrhythmia, and adverse drug reaction were discussed.   Time out was performed  After sterile skin prep and draping, the subcutaneous tissue, 5th rib space, and pleural space were locally anesthetized with 1% lidocaine. Using the Seldinger technique, a 14 French tube was placed in the left lateral 5th rib space and subsequently attached to Pleura-vac drainage system to 20 cmH20 suction.   Findings: 0 ml of fluid obtained  Estimated Blood Loss:  Minimal         Specimens:  None              Complications:  None; patient tolerated the procedure well.         Disposition: ICU - intubated and critically ill.         Condition: stable  Joneen Roach, AGACNP-BC [facility]/Critical Care Pager [phone] or [phone]  11/17/2015 3:18 AM

## 2015-11-17 NOTE — Progress Notes (Signed)
Nutrition Follow-up  DOCUMENTATION CODES:   Obesity unspecified  INTERVENTION:   Recommend resume Vital High Protein @ 50 ml/hr Provides: 1200 kcal, 105 grams protein, and 1003 ml H2O.   NUTRITION DIAGNOSIS:   Inadequate oral intake related to inability to eat as evidenced by NPO status. Ongoing.   GOAL:   Provide needs based on ASPEN/SCCM guidelines Not met.   MONITOR:   Vent status, I & O's, TF tolerance  ASSESSMENT:   [place] female with hx of DM, stroke, HTN, CHF (EF 50%) was admitted with encephalopathy. Pt recently hospitalized at [facility] for abd pain and dx with UTI. Pt with respiratory failure in setting of CHF, effusions and LLL PNA who failed BiPAP, intubated 2/14.  2/22 extubated, pt had large hemoptysis and re-intubated, s/p left chest tube for pneumothorax Pt very deconditioned, palliative care meeting pending. Paged MD to start TF but no call back.   Patient is currently intubated on ventilator support MV: 6.2 L/min Temp (24hrs), Avg:98.5 F (36.9 C), Min:98.1 F (36.7 C), Max:98.7 F (37.1 C) Medications reviewed and include: ferrous sulfate, miralax Labs reviewed: phosphorus elevated 5.1   Diet Order:  Diet NPO time specified  Skin:  Wound (see comment) (stage II sacrum)  Last BM:  2/23  Height:   Ht Readings from Last 1 Encounters:  11/08/15 '5\' 2"'$  (1.575 m)    Weight:   Wt Readings from Last 1 Encounters:  11/16/15 189 lb 6 oz (85.9 kg)    Ideal Body Weight:  50 kg  BMI:  Body mass index is 34.63 kg/(m^2).  Estimated Nutritional Needs:   Kcal:  [phone]  Protein:  > 100 grams  Fluid:  > 1.5 L/day  EDUCATION NEEDS:   No education needs identified at this time  [place], [place], [place] Pager [phone] After Hours Pager

## 2015-11-17 NOTE — Progress Notes (Signed)
Sputum sample obtained and sent to lab by RT. 

## 2015-11-17 NOTE — Progress Notes (Signed)
Patient ID: [name], female   DOB: [birth year], 82 y.o.   MRN: [id number]    Advanced Heart Failure Rounding Note   Subjective:    Yesterday diuresed with IV lasix. Extubated but had acute hypoxic respiratory failure requiring emergent re-intubated.  Bronchoscopy last night to remove obstructive mucus plug/clot, developed PTX and now has chest tube on right.   CVP 8 today, co-ox 62%.   Got 1 unit PRBCs on 2/21.   Echo: EF 35-40% with regional WMAs.  Moderate MR with MV prolapse.   Objective:   Weight Range:  Vital Signs:   Temp:  [98.4 F (36.9 C)-98.7 F (37.1 C)] 98.5 F (36.9 C) (02/23 0400) Pulse Rate:  [25-118] 86 (02/23 0700) Resp:  [8-35] 18 (02/23 0700) BP: (84-186)/(43-143) 135/98 mmHg (02/23 0700) SpO2:  [83 %-100 %] 100 % (02/23 0749) FiO2 (%):  [40 %-100 %] 60 % (02/23 0749) Last BM Date: 11/16/15  Weight change: Filed Weights   11/14/15 0243 11/15/15 0434 11/16/15 0500  Weight: 185 lb 13.6 oz (84.3 kg) 186 lb 15.2 oz (84.8 kg) 189 lb 6 oz (85.9 kg)    Intake/Output:   Intake/Output Summary (Last 24 hours) at 11/17/15 0753 Last data filed at 11/17/15 0703  Gross per 24 hour  Intake 331.65 ml  Output   2565 ml  Net -2233.35 ml    Physical Exam: Intubated.  CVP 7-8  General: Intubated.  HEENT: normal Neck: supple. JVP 8. Carotids 2+ bilat; no bruits. No lymphadenopathy or thryomegaly appreciated. Cor: PMI nondisplaced. Regular rate & rhythm. No rubs, gallops or murmurs. Lungs: clear Abdomen: soft, nontender, nondistended. No hepatosplenomegaly. No bruits or masses. Good bowel sounds. Extremities: no cyanosis, clubbing, rash, R and LLE edema.  Neuro: Sedated/intubated. Moves all 4 extremities w/o difficulty.   Telemetry: SR PVCs.    Labs: Basic Metabolic Panel:  Recent Labs Lab 11/11/15 0435  11/12/15 0340  11/14/15 1842 11/15/15 0435 11/16/15 0446 11/17/15 0035 11/17/15 0345  NA 134*  < > 134*  < > 132* 136 137 143 143    K 2.5*  < > 3.6  < > 3.3* 3.6 3.0* 4.0 3.8  CL 94*  < > 94*  < > 90* 92* 96* 100* 99*  CO2 27  < > 26  < > GLUCOSE 157*  < > 177*  < > 236* 181* 146* 164* 135*  BUN 45*  < > 59*  < > 87* 93* 99* 100* 101*  CREATININE 1.09*  < > 1.23*  < > 1.07* 1.06* 0.93 0.95 1.00  CALCIUM 7.7*  < > 8.0*  < > 8.1* 8.2* 8.5* 8.6* 8.6*  MG 1.8  --  2.3  --  2.0 2.2 2.4  --  2.4  PHOS 3.9  --  4.2  --   --  4.7* 4.3  --  5.1*  < > = values in this interval not displayed.  Liver Function Tests: No results for input(s): AST, ALT, ALKPHOS, BILITOT, PROT, ALBUMIN in the last 168 hours. No results for input(s): LIPASE, AMYLASE in the last 168 hours. No results for input(s): AMMONIA in the last 168 hours.  CBC:  Recent Labs Lab 11/14/15 0400 11/15/15 0435 11/15/15 1645 11/16/15 0446 11/17/15 0035 11/17/15 0345  WBC 4.9 4.2 6.5 5.6  --  10.3  NEUTROABS  --   --   --  3.8  --  8.4*  HGB 7.2* 6.6* 8.3* 8.1* 8.5* 8.4*  HCT 23.2* 21.8* 26.4* 26.2* 28.6* 27.8*  MCV 86.9 87.9 89.5 87.6  --  88.5  PLT 116* 118* 115* 121*  --  147*    Cardiac Enzymes:  Recent Labs Lab 11/17/15 0345  TROPONINI 1.74*    BNP: BNP (last 3 results)  Recent Labs  09/02/15 0715 11/08/15 0220  BNP 998.9* 2328.7*    ProBNP (last 3 results) No results for input(s): PROBNP in the last 8760 hours.    Other results:  Imaging: Dg Chest Port 1 View  11/17/2015  CLINICAL DATA:  Status post left-sided chest tube placement. Assess for residual pneumothorax. Initial encounter. EXAM: PORTABLE CHEST 1 VIEW COMPARISON:  Chest radiograph performed earlier today at 12:12 a.m. FINDINGS: The patient's endotracheal tube is seen ending 2-3 cm above the carina. The enteric tube is noted ending overlying the antrum of the stomach. A left IJ line is noted ending about the mid to distal SVC. A left-sided chest tube is noted.  No residual pneumothorax is seen. The lungs are hypoexpanded. A small right pleural effusion is  noted. Right basilar and left perihilar airspace opacity raise concern for pneumonia. No pneumothorax is seen. The cardiomediastinal silhouette is mildly enlarged. The patient is status post median sternotomy, with evidence of prior CABG. No acute osseous abnormalities are seen. IMPRESSION: 1. Status post left-sided chest tube placement. No residual pneumothorax seen. 2. Endotracheal tube seen ending 2-3 cm above the carina. 3. Lungs hypoexpanded, with small right pleural effusion. Right basilar and left perihilar airspace opacities raise concern for pneumonia. 4. Mild cardiomegaly. Electronically Signed   By: Roanna Raider M.D.   On: 11/17/2015 03:46   Portable Chest Xray  11/17/2015  CLINICAL DATA:  Respiratory failure.  Re- intubated. EXAM: PORTABLE CHEST 1 VIEW COMPARISON:  Earlier this day at 1314 hour FINDINGS: Endotracheal tube is 4.6 cm from the carina. Enteric tube is in place, tip below the diaphragm. Left central line tip in the mid SVC. Small apical lateral left pneumothorax, approximately 10%. No mediastinal shift. Suggestion of pneumomediastinum or pneumopericardium outlining the inferior heart border. Lucency about the left heart border is well. Decreasing pleural effusions and bibasilar opacities. Worsening interstitial prominence on the right. Cardiomegaly again seen. IMPRESSION: 1. Endotracheal tube 4.6 cm from the carina. 2. New small left pneumothorax. Air outlining the inferior heart border, pneumomediastinum versus pneumopericardium. Abnormal lucency about the left heart border, secondary to extrapleural versus mediastinal air. 3. Improving bilateral pleural effusions. Increasing interstitial opacities in the right perihilar lung, may be asymmetric pulmonary edema. Critical Value/emergent results were called by telephone at the time of interpretation on 11/17/2015 at 1:18 am to Dr. Silvestre Gunner, who verbally acknowledged these results. Electronically Signed   By: Rubye Oaks M.D.   On:  11/17/2015 01:20   Dg Chest Port 1 View  11/16/2015  CLINICAL DATA:  Hemoptysis. EXAM: PORTABLE CHEST 1 VIEW COMPARISON:  11/16/2015. FINDINGS: The endotracheal and nasogastric tubes have been removed. Stable left jugular catheter. Stable enlarged cardiac silhouette and post CABG changes. Mildly increased bibasilar airspace opacity. Small bilateral pleural effusions. Stable prominence of the pulmonary vasculature and interstitial markings. Increased linear density in the left mid lung zone. Diffuse osteopenia. IMPRESSION: 1. Increased bibasilar pneumonia or atelectasis. 2. Small bilateral pleural effusions. 3. Otherwise, stable cardiomegaly and changes of congestive heart failure. 4. Increased atelectasis though the left mid lung zone. Electronically Signed   By: Beckie Salts M.D.   On: 11/16/2015 13:24   Dg Chest Port 1 View  11/16/2015  CLINICAL DATA:  Hypoxia EXAM: PORTABLE CHEST 1 VIEW COMPARISON:  November 15, 2015 FINDINGS: Endotracheal tube tip is 1.3 cm above the carina. Nasogastric tube tip and side port are in the stomach. Central catheter tip is in the superior vena cava. No pneumothorax. There is bibasilar atelectatic change with small pleural effusions bilaterally, stable. Heart is enlarged. There is mild pulmonary venous hypertension. There is atherosclerotic calcification aorta. Patient is status post coronary artery bypass grafting. No adenopathy evident. IMPRESSION: Tube and catheter positions as described without pneumothorax. Evidence of a degree of congestive heart failure, unchanged. No new opacity. No change in cardiac silhouette. Electronically Signed   By: Bretta Bang III M.D.   On: 11/16/2015 07:16   Dg Abd Portable 1v  11/17/2015  CLINICAL DATA:  Enteric tube placement.  Initial encounter. EXAM: PORTABLE ABDOMEN - 1 VIEW COMPARISON:  CT of the abdomen and pelvis performed 10/13/2015 FINDINGS: The patient's enteric tube is noted ending overlying the body of the stomach. The  stomach is relatively decompressed. There is diffuse distention of small and large bowel loops, concerning for mild ileus. No free intra-abdominal air is seen, though evaluation for free air is limited given the patient's habitus and on a single supine view. No acute osseous abnormalities are seen. An external pacing pad is noted. The patient is status post median sternotomy. IMPRESSION: 1. Enteric tube noted ending overlying the body of the stomach. 2. Diffuse distention of small and large bowel loops is concerning for mild ileus. Electronically Signed   By: Roanna Raider M.D.   On: 11/17/2015 01:03     Medications:     Scheduled Medications: . albuterol  2.5 mg Nebulization BID  . antiseptic oral rinse  7 mL Mouth Rinse 10 times per day  . atorvastatin  40 mg Oral q1800  . atropine      . carvedilol  3.125 mg Oral BID WC  . chlorhexidine gluconate  15 mL Mouth Rinse BID  . ferrous sulfate  300 mg Per Tube Daily  . insulin aspart  0-9 Units Subcutaneous Q4H  . pantoprazole (PROTONIX) IV  40 mg Intravenous Q24H  . polyethylene glycol  17 g Oral Daily  . primidone  50 mg Oral QHS  . propofol      . senna-docusate  2 tablet Oral BID  . sodium chloride flush  10-40 mL Intracatheter Q12H  . sodium chloride flush  3 mL Intravenous Q12H  . spironolactone  25 mg Per Tube Daily    Infusions: . sodium chloride 10 mL/hr at 11/16/15 0700  . sodium chloride 10 mL/hr at 11/14/15 2150    PRN Medications: sodium chloride, fentaNYL (SUBLIMAZE) injection, hydrALAZINE, ipratropium-albuterol, metoprolol, midazolam, ondansetron **OR** ondansetron (ZOFRAN) IV, sodium chloride flush   Assessment/Plan    1. Hypercarbic Respiratory Failure: Intubated 2/14. ?Baseline OSA. Also suspect PNA. Extubated yesterday but re-intubated earlier this morning with respiratory distress.  Developed mucus/clot plugging and had bronchoscopy, complicated by right PTX and now has chest tube.  2. Acute on chronic  systolic CHF: Ischemic cardiomyopathy. Echo with stable EF compared to prior, 35-40%. CVP 8. Co-ox 62%.   - Can hold off on Lasix for now, BUN very high and CVP ok.  - Coreg 3.125 bid, hold off on ACEI for now with elevated BUN.  - Can continue spironolactone. 3. CAD S/P CABG: CABG in June 2000, with the LIMA to LAD, left radial artery to the OM, SVG to the PDA and SVG to the diagonal. LHC  2006 with PCI to SVG to PDA. Myoview 2013 which showed no ischemia or infarction.  EF stable on echo this admission.  - On aspirin. Cut back to 81 mg daily.  - Continue home statin.  4. Anemia: Hgb 7.2>6.6>8.1>8.4 after she got 1 unit PRBCs. FOBT negative, has history of chronic anemia followed by hematology.  5. Hypokalemia: Supplement K as needed.  6. H/o CVA 7. ID: Recent UTI and PNA treated at [facility], she is on a course of antibiotics.  ?LLL PNA contributing to current respiratory failure.  8. L Pneumothorax- S/P L chest tube.   Length of Stay: 10  Amy Clegg NP-C  11/17/2015, 7:53 AM  Advanced Heart Failure Team Pager [phone] (M-F; 7a - 4p)  Please contact [facility] for night-coverage after hours (4p -7a ) and weekends on [email or web address]  Patient seen with NP, agree with the above note.  From a cardiac standpoint, she is stable with CVP 8 and good co-ox.  However, her pulmonary status is very poor.  She was unable to tolerate extubation, likely due to weakness/hypoventilation.  Suspect she has underlying PNA.  Now she has left PTX.  I think overall prognosis is poor, would consider palliative care involvement.   Marca Ancona 11/17/2015 9:55 AM

## 2015-11-17 NOTE — Procedures (Signed)
Medications Administered: 1. Versed  IV push 2. Fentanyl IV push  Description of Procedure: Previous endotracheal tube removed emergently to assist with bagging. Once the bronchoscope arrived patient was given additional Versed and fentanyl for sedation. A [employer]osh #3 blade with Glidescope was inserted into the patient's posterior pharynx with visualized blood that was suctioned easily. Excellent view of the vocal cords. An endotracheal tube 7.5 was inserted between the vocal cords with ease to 23 cm. Bilateral breath sounds were auscultated. Patient had excellent repeat capnographic color change. Bronchoscope was then utilized to reposition patient's endotracheal tube to 20 cm during procedure. Awaiting postprocedure stat portable chest x-ray and ABG.

## 2015-11-17 NOTE — Progress Notes (Signed)
PULMONARY / CRITICAL CARE MEDICINE   Name: [name] MRN: [id number] DOB: 18-Sep-1931    ADMISSION DATE:  11/07/2015 CONSULTATION DATE:  11/08/15 LOS 10 Barkley Bruns MD: Lars Masson, A  CHIEF COMPLAINT:  Altered mental status, hypercarbic resp failure.    CULTURES: negative   ANTIBIOTICS: Vanc 02/14 >> 2/16 Azactam 02/14 >> 2/21    LINES/TUBES: ETT 02/14>>> L IJ TLC 2/14>>  SIGNIFICANT EVENTS: CT head 2/13. Chronic age-related changes, old infarcts. No acute abnormality. MRI head 2/13. No acute intracranial process ................................ 2/14  Admit with altered mental status, hypercarbic, failed bipap / intubated - Chest x-ray 2/14 bilateral pleural effusions, basilar opacities 2/17  Remains on lasix gtt, precedex  2/19 :  No acute events overnight.  Pt more alert/awake this am.  Follows commands (when demonstrated first)   11/14/15- pn prn fentanyl. Just started sbt . Daughter at bedside. RN reports patient exhausted and edematous and sometimes agitated . Restart scheduled lasix    11/15/15 - ET tube too low. Got 1 unit prbc for hg < 7gm%. Stool OB x 1 negative. Failed SBT. Lasix held by cards due o cxr improvement and cVP 5. Improving creat but worsening BUN to 90s. Echo pending.  Daughte rreports patien bed bound and in diapers since 2009 but normal mental status . Mild dysphagia at baseline for hard solids +  11/16/15 - EF 35%. CVP up again and cards gave 1 dose lasix. BUN still high 80s. CXr stil congested. Doing SBT x 3h off precedex. Normal WUA per RN. Looks exhausted . FOBT x 2 ngative . DPOA indicating full code + full med care    SUBJECTIVE/OVERNIGHT/INTERVAL HX 11/18/15 - massive hemoptysis post extubation - new finding with emergent bronch and intubation and s/p l;eft ches ttube for pneumothorax. This AM -> on prn sedation. Not on pressors. BUN continues to rise. No hemoptysis this AM  VITAL SIGNS: BP 127/42 mmHg  Pulse 76  Temp(Src) 98.1 F  (36.7 C) (Oral)  Resp 16  Ht  (1.575 m)  Wt 85.9 kg (189 lb 6 oz)  BMI 34.63 kg/m2  SpO2 100%  HEMODYNAMICS: CVP:  [7 mmHg-11 mmHg] 8 mmHg  VENTILATOR SETTINGS: Vent Mode:  [-] PRVC FiO2 (%):  [60 %-100 %] 60 % Set Rate:  [16 bmp] 16 bmp Vt Set:  [400 mL] 400 mL PEEP:  [5 cmH20] 5 cmH20 Plateau Pressure:  [24 cmH20-29 cmH20] 29 cmH20  INTAKE / OUTPUT: I/O last 3 completed shifts: In: 1036.9 [I.V.:276.9; NG/GT:760] Out: 3485 [Urine:3485]  PHYSICAL EXAMINATION: General: Chronically ill appearing elderly female, on vent. LOOKS DECONDITIONED. LOOKS EXHAUSTED  Neuro:  RASS -3 on prn sedation HEENT: [place]/AT, PERRL, EOM-I and MMM. Cardiovascular:  RRR, Nl S1/S2, -M/R/G. Lungs:  Even/non-labored on vent, lungs bilaterally diminished  Abdomen: Soft, NT, ND and +BS. Ext: 1+ edema and -tenderness. Skin: Intact.   LABS:  PULMONARY  Recent Labs Lab 11/11/15 0333 11/12/15 0344 11/14/15 1100 11/15/15 0710 11/16/15 0435 11/17/15 0145 11/17/15 0400  PHART 7.482* 7.368  --   --   --  7.445  --   PCO2ART 35.6 35.8  --   --   --  46.8*  --   PO2ART 140* 74.7*  --   --   --  194.0*  --   HCO3 26.2* 21.5  --   --   --  32.1*  --   TCO2 27.3 22.8  --   --   --  34  --  O2SAT 99.2 96.5 77.7 70.5 70.4 100.0 62.3    CBC  Recent Labs Lab 11/15/15 1645 11/16/15 0446 11/17/15 0035 11/17/15 0345  HGB 8.3* 8.1* 8.5* 8.4*  HCT 26.4* 26.2* 28.6* 27.8*  WBC 6.5 5.6  --  10.3  PLT 115* 121*  --  147*    COAGULATION  Recent Labs Lab 11/17/15 0035  INR 1.18    CARDIAC    Recent Labs Lab 11/17/15 0345 11/17/15 0931  TROPONINI 1.74* 2.00*   No results for input(s): PROBNP in the last 168 hours.   CHEMISTRY  Recent Labs Lab 11/11/15 0435  11/12/15 0340  11/14/15 1842 11/15/15 0435 11/16/15 0446 11/17/15 0035 11/17/15 0345  NA 134*  < > 134*  < > 132* 136 137 143 143  K 2.5*  < > 3.6  < > 3.3* 3.6 3.0* 4.0 3.8  CL 94*  < > 94*  < > 90* 92* 96* 100*  99*  CO2 27  < > 26  < > 29 30 30 29 30   GLUCOSE 157*  < > 177*  < > 236* 181* 146* 164* 135*  BUN 45*  < > 59*  < > 87* 93* 99* 100* 101*  CREATININE 1.09*  < > 1.23*  < > 1.07* 1.06* 0.93 0.95 1.00  CALCIUM 7.7*  < > 8.0*  < > 8.1* 8.2* 8.5* 8.6* 8.6*  MG 1.8  --  2.3  --  2.0 2.2 2.4  --  2.4  PHOS 3.9  --  4.2  --   --  4.7* 4.3  --  5.1*  < > = values in this interval not displayed. Estimated Creatinine Clearance: 44.1 mL/min (by C-G formula based on Cr of 1).   LIVER  Recent Labs Lab 11/17/15 0035  INR 1.18     INFECTIOUS No results for input(s): LATICACIDVEN, PROCALCITON in the last 168 hours.   ENDOCRINE CBG (last 3)   Recent Labs  11/17/15 0031 11/17/15 0354 11/17/15 0812  GLUCAP 150* 100* 125*         IMAGING x48h  - image(s) personally visualized  -   highlighted in bold Dg Chest Port 1 View  11/17/2015  CLINICAL DATA:  Status post left-sided chest tube placement. Assess for residual pneumothorax. Initial encounter. EXAM: PORTABLE CHEST 1 VIEW COMPARISON:  Chest radiograph performed earlier today at 12:12 a.m. FINDINGS: The patient's endotracheal tube is seen ending 2-3 cm above the carina. The enteric tube is noted ending overlying the antrum of the stomach. A left IJ line is noted ending about the mid to distal SVC. A left-sided chest tube is noted.  No residual pneumothorax is seen. The lungs are hypoexpanded. A small right pleural effusion is noted. Right basilar and left perihilar airspace opacity raise concern for pneumonia. No pneumothorax is seen. The cardiomediastinal silhouette is mildly enlarged. The patient is status post median sternotomy, with evidence of prior CABG. No acute osseous abnormalities are seen. IMPRESSION: 1. Status post left-sided chest tube placement. No residual pneumothorax seen. 2. Endotracheal tube seen ending 2-3 cm above the carina. 3. Lungs hypoexpanded, with small right pleural effusion. Right basilar and left perihilar  airspace opacities raise concern for pneumonia. 4. Mild cardiomegaly. Electronically Signed   By: Roanna Raider M.D.   On: 11/17/2015 03:46   Portable Chest Xray  11/17/2015  CLINICAL DATA:  Respiratory failure.  Re- intubated. EXAM: PORTABLE CHEST 1 VIEW COMPARISON:  Earlier this day at 1314 hour FINDINGS: Endotracheal tube is  4.6 cm from the carina. Enteric tube is in place, tip below the diaphragm. Left central line tip in the mid SVC. Small apical lateral left pneumothorax, approximately 10%. No mediastinal shift. Suggestion of pneumomediastinum or pneumopericardium outlining the inferior heart border. Lucency about the left heart border is well. Decreasing pleural effusions and bibasilar opacities. Worsening interstitial prominence on the right. Cardiomegaly again seen. IMPRESSION: 1. Endotracheal tube 4.6 cm from the carina. 2. New small left pneumothorax. Air outlining the inferior heart border, pneumomediastinum versus pneumopericardium. Abnormal lucency about the left heart border, secondary to extrapleural versus mediastinal air. 3. Improving bilateral pleural effusions. Increasing interstitial opacities in the right perihilar lung, may be asymmetric pulmonary edema. Critical Value/emergent results were called by telephone at the time of interpretation on 11/17/2015 at 1:18 am to Dr. Silvestre Gunner, who verbally acknowledged these results. Electronically Signed   By: Rubye Oaks M.D.   On: 11/17/2015 01:20   Dg Chest Port 1 View  11/16/2015  CLINICAL DATA:  Hemoptysis. EXAM: PORTABLE CHEST 1 VIEW COMPARISON:  11/16/2015. FINDINGS: The endotracheal and nasogastric tubes have been removed. Stable left jugular catheter. Stable enlarged cardiac silhouette and post CABG changes. Mildly increased bibasilar airspace opacity. Small bilateral pleural effusions. Stable prominence of the pulmonary vasculature and interstitial markings. Increased linear density in the left mid lung zone. Diffuse osteopenia.  IMPRESSION: 1. Increased bibasilar pneumonia or atelectasis. 2. Small bilateral pleural effusions. 3. Otherwise, stable cardiomegaly and changes of congestive heart failure. 4. Increased atelectasis though the left mid lung zone. Electronically Signed   By: Beckie Salts M.D.   On: 11/16/2015 13:24   Dg Chest Port 1 View  11/16/2015  CLINICAL DATA:  Hypoxia EXAM: PORTABLE CHEST 1 VIEW COMPARISON:  November 15, 2015 FINDINGS: Endotracheal tube tip is 1.3 cm above the carina. Nasogastric tube tip and side port are in the stomach. Central catheter tip is in the superior vena cava. No pneumothorax. There is bibasilar atelectatic change with small pleural effusions bilaterally, stable. Heart is enlarged. There is mild pulmonary venous hypertension. There is atherosclerotic calcification aorta. Patient is status post coronary artery bypass grafting. No adenopathy evident. IMPRESSION: Tube and catheter positions as described without pneumothorax. Evidence of a degree of congestive heart failure, unchanged. No new opacity. No change in cardiac silhouette. Electronically Signed   By: Bretta Bang III M.D.   On: 11/16/2015 07:16   Dg Abd Portable 1v  11/17/2015  CLINICAL DATA:  Enteric tube placement.  Initial encounter. EXAM: PORTABLE ABDOMEN - 1 VIEW COMPARISON:  CT of the abdomen and pelvis performed 10/13/2015 FINDINGS: The patient's enteric tube is noted ending overlying the body of the stomach. The stomach is relatively decompressed. There is diffuse distention of small and large bowel loops, concerning for mild ileus. No free intra-abdominal air is seen, though evaluation for free air is limited given the patient's habitus and on a single supine view. No acute osseous abnormalities are seen. An external pacing pad is noted. The patient is status post median sternotomy. IMPRESSION: 1. Enteric tube noted ending overlying the body of the stomach. 2. Diffuse distention of small and large bowel loops is concerning  for mild ileus. Electronically Signed   By: Roanna Raider M.D.   On: 11/17/2015 01:03      ASSESSMENT / PLAN:  PULMONARY A: Hypercarbic respiratory failure - in setting of CHF, effusions and LLL PNA,  - 2/14 - failed bipap and intubated  - 11/16/15 - failed extubation with onset of hemoptysis and  left ptx an dreintubated 2/23. CLot sent to path dept   - does not meet extubation criteria  P:   Full vent suppot Albuterol BID  Duoneb Q4 PRN  Lasix as below  Await pathology of clot 10/28/15 Check trach aspirate culture 11/17/15 Get CT cheset   CARDIOVASCULAR A:  Coronary artery disease. S/p multiple stents and CABG. Hx HTN, CHF   - siginficant 3rd spacing + , Systolic CHF acute on chronic - s/p repeat lasix x 1 2/22   P:  per card  RENAL A:   AKI.  imprved but bun worsening. FOBT x 2 negative  P:   Correct electrolytes as indicated Follow BMP / UOP  Check Autoimmune and vasculitis panel 11/17/15 -  Renal consult depeding on above  GASTROINTESTINAL A:   GI prophylaxis. Nutrition. Ammonia elevated, likely hepatic congestion. Constipation    -  tF P:   SUP: pantoprazole. TF NPO post exutbation Will need swallow eval. Senakot 2 tablets BID. Miralax once a day.   HEMATOLOGIC A:   Anemia. Thrombocytopenia. VTE prophylaxis.   - prbc < 7gm% and got 1 unit PRBc - FOBT x 2 negative  P:  Transfuse for Hgb < 7. Monitor platelet counts. SCD's. Monitor CBC   INFECTIOUS A:   Recent UTI and LLL PNA. S/p azactam ending 11/15/15 P:   Cultures as above  Continue aztreonam, D8/8  ENDOCRINE A:   Diabetes mellitus. P:   SSI.  NEUROLOGIC A:   #Baseline   - old strokes . Bed bound x 2009 but normal conversation. In diapers  #Now Acute hypercarbic + hepatic encephalopathy - failed BiPAP and required intubation PM of 02/14.   - apppears very deconditioned s  P Prn sedation RASS goal: 0 to -1. Daily WUA. Continue Primidone for parkinson.  FAMILY   - Updates: No family available am 2/19.  Daughter at bedside updated.DPOA Raja updated on phone 11/14/15  - Inter-disciplinary family meet or Palliative Care meeting due by:  2/21. - done with dpoa Raja and RN Scarlette Calico at bedside 11/15/15 - daughter body language is of full code + full medical care. Discussed IDT again with RN Scarlette Calico -> DPOA says "this is my mom and you have to do everything". Reintubation ok.  Dont again 11/17/15 with RN Archie Patten and DPOA Angelyn Punt  -> explained heading into chronic critical lillnesss and implications of trach, ltac and uncertainties v comfort approach. Daughter is not sure. Feels LTAC approach is not helpful but does not want to give up on mom. Will need collective family input as well. She has agreed to meet pall care  GLOBAL Full code. Get ct chest. Pall called. Checking autoimmune   The patient is critically ill with multiple organ systems failure and requires high complexity decision making for assessment and support, frequent evaluation and titration of therapies, application of advanced monitoring technologies and extensive interpretation of multiple databases.   Critical Care Time devoted to patient care services described in this note is  30  Minutes. This time reflects time of care of this signee Dr Kalman Shan. This critical care time does not reflect procedure time, or teaching time or supervisory time of PA/NP/Med student/Med Resident etc but could involve care discussion time    Dr. Kalman Shan, M.D., [facility].C.P Pulmonary and Critical Care Medicine Staff Physician [facility] Pulmonary and Critical Care Pager: [phone], If no answer or between  15:00h - 7:00h: call [phone]  11/17/2015 11:02 AM

## 2015-11-17 NOTE — Procedures (Signed)
Bedside Bronchoscopy Procedure Note  Pre-Procedure Diagnoses: 1.  Acute hypoxic respiratory failure 2.  Hemoptysis  Procedures Performed: 1. Bronchoscopy with Airway Inspection 2. Suctioning of clots and endotracheal tube plug  Consent:  Procedure was performed emergently without consent after second intubation.  Medications Administered During Conscious Sedation: 1. Versed 6 mg IV 2. Fentanyl 100 mcg IV  Description of Procedure: After patient was intubated by myself for the second time medication for conscious sedation was administered. Flexible bronchoscope was then inserted into the endotracheal tube. An airway inspeciton was performed finding a large mass of tissue or clot obstructing the end of the endotracheal tube adherent to the tracheal wall. This mass was intermittently obstructing the end of the endotracheal tube. Proximal airways were inspected and found to have thin, bloody secretions that were easily suctioned.  Gentle saline lavage was performed to loosen adherent clot/tissue. Clot/tissue was ultimately able to be expectorated and removed through the end of the endotracheal tube.  Remaining secretions were suctioned from the endotracheal tube and proximal airways. No active source of bleeding was appreciated.  Blood Loss:  Less than 50 cc.  Complications:  Transient desaturation that responded to suctioning & bag mask ventilation.  Post Procedure Stat Portable CXR:  Ordered and pending.  Post Procedure Instructions: 1. Patient to remain intubated in the intensive care unit 2. Sending endotracheal clot specimen for pathology review

## 2015-11-17 NOTE — Progress Notes (Signed)
Cleaned patient up after BM maintaining HOB at 30 degrees d/t inability to tolerate HOB lower and [place] increased to 5 L during turning. After completion, patient returned to supine position with HOB elevated 45 degrees at patient/daughter's request. Patient with increased WOB & increased anxiety with very ronchi lung sounds. Breathing treatment given and patient pulled up in bed. Patient seems to have AMS but difficult to assess d/t language barrier. CCM notified.Daughter at bedside advised.

## 2015-11-17 NOTE — Procedures (Signed)
Medications Administered: 1. Etomidate 25 mg IV push  Description of Procedure: Patient was laid recumbent in the bed. Etomidate IV push was administered with rapid sedation and cessation of spontaneous breathing. A [employer]osh #3 blade with Glidescope was inserted into the patient's posterior pharynx with visualized blood and clots that were suctioned easily. Patient did have significant inflammation and swelling of the retinoids. Excellent view of the vocal cords. An endotracheal tube 7.5 was inserted between the vocal cords with ease to 23 cm. Bilateral breath sounds were auscultated. Patient had excellent repeat capnographic color change. No complications during the procedure.

## 2015-11-18 ENCOUNTER — Ambulatory Visit: Payer: Medicare Other | Admitting: Family Medicine

## 2015-11-18 ENCOUNTER — Inpatient Hospital Stay (HOSPITAL_COMMUNITY): Payer: Medicare Other

## 2015-11-18 DIAGNOSIS — R609 Edema, unspecified: Secondary | ICD-10-CM

## 2015-11-18 LAB — GLUCOSE, CAPILLARY
GLUCOSE-CAPILLARY: 110 mg/dL — AB (ref 65–99)
GLUCOSE-CAPILLARY: 112 mg/dL — AB (ref 65–99)
GLUCOSE-CAPILLARY: 112 mg/dL — AB (ref 65–99)
GLUCOSE-CAPILLARY: 131 mg/dL — AB (ref 65–99)
GLUCOSE-CAPILLARY: 131 mg/dL — AB (ref 65–99)
Glucose-Capillary: 121 mg/dL — ABNORMAL HIGH (ref 65–99)
Glucose-Capillary: 131 mg/dL — ABNORMAL HIGH (ref 65–99)

## 2015-11-18 LAB — ANTINUCLEAR ANTIBODIES, IFA: ANA Ab, IFA: POSITIVE — AB

## 2015-11-18 LAB — ANTI-DNA ANTIBODY, DOUBLE-STRANDED: DS DNA AB: 1 [IU]/mL (ref 0–9)

## 2015-11-18 LAB — CBC WITH DIFFERENTIAL/PLATELET
Basophils Absolute: 0 10*3/uL (ref 0.0–0.1)
Basophils Relative: 0 %
Eosinophils Absolute: 0 10*3/uL (ref 0.0–0.7)
Eosinophils Relative: 0 %
HCT: 23.3 % — ABNORMAL LOW (ref 36.0–46.0)
Hemoglobin: 7.1 g/dL — ABNORMAL LOW (ref 12.0–15.0)
LYMPHS PCT: 17 %
Lymphs Abs: 1.1 10*3/uL (ref 0.7–4.0)
MCH: 27 pg (ref 26.0–34.0)
MCHC: 30.5 g/dL (ref 30.0–36.0)
MCV: 88.6 fL (ref 78.0–100.0)
Monocytes Absolute: 0.5 10*3/uL (ref 0.1–1.0)
Monocytes Relative: 8 %
NEUTROS ABS: 4.8 10*3/uL (ref 1.7–7.7)
NEUTROS PCT: 75 %
PLATELETS: 158 10*3/uL (ref 150–400)
RBC: 2.63 MIL/uL — AB (ref 3.87–5.11)
RDW: 20 % — ABNORMAL HIGH (ref 11.5–15.5)
WBC: 6.5 10*3/uL (ref 4.0–10.5)

## 2015-11-18 LAB — BASIC METABOLIC PANEL
Anion gap: 13 (ref 5–15)
BUN: 100 mg/dL — AB (ref 6–20)
CALCIUM: 8.8 mg/dL — AB (ref 8.9–10.3)
CO2: 28 mmol/L (ref 22–32)
CREATININE: 1.03 mg/dL — AB (ref 0.44–1.00)
Chloride: 101 mmol/L (ref 101–111)
GFR calc Af Amer: 57 mL/min — ABNORMAL LOW (ref >60)
GFR, EST NON AFRICAN AMERICAN: 49 mL/min — AB (ref >60)
Glucose, Bld: 132 mg/dL — ABNORMAL HIGH (ref 65–99)
POTASSIUM: 3.1 mmol/L — AB (ref 3.5–5.1)
SODIUM: 142 mmol/L (ref 135–145)

## 2015-11-18 LAB — MPO/PR-3 (ANCA) ANTIBODIES: ANCA Proteinase 3: <3.5 U/mL (ref 0.0–3.5)

## 2015-11-18 LAB — CARBOXYHEMOGLOBIN
Carboxyhemoglobin: 2.2 % — ABNORMAL HIGH (ref 0.5–1.5)
METHEMOGLOBIN: 0.8 % (ref 0.0–1.5)
O2 Saturation: 83.3 %
TOTAL HEMOGLOBIN: 7.5 g/dL — AB (ref 12.0–16.0)

## 2015-11-18 LAB — ANCA TITERS: C-ANCA: <1:20 {titer}

## 2015-11-18 LAB — VANCOMYCIN, RANDOM: Vancomycin Rm: 31 ug/mL

## 2015-11-18 LAB — TRIGLYCERIDES: Triglycerides: 148 mg/dL (ref <150)

## 2015-11-18 LAB — FANA STAINING PATTERNS

## 2015-11-18 LAB — RHEUMATOID FACTOR: Rhuematoid fact SerPl-aCnc: 12.6 IU/mL (ref 0.0–13.9)

## 2015-11-18 LAB — MAGNESIUM: Magnesium: 2.3 mg/dL (ref 1.7–2.4)

## 2015-11-18 LAB — ANTI-SCLERODERMA ANTIBODY

## 2015-11-18 LAB — CYCLIC CITRUL PEPTIDE ANTIBODY, IGG/IGA: CCP ANTIBODIES IGG/IGA: 7 U (ref 0–19)

## 2015-11-18 LAB — PROCALCITONIN: PROCALCITONIN: 1.73 ng/mL

## 2015-11-18 LAB — PHOSPHORUS: PHOSPHORUS: 4.4 mg/dL (ref 2.5–4.6)

## 2015-11-18 LAB — GLOMERULAR BASEMENT MEMBRANE ANTIBODIES: GBM Ab: 3 units (ref 0–20)

## 2015-11-18 MED ORDER — POTASSIUM CHLORIDE 20 MEQ/15ML (10%) PO SOLN
40.0000 meq | ORAL | Status: AC
  Administered 2015-11-18 (×2): 40 meq
  Filled 2015-11-18 (×2): qty 30

## 2015-11-18 MED ORDER — VANCOMYCIN HCL IN DEXTROSE 750-5 MG/150ML-% IV SOLN
750.0000 mg | Freq: Two times a day (BID) | INTRAVENOUS | Status: DC
  Administered 2015-11-18 – 2015-11-20 (×4): 750 mg via INTRAVENOUS
  Filled 2015-11-18 (×5): qty 150

## 2015-11-18 MED ORDER — VITAL HIGH PROTEIN PO LIQD: 1000.0000 mL | ORAL | Status: DC

## 2015-11-18 MED ORDER — DEXTROSE 5 % IV SOLN
1.0000 g | Freq: Three times a day (TID) | INTRAVENOUS | Status: AC
  Administered 2015-11-18 – 2015-11-24 (×18): 1 g via INTRAVENOUS
  Filled 2015-11-18 (×22): qty 1

## 2015-11-18 MED ORDER — NEPRO/CARBSTEADY PO LIQD
1000.0000 mL | ORAL | Status: DC
  Administered 2015-11-18 – 2015-11-21 (×4): 1000 mL
  Filled 2015-11-18 (×6): qty 1000

## 2015-11-18 MED ORDER — PRO-STAT SUGAR FREE PO LIQD
60.0000 mL | Freq: Three times a day (TID) | ORAL | Status: DC
  Administered 2015-11-18 – 2015-11-25 (×20): 60 mL
  Filled 2015-11-18 (×21): qty 60

## 2015-11-18 MED ORDER — FUROSEMIDE 10 MG/ML IJ SOLN
40.0000 mg | Freq: Once | INTRAMUSCULAR | Status: AC
  Administered 2015-11-18: 40 mg via INTRAVENOUS
  Filled 2015-11-18: qty 4

## 2015-11-18 MED ORDER — SODIUM CHLORIDE 0.9 % IV SOLN
INTRAVENOUS | Status: DC
  Administered 2015-11-19 – 2015-11-21 (×3): via INTRAVENOUS

## 2015-11-18 NOTE — Progress Notes (Signed)
Placed safety mitts on pt d/t hands going up to pull at tube. Family at bedside educated why they needed to be on. Went back into room family had taken the safety mitts off. Educated them again as to why they needed to be on.

## 2015-11-18 NOTE — Progress Notes (Signed)
Pharmacy Antibiotic Note  [name] is a 82 y.o. female admitted on 11/07/2015 with pneumonia.  Pharmacy has been consulted for vancomycin and aztreonam dosing.  Plan: 1. Vancomycin 750 mg IV q 12 hrs. 2. Aztreonam 1g IV q 8 hrs. 3. Will f/u cultures, renal function, and clinical course.   Height:  (157.5 cm) Weight: 189 lb 6 oz (85.9 kg) IBW/kg (Calculated) : 50.1  Temp (24hrs), Avg:98.5 F (36.9 C), Min:98.1 F (36.7 C), Max:99 F (37.2 C)   Recent Labs Lab 11/15/15 0435 11/15/15 1645 11/16/15 0446 11/17/15 0035 11/17/15 0345 11/18/15 0422  WBC 4.2 6.5 5.6  --  10.3 6.5  CREATININE 1.06*  --  0.93 0.95 1.00 1.03*    Estimated Creatinine Clearance: 42.8 mL/min (by C-G formula based on Cr of 1.03).    Allergies  Allergen Reactions  . Penicillins Anaphylaxis, Itching and Swelling    Has patient had a PCN reaction causing immediate rash, facial/tongue/throat swelling, SOB or lightheadedness with hypotension: Yes Has patient had a PCN reaction causing severe rash involving mucus membranes or skin necrosis: No  Has patient had a PCN reaction that required hospitalization: Unknown Has patient had a PCN reaction occurring within the last 10 years: No  If all of the above answers are "NO", then may proceed with Cephalosporin use.     Antimicrobials this admission: 2/14 aztreonam > 2/21 2/14 vanc >2/15 Resumed 2/24 > 2/14 doxy >2/14 2/24 azactam >  Dose adjustments this admission: n/a Microbiology results: 2/14 blood cx: ngf 2/14 urine cx: ngf  Thank you for allowing pharmacy to be a part of this patient's care.  Tad Moore, BCPS  Clinical Pharmacist Pager [phone]  11/18/2015 11:26 AM

## 2015-11-18 NOTE — Consult Note (Signed)
[facility] Renal Consultation Note  Requesting MD: Ramaswamy  Indication for Consultation: elevated BUN   HPI:  [name] is a 82 y.o. female with past medical history significant for type 2 diabetes mellitus, hypertension, previous stroke, CAD status post bypass in 2000,  chronic anemia, reportedly essentially bedbound although I cannot find that in the chart. Status post hospitalization in [facility] for a UTI. She was brought to the emergency department on February 14 with complaints of decreased mental status. She was found to have hypercarbic respiratory failure, was initially placed on BiPAP but then required intubation.  Has had one failed extubation with hemoptysis at the time noted. In looking at the ins and outs it seems that she's been much more out than in throughout her hospitalization with a running total being negative over 7 L on Lasix.- She also has been receiving high protein tube feeds.  I am asked to come see for elevated BUN to creatinine ratio -  today creatinine is 1.03 and BUN is 100. UOP is reasonable. Urine output mainly showed granular casts on 2/23 but also some pyuria    CREATININE  Date/Time Value Ref Range Status  09/27/2015 03:23 PM 0.9 0.6 - 1.1 mg/dL Final  02/19/2015 0.8 .5 - 1.1 mg/dL Final   CREAT  Date/Time Value Ref Range Status  04/12/2014 02:09 PM 0.87 0.50 - 1.10 mg/dL Final  04/29/2013 01:45 PM 0.95 0.50 - 1.10 mg/dL Final   CREATININE, SER  Date/Time Value Ref Range Status  11/18/2015 04:22 AM 1.03* 0.44 - 1.00 mg/dL Final  11/17/2015 03:45 AM 1.00 0.44 - 1.00 mg/dL Final  11/17/2015 12:35 AM 0.95 0.44 - 1.00 mg/dL Final  11/16/2015 04:46 AM 0.93 0.44 - 1.00 mg/dL Final  11/15/2015 04:35 AM 1.06* 0.44 - 1.00 mg/dL Final  11/14/2015 06:42 PM 1.07* 0.44 - 1.00 mg/dL Final  11/14/2015 04:00 AM 1.09* 0.44 - 1.00 mg/dL Final  11/13/2015 04:18 AM 1.16* 0.44 - 1.00 mg/dL Final  11/12/2015 03:40 AM 1.23* 0.44 - 1.00 mg/dL Final   11/11/2015 02:00 PM 1.13* 0.44 - 1.00 mg/dL Final  11/11/2015 04:35 AM 1.09* 0.44 - 1.00 mg/dL Final  11/10/2015 04:15 AM 1.18* 0.44 - 1.00 mg/dL Final  11/09/2015 12:00 PM 1.30* 0.44 - 1.00 mg/dL Final  11/09/2015 04:30 AM 1.29* 0.44 - 1.00 mg/dL Final  11/08/2015 02:20 AM 1.48* 0.44 - 1.00 mg/dL Final  11/07/2015 05:50 PM 1.50* 0.44 - 1.00 mg/dL Final  11/07/2015 05:47 PM 1.43* 0.44 - 1.00 mg/dL Final  10/26/2015 11:56 AM 1.20 0.40 - 1.20 mg/dL Final  09/03/2015 04:39 PM 1.05* 0.44 - 1.00 mg/dL Final  09/02/2015 03:37 PM 1.00 0.44 - 1.00 mg/dL Final  09/02/2015 07:15 AM 0.90 0.44 - 1.00 mg/dL Final  09/01/2015 08:09 PM 0.95 0.44 - 1.00 mg/dL Final  12/03/2011 02:20 PM 0.68 0.50 - 1.10 mg/dL Final  10/04/2011 03:45 PM 1.08 0.50 - 1.10 mg/dL Final  09/29/2011 12:30 AM 0.93 0.50 - 1.10 mg/dL Final  09/28/2011 08:31 AM 1.03 0.50 - 1.10 mg/dL Final  09/26/2011 10:33 PM 1.13* 0.50 - 1.10 mg/dL Final  09/26/2011 05:14 AM 1.34* 0.50 - 1.10 mg/dL Final  09/25/2011 05:44 AM 1.02 0.50 - 1.10 mg/dL Final  09/24/2011 04:55 AM 0.93 0.50 - 1.10 mg/dL Final  09/23/2011 06:30 AM 0.91 0.50 - 1.10 mg/dL Final  09/22/2011 05:25 AM 0.93 0.50 - 1.10 mg/dL Final  09/21/2011 07:00 AM 1.04 0.50 - 1.10 mg/dL Final  09/20/2011 04:55 AM 1.12* 0.50 - 1.10 mg/dL  Final  09/19/2011 04:40 PM 1.06 0.50 - 1.10 mg/dL Final  09/16/2011 08:52 AM 1.26* 0.50 - 1.10 mg/dL Final  09/15/2011 03:52 AM 1.15* 0.50 - 1.10 mg/dL Final  09/13/2011 05:00 AM 1.15* 0.50 - 1.10 mg/dL Final  09/12/2011 06:10 PM 1.04 0.50 - 1.10 mg/dL Final  06/11/2011 05:30 AM 0.64 0.50 - 1.10 mg/dL Final  06/09/2011 05:45 AM 0.70 0.50 - 1.10 mg/dL Final  06/08/2011 05:42 PM 0.75 0.50 - 1.10 mg/dL Final  05/30/2011 06:15 PM 0.91 0.50 - 1.10 mg/dL Final  01/16/2011 09:42 AM 0.82 0.4 - 1.2 mg/dL Final  01/14/2011 03:35 AM 0.88 0.4 - 1.2 mg/dL Final  01/13/2011 06:10 AM 0.97 0.4 - 1.2 mg/dL Final  01/12/2011 01:30 AM 1.35* 0.4 - 1.2 mg/dL Final   01/11/2011 04:50 AM 1.54* 0.4 - 1.2 mg/dL Final  01/10/2011 03:15 AM 1.17 0.4 - 1.2 mg/dL Final  01/09/2011 04:45 AM 0.93 0.4 - 1.2 mg/dL Final  01/08/2011 02:26 AM 1.20 0.4 - 1.2 mg/dL Final  04/12/2009 01:55 AM 1.18 mg/dL Final     PMHx:   Past Medical History  Diagnosis Date  . Diabetes mellitus   . Hypertension   . Stroke [facility]) Right Side Weakness  . CHF (congestive heart failure) ([place])     2D ECHO, 09/13/2011 - EF 40-45%, normal  . 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 Pain in limb     BILATERAL EXTREMITY VENOUS DUPLEX, 01/08/2011 - no evidence of deep vein or superficial thrombosis or Baker's cyst    Past Surgical History  Procedure Laterality Date  . Cardiac surgery    . Cardiac catheterization  10/27/2004    3 stents placed, 3.5x28 proximally,3.5x33 mid segment, and 3.5x33 in the region of the crux, stenosis being reduced to 0% at proximal and mid segment and being reduced from 80% ot 10% in the stent at the cruxed segment  . Cardiac catheterization  09/26/2004    High grade 95% ostial stenosis in the RCA with 70% mid stenosis, 95% distal stenosis and total occlusion of the PDA with collaterals  . Coronary artery bypass graft  02/24/1999    x4; IMA to distal LAD, left radial to first circ, SVG to first diagonal, SVG to posterior descending coronary artery    Family Hx:  Family History  Problem Relation Age of Onset  . Cancer Mother     Lung  . Heart disease Brother   . Hypertension Brother   . Heart disease Brother   . Hypertension Brother     Social History:  reports that she has never smoked. She has never used smokeless tobacco. She reports that she does not drink alcohol or use illicit drugs.  Allergies:  Allergies  Allergen Reactions  . Penicillins Anaphylaxis, Itching and Swelling    Has patient had a PCN  reaction causing immediate rash, facial/tongue/throat swelling, SOB or lightheadedness with hypotension: Yes Has patient had a PCN reaction causing severe rash involving mucus membranes or skin necrosis: No  Has patient had a PCN reaction that required hospitalization: Unknown Has patient had a PCN reaction occurring within the last 10 years: No  If all of the above answers are "NO", then may proceed with Cephalosporin use.     Medications: Prior to Admission medications   Medication Sig Start Date End Date Taking? Authorizing Provider  albuterol (PROVENTIL) (2.5  MG/3ML) 0.083% nebulizer solution Take 2.5 mg by nebulization 2 (two) times daily.  10/21/15 10/20/16 Yes Historical Provider, MD  albuterol-ipratropium (COMBIVENT) 18-103 MCG/ACT inhaler Inhale 1 puff into the lungs every 4 (four) hours as needed.  10/21/15 10/20/16 Yes Historical Provider, MD  allopurinol (ZYLOPRIM) 100 MG tablet Take 100 mg by mouth daily. 03/08/14  Yes Historical Provider, MD  atorvastatin (LIPITOR) 40 MG tablet Take 1 tablet (40 mg total) by mouth daily. Patient taking differently: Take 40 mg by mouth daily at 6 PM.  10/26/15  Yes Midge Minium, MD  clopidogrel (PLAVIX) 75 MG tablet Take 1 tablet (75 mg total) by mouth daily. 09/12/15  Yes Brett Canales, PA-C  colchicine 0.6 MG tablet Take 0.6 mg by mouth daily.    Yes Historical Provider, MD  CRANBERRY CONCENTRATE PO Take 1 tablet by mouth 2 (two) times daily.   Yes Historical Provider, MD  folic acid (FOLVITE) 1 MG tablet TAKE ONE TABLET BY MOUTH ONCE DAILY 09/27/15  Yes Troy Sine, MD  isosorbide mononitrate (IMDUR) 60 MG 24 hr tablet Take 1 tablet (60 mg total) by mouth daily. NEED OV. 08/16/15  Yes Troy Sine, MD  JANUVIA 50 MG tablet Take 50 mg by mouth daily with breakfast.  04/09/14  Yes Historical Provider, MD  metoprolol (LOPRESSOR) 50 MG tablet Take 1 tablet (50 mg total) by mouth 2 (two) times daily. 08/16/15  Yes Troy Sine, MD  nitroGLYCERIN  (NITROSTAT) 0.4 MG SL tablet Place 1 tablet (0.4 mg total) under the tongue as needed. 08/16/15  Yes Troy Sine, MD  omega-3 acid ethyl esters (LOVAZA) 1 G capsule TAKE TWO CAPSULES BY MOUTH TWICE DAILY Patient taking differently: Take 1 g by mouth 2 (two) times daily.  08/16/15  Yes Troy Sine, MD  pantoprazole (PROTONIX) 40 MG tablet Take 1 tablet (40 mg total) by mouth daily. Make appt for refills. 08/16/15  Yes Troy Sine, MD  polysaccharide iron (NIFEREX) 150 MG CAPS capsule Take 150 mg by mouth 2 (two) times daily.    Yes Historical Provider, MD  primidone (MYSOLINE) 50 MG tablet Take 50 mg by mouth at bedtime.    Yes Historical Provider, MD  senna (SENOKOT) 8.6 MG tablet Take 1 tablet by mouth 2 (two) times daily.    Yes Historical Provider, MD  torsemide (DEMADEX) 20 MG tablet Take 2 tablets (40 mg total) by mouth daily. Patient taking differently: Take 20 mg by mouth 2 (two) times daily.  11/01/15  Yes Erlene Quan, PA-C    I have reviewed the patient's current medications.  Labs:  Results for orders placed or performed during the hospital encounter of 11/07/15 (from the past 48 hour(s))  Glucose, capillary     Status: Abnormal   Collection Time: 11/16/15  5:02 PM  Result Value Ref Range   Glucose-Capillary 148 (H) 65 - 99 mg/dL  Glucose, capillary     Status: Abnormal   Collection Time: 11/16/15  8:20 PM  Result Value Ref Range   Glucose-Capillary 129 (H) 65 - 99 mg/dL  Glucose, capillary     Status: Abnormal   Collection Time: 11/17/15 12:31 AM  Result Value Ref Range   Glucose-Capillary 150 (H) 65 - 99 mg/dL   Comment 1 Capillary Specimen   Basic metabolic panel     Status: Abnormal   Collection Time: 11/17/15 12:35 AM  Result Value Ref Range   Sodium 143 135 - 145 mmol/L  Potassium 4.0 3.5 - 5.1 mmol/L    Comment: DELTA CHECK NOTED NO VISIBLE HEMOLYSIS    Chloride 100 (L) 101 - 111 mmol/L   CO2 29 22 - 32 mmol/L   Glucose, Bld 164 (H) 65 - 99 mg/dL   BUN  100 (H) 6 - 20 mg/dL   Creatinine, Ser 0.95 0.44 - 1.00 mg/dL   Calcium 8.6 (L) 8.9 - 10.3 mg/dL   GFR calc non Af Amer 54 (L) >60 mL/min   GFR calc Af Amer >60 >60 mL/min    Comment: (NOTE) The eGFR has been calculated using the CKD EPI equation. This calculation has not been validated in all clinical situations. eGFR's persistently <60 mL/min signify possible Chronic Kidney Disease.    Anion gap 14 5 - 15  Protime-INR     Status: None   Collection Time: 11/17/15 12:35 AM  Result Value Ref Range   Prothrombin Time 15.1 11.6 - 15.2 seconds   INR 1.18 0.00 - 1.49  APTT     Status: None   Collection Time: 11/17/15 12:35 AM  Result Value Ref Range   aPTT 35 24 - 37 seconds  Fibrinogen     Status: Abnormal   Collection Time: 11/17/15 12:35 AM  Result Value Ref Range   Fibrinogen 538 (H) 204 - 475 mg/dL  Hemoglobin and hematocrit, blood     Status: Abnormal   Collection Time: 11/17/15 12:35 AM  Result Value Ref Range   Hemoglobin 8.5 (L) 12.0 - 15.0 g/dL   HCT 28.6 (L) 36.0 - 46.0 %  I-STAT 3, arterial blood gas (G3+)     Status: Abnormal   Collection Time: 11/17/15  1:45 AM  Result Value Ref Range   pH, Arterial 7.445 7.350 - 7.450   pCO2 arterial 46.8 (H) 35.0 - 45.0 mmHg   pO2, Arterial 194.0 (H) 80.0 - 100.0 mmHg   Bicarbonate 32.1 (H) 20.0 - 24.0 mEq/L   TCO2 34 0 - 100 mmol/L   O2 Saturation 100.0 %   Acid-Base Excess 7.0 (H) 0.0 - 2.0 mmol/L   Patient temperature 98.7 F    Collection site RADIAL, ALLEN'S TEST ACCEPTABLE    Drawn by Operator    Sample type ARTERIAL   Magnesium     Status: None   Collection Time: 11/17/15  3:45 AM  Result Value Ref Range   Magnesium 2.4 1.7 - 2.4 mg/dL  Phosphorus     Status: Abnormal   Collection Time: 11/17/15  3:45 AM  Result Value Ref Range   Phosphorus 5.1 (H) 2.5 - 4.6 mg/dL  CBC with Differential/Platelet     Status: Abnormal   Collection Time: 11/17/15  3:45 AM  Result Value Ref Range   WBC 10.3 4.0 - 10.5 K/uL   RBC  3.14 (L) 3.87 - 5.11 MIL/uL   Hemoglobin 8.4 (L) 12.0 - 15.0 g/dL   HCT 27.8 (L) 36.0 - 46.0 %   MCV 88.5 78.0 - 100.0 fL   MCH 26.8 26.0 - 34.0 pg   MCHC 30.2 30.0 - 36.0 g/dL   RDW 19.6 (H) 11.5 - 15.5 %   Platelets 147 (L) 150 - 400 K/uL   Neutrophils Relative % 82 %   Neutro Abs 8.4 (H) 1.7 - 7.7 K/uL   Lymphocytes Relative 7 %   Lymphs Abs 0.7 0.7 - 4.0 K/uL   Monocytes Relative 11 %   Monocytes Absolute 1.2 (H) 0.1 - 1.0 K/uL   Eosinophils Relative 0 %  Eosinophils Absolute 0.0 0.0 - 0.7 K/uL   Basophils Relative 0 %   Basophils Absolute 0.0 0.0 - 0.1 K/uL  Basic metabolic panel     Status: Abnormal   Collection Time: 11/17/15  3:45 AM  Result Value Ref Range   Sodium 143 135 - 145 mmol/L   Potassium 3.8 3.5 - 5.1 mmol/L   Chloride 99 (L) 101 - 111 mmol/L   CO2 30 22 - 32 mmol/L   Glucose, Bld 135 (H) 65 - 99 mg/dL   BUN 101 (H) 6 - 20 mg/dL   Creatinine, Ser 1.00 0.44 - 1.00 mg/dL   Calcium 8.6 (L) 8.9 - 10.3 mg/dL   GFR calc non Af Amer 51 (L) >60 mL/min   GFR calc Af Amer 59 (L) >60 mL/min    Comment: (NOTE) The eGFR has been calculated using the CKD EPI equation. This calculation has not been validated in all clinical situations. eGFR's persistently <60 mL/min signify possible Chronic Kidney Disease.    Anion gap 14 5 - 15  Troponin I (q 6hr x 3)     Status: Abnormal   Collection Time: 11/17/15  3:45 AM  Result Value Ref Range   Troponin I 1.74 (HH) <0.031 ng/mL    Comment:        POSSIBLE MYOCARDIAL ISCHEMIA. SERIAL TESTING RECOMMENDED. CRITICAL RESULT CALLED TO, READ BACK BY AND VERIFIED WITH: TOMLINSON,T RN [id number] 2.23.17 MCADOO,G   Glucose, capillary     Status: Abnormal   Collection Time: 11/17/15  3:54 AM  Result Value Ref Range   Glucose-Capillary 100 (H) 65 - 99 mg/dL   Comment 1 Venous Specimen   Carboxyhemoglobin     Status: Abnormal   Collection Time: 11/17/15  4:00 AM  Result Value Ref Range   Total hemoglobin 8.5 (L) 12.0 - 16.0 g/dL    O2 Saturation 62.3 %   Carboxyhemoglobin 1.8 (H) 0.5 - 1.5 %   Methemoglobin 0.6 0.0 - 1.5 %  Glucose, capillary     Status: Abnormal   Collection Time: 11/17/15  8:12 AM  Result Value Ref Range   Glucose-Capillary 125 (H) 65 - 99 mg/dL   Comment 1 Capillary Specimen   Troponin I (q 6hr x 3)     Status: Abnormal   Collection Time: 11/17/15  9:31 AM  Result Value Ref Range   Troponin I 2.00 (HH) <0.031 ng/mL    Comment:        POSSIBLE MYOCARDIAL ISCHEMIA. SERIAL TESTING RECOMMENDED. CRITICAL VALUE NOTED.  VALUE IS CONSISTENT WITH PREVIOUSLY REPORTED AND CALLED VALUE.   Glucose, capillary     Status: Abnormal   Collection Time: 11/17/15 11:27 AM  Result Value Ref Range   Glucose-Capillary 120 (H) 65 - 99 mg/dL   Comment 1 Capillary Specimen   Culture, respiratory (NON-Expectorated)     Status: None (Preliminary result)   Collection Time: 11/17/15 11:35 AM  Result Value Ref Range   Specimen Description TRACHEAL ASPIRATE    Special Requests Normal    Gram Stain      MODERATE WBC PRESENT,BOTH PMN AND MONONUCLEAR RARE SQUAMOUS EPITHELIAL CELLS PRESENT NO ORGANISMS SEEN Performed at [facility]    Culture PENDING    Report Status PENDING   Glucose, capillary     Status: Abnormal   Collection Time: 11/17/15  3:52 PM  Result Value Ref Range   Glucose-Capillary 136 (H) 65 - 99 mg/dL   Comment 1 Capillary Specimen   Urinalysis with microscopic (not at  [place])     Status: Abnormal   Collection Time: 11/17/15  5:52 PM  Result Value Ref Range   Color, Urine YELLOW YELLOW   APPearance CLOUDY (A) CLEAR   Specific Gravity, Urine 1.012 1.005 - 1.030   pH 5.0 5.0 - 8.0   Glucose, UA NEGATIVE NEGATIVE mg/dL   Hgb urine dipstick SMALL (A) NEGATIVE   Bilirubin Urine NEGATIVE NEGATIVE   Ketones, ur NEGATIVE NEGATIVE mg/dL   Protein, ur 30 (A) NEGATIVE mg/dL   Nitrite NEGATIVE NEGATIVE   Leukocytes, UA LARGE (A) NEGATIVE   WBC, UA TOO NUMEROUS TO COUNT 0 - 5 WBC/hpf   RBC /  HPF 0-5 0 - 5 RBC/hpf   Bacteria, UA FEW (A) NONE SEEN   Squamous Epithelial / LPF 0-5 (A) NONE SEEN   Casts GRANULAR CAST (A) NEGATIVE   Urine-Other MUCOUS PRESENT     Comment: YEAST PRESENT  Troponin I (q 6hr x 3)     Status: Abnormal   Collection Time: 11/17/15  6:00 PM  Result Value Ref Range   Troponin I 1.66 (HH) <0.031 ng/mL    Comment:        POSSIBLE MYOCARDIAL ISCHEMIA. SERIAL TESTING RECOMMENDED. CRITICAL VALUE NOTED.  VALUE IS CONSISTENT WITH PREVIOUSLY REPORTED AND CALLED VALUE.   Glomerular basement membrane antibodies     Status: None   Collection Time: 11/17/15  6:00 PM  Result Value Ref Range   GBM Ab 3 0 - 20 units    Comment: (NOTE)                   Negative                   0 - 20                   Weak Positive             21 - 30                   Moderate to Strong Positive   >30 Performed At: [facility] [place], [place] [id number] Lindon Romp MD ON:[id number]   Rheumatoid factor     Status: None   Collection Time: 11/17/15  6:00 PM  Result Value Ref Range   Rhuematoid fact SerPl-aCnc 12.6 0.0 - 13.9 IU/mL    Comment: (NOTE) Performed At: [facility] [place], [place] [id number] Lindon Romp MD DG:[id number]   Glucose, capillary     Status: Abnormal   Collection Time: 11/17/15  8:04 PM  Result Value Ref Range   Glucose-Capillary 132 (H) 65 - 99 mg/dL  Glucose, capillary     Status: Abnormal   Collection Time: 11/17/15 11:24 PM  Result Value Ref Range   Glucose-Capillary 121 (H) 65 - 99 mg/dL   Comment 1 Capillary Specimen   Glucose, capillary     Status: Abnormal   Collection Time: 11/18/15  3:55 AM  Result Value Ref Range   Glucose-Capillary 112 (H) 65 - 99 mg/dL   Comment 1 Venous Specimen   Carboxyhemoglobin     Status: Abnormal   Collection Time: 11/18/15  4:00 AM  Result Value Ref Range   Total hemoglobin 7.5 (L) 12.0 - 16.0 g/dL   O2 Saturation 83.3 %   Carboxyhemoglobin 2.2  (H) 0.5 - 1.5 %   Methemoglobin 0.8 0.0 - 1.5 %  Magnesium     Status: None  Collection Time: 11/18/15  4:22 AM  Result Value Ref Range   Magnesium 2.3 1.7 - 2.4 mg/dL  Phosphorus     Status: None   Collection Time: 11/18/15  4:22 AM  Result Value Ref Range   Phosphorus 4.4 2.5 - 4.6 mg/dL  CBC with Differential/Platelet     Status: Abnormal   Collection Time: 11/18/15  4:22 AM  Result Value Ref Range   WBC 6.5 4.0 - 10.5 K/uL   RBC 2.63 (L) 3.87 - 5.11 MIL/uL   Hemoglobin 7.1 (L) 12.0 - 15.0 g/dL   HCT 23.3 (L) 36.0 - 46.0 %   MCV 88.6 78.0 - 100.0 fL   MCH 27.0 26.0 - 34.0 pg   MCHC 30.5 30.0 - 36.0 g/dL   RDW 20.0 (H) 11.5 - 15.5 %   Platelets 158 150 - 400 K/uL   Neutrophils Relative % 75 %   Neutro Abs 4.8 1.7 - 7.7 K/uL   Lymphocytes Relative 17 %   Lymphs Abs 1.1 0.7 - 4.0 K/uL   Monocytes Relative 8 %   Monocytes Absolute 0.5 0.1 - 1.0 K/uL   Eosinophils Relative 0 %   Eosinophils Absolute 0.0 0.0 - 0.7 K/uL   Basophils Relative 0 %   Basophils Absolute 0.0 0.0 - 0.1 K/uL  Basic metabolic panel     Status: Abnormal   Collection Time: 11/18/15  4:22 AM  Result Value Ref Range   Sodium 142 135 - 145 mmol/L   Potassium 3.1 (L) 3.5 - 5.1 mmol/L    Comment: DELTA CHECK NOTED   Chloride 101 101 - 111 mmol/L   CO2 28 22 - 32 mmol/L   Glucose, Bld 132 (H) 65 - 99 mg/dL   BUN 100 (H) 6 - 20 mg/dL   Creatinine, Ser 1.03 (H) 0.44 - 1.00 mg/dL   Calcium 8.8 (L) 8.9 - 10.3 mg/dL   GFR calc non Af Amer 49 (L) >60 mL/min   GFR calc Af Amer 57 (L) >60 mL/min    Comment: (NOTE) The eGFR has been calculated using the CKD EPI equation. This calculation has not been validated in all clinical situations. eGFR's persistently <60 mL/min signify possible Chronic Kidney Disease.    Anion gap 13 5 - 15  Triglycerides     Status: None   Collection Time: 11/18/15  4:23 AM  Result Value Ref Range   Triglycerides 148 <150 mg/dL  Glucose, capillary     Status: Abnormal    Collection Time: 11/18/15  8:20 AM  Result Value Ref Range   Glucose-Capillary 112 (H) 65 - 99 mg/dL   Comment 1 Capillary Specimen   Procalcitonin - Baseline     Status: None   Collection Time: 11/18/15 11:22 AM  Result Value Ref Range   Procalcitonin 1.73 ng/mL    Comment:        Interpretation: PCT > 0.5 ng/mL and <= 2 ng/mL: Systemic infection (sepsis) is possible, but other conditions are known to elevate PCT as well. (NOTE)         ICU PCT Algorithm               Non ICU PCT Algorithm    ----------------------------     ------------------------------         PCT < 0.25 ng/mL                 PCT < 0.1 ng/mL     Stopping of antibiotics  Stopping of antibiotics       strongly encouraged.               strongly encouraged.    ----------------------------     ------------------------------       PCT level decrease by               PCT < 0.25 ng/mL       >= 80% from peak PCT       OR PCT 0.25 - 0.5 ng/mL          Stopping of antibiotics                                             encouraged.     Stopping of antibiotics           encouraged.    ----------------------------     ------------------------------       PCT level decrease by              PCT >= 0.25 ng/mL       < 80% from peak PCT        AND PCT >= 0.5 ng/mL             Continuing antibiotics                                              encouraged.       Continuing antibiotics            encouraged.    ----------------------------     ------------------------------     PCT level increase compared          PCT > 0.5 ng/mL         with peak PCT AND          PCT >= 0.5 ng/mL             Escalation of antibiotics                                          strongly encouraged.      Escalation of antibiotics        strongly encouraged.   Glucose, capillary     Status: Abnormal   Collection Time: 11/18/15 11:33 AM  Result Value Ref Range   Glucose-Capillary 110 (H) 65 - 99 mg/dL   Comment 1 Capillary Specimen       ROS:  Review of systems not obtained due to patient factors.  Physical Exam: Filed Vitals:   11/18/15 1217 11/18/15 1300  BP: 135/72 121/39  Pulse: 67 61  Temp:    Resp: 16 16     General: Pale, obese white female who is sedated on the vent HEENT: Pupils are reactive, mucous membranes are moist Neck: Difficult to tell JVD Heart: Regular rate and rhythm Lungs: Coarse breath sounds bilaterally Abdomen: Obese, soft, nontender Extremities: doughy- not actually pitting edema Skin: Warm and dry Neuro: Sedated  Assessment/Plan: 82 year old white female with a fairly significant past medical history who is had now a 10 day hospitalization requiring ventilatory support. Other features of hospitalization include hemoptysis, need for high-protein tube feeds  as well as apparent significant diuresis on multiple diuretic medications 1.Renal- BUN elevated out of proportion to creatinine. I think the reason for this is multifactorial. First of all, she seems to have had a significant diuresis since she has been here and has been placed on at any given time Lasix, Diamox, metolazone as well as Aldactone. In addition, she is receiving high-protein tube feeds which is known to increased BUN. She also had significant hemoptysis so is likely digesting some of the blood related to that and has had progressive anemia although not any FOBT test positive that is likely contributing as well.  First thing I would like to do is not give her any more diuretics for a CVP of 6- I'm going to gently give her back a little bit of fluid at 50 mL per hour. I will ask to change her tube feeds over to Nepro, there will likely not be anything that we can do about her digesting hemoglobin.  Her baseline creatinine is probably in the 0.8 range because I doubt that she has much muscle mass. Therefore, she could she has developed a little bit of renal insufficiency as well and that is contributing.  2. Hypertension/volume  -  as above don't believe she is wet. We'll give her back some fluid 3. Anemia  - supportive care 4. Dispo- as an aside patient would not be a candidate for chronic dialysis therapy due to her bedbound and chronically ill status 5. Pyuria- we'll send urine for culture but also is already on aztreonam and vancomycin   , A 11/18/2015, 2:25 PM

## 2015-11-18 NOTE — Progress Notes (Signed)
Pt asynchronous with vent. Pt trying to pull at tube. Pt HR increased, O2 sats dropped slightly. Pt in distress. Pt safety mitts off again. Three nurses had to keep pt hands away from tube.  Daughter arrived in room and very upset about the perceived way we were holding her hands and suctioning ET tube. Nurse trying to start continuous sedation since PRN meds given throughout night weren't working. Daughter states "I don't like the way you treat my mom". Daughter yelling and making accusations of mistreatment. Charge nurse notified. Nurse educated daughter on need for continuous sedation and why pulling at the tube was unsafe for her mom and why she needed the mitts on. Daughter calmed down. Daughter left room. Pt now stable with continuous sedation. Will continue to monitor closely.

## 2015-11-18 NOTE — Progress Notes (Signed)
PULMONARY / CRITICAL CARE MEDICINE   Name: [name] MRN: [id number] DOB: 04/13/1931    ADMISSION DATE:  11/07/2015 CONSULTATION DATE:  11/08/15 LOS 11 das  REFERRING MD: Lars Masson, A  CHIEF COMPLAINT:  Altered mental status, hypercarbic resp failure.    CULTURES: 11/17/15 - resp culture -    ANTIBIOTICS: Vanc 02/14 >> 2/16, 2/24 >> Azactam 02/14 >> 2/21, 2/24 >>    LINES/TUBES: ETT 02/14>>> L IJ TLC 2/14>>  SIGNIFICANT EVENTS: CT head 2/13. Chronic age-related changes, old infarcts. No acute abnormality. MRI head 2/13. No acute intracranial process ................................ 2/14  Admit with altered mental status, hypercarbic, failed bipap / intubated - Chest x-ray 2/14 bilateral pleural effusions, basilar opacities 2/17  Remains on lasix gtt, precedex  2/19 :  No acute events overnight.  Pt more alert/awake this am.  Follows commands (when demonstrated first)   11/14/15- pn prn fentanyl. Just started sbt . Daughter at bedside. RN reports patient exhausted and edematous and sometimes agitated . Restart scheduled lasix    11/15/15 - ET tube too low. Got 1 unit prbc for hg < 7gm%. Stool OB x 1 negative. Failed SBT. Lasix held by cards due o cxr improvement and cVP 5. Improving creat but worsening BUN to 90s. Echo pending.  Daughte rreports patien bed bound and in diapers since 2009 but normal mental status . Mild dysphagia at baseline for hard solids +  11/16/15 - EF 35%. CVP up again and cards gave 1 dose lasix. BUN still high 80s. CXr stil congested. Doing SBT x 3h off precedex. Normal WUA per RN. Looks exhausted . FOBT x 2 ngative . DPOA indicating full code + full med care  11/17/15 - massive hemoptysis post extubation - new finding with emergent bronch and intubation and s/p l;eft ches ttube for pneumothorax. This AM -> on prn sedation. Not on pressors. BUN continues to rise. No hemoptysis this AM   SUBJECTIVE/OVERNIGHT/INTERVAL HX 11/18/15 - Pall care  meeting planned for 11/20/15. On chart reviw daughter Crissie Reese showing signs of spiritical distress - yelling at RN and feeling mom not being taken care of well. GBM an RF negative yesterd. BUn/creat rising. Mild intermittent hemoptysis + via ETT tube. Daughter Angelyn Punt DPOA at bedside. Had me update patient daughter in law Brien Mates RN over speaker phone. 40% fio3  Yesterday - daughter Angelyn Punt -> indicated no tracheostomy to PCCM MD. And per RN Archie Patten -> other daughter Crissie Reese also indicated no tracheostomy but at same tme they are indicating full code and full medical care  VITAL SIGNS: BP 130/61 mmHg  Pulse 77  Temp(Src) 98.7 F (37.1 C) (Oral)  Resp 17  Ht 5\' 2"  (1.575 m)  Wt 85.9 kg (189 lb 6 oz)  BMI 34.63 kg/m2  SpO2 100%  HEMODYNAMICS: CVP:  [6 mmHg-12 mmHg] 6 mmHg  VENTILATOR SETTINGS: Vent Mode:  [-] PRVC FiO2 (%):  [40 %-60 %] 40 % Set Rate:  [16 bmp] 16 bmp Vt Set:  [400 mL] 400 mL PEEP:  [5 cmH20] 5 cmH20 Plateau Pressure:  [20 cmH20-27 cmH20] 20 cmH20  INTAKE / OUTPUT: I/O last 3 completed shifts: In: 815.9 [I.V.:445.9; NG/GT:370] Out: 1830 [Urine:1480; Chest Tube:350]  PHYSICAL EXAMINATION: General: Chronically ill appearing elderly female, on vent. LOOKS DECONDITIONED.  Neuro:  RASS -3 on diprivan + prn sedation HEENT: [place]/AT, PERRL, EOM-I and MMM. Cardiovascular:  RRR, Nl S1/S2, -M/R/G. Lungs:  Even/non-labored on vent, lungs bilaterally diminished  Abdomen: Soft, NT, ND and +BS. Ext: 1+ edema  and -tenderness. Skin: Intact.   LABS:  PULMONARY  Recent Labs Lab 11/12/15 0344  11/15/15 0710 11/16/15 0435 11/17/15 0145 11/17/15 0400 11/18/15 0400  PHART 7.368  --   --   --  7.445  --   --   PCO2ART 35.8  --   --   --  46.8*  --   --   PO2ART 74.7*  --   --   --  194.0*  --   --   HCO3 21.5  --   --   --  32.1*  --   --   TCO2 22.8  --   --   --  34  --   --   O2SAT 96.5  < > 70.5 70.4 100.0 62.3 83.3  < > = values in this interval not  displayed.  CBC  Recent Labs Lab 11/16/15 0446 11/17/15 0035 11/17/15 0345 11/18/15 0422  HGB 8.1* 8.5* 8.4* 7.1*  HCT 26.2* 28.6* 27.8* 23.3*  WBC 5.6  --  10.3 6.5  PLT 121*  --  147* 158    COAGULATION  Recent Labs Lab 11/17/15 0035  INR 1.18    CARDIAC    Recent Labs Lab 11/17/15 0345 11/17/15 0931 11/17/15 1800  TROPONINI 1.74* 2.00* 1.66*   No results for input(s): PROBNP in the last 168 hours.   CHEMISTRY  Recent Labs Lab 11/12/15 0340  11/14/15 1842 11/15/15 0435 11/16/15 0446 11/17/15 0035 11/17/15 0345 11/18/15 0422  NA 134*  < > 132* 136 137 143 143 142  K 3.6  < > 3.3* 3.6 3.0* 4.0 3.8 3.1*  CL 94*  < > 90* 92* 96* 100* 99* 101  CO2 26  < > '29 30 30 29 30 28  '$ GLUCOSE 177*  < > 236* 181* 146* 164* 135* 132*  BUN 59*  < > 87* 93* 99* 100* 101* 100*  CREATININE 1.23*  < > 1.07* 1.06* 0.93 0.95 1.00 1.03*  CALCIUM 8.0*  < > 8.1* 8.2* 8.5* 8.6* 8.6* 8.8*  MG 2.3  --  2.0 2.2 2.4  --  2.4 2.3  PHOS 4.2  --   --  4.7* 4.3  --  5.1* 4.4  < > = values in this interval not displayed. Estimated Creatinine Clearance: 42.8 mL/min (by C-G formula based on Cr of 1.03).   LIVER  Recent Labs Lab 11/17/15 0035  INR 1.18     INFECTIOUS No results for input(s): LATICACIDVEN, PROCALCITON in the last 168 hours.   ENDOCRINE CBG (last 3)   Recent Labs  11/17/15 2324 11/18/15 0355 11/18/15 0820  GLUCAP 121* 112* 112*         IMAGING x48h  - image(s) personally visualized  -   highlighted in bold Ct Chest Wo Contrast  11/17/2015  CLINICAL DATA:  Major hemoptysis.  Inpatient. EXAM: CT CHEST WITHOUT CONTRAST TECHNIQUE: Multidetector CT imaging of the chest was performed following the standard protocol without IV contrast. COMPARISON:  10/19/2015 chest CT. Chest radiograph from earlier today. FINDINGS: Mediastinum/Nodes: There is new scattered pneumomediastinum throughout the mediastinum including anterior to the left brachiocephalic vein  and ascending aorta and surrounding the mid to lower thoracic esophagus. No mediastinal hematoma or fluid collection. Stable mild cardiomegaly. No pericardial fluid/thickening. Extensive coronary atherosclerosis status post CABG with left internal mammary and ascending aortic bypass grafts. Left internal jugular central venous catheter terminates in the lower third of the superior vena cava. Great vessels are normal in course and caliber. Stable  hypodense 2.3 cm right thyroid lobe nodule. Normal esophagus. No pathologically enlarged axillary, mediastinal or gross hilar lymph nodes, noting limited sensitivity for the detection of hilar adenopathy on this noncontrast study. Lungs/Pleura: Left lateral approach chest tube enters the pleural space between the lateral left sixth and seventh ribs and terminates in the anterior basilar left pleural space. Tiny less than 5% apical left pneumothorax. No right pneumothorax. No residual left pleural effusion. Small layering right pleural effusion, mildly increased since 10/19/2015. Endotracheal tube tip is 2.1 cm above the carina. There is extensive consolidation, patchy ground-glass opacity and volume loss in the right lower lobe, increased since 10/19/2015. There is new complete right middle lobe atelectasis with occlusion of the right middle lobe bronchus by relatively low-density material suspected to represent mucous plugging. Bilateral upper lobe parenchymal bands are stable, in keeping with subsegmental scarring versus atelectasis. Patchy consolidation, ground-glass opacity and volume loss in the dependent left lower lobe, increased. Hazy ground-glass opacity throughout the remaining aerated lungs. Upper abdomen: Enteric tube terminates in the distal body of the stomach. Granulomatous calcifications in the liver. Stable hypodense 0.8 cm lesion in the posterior right liver lobe, too small to characterize. Musculoskeletal: No aggressive appearing focal osseous lesions.  Mild-to-moderate degenerative changes in the thoracic spine. Sternotomy wires appear aligned and intact. IMPRESSION: 1. Well-positioned endotracheal and enteric tubes. Well-positioned left internal jugular central venous catheter. 2. Tiny left apical pneumothorax. Basilar left chest tube in place. No residual left pleural effusion. 3. New pneumomediastinum, nonspecific, suspect barotrauma. 4. Extensive consolidation, patchy ground-glass opacity and volume loss in the right greater than left lower lobes, increased bilaterally. Findings are nonspecific and likely represent a combination of atelectasis, pneumonia and/or alveolar hemorrhage. 5. New complete right middle lobe atelectasis with occlusion of the right middle lobe bronchus probably by mucous plugs. 6. Cardiomegaly and increased small right pleural effusion. Hazy ground-glass opacity throughout the remaining aerated lungs could represent edema or diffuse alveolar hemorrhage. Electronically Signed   By: Ilona Sorrel M.D.   On: 11/17/2015 16:11   Dg Chest Port 1 View  11/18/2015  CLINICAL DATA:  Intubation. EXAM: PORTABLE CHEST 1 VIEW COMPARISON:  CT 11/17/2015. FINDINGS: Endotracheal tube, NG tube, left IJ line, left chest tube in stable position. No pneumothorax. Pneumomediastinum best identified by prior CT of 11/17/2015. Prior CABG. Stable cardiomegaly . Improving aeration both lungs with persistent mild atelectasis and/or infiltrate right lung base. Small persistent right pleural effusion. IMPRESSION: 1. Lines and tubes including left chest tube in stable position. No pneumothorax. Pneumomediastinum is best identified on prior CT of 11/17/2015. No evidence of progression. 2. Interim improvement of pulmonary aeration bilaterally. Mild residual right lower lobe atelectasis and/or infiltrate. Small residual right pleural effusion . 3. Prior CABG.  Stable cardiomegaly. Electronically Signed   By: Marcello Moores  Register   On: 11/18/2015 08:05   Dg Chest Port 1  View  11/17/2015  CLINICAL DATA:  Status post left-sided chest tube placement. Assess for residual pneumothorax. Initial encounter. EXAM: PORTABLE CHEST 1 VIEW COMPARISON:  Chest radiograph performed earlier today at 12:12 a.m. FINDINGS: The patient's endotracheal tube is seen ending 2-3 cm above the carina. The enteric tube is noted ending overlying the antrum of the stomach. A left IJ line is noted ending about the mid to distal SVC. A left-sided chest tube is noted.  No residual pneumothorax is seen. The lungs are hypoexpanded. A small right pleural effusion is noted. Right basilar and left perihilar airspace opacity raise concern for pneumonia. No  pneumothorax is seen. The cardiomediastinal silhouette is mildly enlarged. The patient is status post median sternotomy, with evidence of prior CABG. No acute osseous abnormalities are seen. IMPRESSION: 1. Status post left-sided chest tube placement. No residual pneumothorax seen. 2. Endotracheal tube seen ending 2-3 cm above the carina. 3. Lungs hypoexpanded, with small right pleural effusion. Right basilar and left perihilar airspace opacities raise concern for pneumonia. 4. Mild cardiomegaly. Electronically Signed   By: Garald Balding M.D.   On: 11/17/2015 03:46   Portable Chest Xray  11/17/2015  CLINICAL DATA:  Respiratory failure.  Re- intubated. EXAM: PORTABLE CHEST 1 VIEW COMPARISON:  Earlier this day at 1314 hour FINDINGS: Endotracheal tube is 4.6 cm from the carina. Enteric tube is in place, tip below the diaphragm. Left central line tip in the mid SVC. Small apical lateral left pneumothorax, approximately 10%. No mediastinal shift. Suggestion of pneumomediastinum or pneumopericardium outlining the inferior heart border. Lucency about the left heart border is well. Decreasing pleural effusions and bibasilar opacities. Worsening interstitial prominence on the right. Cardiomegaly again seen. IMPRESSION: 1. Endotracheal tube 4.6 cm from the carina. 2. New  small left pneumothorax. Air outlining the inferior heart border, pneumomediastinum versus pneumopericardium. Abnormal lucency about the left heart border, secondary to extrapleural versus mediastinal air. 3. Improving bilateral pleural effusions. Increasing interstitial opacities in the right perihilar lung, may be asymmetric pulmonary edema. Critical Value/emergent results were called by telephone at the time of interpretation on 11/17/2015 at 1:18 am to Dr. Concepcion Living, who verbally acknowledged these results. Electronically Signed   By: Jeb Levering M.D.   On: 11/17/2015 01:20   Dg Chest Port 1 View  11/16/2015  CLINICAL DATA:  Hemoptysis. EXAM: PORTABLE CHEST 1 VIEW COMPARISON:  11/16/2015. FINDINGS: The endotracheal and nasogastric tubes have been removed. Stable left jugular catheter. Stable enlarged cardiac silhouette and post CABG changes. Mildly increased bibasilar airspace opacity. Small bilateral pleural effusions. Stable prominence of the pulmonary vasculature and interstitial markings. Increased linear density in the left mid lung zone. Diffuse osteopenia. IMPRESSION: 1. Increased bibasilar pneumonia or atelectasis. 2. Small bilateral pleural effusions. 3. Otherwise, stable cardiomegaly and changes of congestive heart failure. 4. Increased atelectasis though the left mid lung zone. Electronically Signed   By: Claudie Revering M.D.   On: 11/16/2015 13:24   Dg Abd Portable 1v  11/17/2015  CLINICAL DATA:  Enteric tube placement.  Initial encounter. EXAM: PORTABLE ABDOMEN - 1 VIEW COMPARISON:  CT of the abdomen and pelvis performed 10/13/2015 FINDINGS: The patient's enteric tube is noted ending overlying the body of the stomach. The stomach is relatively decompressed. There is diffuse distention of small and large bowel loops, concerning for mild ileus. No free intra-abdominal air is seen, though evaluation for free air is limited given the patient's habitus and on a single supine view. No acute osseous  abnormalities are seen. An external pacing pad is noted. The patient is status post median sternotomy. IMPRESSION: 1. Enteric tube noted ending overlying the body of the stomach. 2. Diffuse distention of small and large bowel loops is concerning for mild ileus. Electronically Signed   By: Garald Balding M.D.   On: 11/17/2015 01:03      ASSESSMENT / PLAN:  PULMONARY A: Hypercarbic respiratory failure - in setting of CHF, effusions and LLL PNA,  - 2/14 - failed bipap and intubated  - 11/16/15 - failed extubation with onset of hemoptysis and left ptx and reintubated 2/23. CLot sent to path dept   - 11/18/15 ->  ongoing mild hemoptysis. CT chest with RLL process + RML process. Does not met sbt criteria due to agitation and deconditioning. 40% fi2,peep 5. Daughter in law Amal concerned about PE  P:   Full vent suppot Albuterol BID  Duoneb Q4 PRN  Await pathology of clot 10/28/15 - d/w path they will call us with result Await trach aspirate culture 11/17/15 Await autoimme and vasculitis workup 11/17/15 (GBM negative) Duplex LE rule out DVT (Not a good candidate for CTA) Flexible bronch depending on course  CARDIOVASCULAR A:  Coronary artery disease. S/p multiple stents and CABG. Hx HTN, CHF   - siginficant 3rd spacing + , Systolic CHF acute on chronic - s/p repeat lasix x 1  11/18/15  P:  per card - lasix prn + coreg + hold off ace inhibitor + continue aldactone  RENAL A:   #baseline   - bun 20/creat 1  #currently AKI.   bun worsening. FOBT x 2 negative this admit and neg x 1 in dec 2016  - bun worsening appears unrelatd to lasix  P:   REnal consult Correct electrolytes as indicated Follow BMP / UOP  Await\ Autoimmune and vasculitis panel 11/17/15 -  Renal consult depeding on above  GASTROINTESTINAL A:   GI prophylaxis. Nutrition. Ammonia elevated, likely hepatic congestion. Constipation    -  tF P:   SUP: pantoprazole. TF NPO post exutbation Will need swallow  eval. Senakot 2 tablets BID. Miralax once a day.   HEMATOLOGIC  Recent Labs Lab 11/16/15 0446 11/17/15 0035 11/17/15 0345 11/18/15 0422  HGB 8.1* 8.5* 8.4* 7.1*  HCT 26.2* 28.6* 27.8* 23.3*  WBC 5.6  --  10.3 6.5  PLT 121*  --  147* 158    A:   Anemia. Thrombocytopenia. VTE prophylaxis.   - got 1 unit PRBc - FOBT x 2 negative  P:  Ferrous sulfate since 11/16/15 per family request Transfuse for Hgb < 7. Monitor platelet counts. SCD's. Monitor CBC   INFECTIOUS A:   Recent UTI and LLL PNA. S/p azactam ending 11/15/15   - no fever 11/18/15 but recurrent hemoptysis raises question for VAP P:   Cultures as above  Restart vanc and aztreonam  ENDOCRINE A:   Diabetes mellitus. P:   SSI.  NEUROLOGIC A:   #Baseline   - old strokes . Bed bound x 2009 but normal conversation. In diapers  #Now Acute hypercarbic + hepatic encephalopathy - failed BiPAP and required intubation PM of 02/14 and 11/17/15   - apppears very deconditioned and has agitation on WUA. On diprivan 11/18/15  P Diprivan gtt  - chek CK, lactate Prn sedation RASS goal: 0 to -1. Daily WUA. Continue Primidone for parkinson (once daily for Estimated Creatinine Clearance: 42.8 mL/min (by C-G formula based on Cr of 1.03).) - if agitation continues might need neuro input on this drug   FAMILY  - Updates: No family available am 2/19.  Daughter at bedside updated.DPOA Raja updated on phone 11/14/15  - Inter-disciplinary family meet or Palliative Care meeting due by:  2/21. - done with dpoa Raja and RN Joaquim Lai at bedside 11/15/15 - daughter body language is of full code + full medical care. Discussed IDT again with RN Joaquim Lai -> DPOA says "this is my mom and you have to do everything". Reintubation ok.  Dont again 11/17/15 with RN Kenney Houseman and DPOA [name]  -> explained heading into chronic critical lillnesss and implications of trach, ltac and uncertainties v comfort approach. Daughter is not sure.  Feels LTAC approach  is not helpful but does not want to give up on mom. Will need collective family input as well. She has agreed to meet pall care    - IDT meet again with Delmer Islam with RN Kenney Houseman and daughter in law of patient Mekhia Brogan an RN  Who was on speaker phone - long conversation covering all of above issues. Amal got upset that I was talking to her like a doctor would to a layman . I apologized and explained though I knew she was RN bcause it was on requested speaker phone I had to balance language so that DPOA Raja could also understand. Owens Loffler was very concerned that we were not doing anything and proposed discussion with palliative care about trach/ltach was being done prematurely and interim CCM was not doing anything for patient. I explained that this only about a conversation due to high pretest prob for indication of trach/ltac and explained all above interventions being done.    GLOBAL Full code. Await autoimmune. Called Dr Clover Mealy of renal. Updated Burtis Junes of palliative. Await biopsy  - d/w path. Get duplex leg. Restart abx   The patient is critically ill with multiple organ systems failure and requires high complexity decision making for assessment and support, frequent evaluation and titration of therapies, application of advanced monitoring technologies and extensive interpretation of multiple databases.   Critical Care Time devoted to patient care services described in this note is  45  Minutes. This time reflects time of care of this signee Dr Brand Males. This critical care time does not reflect procedure time, or teaching time or supervisory time of PA/NP/Med student/Med Resident etc but could involve care discussion time    Dr. Brand Males, M.D., [facility].C.P Pulmonary and Critical Care Medicine Staff Physician [facility] Pager: [phone], If no answer or between  15:00h - 7:00h: call [phone]  11/18/2015 10:46 AM

## 2015-11-18 NOTE — Progress Notes (Signed)
*  PRELIMINARY RESULTS* Vascular Ultrasound Lower extremity venous duplex has been completed.  Preliminary findings: No evidence of DVT in visualized veins or baker's cyst.   Farrel Demark, RDMS, RVT  11/18/2015, 12:51 PM

## 2015-11-18 NOTE — Progress Notes (Addendum)
Patient ID: [name], female   DOB: 29-Oct-1930, 82 y.o.   MRN: [id number]    Advanced Heart Failure Rounding Note   Subjective:    Extubated 2/22 but had acute hypoxic respiratory failure requiring emergent re-intubation.  Bronchoscopy 2/23 to remove obstructive mucus plug/clot, developed PTX and now has chest tube on right.   Clinically no change overnight, still awakens with stimulation.   CVP up to 12 today, co-ox 62% yesterday.   Got 1 unit PRBCs on 2/21.   Echo: EF 35-40% with regional WMAs.  Moderate MR with MV prolapse.   Objective:   Weight Range:  Vital Signs:   Temp:  [98.1 F (36.7 C)-99 F (37.2 C)] 98.1 F (36.7 C) (02/24 0400) Pulse Rate:  [61-102] 77 (02/24 0700) Resp:  [14-22] 18 (02/24 0700) BP: (101-163)/(33-70) 134/45 mmHg (02/24 0700) SpO2:  [100 %] 100 % (02/24 0700) FiO2 (%):  [40 %-60 %] 40 % (02/24 0400) Last BM Date: 11/17/15  Weight change: Filed Weights   11/14/15 0243 11/15/15 0434 11/16/15 0500  Weight: 185 lb 13.6 oz (84.3 kg) 186 lb 15.2 oz (84.8 kg) 189 lb 6 oz (85.9 kg)    Intake/Output:   Intake/Output Summary (Last 24 hours) at 11/18/15 0806 Last data filed at 11/18/15 0700  Gross per 24 hour  Intake 635.92 ml  Output    865 ml  Net -229.08 ml    Physical Exam: Intubated.  CVP 12  General: Intubated.  HEENT: normal Neck: supple. JVP 8-9. Carotids 2+ bilat; no bruits. No lymphadenopathy or thryomegaly appreciated. Cor: PMI nondisplaced. Regular rate & rhythm. No rubs, gallops or murmurs. Lungs: clear Abdomen: soft, nontender, nondistended. No hepatosplenomegaly. No bruits or masses. Good bowel sounds. Extremities: no cyanosis, clubbing, rash, R and LLE edema.  Neuro: Awakens with stimulation. Moves all 4 extremities w/o difficulty.   Telemetry: SR PVCs.    Labs: Basic Metabolic Panel:  Recent Labs Lab 11/12/15 0340  11/14/15 1842 11/15/15 0435 11/16/15 0446 11/17/15 0035 11/17/15 0345  11/18/15 0422  NA 134*  < > 132* 136 137 143 143 142  K 3.6  < > 3.3* 3.6 3.0* 4.0 3.8 3.1*  CL 94*  < > 90* 92* 96* 100* 99* 101  CO2 26  < > _0 GLUCOSE 177*  < > 236* 181* 146* 164* 135* 132*  BUN 59*  < > 87* 93* 99* 100* 101* 100*  CREATININE 1.23*  < > 1.07* 1.06* 0.93 0.95 1.00 1.03*  CALCIUM 8.0*  < > 8.1* 8.2* 8.5* 8.6* 8.6* 8.8*  MG 2.3  --  2.0 2.2 2.4  --  2.4 2.3  PHOS 4.2  --   --  4.7* 4.3  --  5.1* 4.4  < > = values in this interval not displayed.  Liver Function Tests: No results for input(s): AST, ALT, ALKPHOS, BILITOT, PROT, ALBUMIN in the last 168 hours. No results for input(s): LIPASE, AMYLASE in the last 168 hours. No results for input(s): AMMONIA in the last 168 hours.  CBC:  Recent Labs Lab 11/15/15 0435 11/15/15 1645 11/16/15 0446 11/17/15 0035 11/17/15 0345 11/18/15 0422  WBC 4.2 6.5 5.6  --  10.3 6.5  NEUTROABS  --   --  3.8  --  8.4* 4.8  HGB 6.6* 8.3* 8.1* 8.5* 8.4* 7.1*  HCT 21.8* 26.4* 26.2* 28.6* 27.8* 23.3*  MCV 87.9 89.5 87.6  --  88.5 88.6  PLT 118* 115* 121*  --  147* 158    Cardiac Enzymes:  Recent Labs Lab 11/17/15 0345 11/17/15 0931 11/17/15 1800  TROPONINI 1.74* 2.00* 1.66*    BNP: BNP (last 3 results)  Recent Labs  09/02/15 0715 11/08/15 0220  BNP 998.9* 2328.7*    ProBNP (last 3 results) No results for input(s): PROBNP in the last 8760 hours.    Other results:  Imaging: Ct Chest Wo Contrast  11/17/2015  CLINICAL DATA:  Major hemoptysis.  Inpatient. EXAM: CT CHEST WITHOUT CONTRAST TECHNIQUE: Multidetector CT imaging of the chest was performed following the standard protocol without IV contrast. COMPARISON:  10/19/2015 chest CT. Chest radiograph from earlier today. FINDINGS: Mediastinum/Nodes: There is new scattered pneumomediastinum throughout the mediastinum including anterior to the left brachiocephalic vein and ascending aorta and surrounding the mid to lower thoracic esophagus. No mediastinal  hematoma or fluid collection. Stable mild cardiomegaly. No pericardial fluid/thickening. Extensive coronary atherosclerosis status post CABG with left internal mammary and ascending aortic bypass grafts. Left internal jugular central venous catheter terminates in the lower third of the superior vena cava. Great vessels are normal in course and caliber. Stable hypodense 2.3 cm right thyroid lobe nodule. Normal esophagus. No pathologically enlarged axillary, mediastinal or gross hilar lymph nodes, noting limited sensitivity for the detection of hilar adenopathy on this noncontrast study. Lungs/Pleura: Left lateral approach chest tube enters the pleural space between the lateral left sixth and seventh ribs and terminates in the anterior basilar left pleural space. Tiny less than 5% apical left pneumothorax. No right pneumothorax. No residual left pleural effusion. Small layering right pleural effusion, mildly increased since 10/19/2015. Endotracheal tube tip is 2.1 cm above the carina. There is extensive consolidation, patchy ground-glass opacity and volume loss in the right lower lobe, increased since 10/19/2015. There is new complete right middle lobe atelectasis with occlusion of the right middle lobe bronchus by relatively low-density material suspected to represent mucous plugging. Bilateral upper lobe parenchymal bands are stable, in keeping with subsegmental scarring versus atelectasis. Patchy consolidation, ground-glass opacity and volume loss in the dependent left lower lobe, increased. Hazy ground-glass opacity throughout the remaining aerated lungs. Upper abdomen: Enteric tube terminates in the distal body of the stomach. Granulomatous calcifications in the liver. Stable hypodense 0.8 cm lesion in the posterior right liver lobe, too small to characterize. Musculoskeletal: No aggressive appearing focal osseous lesions. Mild-to-moderate degenerative changes in the thoracic spine. Sternotomy wires appear  aligned and intact. IMPRESSION: 1. Well-positioned endotracheal and enteric tubes. Well-positioned left internal jugular central venous catheter. 2. Tiny left apical pneumothorax. Basilar left chest tube in place. No residual left pleural effusion. 3. New pneumomediastinum, nonspecific, suspect barotrauma. 4. Extensive consolidation, patchy ground-glass opacity and volume loss in the right greater than left lower lobes, increased bilaterally. Findings are nonspecific and likely represent a combination of atelectasis, pneumonia and/or alveolar hemorrhage. 5. New complete right middle lobe atelectasis with occlusion of the right middle lobe bronchus probably by mucous plugs. 6. Cardiomegaly and increased small right pleural effusion. Hazy ground-glass opacity throughout the remaining aerated lungs could represent edema or diffuse alveolar hemorrhage. Electronically Signed   By: Ilona Sorrel M.D.   On: 11/17/2015 16:11   Dg Chest Port 1 View  11/17/2015  CLINICAL DATA:  Status post left-sided chest tube placement. Assess for residual pneumothorax. Initial encounter. EXAM: PORTABLE CHEST 1 VIEW COMPARISON:  Chest radiograph performed earlier today at 12:12 a.m. FINDINGS: The patient's endotracheal tube is seen ending 2-3 cm above the carina. The enteric tube is noted ending  overlying the antrum of the stomach. A left IJ line is noted ending about the mid to distal SVC. A left-sided chest tube is noted.  No residual pneumothorax is seen. The lungs are hypoexpanded. A small right pleural effusion is noted. Right basilar and left perihilar airspace opacity raise concern for pneumonia. No pneumothorax is seen. The cardiomediastinal silhouette is mildly enlarged. The patient is status post median sternotomy, with evidence of prior CABG. No acute osseous abnormalities are seen. IMPRESSION: 1. Status post left-sided chest tube placement. No residual pneumothorax seen. 2. Endotracheal tube seen ending 2-3 cm above the  carina. 3. Lungs hypoexpanded, with small right pleural effusion. Right basilar and left perihilar airspace opacities raise concern for pneumonia. 4. Mild cardiomegaly. Electronically Signed   By: Garald Balding M.D.   On: 11/17/2015 03:46   Portable Chest Xray  11/17/2015  CLINICAL DATA:  Respiratory failure.  Re- intubated. EXAM: PORTABLE CHEST 1 VIEW COMPARISON:  Earlier this day at 1314 hour FINDINGS: Endotracheal tube is 4.6 cm from the carina. Enteric tube is in place, tip below the diaphragm. Left central line tip in the mid SVC. Small apical lateral left pneumothorax, approximately 10%. No mediastinal shift. Suggestion of pneumomediastinum or pneumopericardium outlining the inferior heart border. Lucency about the left heart border is well. Decreasing pleural effusions and bibasilar opacities. Worsening interstitial prominence on the right. Cardiomegaly again seen. IMPRESSION: 1. Endotracheal tube 4.6 cm from the carina. 2. New small left pneumothorax. Air outlining the inferior heart border, pneumomediastinum versus pneumopericardium. Abnormal lucency about the left heart border, secondary to extrapleural versus mediastinal air. 3. Improving bilateral pleural effusions. Increasing interstitial opacities in the right perihilar lung, may be asymmetric pulmonary edema. Critical Value/emergent results were called by telephone at the time of interpretation on 11/17/2015 at 1:18 am to Dr. Concepcion Living, who verbally acknowledged these results. Electronically Signed   By: Jeb Levering M.D.   On: 11/17/2015 01:20   Dg Chest Port 1 View  11/16/2015  CLINICAL DATA:  Hemoptysis. EXAM: PORTABLE CHEST 1 VIEW COMPARISON:  11/16/2015. FINDINGS: The endotracheal and nasogastric tubes have been removed. Stable left jugular catheter. Stable enlarged cardiac silhouette and post CABG changes. Mildly increased bibasilar airspace opacity. Small bilateral pleural effusions. Stable prominence of the pulmonary vasculature and  interstitial markings. Increased linear density in the left mid lung zone. Diffuse osteopenia. IMPRESSION: 1. Increased bibasilar pneumonia or atelectasis. 2. Small bilateral pleural effusions. 3. Otherwise, stable cardiomegaly and changes of congestive heart failure. 4. Increased atelectasis though the left mid lung zone. Electronically Signed   By: Claudie Revering M.D.   On: 11/16/2015 13:24   Dg Abd Portable 1v  11/17/2015  CLINICAL DATA:  Enteric tube placement.  Initial encounter. EXAM: PORTABLE ABDOMEN - 1 VIEW COMPARISON:  CT of the abdomen and pelvis performed 10/13/2015 FINDINGS: The patient's enteric tube is noted ending overlying the body of the stomach. The stomach is relatively decompressed. There is diffuse distention of small and large bowel loops, concerning for mild ileus. No free intra-abdominal air is seen, though evaluation for free air is limited given the patient's habitus and on a single supine view. No acute osseous abnormalities are seen. An external pacing pad is noted. The patient is status post median sternotomy. IMPRESSION: 1. Enteric tube noted ending overlying the body of the stomach. 2. Diffuse distention of small and large bowel loops is concerning for mild ileus. Electronically Signed   By: Garald Balding M.D.   On: 11/17/2015 01:03  Medications:     Scheduled Medications: . albuterol  2.5 mg Nebulization BID  . antiseptic oral rinse  7 mL Mouth Rinse 10 times per day  . atorvastatin  40 mg Oral q1800  . carvedilol  3.125 mg Oral BID WC  . chlorhexidine gluconate  15 mL Mouth Rinse BID  . ferrous sulfate  300 mg Per Tube Daily  . furosemide  40 mg Intravenous Once  . insulin aspart  0-9 Units Subcutaneous Q4H  . pantoprazole (PROTONIX) IV  40 mg Intravenous Q24H  . polyethylene glycol  17 g Oral Daily  . potassium chloride  40 mEq Per Tube Q4H  . primidone  50 mg Oral QHS  . senna-docusate  2 tablet Oral BID  . sodium chloride flush  10-40 mL Intracatheter  Q12H  . sodium chloride flush  3 mL Intravenous Q12H  . spironolactone  25 mg Per Tube Daily    Infusions: . sodium chloride 10 mL/hr at 11/17/15 0700  . sodium chloride 10 mL/hr at 11/14/15 2150  . propofol (DIPRIVAN) infusion 50 mcg/kg/min (11/18/15 0700)    PRN Medications: sodium chloride, fentaNYL (SUBLIMAZE) injection, hydrALAZINE, ipratropium-albuterol, metoprolol, midazolam, ondansetron **OR** ondansetron (ZOFRAN) IV, sodium chloride flush   Assessment/Plan    1. Hypercarbic Respiratory Failure: Intubated 2/14. ?Baseline OSA. Also suspect PNA. Extubated 2/22 but re-intubated with respiratory distress.  Developed mucus/clot plugging 2/23 and had bronchoscopy, complicated by right PTX and now has chest tube.  2. Acute on chronic systolic CHF: Ischemic cardiomyopathy. Echo with stable EF compared to prior, 35-40%. Co-ox 62% on 2/23.  CVP trending up to 12 this morning, did not get Lasix yesterday.   - Lasix 40 mg IV x 1 today.  Replace K.   - Coreg 3.125 bid, hold off on ACEI for now with elevated BUN.  - Can continue spironolactone. 3. CAD S/P CABG: CABG in June 2000, with the LIMA to LAD, left radial artery to the OM, SVG to the PDA and SVG to the diagonal. LHC 2006 with PCI to SVG to PDA. Myoview 2013 which showed no ischemia or infarction.  EF stable on echo this admission.  - ASA held with significant anemia.   - Continue home statin.  4. Anemia: Hgb trending back down.  Got 1 unit PRBCs earlier in stay. FOBT negative, has history of chronic anemia followed by hematology. Transfuse hgb < 7.  5. Hypokalemia: Supplement K.  6. H/o CVA 7. ID: Recent UTI and PNA treated at [facility], she is on a course of antibiotics.  ?LLL PNA contributing to current respiratory failure.  8. L Pneumothorax: S/P L chest tube.  26. Family met with palliative care yesterday, want to remain full code.  From a cardiac standpoint, she is stable.  However, her pulmonary status is very poor.   She was unable to tolerate extubation, likely due to weakness/hypoventilation.  Suspect she has underlying PNA.  Now she has left PTX.  I think overall prognosis is poor and she is frail.  I discussed this with her daughter yesterday. Eventual movement towards comfort care would be appropriate.    We will see again on Monday, call with questions over weekend.   Length of Stay: [address]  11/18/2015, 8:06 AM

## 2015-11-18 NOTE — Progress Notes (Signed)
eLink Physician-Brief Progress Note Patient Name: [name] DOB: 10-21-1930 MRN: [id number]   Date of Service  11/18/2015  HPI/Events of Note  Hypokalemia  eICU Interventions  Potassium replaced     Intervention Category Intermediate Interventions: Electrolyte abnormality - evaluation and management  DETERDING,ELIZABETH 11/18/2015, 5:05 AM

## 2015-11-18 NOTE — Progress Notes (Signed)
Nutrition Follow-up  DOCUMENTATION CODES:   Obesity unspecified  INTERVENTION:   D/C Vital High Protein Nepro @ 5 ml/hr 60 ml Prostat TID  Provides: 816 kcal, 99 grams protein, and 87 ml H2O.  TF regimen and propofol at current rate providing 1497 total kcal/day (17 kcal/kg of actual weight)  NUTRITION DIAGNOSIS:   Inadequate oral intake related to inability to eat as evidenced by NPO status. Ongoing.   GOAL:   Provide needs based on ASPEN/SCCM guidelines Progressing.   MONITOR:   Vent status, I & O's, TF tolerance  REASON FOR ASSESSMENT:   Consult Enteral/tube feeding initiation and management  ASSESSMENT:   [place] female with hx of DM, stroke, HTN, CHF (EF 50%) was admitted with encephalopathy. Pt recently hospitalized at [facility] for abd pain and dx with UTI. Pt with respiratory failure in setting of CHF, effusions and LLL PNA who failed BiPAP, intubated 2/14.  Patient is currently intubated on ventilator support MV: 6.4 L/min Temp (24hrs), Avg:98.4 F (36.9 C), Min:98 F (36.7 C), Max:99 F (37.2 C)  Propofol: 25.8 ml/hr provides: 681 kcal per day from lipid  Medications reviewed and include: ferrous sulfate, miralax, senokot-s Labs reviewed: potassium low 3.1, phosphorus and magnesium are WNL BUN elevated 100 despite being off TF since 2/22 Nephrology asked to change TF to Nepro formula.   Diet Order:  Diet NPO time specified  Skin:  Wound (see comment) (stage II - 3 dime sized ulcers on sacrum)  Last BM:  2/23  Height:   Ht Readings from Last 1 Encounters:  11/08/15  (1.575 m)    Weight:   Wt Readings from Last 1 Encounters:  11/16/15 189 lb 6 oz (85.9 kg)    Ideal Body Weight:  50 kg  BMI:  Body mass index is 34.63 kg/(m^2).  Estimated Nutritional Needs:   Kcal:  161-0960  Protein:  > 100 grams  Fluid:  > 1.5 L/day  EDUCATION NEEDS:   No education needs identified at this time  Kendell Bane RD, LDN,  CNSC [phone] Pager [phone] After Hours Pager

## 2015-11-19 ENCOUNTER — Inpatient Hospital Stay (HOSPITAL_COMMUNITY): Payer: Medicare Other

## 2015-11-19 LAB — BASIC METABOLIC PANEL
Anion gap: 12 (ref 5–15)
BUN: 107 mg/dL — AB (ref 6–20)
CALCIUM: 8.6 mg/dL — AB (ref 8.9–10.3)
CHLORIDE: 107 mmol/L (ref 101–111)
CO2: 26 mmol/L (ref 22–32)
CREATININE: 0.99 mg/dL (ref 0.44–1.00)
GFR calc Af Amer: 60 mL/min — ABNORMAL LOW (ref >60)
GFR calc non Af Amer: 52 mL/min — ABNORMAL LOW (ref >60)
GLUCOSE: 152 mg/dL — AB (ref 65–99)
Potassium: 3.4 mmol/L — ABNORMAL LOW (ref 3.5–5.1)
Sodium: 145 mmol/L (ref 135–145)

## 2015-11-19 LAB — PHOSPHORUS: Phosphorus: 4.5 mg/dL (ref 2.5–4.6)

## 2015-11-19 LAB — CBC
HCT: 26.8 % — ABNORMAL LOW (ref 36.0–46.0)
HEMOGLOBIN: 8 g/dL — AB (ref 12.0–15.0)
MCH: 26.8 pg (ref 26.0–34.0)
MCHC: 29.9 g/dL — ABNORMAL LOW (ref 30.0–36.0)
MCV: 89.6 fL (ref 78.0–100.0)
Platelets: 137 10*3/uL — ABNORMAL LOW (ref 150–400)
RBC: 2.99 MIL/uL — ABNORMAL LOW (ref 3.87–5.11)
RDW: 20 % — AB (ref 11.5–15.5)
WBC: 7.7 10*3/uL (ref 4.0–10.5)

## 2015-11-19 LAB — PROTIME-INR
INR: 1.13 (ref 0.00–1.49)
Prothrombin Time: 14.7 seconds (ref 11.6–15.2)

## 2015-11-19 LAB — CK TOTAL AND CKMB (NOT AT ARMC)
CK TOTAL: 23 U/L — AB (ref 38–234)
CK, MB: 1.5 ng/mL (ref 0.5–5.0)
RELATIVE INDEX: INVALID (ref 0.0–2.5)

## 2015-11-19 LAB — HEPATIC FUNCTION PANEL
ALK PHOS: 82 U/L (ref 38–126)
ALT: 36 U/L (ref 14–54)
AST: 67 U/L — AB (ref 15–41)
Albumin: 2.1 g/dL — ABNORMAL LOW (ref 3.5–5.0)
BILIRUBIN DIRECT: 0.2 mg/dL (ref 0.1–0.5)
BILIRUBIN INDIRECT: 0.5 mg/dL (ref 0.3–0.9)
Total Bilirubin: 0.7 mg/dL (ref 0.3–1.2)
Total Protein: 5.2 g/dL — ABNORMAL LOW (ref 6.5–8.1)

## 2015-11-19 LAB — CBC WITH DIFFERENTIAL/PLATELET
BASOS PCT: 0 %
Basophils Absolute: 0 10*3/uL (ref 0.0–0.1)
Eosinophils Absolute: 0 10*3/uL (ref 0.0–0.7)
Eosinophils Relative: 1 %
HEMATOCRIT: 22.5 % — AB (ref 36.0–46.0)
HEMOGLOBIN: 6.9 g/dL — AB (ref 12.0–15.0)
LYMPHS ABS: 1.1 10*3/uL (ref 0.7–4.0)
Lymphocytes Relative: 20 %
MCH: 27.8 pg (ref 26.0–34.0)
MCHC: 30.7 g/dL (ref 30.0–36.0)
MCV: 90.7 fL (ref 78.0–100.0)
MONO ABS: 0.5 10*3/uL (ref 0.1–1.0)
MONOS PCT: 8 %
NEUTROS ABS: 4 10*3/uL (ref 1.7–7.7)
NEUTROS PCT: 71 %
Platelets: 148 10*3/uL — ABNORMAL LOW (ref 150–400)
RBC: 2.48 MIL/uL — ABNORMAL LOW (ref 3.87–5.11)
RDW: 20.4 % — AB (ref 11.5–15.5)
WBC: 5.6 10*3/uL (ref 4.0–10.5)

## 2015-11-19 LAB — GLUCOSE, CAPILLARY
GLUCOSE-CAPILLARY: 137 mg/dL — AB (ref 65–99)
GLUCOSE-CAPILLARY: 137 mg/dL — AB (ref 65–99)
Glucose-Capillary: 145 mg/dL — ABNORMAL HIGH (ref 65–99)
Glucose-Capillary: 125 mg/dL — ABNORMAL HIGH (ref 65–99)
Glucose-Capillary: 106 mg/dL — ABNORMAL HIGH (ref 65–99)

## 2015-11-19 LAB — PREPARE RBC (CROSSMATCH)

## 2015-11-19 LAB — MAGNESIUM: MAGNESIUM: 2.1 mg/dL (ref 1.7–2.4)

## 2015-11-19 LAB — LACTIC ACID, PLASMA: LACTIC ACID, VENOUS: 1.8 mmol/L (ref 0.5–2.0)

## 2015-11-19 LAB — PROCALCITONIN: Procalcitonin: 1.36 ng/mL

## 2015-11-19 LAB — TRIGLYCERIDES: Triglycerides: 207 mg/dL — ABNORMAL HIGH (ref <150)

## 2015-11-19 MED ORDER — ACETYLCYSTEINE 20 % IN SOLN
2.0000 mL | Freq: Four times a day (QID) | RESPIRATORY_TRACT | Status: DC
  Administered 2015-11-19 – 2015-11-20 (×3): via RESPIRATORY_TRACT
  Administered 2015-11-20 (×2): 2 mL via RESPIRATORY_TRACT
  Administered 2015-11-21: 03:00:00 via RESPIRATORY_TRACT
  Administered 2015-11-21 – 2015-11-22 (×6): 2 mL via RESPIRATORY_TRACT
  Filled 2015-11-19 (×13): qty 4

## 2015-11-19 MED ORDER — ACETYLCYSTEINE 20 % IN SOLN
2.0000 mL | Freq: Four times a day (QID) | RESPIRATORY_TRACT | Status: DC
  Filled 2015-11-19 (×2): qty 4

## 2015-11-19 MED ORDER — ATROPINE SULFATE 0.1 MG/ML IJ SOLN
INTRAMUSCULAR | Status: AC
  Filled 2015-11-19: qty 10

## 2015-11-19 MED ORDER — POTASSIUM CHLORIDE 20 MEQ/15ML (10%) PO SOLN
40.0000 meq | Freq: Once | ORAL | Status: AC
  Administered 2015-11-19: 40 meq
  Filled 2015-11-19: qty 30

## 2015-11-19 MED ORDER — ALBUTEROL SULFATE (2.5 MG/3ML) 0.083% IN NEBU
2.5000 mg | INHALATION_SOLUTION | Freq: Four times a day (QID) | RESPIRATORY_TRACT | Status: DC
Start: 2015-11-19 — End: 2015-11-25
  Administered 2015-11-19 – 2015-11-25 (×23): 2.5 mg via RESPIRATORY_TRACT
  Filled 2015-11-19 (×23): qty 3

## 2015-11-19 MED ORDER — SODIUM CHLORIDE 0.9 % IV SOLN: Freq: Once | INTRAVENOUS | Status: DC

## 2015-11-19 NOTE — Progress Notes (Signed)
eLink Physician-Brief Progress Note Patient Name: BOWEN GOYAL DOB: 1931-08-25 MRN: [id number]   Date of Service  11/19/2015  HPI/Events of Note  Airway pressures running high with mucus plugs per RT  eICU Interventions  rec check peak/plateau difference/ pCXR now and increase saba with mucomyst     Intervention Category Major Interventions: Respiratory failure - evaluation and management  Sandrea Hughs 11/19/2015, 4:15 PM

## 2015-11-19 NOTE — Progress Notes (Signed)
eLink Physician-Brief Progress Note Patient Name: [name] DOB: 1930-10-05 MRN: [id number]   Date of Service  11/19/2015  HPI/Events of Note  Hgb drop from 7.1 to 6.9  eICU Interventions  Plan: Transfuse 1 unit pRBC Post-transfusion CBC     Intervention Category Intermediate Interventions: Other:  Yechiel Erny 11/19/2015, 5:09 AM

## 2015-11-19 NOTE — Progress Notes (Addendum)
Patient's vent alarming and patient suctioned several times after lavage; bright red clots suctioned from patient's inline suction.  Oxygen saturation decreased to 68%, RR increased to 41 and work of breathing increased.  Patient's heart rate decreased to mid-50's and rhythm morphology changed on monitor. 12 lead EKG obtained, respiratory notified and E-Link MD notified and new orders received.  Will continue to monitor.  [place], Mitzi Hansen

## 2015-11-19 NOTE — Progress Notes (Signed)
PULMONARY / CRITICAL CARE MEDICINE   Name: [name] MRN: [id number] DOB: 1930/12/31    ADMISSION DATE:  11/07/2015 CONSULTATION DATE:  11/08/15 LOS 104 Justin Mend MD: Cyndia Diver, A  CHIEF COMPLAINT:  Altered mental status, hypercarbic resp failure.    CULTURES: 11/17/15 - resp culture -    ANTIBIOTICS: Vanc 02/14 >> 2/16, 2/24 >> Azactam 02/14 >> 2/21, 2/24 >>    LINES/TUBES: ETT 02/14>>> L IJ TLC 2/14>>  SIGNIFICANT EVENTS: CT head 2/13. Chronic age-related changes, old infarcts. No acute abnormality. MRI head 2/13. No acute intracranial process ................................ 2/14  Admit with altered mental status, hypercarbic, failed bipap / intubated - Chest x-ray 2/14 bilateral pleural effusions, basilar opacities 2/17  Remains on lasix gtt, precedex  2/19 :  No acute events overnight.  Pt more alert/awake this am.  Follows commands (when demonstrated first)   11/14/15- pn prn fentanyl. Just started sbt . Daughter at bedside. RN reports patient exhausted and edematous and sometimes agitated . Restart scheduled lasix    11/15/15 - ET tube too low. Got 1 unit prbc for hg < 7gm%. Stool OB x 1 negative. Failed SBT. Lasix held by cards due o cxr improvement and cVP 5. Improving creat but worsening BUN to 90s. Echo pending.  Daughte rreports patien bed bound and in diapers since 2009 but normal mental status . Mild dysphagia at baseline for hard solids +  11/16/15 - EF 35%. CVP up again and cards gave 1 dose lasix. BUN still high 80s. CXr stil congested. Doing SBT x 3h off precedex. Normal WUA per RN. Looks exhausted . FOBT x 2 ngative . DPOA indicating full code + full med care  11/17/15 - massive hemoptysis post extubation - new finding with emergent bronch and intubation and s/p l;eft ches ttube for pneumothorax. This AM -> on prn sedation. Not on pressors. BUN continues to rise. No hemoptysis this AM  11/18/15 - Pall care meeting planned for 11/20/15. On chart  reviw daughter [name] showing signs of spiritical distress - yelling at RN and feeling mom not being taken care of well. GBM an RF negative yesterd. BUn/creat rising. Mild intermittent hemoptysis + via ETT tube. Daughter [name] DPOA at bedside. Had me update patient daughter in law Monique Croft RN over speaker phone. 40% fio3  Yesterday - daughter [name] -> indicated no tracheostomy to PCCM MD. And per RN Kenney Houseman -> other daughter [name] also indicated no tracheostomy but at same tme they are indicating full code and full medical care   SUBJECTIVE/OVERNIGHT/INTERVAL HX 11/19/15 -  No further hemoptysis.  Got 1 unit prbc overnight.   Per RN  [name] - reports of overnigth intermittent agitation but currently RASS -3 on diprivan gtt. Per Daugther [name] (non dpoa)- patient was without agitation till 3am and is upset that RN reporting that there was agitation. [name] does NOT want RN [name] taking care of patient Unclear to MD if family member upset that patient is sedated or she got agitated. Not on pressors.   BUN continues to climb - renal follwing. Duplex LE - negative for DVT. Path from 11/17/15 - shows blood clot; no malignant cells.   VITAL SIGNS: BP 118/60 mmHg  Pulse 55  Temp(Src) 97.5 F (36.4 C) (Oral)  Resp 16  Ht '5\' 2"'$  (1.575 m)  Wt 85.9 kg (189 lb 6 oz)  BMI 34.63 kg/m2  SpO2 100%  HEMODYNAMICS: CVP:  [7 mmHg-11 mmHg] 9 mmHg  VENTILATOR SETTINGS: Vent Mode:  [-] PRVC  FiO2 (%):  [40 %] 40 % Set Rate:  [16 bmp] 16 bmp Vt Set:  [400 mL] 400 mL PEEP:  [5 cmH20] 5 cmH20 Plateau Pressure:  [18 cmH20-22 cmH20] 22 cmH20  INTAKE / OUTPUT: I/O last 3 completed shifts: In: 2480.5 [I.V.:1734.2; NG/GT:346.3; IV Piggyback:400] Out: 0712 [Urine:1520; Chest Tube:30]  PHYSICAL EXAMINATION: General: Chronically ill appearing elderly female, on vent. LOOKS DECONDITIONED and CRIICALLY ILL.  Neuro:  RASS -3 on diprivan + prn sedation HEENT: [place]/AT, PERRL, EOM-I and MMM. Cardiovascular:  RRR, Nl  S1/S2, -M/R/G. Lungs:  Even/non-labored on vent, lungs bilaterally diminished  Abdomen: Soft, NT, ND and +BS. Ext: 1+ edema and -tenderness. Skin: Intact.   LABS:  PULMONARY  Recent Labs Lab 11/15/15 0710 11/16/15 0435 11/17/15 0145 11/17/15 0400 11/18/15 0400  PHART  --   --  7.445  --   --   PCO2ART  --   --  46.8*  --   --   PO2ART  --   --  194.0*  --   --   HCO3  --   --  32.1*  --   --   TCO2  --   --  34  --   --   O2SAT 70.5 70.4 100.0 62.3 83.3    CBC  Recent Labs Lab 11/17/15 0345 11/18/15 0422 11/19/15 0415  HGB 8.4* 7.1* 6.9*  HCT 27.8* 23.3* 22.5*  WBC 10.3 6.5 5.6  PLT 147* 158 148*    COAGULATION  Recent Labs Lab 11/17/15 0035 11/19/15 0415  INR 1.18 1.13    CARDIAC    Recent Labs Lab 11/17/15 0345 11/17/15 0931 11/17/15 1800  TROPONINI 1.74* 2.00* 1.66*   No results for input(s): PROBNP in the last 168 hours.   CHEMISTRY  Recent Labs Lab 11/15/15 0435 11/16/15 0446 11/17/15 0035 11/17/15 0345 11/18/15 0422 11/19/15 0415  NA 136 137 143 143 142 145  K 3.6 3.0* 4.0 3.8 3.1* 3.4*  CL 92* 96* 100* 99* 101 107  CO2 '30 30 29 30 28 26  '$ GLUCOSE 181* 146* 164* 135* 132* 152*  BUN 93* 99* 100* 101* 100* 107*  CREATININE 1.06* 0.93 0.95 1.00 1.03* 0.99  CALCIUM 8.2* 8.5* 8.6* 8.6* 8.8* 8.6*  MG 2.2 2.4  --  2.4 2.3 2.1  PHOS 4.7* 4.3  --  5.1* 4.4 4.5   Estimated Creatinine Clearance: 44.5 mL/min (by C-G formula based on Cr of 0.99).   LIVER  Recent Labs Lab 11/17/15 0035 11/19/15 0415  AST  --  67*  ALT  --  36  ALKPHOS  --  82  BILITOT  --  0.7  PROT  --  5.2*  ALBUMIN  --  2.1*  INR 1.18 1.13     INFECTIOUS  Recent Labs Lab 11/18/15 1122 11/19/15 11/19/15 0415  LATICACIDVEN  --  1.8  --   PROCALCITON 1.73  --  1.36     ENDOCRINE CBG (last 3)   Recent Labs  11/18/15 2330 11/19/15 0351 11/19/15 0724  GLUCAP 131* 137* 145*         IMAGING x48h  - image(s) personally visualized  -    highlighted in bold Ct Chest Wo Contrast  11/17/2015  CLINICAL DATA:  Major hemoptysis.  Inpatient. EXAM: CT CHEST WITHOUT CONTRAST TECHNIQUE: Multidetector CT imaging of the chest was performed following the standard protocol without IV contrast. COMPARISON:  10/19/2015 chest CT. Chest radiograph from earlier today. FINDINGS: Mediastinum/Nodes: There is new scattered pneumomediastinum throughout the  mediastinum including anterior to the left brachiocephalic vein and ascending aorta and surrounding the mid to lower thoracic esophagus. No mediastinal hematoma or fluid collection. Stable mild cardiomegaly. No pericardial fluid/thickening. Extensive coronary atherosclerosis status post CABG with left internal mammary and ascending aortic bypass grafts. Left internal jugular central venous catheter terminates in the lower third of the superior vena cava. Great vessels are normal in course and caliber. Stable hypodense 2.3 cm right thyroid lobe nodule. Normal esophagus. No pathologically enlarged axillary, mediastinal or gross hilar lymph nodes, noting limited sensitivity for the detection of hilar adenopathy on this noncontrast study. Lungs/Pleura: Left lateral approach chest tube enters the pleural space between the lateral left sixth and seventh ribs and terminates in the anterior basilar left pleural space. Tiny less than 5% apical left pneumothorax. No right pneumothorax. No residual left pleural effusion. Small layering right pleural effusion, mildly increased since 10/19/2015. Endotracheal tube tip is 2.1 cm above the carina. There is extensive consolidation, patchy ground-glass opacity and volume loss in the right lower lobe, increased since 10/19/2015. There is new complete right middle lobe atelectasis with occlusion of the right middle lobe bronchus by relatively low-density material suspected to represent mucous plugging. Bilateral upper lobe parenchymal bands are stable, in keeping with subsegmental  scarring versus atelectasis. Patchy consolidation, ground-glass opacity and volume loss in the dependent left lower lobe, increased. Hazy ground-glass opacity throughout the remaining aerated lungs. Upper abdomen: Enteric tube terminates in the distal body of the stomach. Granulomatous calcifications in the liver. Stable hypodense 0.8 cm lesion in the posterior right liver lobe, too small to characterize. Musculoskeletal: No aggressive appearing focal osseous lesions. Mild-to-moderate degenerative changes in the thoracic spine. Sternotomy wires appear aligned and intact. IMPRESSION: 1. Well-positioned endotracheal and enteric tubes. Well-positioned left internal jugular central venous catheter. 2. Tiny left apical pneumothorax. Basilar left chest tube in place. No residual left pleural effusion. 3. New pneumomediastinum, nonspecific, suspect barotrauma. 4. Extensive consolidation, patchy ground-glass opacity and volume loss in the right greater than left lower lobes, increased bilaterally. Findings are nonspecific and likely represent a combination of atelectasis, pneumonia and/or alveolar hemorrhage. 5. New complete right middle lobe atelectasis with occlusion of the right middle lobe bronchus probably by mucous plugs. 6. Cardiomegaly and increased small right pleural effusion. Hazy ground-glass opacity throughout the remaining aerated lungs could represent edema or diffuse alveolar hemorrhage. Electronically Signed   By: Ilona Sorrel M.D.   On: 11/17/2015 16:11   Dg Chest Port 1 View  11/19/2015  CLINICAL DATA:  Evaluate ET tube. EXAM: PORTABLE CHEST 1 VIEW COMPARISON:  November 18, 2015 FINDINGS: The ETT is in stable position, terminating approximately 2 cm above the carina. The remainder of the support apparatus is stable. No pneumothorax. Cardiomegaly remains. No change in the cardiomediastinal silhouette. Mild opacity remains in the right base. No other changes. IMPRESSION: Appropriate placement of support  apparatus. Mild opacity remains in the right base. Electronically Signed   By: Dorise Bullion III M.D   On: 11/19/2015 07:08   Dg Chest Port 1 View  11/18/2015  CLINICAL DATA:  Intubation. EXAM: PORTABLE CHEST 1 VIEW COMPARISON:  CT 11/17/2015. FINDINGS: Endotracheal tube, NG tube, left IJ line, left chest tube in stable position. No pneumothorax. Pneumomediastinum best identified by prior CT of 11/17/2015. Prior CABG. Stable cardiomegaly . Improving aeration both lungs with persistent mild atelectasis and/or infiltrate right lung base. Small persistent right pleural effusion. IMPRESSION: 1. Lines and tubes including left chest tube in stable position. No  pneumothorax. Pneumomediastinum is best identified on prior CT of 11/17/2015. No evidence of progression. 2. Interim improvement of pulmonary aeration bilaterally. Mild residual right lower lobe atelectasis and/or infiltrate. Small residual right pleural effusion . 3. Prior CABG.  Stable cardiomegaly. Electronically Signed   By: Maisie Fus  Register   On: 11/18/2015 08:05      ASSESSMENT / PLAN:  PULMONARY A: Hypercarbic respiratory failure - in setting of CHF, effusions and LLL PNA,  - 2/14 - failed bipap and intubated  - 11/16/15 - failed extubation with onset of hemoptysis and left ptx and reintubated 2/23.  -  CLot sent to path dept. - neg for malignancy   - CT 2/23 - RML and RLL process. DUplex LE 2/24 - neg dvt  - Vasculitis and autoimmune negative 11/17/15 except ANA 1:80 and trace positive (non specific)   - 11/19/15 -> no further  hemoptysis. . Does not met sbt criteria due to agitation and deconditioning.   P:   Full vent suppot Albuterol BID  Duoneb Q4 PRN  Flexible bronch depending on course esp if hemoptysis recurs - can do at time of trach - if they opt for trach   CARDIOVASCULAR A:  Coronary artery disease. S/p multiple stents and CABG. Hx HTN, CHF   - siginficant 3rd spacing + , Systolic CHF acute on chronic - s/p repeat  lasix x 1  11/18/15  P:  per card and renal  RENAL A:   #baseline   - bun 20/creat 1  #currently AKI.   bun worsening. FOBT x 2 negative this admit and neg x 1 in dec 2016  - bun worsening ; autoimmune negative  P:   Per REnal consult   GASTROINTESTINAL A:   GI prophylaxis. Nutrition. Ammonia elevated, likely hepatic congestion. Constipation    -  tF - on low protein since 11/18/15 due to high bun. Having soft stools  P:   SUP: pantoprazole. TF - low protein Dc - Senakot 2 tablets BID and Miralax once a day.   HEMATOLOGIC  Recent Labs Lab 11/17/15 0345 11/18/15 0422 11/19/15 0415  HGB 8.4* 7.1* 6.9*  HCT 27.8* 23.3* 22.5*  WBC 10.3 6.5 5.6  PLT 147* 158 148*    A:   Anemia. Thrombocytopenia. VTE prophylaxis.   - got 1 unit PRBc - FOBT x 2 negative and repeat prbc 11/18/15  P:  Ferrous sulfate since 11/16/15 per family request Transfuse for Hgb < 7. Monitor platelet counts. SCD's. Monitor CBC   INFECTIOUS    A:   Recent UTI and LLL PNA. S/p azactam ending 11/15/15   - no fever 11/18/15. Restarted abx 11/18/15 due to  recurrent hemoptysis and no further hemoptysis  P:   Cultures as above   vanc and aztreonam 11/18/15 >  ENDOCRINE A:   Diabetes mellitus. P:   SSI.  NEUROLOGIC A:   #Baseline   - old strokes . Bed bound x 2009 but normal conversation. In diapers  #Now Acute hypercarbic + hepatic encephalopathy - failed BiPAP and required intubation PM of 02/14 and 11/17/15   - apppears very deconditioned and has agitation on WUA. On diprivan 11/18/15  P Diprivan gtt  (lactate normal, CK 23) Prn sedation RASS goal: 0 to -1. Daily WUA. Continue Primidone for parkinson (once daily for Estimated Creatinine Clearance: 44.5 mL/min (by C-G formula based on Cr of 0.99).) - if agitation continues might need neuro input on this drug   FAMILY  - Updates: No family available am  2/19.  Daughter at bedside updated.DPOA [name] updated on phone  11/14/15  - Inter-disciplinary family meet or Palliative Care meeting due by:  2/21. - done with dpoa [name] and RN Joaquim Lai at bedside 11/15/15 - daughter body language is of full code + full medical care. Discussed IDT again with RN Joaquim Lai -> DPOA says "this is my mom and you have to do everything". Reintubation ok.  Dont again 11/17/15 with RN Kenney Houseman and DPOA [name]  -> explained heading into chronic critical lillnesss and implications of trach, ltac and uncertainties v comfort approach. Daughter is not sure. Feels LTAC approach is not helpful but does not want to give up on mom. Will need collective family input as well. She has agreed to meet pall care    - IDT meet again with Delmer Islam with RN Kenney Houseman and daughter in law of patient [name] an RN  Who was on speaker phone - long conversation covering all of above issues. [name] got upset that I was talking to her like a doctor would to a layman . I apologized and explained though I knew she was RN bcause it was on requested speaker phone I had to balance language so that DPOA [name] could also understand. Owens Loffler was very concerned that we were not doing anything and proposed discussion with palliative care about trach/ltach was being done prematurely and interim CCM was not doing anything for patient. I explained that this only about a conversation due to high pretest prob for indication of trach/ltac and explained all above interventions being done.   - Updated daughter [name] at bedside 11/19/15 -. Pall care meeting 11/20/15 - to discuss options - patient heading to trach   GLOBAL Full code.    The patient is critically ill with multiple organ systems failure and requires high complexity decision making for assessment and support, frequent evaluation and titration of therapies, application of advanced monitoring technologies and extensive interpretation of multiple databases.   Critical Care Time devoted to patient care services described in this note is   35  Minutes. This time reflects time of care of this signee Dr Brand Males. This critical care time does not reflect procedure time, or teaching time or supervisory time of PA/NP/Med student/Med Resident etc but could involve care discussion time    Dr. Brand Males, M.D., [facility]C.P Pulmonary and Critical Care Medicine Staff Physician [facility] Pager: [phone], If no answer or between  15:00h - 7:00h: call [phone]  11/19/2015 10:47 AM

## 2015-11-19 NOTE — Progress Notes (Signed)
RT called to pt room due to peak pressure alarm sounding on vent. Lavaged and suctioned several times. Copious amounts of bloody mucus plugs were suctioned. Pt currently stable, and peak pressures lowered. RT will continue to monitor.

## 2015-11-19 NOTE — Progress Notes (Signed)
eLink Physician-Brief Progress Note Patient Name: [name] DOB: [birth year] MRN: [id number]   Date of Service  11/19/2015  HPI/Events of Note  Hypokalemia  eICU Interventions  Potassium replaced     Intervention Category Minor Interventions: Electrolytes abnormality - evaluation and management  DETERDING,ELIZABETH 11/19/2015, 5:32 AM

## 2015-11-19 NOTE — Progress Notes (Signed)
CRITICAL VALUE ALERT  Critical value received:  Hgb 6.9  Date of notification:  11/19/15  Time of notification:  0500  Critical value read back:Yes.    Nurse who received alert:  Hillery Jacks, RN  MD notified (1st page):  Pola Corn MD  Time of first page:  0500  New orders to transfuse 1 unit of pRBCs

## 2015-11-19 NOTE — Progress Notes (Signed)
Subjective:  No significant change- hgb down further- needed transfusion this AM- is a liter positive and TF changed to a lower protein- made 1000 of urine- BUN higher  Objective Vital signs in last 24 hours: Filed Vitals:   11/19/15 0400 11/19/15 0500 11/19/15 0600 11/19/15 0700  BP: 104/41 127/48 143/57 114/64  Pulse: 56 56 58 60  Temp: 98.1 F (36.7 C)   97.5 F (36.4 C)  TempSrc: Oral   Oral  Resp: Height:      Weight:      SpO2: 100% 100% 100% 100%   Weight change:   Intake/Output Summary (Last 24 hours) at 11/19/15 1610 Last data filed at 11/19/15 0600  Gross per 24 hour  Intake 2159.53 ml  Output   1030 ml  Net 1129.53 ml     Assessment/Plan: 82 year old white female with a fairly significant past medical history who is had now a 11 day hospitalization requiring ventilatory support. Other features of hospitalization include hemoptysis, need for high-protein tube feeds as well as apparent significant diuresis on multiple diuretic medications 1.Renal- BUN elevated out of proportion to creatinine. I think the reason for this is multifactorial. First of all,  significant diuresis since she has been here  on at any given time Lasix, Diamox, metolazone as well as Aldactone, she is receiving high-protein tube feeds, and she also had significant hemoptysis so is likely digesting some of the blood related to that with progressive anemia. No more diuretics for a CVP of 6- now 11 - try to keep her positive.  change her tube feeds over to Nepro, but  there will likely not be anything that we can do about her digesting hemoglobin. Her baseline creatinine is probably in the 0.8 range because I doubt that she has much muscle mass. Therefore, she could she has developed a little bit of renal insufficiency as well. This is going to resolve slowly and likely to worsen before starts to improve 2. Hypertension/volume - as above don't believe she is wet. Was a liter positive- gentle  fluids have been stopped 3. Anemia - supportive care 4. Dispo- as an aside patient would not be a candidate for chronic dialysis therapy due to her bedbound and chronically ill status 5. Pyuria- we'll send urine for culture but also is already on aztreonam and vancomycin   Tari Lecount A    Labs: Basic Metabolic Panel:  Recent Labs Lab 11/17/15 0345 11/18/15 0422 11/19/15 0415  NA 143 142 145  K 3.8 3.1* 3.4*  CL 99* 101 107  CO2 GLUCOSE 135* 132* 152*  BUN 101* 100* 107*  CREATININE 1.00 1.03* 0.99  CALCIUM 8.6* 8.8* 8.6*  PHOS 5.1* 4.4 4.5   Liver Function Tests:  Recent Labs Lab 11/19/15 0415  AST 67*  ALT 36  ALKPHOS 82  BILITOT 0.7  PROT 5.2*  ALBUMIN 2.1*   No results for input(s): LIPASE, AMYLASE in the last 168 hours. No results for input(s): AMMONIA in the last 168 hours. CBC:  Recent Labs Lab 11/15/15 1645  11/16/15 0446  11/17/15 0345 11/18/15 0422 11/19/15 0415  WBC 6.5  --  5.6  --  10.3 6.5 5.6  NEUTROABS  --   < > 3.8  --  8.4* 4.8 4.0  HGB 8.3*  --  8.1*  < > 8.4* 7.1* 6.9*  HCT 26.4*  --  26.2*  < > 27.8* 23.3* 22.5*  MCV 89.5  --  87.6  --  88.5 88.6 90.7  PLT 115*  --  121*  --  147* 158 148*  < > = values in this interval not displayed. Cardiac Enzymes:  Recent Labs Lab 11/17/15 0345 11/17/15 0931 11/17/15 1800 11/19/15 0415  CKTOTAL  --   --   --  23*  CKMB  --   --   --  1.5  TROPONINI 1.74* 2.00* 1.66*  --    CBG:  Recent Labs Lab 11/18/15 1133 11/18/15 1627 11/18/15 1944 11/18/15 2330 11/19/15 0351  GLUCAP 110* 131* 131* 131* 137*    Iron Studies: No results for input(s): IRON, TIBC, TRANSFERRIN, FERRITIN in the last 72 hours. Studies/Results: Ct Chest Wo Contrast  11/17/2015  CLINICAL DATA:  Major hemoptysis.  Inpatient. EXAM: CT CHEST WITHOUT CONTRAST TECHNIQUE: Multidetector CT imaging of the chest was performed following the standard protocol without IV contrast. COMPARISON:  10/19/2015  chest CT. Chest radiograph from earlier today. FINDINGS: Mediastinum/Nodes: There is new scattered pneumomediastinum throughout the mediastinum including anterior to the left brachiocephalic vein and ascending aorta and surrounding the mid to lower thoracic esophagus. No mediastinal hematoma or fluid collection. Stable mild cardiomegaly. No pericardial fluid/thickening. Extensive coronary atherosclerosis status post CABG with left internal mammary and ascending aortic bypass grafts. Left internal jugular central venous catheter terminates in the lower third of the superior vena cava. Great vessels are normal in course and caliber. Stable hypodense 2.3 cm right thyroid lobe nodule. Normal esophagus. No pathologically enlarged axillary, mediastinal or gross hilar lymph nodes, noting limited sensitivity for the detection of hilar adenopathy on this noncontrast study. Lungs/Pleura: Left lateral approach chest tube enters the pleural space between the lateral left sixth and seventh ribs and terminates in the anterior basilar left pleural space. Tiny less than 5% apical left pneumothorax. No right pneumothorax. No residual left pleural effusion. Small layering right pleural effusion, mildly increased since 10/19/2015. Endotracheal tube tip is 2.1 cm above the carina. There is extensive consolidation, patchy ground-glass opacity and volume loss in the right lower lobe, increased since 10/19/2015. There is new complete right middle lobe atelectasis with occlusion of the right middle lobe bronchus by relatively low-density material suspected to represent mucous plugging. Bilateral upper lobe parenchymal bands are stable, in keeping with subsegmental scarring versus atelectasis. Patchy consolidation, ground-glass opacity and volume loss in the dependent left lower lobe, increased. Hazy ground-glass opacity throughout the remaining aerated lungs. Upper abdomen: Enteric tube terminates in the distal body of the stomach.  Granulomatous calcifications in the liver. Stable hypodense 0.8 cm lesion in the posterior right liver lobe, too small to characterize. Musculoskeletal: No aggressive appearing focal osseous lesions. Mild-to-moderate degenerative changes in the thoracic spine. Sternotomy wires appear aligned and intact. IMPRESSION: 1. Well-positioned endotracheal and enteric tubes. Well-positioned left internal jugular central venous catheter. 2. Tiny left apical pneumothorax. Basilar left chest tube in place. No residual left pleural effusion. 3. New pneumomediastinum, nonspecific, suspect barotrauma. 4. Extensive consolidation, patchy ground-glass opacity and volume loss in the right greater than left lower lobes, increased bilaterally. Findings are nonspecific and likely represent a combination of atelectasis, pneumonia and/or alveolar hemorrhage. 5. New complete right middle lobe atelectasis with occlusion of the right middle lobe bronchus probably by mucous plugs. 6. Cardiomegaly and increased small right pleural effusion. Hazy ground-glass opacity throughout the remaining aerated lungs could represent edema or diffuse alveolar hemorrhage. Electronically Signed   By: Delbert Phenix M.D.   On: 11/17/2015 16:11   Dg Chest Port 1 View  11/19/2015  CLINICAL DATA:  Evaluate ET tube. EXAM: PORTABLE CHEST 1 VIEW COMPARISON:  November 18, 2015 FINDINGS: The ETT is in stable position, terminating approximately 2 cm above the carina. The remainder of the support apparatus is stable. No pneumothorax. Cardiomegaly remains. No change in the cardiomediastinal silhouette. Mild opacity remains in the right base. No other changes. IMPRESSION: Appropriate placement of support apparatus. Mild opacity remains in the right base. Electronically Signed   By: Gerome Sam III M.D   On: 11/19/2015 07:08   Dg Chest Port 1 View  11/18/2015  CLINICAL DATA:  Intubation. EXAM: PORTABLE CHEST 1 VIEW COMPARISON:  CT 11/17/2015. FINDINGS: Endotracheal  tube, NG tube, left IJ line, left chest tube in stable position. No pneumothorax. Pneumomediastinum best identified by prior CT of 11/17/2015. Prior CABG. Stable cardiomegaly . Improving aeration both lungs with persistent mild atelectasis and/or infiltrate right lung base. Small persistent right pleural effusion. IMPRESSION: 1. Lines and tubes including left chest tube in stable position. No pneumothorax. Pneumomediastinum is best identified on prior CT of 11/17/2015. No evidence of progression. 2. Interim improvement of pulmonary aeration bilaterally. Mild residual right lower lobe atelectasis and/or infiltrate. Small residual right pleural effusion . 3. Prior CABG.  Stable cardiomegaly. Electronically Signed   By: Maisie Fus  Register   On: 11/18/2015 08:05   Medications: Infusions: . sodium chloride 10 mL/hr at 11/18/15 0700  . sodium chloride 10 mL/hr at 11/14/15 2150  . sodium chloride 50 mL/hr at 11/18/15 1430  . propofol (DIPRIVAN) infusion 35 mcg/kg/min (11/19/15 0630)    Scheduled Medications: . sodium chloride   Intravenous Once  . albuterol  2.5 mg Nebulization BID  . antiseptic oral rinse  7 mL Mouth Rinse 10 times per day  . atorvastatin  40 mg Oral q1800  . aztreonam  1 g Intravenous 3 times per day  . carvedilol  3.125 mg Oral BID WC  . chlorhexidine gluconate  15 mL Mouth Rinse BID  . feeding supplement (NEPRO CARB STEADY)  1,000 mL Per Tube Q24H  . feeding supplement (PRO-STAT SUGAR FREE 64)  60 mL Per Tube TID  . ferrous sulfate  300 mg Per Tube Daily  . insulin aspart  0-9 Units Subcutaneous Q4H  . pantoprazole (PROTONIX) IV  40 mg Intravenous Q24H  . polyethylene glycol  17 g Oral Daily  . primidone  50 mg Oral QHS  . senna-docusate  2 tablet Oral BID  . sodium chloride flush  10-40 mL Intracatheter Q12H  . sodium chloride flush  3 mL Intravenous Q12H  . vancomycin  750 mg Intravenous Q12H    have reviewed scheduled and prn medications.  Physical Exam: General:  sedated on vent Heart: RRR Lungs: CBS bilat Abdomen: obese, soft, non tender Extremities: obese- possibly some dependent edema    11/19/2015,7:33 AM  LOS: 12 days

## 2015-11-20 ENCOUNTER — Inpatient Hospital Stay (HOSPITAL_COMMUNITY): Payer: Medicare Other

## 2015-11-20 DIAGNOSIS — Z431 Encounter for attention to gastrostomy: Secondary | ICD-10-CM

## 2015-11-20 DIAGNOSIS — Z7189 Other specified counseling: Secondary | ICD-10-CM

## 2015-11-20 DIAGNOSIS — Z515 Encounter for palliative care: Secondary | ICD-10-CM

## 2015-11-20 DIAGNOSIS — R042 Hemoptysis: Secondary | ICD-10-CM

## 2015-11-20 LAB — TYPE AND SCREEN
ABO/RH(D): O POS
ANTIBODY SCREEN: POSITIVE
DAT, IgG: NEGATIVE
DONOR AG TYPE: NEGATIVE
UNIT DIVISION: 0

## 2015-11-20 LAB — GLUCOSE, CAPILLARY
GLUCOSE-CAPILLARY: 117 mg/dL — AB (ref 65–99)
GLUCOSE-CAPILLARY: 176 mg/dL — AB (ref 65–99)
Glucose-Capillary: 123 mg/dL — ABNORMAL HIGH (ref 65–99)
Glucose-Capillary: 129 mg/dL — ABNORMAL HIGH (ref 65–99)
Glucose-Capillary: 135 mg/dL — ABNORMAL HIGH (ref 65–99)
Glucose-Capillary: 154 mg/dL — ABNORMAL HIGH (ref 65–99)

## 2015-11-20 LAB — BASIC METABOLIC PANEL
Anion gap: 10 (ref 5–15)
BUN: 99 mg/dL — AB (ref 6–20)
CALCIUM: 8.2 mg/dL — AB (ref 8.9–10.3)
CHLORIDE: 113 mmol/L — AB (ref 101–111)
CO2: 24 mmol/L (ref 22–32)
CREATININE: 1 mg/dL (ref 0.44–1.00)
GFR calc Af Amer: 59 mL/min — ABNORMAL LOW (ref >60)
GFR calc non Af Amer: 51 mL/min — ABNORMAL LOW (ref >60)
GLUCOSE: 146 mg/dL — AB (ref 65–99)
Potassium: 3.6 mmol/L (ref 3.5–5.1)
Sodium: 147 mmol/L — ABNORMAL HIGH (ref 135–145)

## 2015-11-20 LAB — CULTURE, RESPIRATORY W GRAM STAIN
Culture: NORMAL
Special Requests: NORMAL

## 2015-11-20 LAB — CBC WITH DIFFERENTIAL/PLATELET
BASOS PCT: 0 %
Basophils Absolute: 0 10*3/uL (ref 0.0–0.1)
Eosinophils Absolute: 0.1 10*3/uL (ref 0.0–0.7)
Eosinophils Relative: 1 %
HEMATOCRIT: 27.2 % — AB (ref 36.0–46.0)
Hemoglobin: 8.5 g/dL — ABNORMAL LOW (ref 12.0–15.0)
LYMPHS ABS: 1.3 10*3/uL (ref 0.7–4.0)
LYMPHS PCT: 18 %
MCH: 27.9 pg (ref 26.0–34.0)
MCHC: 31.3 g/dL (ref 30.0–36.0)
MCV: 89.2 fL (ref 78.0–100.0)
MONO ABS: 0.5 10*3/uL (ref 0.1–1.0)
MONOS PCT: 7 %
NEUTROS ABS: 5 10*3/uL (ref 1.7–7.7)
Neutrophils Relative %: 74 %
Platelets: 132 10*3/uL — ABNORMAL LOW (ref 150–400)
RBC: 3.05 MIL/uL — ABNORMAL LOW (ref 3.87–5.11)
RDW: 20.4 % — AB (ref 11.5–15.5)
WBC: 6.8 10*3/uL (ref 4.0–10.5)

## 2015-11-20 LAB — URINE CULTURE: Culture: >=100000

## 2015-11-20 LAB — MAGNESIUM: MAGNESIUM: 2.2 mg/dL (ref 1.7–2.4)

## 2015-11-20 LAB — TRIGLYCERIDES: Triglycerides: 177 mg/dL — ABNORMAL HIGH (ref <150)

## 2015-11-20 LAB — PROCALCITONIN: Procalcitonin: 0.89 ng/mL

## 2015-11-20 LAB — PHOSPHORUS: Phosphorus: 4 mg/dL (ref 2.5–4.6)

## 2015-11-20 MED ORDER — MIDAZOLAM HCL 2 MG/2ML IJ SOLN
1.0000 mg | INTRAMUSCULAR | Status: DC | PRN
  Administered 2015-11-21 – 2015-11-23 (×7): 2 mg via INTRAVENOUS
  Administered 2015-11-24: 4 mg via INTRAVENOUS
  Administered 2015-11-24 – 2015-11-25 (×6): 2 mg via INTRAVENOUS
  Filled 2015-11-20 (×5): qty 2
  Filled 2015-11-20: qty 4
  Filled 2015-11-20 (×7): qty 2

## 2015-11-20 MED ORDER — FENTANYL CITRATE (PF) 100 MCG/2ML IJ SOLN
50.0000 ug | INTRAMUSCULAR | Status: DC | PRN
  Administered 2015-11-21 (×2): 100 ug via INTRAVENOUS
  Administered 2015-11-21: 50 ug via INTRAVENOUS
  Administered 2015-11-21: 100 ug via INTRAVENOUS
  Administered 2015-11-22: 50 ug via INTRAVENOUS
  Administered 2015-11-22: 100 ug via INTRAVENOUS
  Administered 2015-11-22: 50 ug via INTRAVENOUS
  Administered 2015-11-22 – 2015-11-25 (×20): 100 ug via INTRAVENOUS
  Filled 2015-11-20 (×27): qty 2

## 2015-11-20 MED ORDER — LIDOCAINE HCL (PF) 1 % IJ SOLN
10.0000 mL | Freq: Once | INTRAMUSCULAR | Status: AC
  Administered 2015-11-20: 10 mL

## 2015-11-20 MED ORDER — LIDOCAINE HCL (PF) 1 % IJ SOLN
INTRAMUSCULAR | Status: AC
  Filled 2015-11-20: qty 10

## 2015-11-20 MED ORDER — FENTANYL CITRATE (PF) 100 MCG/2ML IJ SOLN
50.0000 ug | Freq: Once | INTRAMUSCULAR | Status: AC
  Administered 2015-11-20: 50 ug via INTRAVENOUS

## 2015-11-20 MED ORDER — METHYLPREDNISOLONE SODIUM SUCC 125 MG IJ SOLR
80.0000 mg | Freq: Four times a day (QID) | INTRAMUSCULAR | Status: DC
  Administered 2015-11-20 – 2015-11-24 (×16): 80 mg via INTRAVENOUS
  Filled 2015-11-20 (×16): qty 2

## 2015-11-20 NOTE — Progress Notes (Addendum)
Family was under impression (mistaken) that I was going to be at palliative meeting though I told both daugthers day before and yesterday that it was a palliative meet.. Palliative care team reports that they were upset that I was not there. I tried to go buyt 2-3 emergencies at [facility] long and I could not. I then called daughter in law Amal who is ICU-RN at [employer] and d/w her on phone. She has many concerns  1. Concern #1 -> concern about nursing care: Amal very concerned that nurses are not providing optimal care. Example":  She feels patient is not agitated and nurses are sedating her to have a quiet shift. Says patients restlessnes is basic personality. She is not in favor of diprivan gtt. She is ok with patient gets truly agitated on prn sedation and needs sedation gtt. She seems to favor precedex. PLAN: dc diprivan. Do fent and versed prn. IF worse true agitation then call eMD for precedex gtt    2. Concern 2 -> concerns about MD care: Because of above issue I asked her directly if she had concerns about MD care. She feels MD care is  in slight variance compared to her experience at [facility] but says that based on Dr Marchelle Gearing conversations with her and rationale she feels can accept decision making process so far and can respect it. However, she seemed more concerned . For example: feels steroids should have been board long time and patient should have been on ativan for agitation. PLAN: daily MD update to daughters who are decision makers + daughter in law Amal who is an Charity fundraiser at [employer]  3. Concern 3 -> communication - > daughter in law Kathlyn [place]  is fluent with English and is Charity fundraiser but says she cannot be present on daily basis and decisions are the daughters Angelyn Punt / [place] but agrees there is language barrier (eg: expectation by daughters that Dr Marchelle Gearing was going to be at meeting when explicity told that there was no plan). PLAN: ensure daily direct phone updates with Amal  3. She says that she wants care team  to do everything to get patient better bfore deciding on tracheostomy. She feels things are not being done in full. PLAN: full medical care   4. Hemoptysis - she is worried about PE. Also feels RN suctioning has made it worse. . Explained duplex LE x 1is negative. Bronch today shows left  lower lboe bleed intermittent and video bronch at time of trach would give better visual.  Autoimmune negative. Bleed remains unxplained. CT angio due to renal insuf can be risky due to high bUN 100 though creat 1.0 and making urine. PLAN: d/w radiology Dr Rayburn Go -> feels there is risk for ATN with CTA. VQ will be indeterminate and therfore tough to do.. I then d/w Dr Arlean Hopping  - feels moderate high risk for ATN with CTA.  IV heparin as Rx can be risky due to bleeding risk. - I explained this to Amal. She is ok with empiric steroids + SCDs + avoid active suctioning for now + repeat duplex LE   6. Encephalopathy - Amal does not think patient is confused and is able to follow commands. Says patient is a gentle woman. PLAN: prn sedation  7. Patient satisfaction - per Amal she overheard 5d ago she overheard charge nurse "bad mouthing" about patient daugthers. She says overall that patient daughters are satisfied with care except few circumstances. PLAN: continue to support   8.  Decision tracheostomy -  they want to hold off till mid-week to make . PLAN: readdress trach need mid-week    40-60 min time spent in talking to [place] and documenting from remote location.     Amal RN [phone] - avail M-F 8-5 and at other times for updates     Dr. Kalman Shan, M.D., [facility].C.P Pulmonary and Critical Care Medicine Staff Physician [facility] [facility] Pager: [phone], If no answer or between  15:00h - 7:00h: call [phone]  11/20/2015 2:34 PM

## 2015-11-20 NOTE — Progress Notes (Signed)
Daily Progress Note   Patient Name: [name]       Date: 11/20/2015 DOB: [birth year]  Age: 82 y.o. MRN#: [id number] Attending Physician: Kalman Shan, MD Primary Care Physician: [name], MD Admit Date: 11/07/2015  Reason for Consultation/Follow-up: Establishing goals of care and Psychosocial/spiritual support  Subjective:  - I meet family at bedside at scheduled time, 1200 noon.   They immediately are asking where the doctor is, I explained that physician was not part of meeting for a Palliative discussion.    Family tells me adamantly that "he said he would be here"   I called CCM doctor and he is currently in an emergency situation and will contact family as soon as he can.  - continued conversation with patient's family ( daughter Angelyn Punt, son Marisue Humble and daughter in law Amal # [phone])  to discuss current medical situation   -- conversation was emotional charged, daughter in law with escalated voice, forward leaning body position, refusal to have a seat.  Suggestion for continuation of meeting in conference room.     There are many pointed  questions regarding course of medical treatment plan, ie; use of steroids, wean trials, daily weight, use of  Sedation)   These questions are be deferred to [facility] doctors.    -family verbalizing many dissatisfactions with current hospital stay and previous experience at [facility], different perspectives between various family members.  Active listening and emotional support.  -Conversation shifts,  family wish to discuss specifically what care options will look like if patient proceeds with  trach, PEG.  Family verbalize they have already made the decision and will move forward with trach "if that is what is necessary to  prolong life."  -attempted to educate family that it is difficult to predict the future needs of this patient at this point in time   -we did discuss the complexities of her medical situation and the limitations of medical intervetnions when the body begins to fail.   I will contact Case management/SW in morning to hopefully help family understand care options of a patient anticipating needs for  SNF with trach, PEG  and possibly vent dependence.  - I asked for input and direction as far as cultural needs go  and the best way to communicate with family from a  culturally sensitive approach.  We discussed the need for interpretor to eliminate miscommunication.    -Family request daughter in law/Amal to be updated daily by any physicain seeing this patient.  Amal  wishes to make it clear that the main decision maker is Raja Corter/ daughter/HPOA   - Values and goals of care important to patient and family were attempted to be elicited.   Discussed the presence and interpretation of documented AD.  There was also  confusion regarding desire for DNR status, for now family wish to continue with Full Code status.   Questions and concerns addressed to the best of my knowledge.   Family encouraged to call with questions or concerns.  PMT will continue to support holistically.    Length of Stay: 13 days  Current Medications: Scheduled Meds:  . sodium chloride   Intravenous Once  . acetylcysteine  2 mL Nebulization Q6H  . albuterol  2.5 mg Nebulization Q6H  . antiseptic oral rinse  7 mL Mouth Rinse 10 times per day  . atorvastatin  40 mg Oral q1800  . aztreonam  1 g Intravenous 3 times per day  . carvedilol  3.125 mg Oral BID WC  . chlorhexidine gluconate  15 mL Mouth Rinse BID  . feeding supplement (NEPRO CARB STEADY)  1,000 mL Per Tube Q24H  . feeding supplement (PRO-STAT SUGAR FREE 64)  60 mL Per Tube TID  . ferrous sulfate  300 mg Per Tube Daily  . insulin aspart  0-9 Units Subcutaneous  Q4H  . methylPREDNISolone (SOLU-MEDROL) injection  80 mg Intravenous Q6H  . pantoprazole (PROTONIX) IV  40 mg Intravenous Q24H  . primidone  50 mg Oral QHS  . sodium chloride flush  10-40 mL Intracatheter Q12H  . sodium chloride flush  3 mL Intravenous Q12H    Continuous Infusions: . sodium chloride Stopped (11/19/15 0700)  . sodium chloride 10 mL/hr at 11/20/15 0700  . sodium chloride 50 mL/hr at 11/20/15 0800  . propofol (DIPRIVAN) infusion 23 mcg/kg/min (11/20/15 0940)    PRN Meds: sodium chloride, fentaNYL (SUBLIMAZE) injection, hydrALAZINE, ipratropium-albuterol, metoprolol, midazolam, ondansetron **OR** ondansetron (ZOFRAN) IV, sodium chloride flush  Physical Exam: Physical Exam  Constitutional: She appears ill. She is intubated.  Pulmonary/Chest: She is intubated.  bilateral equal air movement  Neurological: She is unresponsive.  Skin: Skin is warm and dry.                Vital Signs: BP 142/52 mmHg  Pulse 60  Temp(Src) 97.3 F (36.3 C) (Oral)  Resp 16  Ht  (1.575 m)  Wt 85.9 kg (189 lb 6 oz)  BMI 34.63 kg/m2  SpO2 100% SpO2: SpO2: 100 % O2 Device: O2 Device: Ventilator O2 Flow Rate: O2 Flow Rate (L/min): 5 L/min  Intake/output summary:  Intake/Output Summary (Last 24 hours) at 11/20/15 1131 Last data filed at 11/20/15 1100  Gross per 24 hour  Intake 2445.36 ml  Output   1335 ml  Net 1110.36 ml   LBM: Last BM Date: 11/18/15 Baseline Weight: Weight: 89.9 kg (198 lb 3.1 oz) Most recent weight: Weight: 85.9 kg (189 lb 6 oz)       Palliative Assessment/Data: Flowsheet Rows        Most Recent Value   Intake Tab    Referral Department  Critical care   Unit at Time of Referral  ICU   Palliative Care Primary Diagnosis  Cardiac   Date Notified  11/17/15   Palliative Care Type  New Palliative care   Reason for referral  Clarify Goals of Care   Date of Admission  11/07/15   Date first seen by Palliative Care  11/17/15   # of days Palliative referral  response time  0 Day(s)   # of days IP prior to Palliative referral  10   Clinical Assessment    Psychosocial & Spiritual Assessment    Palliative Care Outcomes       Additional Data Reviewed: CBC    Component Value Date/Time   WBC 6.8 11/20/2015 0445   WBC 6.1 09/27/2015 1523   RBC 3.05* 11/20/2015 0445   RBC 3.60* 09/27/2015 1523   RBC 3.00* 01/08/2011 0625   HGB 8.5* 11/20/2015 0445   HGB 9.8* 09/27/2015 1523   HCT 27.2* 11/20/2015 0445   HCT 32.5* 09/27/2015 1523   HCT 32.5* 09/02/2015 0715   PLT 132* 11/20/2015 0445   PLT 117* 09/27/2015 1523   MCV 89.2 11/20/2015 0445   MCV 90.4 09/27/2015 1523   MCH 27.9 11/20/2015 0445   MCH 27.3 09/27/2015 1523   MCHC 31.3 11/20/2015 0445   MCHC 30.2* 09/27/2015 1523   RDW 20.4* 11/20/2015 0445   RDW 19.2* 09/27/2015 1523   LYMPHSABS 1.3 11/20/2015 0445   LYMPHSABS 0.9 09/27/2015 1523   MONOABS 0.5 11/20/2015 0445   MONOABS 0.6 09/27/2015 1523   EOSABS 0.1 11/20/2015 0445   EOSABS 0.3 09/27/2015 1523   BASOSABS 0.0 11/20/2015 0445   BASOSABS 0.1 09/27/2015 1523    CMP     Component Value Date/Time   NA 147* 11/20/2015 0445   NA 136 09/27/2015 1523   NA 138 02/19/2015   K 3.6 11/20/2015 0445   K 4.9 09/27/2015 1523   CL 113* 11/20/2015 0445   CO2 24 11/20/2015 0445   CO2 30* 09/27/2015 1523   GLUCOSE 146* 11/20/2015 0445   GLUCOSE 134 09/27/2015 1523   BUN 99* 11/20/2015 0445   BUN 26.3* 09/27/2015 1523   BUN 26* 02/19/2015   CREATININE 1.00 11/20/2015 0445   CREATININE 0.9 09/27/2015 1523   CREATININE 0.8 02/19/2015   CREATININE 0.87 04/12/2014 1409   CALCIUM 8.2* 11/20/2015 0445   CALCIUM 8.8 09/27/2015 1523   PROT 5.2* 11/19/2015 0415   PROT 6.9 09/27/2015 1523   ALBUMIN 2.1* 11/19/2015 0415   ALBUMIN 2.9* 09/27/2015 1523   AST 67* 11/19/2015 0415   AST 72* 09/27/2015 1523   ALT 36 11/19/2015 0415   ALT 32 09/27/2015 1523   ALKPHOS 82 11/19/2015 0415   ALKPHOS 163* 09/27/2015 1523   BILITOT 0.7  11/19/2015 0415   BILITOT 0.53 09/27/2015 1523   GFRNONAA 51* 11/20/2015 0445   GFRAA 59* 11/20/2015 0445       Problem List:  Patient Active Problem List   Diagnosis Date Noted  . Cough with hemoptysis 11/20/2015  . Coughing up blood   . Palliative care encounter 11/17/2015  . DNR (do not resuscitate) discussion 11/17/2015  . Hemoptysis   . Acute respiratory failure (HCC)   . Pressure ulcer 11/12/2015  . Acute encephalopathy 11/08/2015  . Acute respiratory failure with hypercapnia (HCC) 11/08/2015  . Type 2 diabetes mellitus with vascular disease (HCC) 11/08/2015  . Acute on chronic diastolic CHF (congestive heart failure), NYHA class 1 (HCC) 11/08/2015  . Somnolence   . Altered mental status 11/07/2015  . Hx of CABG-'00 11/01/2015  . Chronic diastolic heart failure (HCC) 09/12/2015  . DM type 2 goal A1C below 7.5 09/04/2015  . HCAP (  healthcare-associated pneumonia) 09/04/2015  . Diastolic CHF, acute on chronic (HCC) 09/04/2015  . Obesity (BMI 30-39.9) 09/04/2015  . Symptomatic anemia 09/01/2015  . Essential hypertension 07/16/2014  . Hyperlipidemia LDL goal <70 07/16/2014  . Type 2 diabetes mellitus (HCC) 04/17/2014  . CAD S/P RCA PCI Feb '06 03/19/2013  . Edema of both legs 03/19/2013  . Thrombocytopenia (HCC) 09/15/2011  . Bilateral carotid artery disease (HCC) 09/14/2011  . Hypertension 09/14/2011  . GERD (gastroesophageal reflux disease) 09/14/2011  . Iron deficiency anemia 09/14/2011  . Hyperlipidemia 09/14/2011  . Chronic systolic heart failure (HCC) 09/14/2011  . Hyponatremia 09/13/2011  . Pulmonary edema 09/13/2011     Palliative Care Assessment & Plan    1.Code Status:  Full code    Code Status Orders        Start     Ordered   11/08/15 0124  Full code   Continuous     11/08/15 0125    Code Status History    Date Active Date Inactive Code Status Order ID Comments User Context   09/01/2015  7:44 PM 09/05/2015  9:52 PM Full Code [id number]   Yevonne Pax, MD Inpatient   09/19/2011 10:19 PM 09/30/2011  9:00 PM Full Code [id number]  Valaria Good, RN Inpatient   09/12/2011  9:10 PM 09/16/2011  5:26 PM Full Code [id number]  Dorothea Ogle, MD ED        Goals of Care/Additional Recommendations:   Family is open to all offered and available medical interventions to prolong life.     Discharge Planning:  -  Pending outcomes  Care plan was discussed with  Dr   Marchelle Gearing  Thank you for allowing the Palliative Medicine Team to assist in the care of this patient.   Time In: 1200 Time Out: 1400 Total Time 120 min Prolonged Time Billed  yes         Canary Brim, NP  11/20/2015, 11:31 AM  Please contact Palliative Medicine Team phone at [phone] for questions and concerns.

## 2015-11-20 NOTE — Progress Notes (Signed)
PULMONARY / CRITICAL CARE MEDICINE   Name: [name] MRN: [id number] DOB: [birth year]    ADMISSION DATE:  11/07/2015 CONSULTATION DATE:  11/08/15 LOS 60 das  REFERRING MD: Cyndia Diver, A  CHIEF COMPLAINT:  Altered mental status, hypercarbic resp failure.    CULTURES: 11/17/15 - resp culture - normal flora   ANTIBIOTICS: Vanc 02/14 >> 2/16, 2/24 >>2/26 Azactam 02/14 >> 2/21, 2/24 >>    LINES/TUBES: ETT 02/14>>> L IJ TLC 2/14>> Left chest tube 2/23 >>  SIGNIFICANT EVENTS: CT head 2/13. Chronic age-related changes, old infarcts. No acute abnormality. MRI head 2/13. No acute intracranial process ................................ 2/14  Admit with altered mental status, hypercarbic, failed bipap / intubated - Chest x-ray 2/14 bilateral pleural effusions, basilar opacities 2/17  Remains on lasix gtt, precedex  2/19 :  No acute events overnight.  Pt more alert/awake this am.  Follows commands (when demonstrated first)   11/14/15- pn prn fentanyl. Just started sbt . Daughter at bedside. RN reports patient exhausted and edematous and sometimes agitated . Restart scheduled lasix    11/15/15 - ET tube too low. Got 1 unit prbc for hg < 7gm%. Stool OB x 1 negative. Failed SBT. Lasix held by cards due o cxr improvement and cVP 5. Improving creat but worsening BUN to 90s. Echo pending.  Daughte rreports patien bed bound and in diapers since 2009 but normal mental status . Mild dysphagia at baseline for hard solids +  11/16/15 - EF 35%. CVP up again and cards gave 1 dose lasix. BUN still high 80s. CXr stil congested. Doing SBT x 3h off precedex. Normal WUA per RN. Looks exhausted . FOBT x 2 ngative . DPOA indicating full code + full med care  11/17/15 - massive hemoptysis post extubation - new finding with emergent bronch and intubation and s/p l;eft ches ttube for pneumothorax. This AM -> on prn sedation. Not on pressors. BUN continues to rise. No hemoptysis this AM  11/18/15 - Pall care  meeting planned for 11/20/15. On chart reviw daughter Ron Parker showing signs of spiritical distress - yelling at RN and feeling mom not being taken care of well. GBM an RF negative yesterd. BUn/creat rising. Mild intermittent hemoptysis + via ETT tube. Daughter [name] DPOA at bedside. Had me update patient daughter in law Ivan Croft RN over speaker phone. 40% fio3  Yesterday - daughter [name] -> indicated no tracheostomy to PCCM MD. And per RN Kenney Houseman -> other daughter Ron Parker also indicated no tracheostomy but at same tme they are indicating full code and full medical care  11/19/15 -  No further hemoptysis.  Got 1 unit prbc overnight.   Per RN  Janett Billow - reports of overnigth intermittent agitation but currently RASS -3 on diprivan gtt. Per Daugther haifa (non dpoa)- patient was without agitation till 3am and is upset that RN reporting that there was agitation. [name] does NOT want RN Wells Guiles taking care of patient Unclear to MD if family member upset that patient is sedated or she got agitated. Not on pressors.   BUN continues to climb - renal follwing. Duplex LE - negative for DVT. Path from 11/17/15 - shows blood clot; no malignant cells.     SUBJECTIVE/OVERNIGHT/INTERVAL HX 11/20/15 - pall care meeting for goals later . Some hemoptysis with desats 16h ago and again at 4am today.  Ovenright no issues. BUN stabilizing at 99. WUA gets agitated - off diprivan gtt. Left chest tube without drain. Not on pressors. Daughter  [name] DPOA at bedside  VITAL SIGNS: BP 134/53 mmHg  Pulse 62  Temp(Src) 98.8 F (37.1 C) (Oral)  Resp 18  Ht  (1.575 m)  Wt 85.9 kg (189 lb 6 oz)  BMI 34.63 kg/m2  SpO2 100%  HEMODYNAMICS: CVP:  [4 mmHg-8 mmHg] 8 mmHg  VENTILATOR SETTINGS: Vent Mode:  [-] PRVC FiO2 (%):  [40 %] 40 % Set Rate:  [16 bmp] 16 bmp Vt Set:  [400 mL] 400 mL PEEP:  [5 cmH20] 5 cmH20 Plateau Pressure:  [19 cmH20-22 cmH20] 21 cmH20  INTAKE / OUTPUT: I/O last 3 completed shifts: In: 4182.5  [I.V.:2501.3; Blood:30; Other:335; NG/GT:666.3; IV Piggyback:650] Out: 1710 [Urine:1640; Chest Tube:70]  PHYSICAL EXAMINATION: General: Chronically ill appearing elderly female, on vent. LOOKS DECONDITIONED and CRIICALLY ILL.  Neuro:  RASS -3 on diprivan + prn sedation HEENT: [place]/AT, PERRL, EOM-I and MMM.  Cardiovascular:  RRR, Nl S1/S2, -M/R/G. Lungs:  Even/non-labored on vent, lungs bilaterally diminished . Left chest tube to ddrain Abdomen: Soft, NT, ND and +BS. Ext: 1+ edema and -tenderness. Skin: Intact.   LABS:  PULMONARY  Recent Labs Lab 11/15/15 0710 11/16/15 0435 11/17/15 0145 11/17/15 0400 11/18/15 0400  PHART  --   --  7.445  --   --   PCO2ART  --   --  46.8*  --   --   PO2ART  --   --  194.0*  --   --   HCO3  --   --  32.1*  --   --   TCO2  --   --  34  --   --   O2SAT 70.5 70.4 100.0 62.3 83.3    CBC  Recent Labs Lab 11/19/15 0415 11/19/15 1503 11/20/15 0445  HGB 6.9* 8.0* 8.5*  HCT 22.5* 26.8* 27.2*  WBC 5.6 7.7 6.8  PLT 148* 137* 132*    COAGULATION  Recent Labs Lab 11/17/15 0035 11/19/15 0415  INR 1.18 1.13    CARDIAC    Recent Labs Lab 11/17/15 0345 11/17/15 0931 11/17/15 1800  TROPONINI 1.74* 2.00* 1.66*   No results for input(s): PROBNP in the last 168 hours.   CHEMISTRY  Recent Labs Lab 11/16/15 0446 11/17/15 0035 11/17/15 0345 11/18/15 0422 11/19/15 0415 11/20/15 0445  NA 137 143 143 142 145 147*  K 3.0* 4.0 3.8 3.1* 3.4* 3.6  CL 96* 100* 99* 101 107 113*  CO2 GLUCOSE 146* 164* 135* 132* 152* 146*  BUN 99* 100* 101* 100* 107* 99*  CREATININE 0.93 0.95 1.00 1.03* 0.99 1.00  CALCIUM 8.5* 8.6* 8.6* 8.8* 8.6* 8.2*  MG 2.4  --  2.4 2.3 2.1 2.2  PHOS 4.3  --  5.1* 4.4 4.5 4.0   Estimated Creatinine Clearance: 44.1 mL/min (by C-G formula based on Cr of 1).   LIVER  Recent Labs Lab 11/17/15 0035 11/19/15 0415  AST  --  67*  ALT  --  36  ALKPHOS  --  82  BILITOT  --  0.7  PROT  --  5.2*   ALBUMIN  --  2.1*  INR 1.18 1.13     INFECTIOUS  Recent Labs Lab 11/18/15 1122 11/19/15 11/19/15 0415 11/20/15 0445  LATICACIDVEN  --  1.8  --   --   PROCALCITON 1.73  --  1.36 0.89     ENDOCRINE CBG (last 3)   Recent Labs  11/19/15 2040 11/19/15 2351 11/20/15 0449  GLUCAP 137* 129* 135*  IMAGING x48h  - image(s) personally visualized  -   highlighted in bold Dg Chest Port 1 View  11/19/2015  CLINICAL DATA:  Atelectasis. EXAM: PORTABLE CHEST 1 VIEW COMPARISON:  11/19/2015 FINDINGS: The ETT is in good position. Other support apparatus is stable. No pneumothorax. Layering effusion and associated atelectasis in the right base is stable. No other changes. IMPRESSION: Stable support apparatus. Mild effusion and atelectasis in the right base. Electronically Signed   By: Dorise Bullion III M.D   On: 11/19/2015 18:05   Dg Chest Port 1 View  11/19/2015  CLINICAL DATA:  Evaluate ET tube. EXAM: PORTABLE CHEST 1 VIEW COMPARISON:  November 18, 2015 FINDINGS: The ETT is in stable position, terminating approximately 2 cm above the carina. The remainder of the support apparatus is stable. No pneumothorax. Cardiomegaly remains. No change in the cardiomediastinal silhouette. Mild opacity remains in the right base. No other changes. IMPRESSION: Appropriate placement of support apparatus. Mild opacity remains in the right base. Electronically Signed   By: Dorise Bullion III M.D   On: 11/19/2015 07:08      ASSESSMENT / PLAN:  PULMONARY A: Hypercarbic respiratory failure - in setting of CHF, effusions and LLL PNA,  - 2/14 - failed bipap and intubated  - 11/16/15 - failed extubation with onset of hemoptysis and left ptx and reintubated 2/23.  -  CLot sent to path dept. - neg for malignancy   - CT 2/23 - RML and RLL process. DUplex LE 2/24 - neg dvt  - Vasculitis and autoimmune negative 11/17/15 except ANA 1:80 and trace positive (non specific)   - 11/20/15 ->  Hemoptysis again x  2 in 16h. Meets indication for bronch (hemoptysis)  and trach (prolonged mech vent)   P:   Flexible bronch at bedside Full vent suppot -> pall care for goals -> daughter now indicating likely will proceed for trach Albuterol BID  Duoneb Q4 PRN    CARDIOVASCULAR A:  Coronary artery disease. S/p multiple stents and CABG. Hx HTN, CHF   - siginficant 3rd spacing + , Systolic CHF acute on chronic - s/p repeat lasix x 1  11/18/15  P:  per card and renal  RENAL A:   #baseline   - bun 20/creat 1  #currently AKI.   bun worsening. FOBT x 2 negative this admit and neg x 1 in dec 2016. Autimmune negative 11/18/15  - bun improving 2/26. Making urine P:   Per REnal consult   GASTROINTESTINAL A:   GI prophylaxis. Nutrition. Ammonia elevated, likely hepatic congestion. Constipation    -  tF - on low protein since 11/18/15 due to high bun.   P:   SUP: pantoprazole. TF - low protein Dc - Senakot 2 tablets BID and Miralax on 2/25 (soft stools)   HEMATOLOGIC  Recent Labs Lab 11/19/15 0415 11/19/15 1503 11/20/15 0445  HGB 6.9* 8.0* 8.5*  HCT 22.5* 26.8* 27.2*  WBC 5.6 7.7 6.8  PLT 148* 137* 132*    A:   Anemia. Thrombocytopenia. VTE prophylaxis.   - got 1 unit PRBc 2/2/1 - FOBT x 2 negative and repeat prbc 11/18/15  P:  Ferrous sulfate since 11/16/15 per family request Transfuse for Hgb < 7. Monitor platelet counts. SCD's. Monitor CBC   INFECTIOUS    A:   Recent UTI and LLL PNA. S/p azactam ending 11/15/15   - no fever 11/18/15. Restarted abx 11/18/15 due to  recurrent hemoptysis . Normal flora 2/23. Path clot  2/23 -  only blood   P:   Cultures as above   vanc 2/24 >> 2/26  aztreonam 11/18/15 > (consider DC depending on course)  ENDOCRINE A:   Diabetes mellitus. P:   SSI.  NEUROLOGIC A:   #Baseline   - old strokes . Bed bound x 2009 but normal conversation. In diapers  #Now Acute hypercarbic + hepatic encephalopathy - failed BiPAP and required  intubation PM of 02/14 and 11/17/15   - apppears very deconditioned and has agitation on WUA. On diprivan since  11/18/15 (CK, lactate 2/25)  P Diprivan gtt  (lactate normal, CK - recheck 2/27) Prn sedation RASS goal: 0 to -1. Daily WUA. Continue Primidone for parkinson (once daily for Estimated Creatinine Clearance: 44.1 mL/min (by C-G formula based on Cr of 1).) - if agitation continues might need neuro input on this drug   FAMILY  - Updates: No family available am 2/19.  Daughter at bedside updated.DPOA Raja updated on phone 11/14/15  - Inter-disciplinary family meet or Palliative Care meeting due by:  2/21. - done with dpoa Raja and RN Joaquim Lai at bedside 11/15/15 - daughter body language is of full code + full medical care. Discussed IDT again with RN Joaquim Lai -> DPOA says "this is my mom and you have to do everything". Reintubation ok.  Dont again 11/17/15 with RN Kenney Houseman and DPOA [name]  -> explained heading into chronic critical lillnesss and implications of trach, ltac and uncertainties v comfort approach. Daughter is not sure. Feels LTAC approach is not helpful but does not want to give up on mom. Will need collective family input as well. She has agreed to meet pall care    - IDT meet again with Delmer Islam with RN Kenney Houseman and daughter in law of patient Ailyn Gladd an RN  Who was on speaker phone - long conversation covering all of above issues. Amal got upset that I was talking to her like a doctor would to a layman . I apologized and explained though I knew she was RN bcause it was on requested speaker phone I had to balance language so that DPOA Raja could also understand. Owens Loffler was very concerned that we were not doing anything and proposed discussion with palliative care about trach/ltach was being done prematurely and interim CCM was not doing anything for patient. I explained that this only about a conversation due to high pretest prob for indication of trach/ltac and explained all above  interventions being done.   - Updated daughter [place] at bedside 11/19/15 -. Pall care meeting 11/20/15 - to discuss options - patient heading to trach   - updated daugther Raja DPOA 11/20/15 - pall care meeting for goals  GLOBAL Full code. Pall care meeting 2/26. Bronch 2/26 for hemoptysis (will not wait for trach due to recurrence x 2). BUn stabilizing. Not on presors. Still encephlopathic. Meets trach indication unless family decides to withdrwaw   The patient is critically ill with multiple organ systems failure and requires high complexity decision making for assessment and support, frequent evaluation and titration of therapies, application of advanced monitoring technologies and extensive interpretation of multiple databases.   Critical Care Time devoted to patient care services described in this note is  35  Minutes. This time reflects time of care of this signee Dr Brand Males. This critical care time does not reflect procedure time, or teaching time or supervisory time of PA/NP/Med student/Med Resident etc but could involve care discussion time    Dr. Brand Males,  M.D., F.C.C.P Pulmonary and Critical Care Medicine Staff Physician [facility] Pager: [phone], If no answer or between  15:00h - 7:00h: call [phone]  11/20/2015 8:19 AM

## 2015-11-20 NOTE — Progress Notes (Signed)
Patient has had 3 loose stools in 24 hours, MD notified and enteric precautions initiated.  Called the interpretor line to assist in communicating with patient's family.  Educated [name], with interpretor assistance, on C. Diff precautions, why precautions are being initiated, and that the next time patient has a bowel movement a sample will be sent for testing.  [name] stated she understood and had no questions at this time.

## 2015-11-20 NOTE — Progress Notes (Signed)
Amal Salah and Kavin Leech, RN discussed patient status. During conversation, Warren Danes spoke in loud tones, widened eyes, and stood in close proximity to [employer]. Warren Danes advised that she spoke with Palliative care and that she would like her contact info added to patient's chart however that she is not to be called in an emergency situation and that she is not a decision maker in patient's care.  Amal also advised that she would like the physicians to call her daily when they round to provide an update on patient's status so that Amal can assist in communicating with the family members as there is a language barrier.  Amal also advised RN that if the RN's have any concerns with the family members or any communication issues they can call her to assist.    RN discussed restraint use with Amal and that propofol would be stopped per physician order and fentanyl and versed IV used as needed if patient becomes agitated. Amal advised that she preferred restraint use, including soft wrist restraints and patient safety mitts, over the use of sedation and that she would communicate to the family the need for restraints and that she would communicate to the family that are not to not untie the restraints at any time.  Amal advised she understands the use of fentanyl and versed as needed but wants to make sure that the patient is not over sedated however she does want the patient to be safe and comfortable.  Amal expressed that her concern with sedation was that the patient would not be able to be weaned from ventilator and that RN's were sedating the patient for their convenience. Reassured and educated Amal that sedation was being titrated and that the propofol was going to be turned off, and PRN fentanyl and versed would be used to keep patient safe and comfortable if needed but that restraints would be in place to assist in keeping patient safe to reduce the need for sedation.  Conversation witnessed by Edd Fabian, RN.

## 2015-11-20 NOTE — Progress Notes (Signed)
Subjective:  No significant change- hgb stable with blood- is 2 liters  positive and TF changed to [name] lower protein- made 1100 of urine- BUN down from 107 to 99  Objective Vital signs in last 24 hours: Filed Vitals:   11/20/15 0500 11/20/15 0600 11/20/15 0700 11/20/15 0800  BP: 146/50 134/53 120/42 142/52  Pulse: 55 62 53 60  Temp:      TempSrc:      Resp: Height:      Weight:      SpO2: 100% 100% 100% 100%   Weight change:   Intake/Output Summary (Last 24 hours) at 11/20/15 0843 Last data filed at 11/20/15 0827  Gross per 24 hour  Intake 2850.75 ml  Output   1330 ml  Net 1520.75 ml     Assessment/Plan: 82 year old white female with [name] fairly significant past medical history who is had now [name] 11 day hospitalization requiring ventilatory support. Other features of hospitalization include hemoptysis, need for high-protein tube feeds as well as apparent significant diuresis on multiple diuretic medications 1.Renal- BUN elevated out of proportion to creatinine. I think the reason for this is multifactorial. First of all,  significant diuresis since she has been here  on at any given time Lasix, Diamox, metolazone as well as Aldactone, she is receiving high-protein tube feeds, and she also had significant hemoptysis so is likely digesting some of the blood related to that with progressive anemia. No more diuretics for [name] CVP still 8- try to keep her positive.  changed her tube feeds over to Nepro, but  there will likely not be anything that we can do about her digesting hemoglobin. Her baseline creatinine is probably in the 0.8 range because I doubt that she has much muscle mass. Therefore, she could she has developed [name] little bit of renal insufficiency as well. This is going to resolve slowly  2. Hypertension/volume - as above don't believe she is wet. Was 2 liters positive- gentle fluids have been stopped 3. Anemia - supportive care 4. Dispo- as an aside patient would not be [name]  candidate for chronic dialysis therapy due to her bedbound and chronically ill status 5. Pyuria- we'll send urine for culture- is neg  but also is already on aztreonam and vancomycin   [name]    Labs: Basic Metabolic Panel:  Recent Labs Lab 11/18/15 0422 11/19/15 0415 11/20/15 0445  NA 142 145 147*  K 3.1* 3.4* 3.6  CL 101 107 113*  CO2 GLUCOSE 132* 152* 146*  BUN 100* 107* 99*  CREATININE 1.03* 0.99 1.00  CALCIUM 8.8* 8.6* 8.2*  PHOS 4.4 4.5 4.0   Liver Function Tests:  Recent Labs Lab 11/19/15 0415  AST 67*  ALT 36  ALKPHOS 82  BILITOT 0.7  PROT 5.2*  ALBUMIN 2.1*   No results for input(s): LIPASE, AMYLASE in the last 168 hours. No results for input(s): AMMONIA in the last 168 hours. CBC:  Recent Labs Lab 11/17/15 0345 11/18/15 0422 11/19/15 0415 11/19/15 1503 11/20/15 0445  WBC 10.3 6.5 5.6 7.7 6.8  NEUTROABS 8.4* 4.8 4.0  --  5.0  HGB 8.4* 7.1* 6.9* 8.0* 8.5*  HCT 27.8* 23.3* 22.5* 26.8* 27.2*  MCV 88.5 88.6 90.7 89.6 89.2  PLT 147* 158 148* 137* 132*   Cardiac Enzymes:  Recent Labs Lab 11/17/15 0345 11/17/15 0931 11/17/15 1800 11/19/15 0415  CKTOTAL  --   --   --  23*  CKMB  --   --   --  1.5  TROPONINI 1.74* 2.00* 1.66*  --    CBG:  Recent Labs Lab 11/19/15 1126 11/19/15 1716 11/19/15 2040 11/19/15 2351 11/20/15 0449  GLUCAP 125* 106* 137* 129* 135*    Iron Studies: No results for input(s): IRON, TIBC, TRANSFERRIN, FERRITIN in the last 72 hours. Studies/Results: Dg Chest Port 1 View  11/19/2015  CLINICAL DATA:  Atelectasis. EXAM: PORTABLE CHEST 1 VIEW COMPARISON:  11/19/2015 FINDINGS: The ETT is in good position. Other support apparatus is stable. No pneumothorax. Layering effusion and associated atelectasis in the right base is stable. No other changes. IMPRESSION: Stable support apparatus. Mild effusion and atelectasis in the right base. Electronically Signed   By: Gerome Sam III M.D   On:  11/19/2015 18:05   Dg Chest Port 1 View  11/19/2015  CLINICAL DATA:  Evaluate ET tube. EXAM: PORTABLE CHEST 1 VIEW COMPARISON:  November 18, 2015 FINDINGS: The ETT is in stable position, terminating approximately 2 cm above the carina. The remainder of the support apparatus is stable. No pneumothorax. Cardiomegaly remains. No change in the cardiomediastinal silhouette. Mild opacity remains in the right base. No other changes. IMPRESSION: Appropriate placement of support apparatus. Mild opacity remains in the right base. Electronically Signed   By: Gerome Sam III M.D   On: 11/19/2015 07:08   Medications: Infusions: . sodium chloride Stopped (11/19/15 0700)  . sodium chloride 10 mL/hr at 11/19/15 1737  . sodium chloride 50 mL/hr at 11/19/15 1736  . propofol (DIPRIVAN) infusion Stopped (11/20/15 0827)    Scheduled Medications: . sodium chloride   Intravenous Once  . acetylcysteine  2 mL Nebulization Q6H  . albuterol  2.5 mg Nebulization Q6H  . antiseptic oral rinse  7 mL Mouth Rinse 10 times per day  . atorvastatin  40 mg Oral q1800  . aztreonam  1 g Intravenous 3 times per day  . carvedilol  3.125 mg Oral BID WC  . chlorhexidine gluconate  15 mL Mouth Rinse BID  . feeding supplement (NEPRO CARB STEADY)  1,000 mL Per Tube Q24H  . feeding supplement (PRO-STAT SUGAR FREE 64)  60 mL Per Tube TID  . ferrous sulfate  300 mg Per Tube Daily  . insulin aspart  0-9 Units Subcutaneous Q4H  . lidocaine (PF)  10 mL Other Once  . pantoprazole (PROTONIX) IV  40 mg Intravenous Q24H  . primidone  50 mg Oral QHS  . sodium chloride flush  10-40 mL Intracatheter Q12H  . sodium chloride flush  3 mL Intravenous Q12H    have reviewed scheduled and prn medications.  Physical Exam: General: sedated on vent- daughter at bedside  Heart: RRR Lungs: CBS bilat Abdomen: obese, soft, non tender Extremities: obese- difficult to determine dependent edema     11/20/2015,8:43 AM  LOS: 13 days

## 2015-11-20 NOTE — Progress Notes (Signed)
Raja, patient's daughter, asked RN if patient shaking her head side to side as if gesturing no was normal.  RN called interpretor line and utilized an Arabic speaking interpretor.  RN advised Raja that the propofol had been turned off and patient was now awake, alert, and minimally restless.  Educated and advised Raja that patient was currently in bilateral soft wrist restraints as patient did reach for ETT with right hand.  Educated Raja that PRN fentanyl and versed were available however RN utilizing a soothing tone of voice and hand holding to assist in calming patient due to language barrier. Educated Raja that if family felt patient was uncomfortable and in need of PRN sedation to inform RN.  Raja advised she understood and had no additional questions.    Raja at patient's bedside speaking to patient in Arabic and holding patient's hand, patient's restlessness reduced.

## 2015-11-20 NOTE — Procedures (Signed)
Bronchoscopy Procedure Note [name] [id number] 06-02-1931  Procedure: Bronchoscopy Indications: repeated hemoptysis  Procedure Details Consent: repeated hemoptysis with big clots Time Out: Verified patient identification, verified procedure, site/side was marked, verified correct patient position, special equipment/implants available, medications/allergies/relevent history reviewed, required imaging and test results available.  Performed  In preparation for procedure, patient was given 100% FiO2, bronchoscope lubricated, inhaled beta agonist administered and lidocaine given via ETT (10 ml). Sedation: fent versed dipriovan  Airway entered and the following bronchi were examined: RUL, RML, RLL, LUL, LLL and Bronchi.   Procedures performed:none Bronchoscope removed.  , Patient placed back on 100% FiO2 at conclusion of procedure.    FINDINGS - lot of blood clot in et tube suctioned out. Then clear visual obtained. Detailed airway exam done. It seemed there was NO active bleeding but on suctioning Left lower lobe dependent segments she would have blood clot aspirated.  PLAN - start steroids empiric - left side down if major hemoptysis and go to IR if needed  - avoid active suctioning unless needed  Evaluation Hemodynamic Status: BP stable throughout; O2 sats: stable throughout Patient's Current Condition: stable Specimens:  None Complications: No apparent complications Patient did tolerate procedure well.   Daughter updated. Will need repeat bronch with better video visualization if they commit to trach  CXR ordered  [facility] 11/20/2015

## 2015-11-21 ENCOUNTER — Inpatient Hospital Stay (HOSPITAL_COMMUNITY): Payer: Medicare Other

## 2015-11-21 ENCOUNTER — Encounter (HOSPITAL_COMMUNITY): Payer: Medicare Other

## 2015-11-21 ENCOUNTER — Telehealth: Payer: Self-pay | Admitting: *Deleted

## 2015-11-21 LAB — GLUCOSE, CAPILLARY
GLUCOSE-CAPILLARY: 146 mg/dL — AB (ref 65–99)
GLUCOSE-CAPILLARY: 158 mg/dL — AB (ref 65–99)
GLUCOSE-CAPILLARY: 165 mg/dL — AB (ref 65–99)
GLUCOSE-CAPILLARY: 175 mg/dL — AB (ref 65–99)
Glucose-Capillary: 161 mg/dL — ABNORMAL HIGH (ref 65–99)
Glucose-Capillary: 201 mg/dL — ABNORMAL HIGH (ref 65–99)

## 2015-11-21 LAB — BASIC METABOLIC PANEL
ANION GAP: 13 (ref 5–15)
Anion gap: 9 (ref 5–15)
BUN: 100 mg/dL — AB (ref 6–20)
BUN: 100 mg/dL — AB (ref 6–20)
CALCIUM: 8.4 mg/dL — AB (ref 8.9–10.3)
CHLORIDE: 115 mmol/L — AB (ref 101–111)
CO2: 23 mmol/L (ref 22–32)
CO2: 21 mmol/L — AB (ref 22–32)
CREATININE: 0.92 mg/dL (ref 0.44–1.00)
Calcium: 8.7 mg/dL — ABNORMAL LOW (ref 8.9–10.3)
Chloride: 115 mmol/L — ABNORMAL HIGH (ref 101–111)
Creatinine, Ser: 0.95 mg/dL (ref 0.44–1.00)
GFR calc Af Amer: >60 mL/min (ref >60)
GFR calc Af Amer: >60 mL/min (ref >60)
GFR, EST NON AFRICAN AMERICAN: 56 mL/min — AB (ref >60)
GFR, EST NON AFRICAN AMERICAN: 54 mL/min — AB (ref >60)
GLUCOSE: 165 mg/dL — AB (ref 65–99)
GLUCOSE: 191 mg/dL — AB (ref 65–99)
POTASSIUM: 3.9 mmol/L (ref 3.5–5.1)
Potassium: 3.5 mmol/L (ref 3.5–5.1)
Sodium: 147 mmol/L — ABNORMAL HIGH (ref 135–145)
Sodium: 149 mmol/L — ABNORMAL HIGH (ref 135–145)

## 2015-11-21 LAB — CK TOTAL AND CKMB (NOT AT ARMC)
CK, MB: 2.6 ng/mL (ref 0.5–5.0)
RELATIVE INDEX: INVALID (ref 0.0–2.5)
Total CK: 28 U/L — ABNORMAL LOW (ref 38–234)

## 2015-11-21 LAB — C DIFFICILE QUICK SCREEN W PCR REFLEX
C DIFFICILE (CDIFF) INTERP: NEGATIVE
C Diff antigen: NEGATIVE
C Diff toxin: NEGATIVE

## 2015-11-21 LAB — PHOSPHORUS: PHOSPHORUS: 3.6 mg/dL (ref 2.5–4.6)

## 2015-11-21 LAB — CBC WITH DIFFERENTIAL/PLATELET
BASOS ABS: 0 10*3/uL (ref 0.0–0.1)
Basophils Relative: 0 %
EOS PCT: 0 %
Eosinophils Absolute: 0 10*3/uL (ref 0.0–0.7)
HEMATOCRIT: 28.5 % — AB (ref 36.0–46.0)
Hemoglobin: 8.4 g/dL — ABNORMAL LOW (ref 12.0–15.0)
LYMPHS ABS: 0.6 10*3/uL — AB (ref 0.7–4.0)
LYMPHS PCT: 14 %
MCH: 26.8 pg (ref 26.0–34.0)
MCHC: 29.5 g/dL — AB (ref 30.0–36.0)
MCV: 90.8 fL (ref 78.0–100.0)
MONO ABS: 0.2 10*3/uL (ref 0.1–1.0)
MONOS PCT: 4 %
NEUTROS ABS: 3.3 10*3/uL (ref 1.7–7.7)
Neutrophils Relative %: 82 %
PLATELETS: 155 10*3/uL (ref 150–400)
RBC: 3.14 MIL/uL — ABNORMAL LOW (ref 3.87–5.11)
RDW: 20.2 % — AB (ref 11.5–15.5)
WBC: 4 10*3/uL (ref 4.0–10.5)

## 2015-11-21 LAB — TRIGLYCERIDES: TRIGLYCERIDES: 93 mg/dL (ref <150)

## 2015-11-21 LAB — MAGNESIUM
MAGNESIUM: 2.3 mg/dL (ref 1.7–2.4)
Magnesium: 2.3 mg/dL (ref 1.7–2.4)

## 2015-11-21 LAB — LACTIC ACID, PLASMA: LACTIC ACID, VENOUS: 1.7 mmol/L (ref 0.5–2.0)

## 2015-11-21 MED ORDER — HYDRALAZINE HCL 25 MG PO TABS
25.0000 mg | ORAL_TABLET | Freq: Three times a day (TID) | ORAL | Status: DC
  Administered 2015-11-21 – 2015-11-25 (×11): 25 mg via ORAL
  Filled 2015-11-21 (×12): qty 1

## 2015-11-21 MED ORDER — DEXTROSE 5 % IV SOLN
INTRAVENOUS | Status: DC
  Administered 2015-11-21 – 2015-11-23 (×3): via INTRAVENOUS

## 2015-11-21 MED ORDER — HEPARIN SODIUM (PORCINE) 5000 UNIT/ML IJ SOLN
5000.0000 [IU] | Freq: Three times a day (TID) | INTRAMUSCULAR | Status: DC
  Administered 2015-11-21 – 2015-11-23 (×8): 5000 [IU] via SUBCUTANEOUS
  Filled 2015-11-21 (×8): qty 1

## 2015-11-21 MED ORDER — POTASSIUM CHLORIDE 20 MEQ/15ML (10%) PO SOLN
20.0000 meq | Freq: Once | ORAL | Status: AC
  Administered 2015-11-21: 20 meq via ORAL
  Filled 2015-11-21: qty 15

## 2015-11-21 MED ORDER — ISOSORBIDE MONONITRATE ER 30 MG PO TB24
30.0000 mg | ORAL_TABLET | Freq: Every day | ORAL | Status: DC
  Administered 2015-11-21 – 2015-11-24 (×4): 30 mg via ORAL
  Filled 2015-11-21 (×6): qty 1

## 2015-11-21 MED ORDER — FREE WATER
300.0000 mL | Freq: Four times a day (QID) | Status: DC
Start: 2015-11-21 — End: 2015-11-23
  Administered 2015-11-21 – 2015-11-23 (×8): 300 mL

## 2015-11-21 MED ORDER — FUROSEMIDE 10 MG/ML IJ SOLN: 40.0000 mg | Freq: Two times a day (BID) | INTRAMUSCULAR | Status: DC

## 2015-11-21 NOTE — Telephone Encounter (Signed)
Faxed signed order for verbal blood order given on 11/08/15

## 2015-11-21 NOTE — Progress Notes (Signed)
Spoke with family concerning family meeting with CCM MD. They state they have already been told a family meeting would occur Wednesday 11/23/2015, so therefore they cannot change their schedules to have a meeting 11/22/2015. MD Arsenio Loader made aware.

## 2015-11-21 NOTE — Progress Notes (Addendum)
Patient ID: [name], female   DOB: [birth year], 82 y.o.   MRN: [id number]    Advanced Heart Failure Rounding Note   Subjective:    Extubated 2/22 but had acute hypoxic respiratory failure requiring emergent re-intubation.  Bronchoscopy 2/23 to remove obstructive mucus plug/clot, developed PTX.  Clinically fairly unchanged over the weekend.  Awake on vent this morning. CVP 6.  BP trending up now that she is more awake.  Now on Solumedrol.  Got 1 unit PRBCs on 2/21 and again 2/25.   Echo: EF 35-40% with regional WMAs.  Moderate MR with MV prolapse.   Objective:   Weight Range:  Vital Signs:   Temp:  [97.3 F (36.3 C)-98.8 F (37.1 C)] 98.8 F (37.1 C) (02/27 0000) Pulse Rate:  [51-108] 67 (02/27 0700) Resp:  [16-36] 16 (02/27 0700) BP: (106-206)/(38-185) 206/185 mmHg (02/27 0700) SpO2:  [94 %-100 %] 100 % (02/27 0700) FiO2 (%):  [40 %] 40 % (02/27 0600) Weight:  [191 lb 12.8 oz (87 kg)] 191 lb 12.8 oz (87 kg) (02/26 1200) Last BM Date: 11/20/15  Weight change: Filed Weights   11/15/15 0434 11/16/15 0500 11/20/15 1200  Weight: 186 lb 15.2 oz (84.8 kg) 189 lb 6 oz (85.9 kg) 191 lb 12.8 oz (87 kg)    Intake/Output:   Intake/Output Summary (Last 24 hours) at 11/21/15 0750 Last data filed at 11/21/15 0700  Gross per 24 hour  Intake 2324.51 ml  Output   1470 ml  Net 854.51 ml    Physical Exam: Intubated.  CVP 6-7  General: Intubated.  HEENT: normal Neck: supple. JVP 8. Carotids 2+ bilat; no bruits. No lymphadenopathy or thryomegaly appreciated. Cor: PMI nondisplaced. Regular rate & rhythm. No rubs, gallops or murmurs. Lungs: clear Abdomen: soft, nontender, nondistended. No hepatosplenomegaly. No bruits or masses. Good bowel sounds. Extremities: no cyanosis, clubbing, rash, R and LLE edema.  Neuro: Awakens with stimulation. Moves all 4 extremities w/o difficulty.   Telemetry: NSR.    Labs: Basic Metabolic Panel:  Recent Labs Lab 11/17/15 0345  11/18/15 0422 11/19/15 0415 11/20/15 0445 11/21/15 0430  NA 143 142 145 147* 147*  K 3.8 3.1* 3.4* 3.6 3.5  CL 99* 101 107 113* 115*  CO2 '30 28 26 24 23  '$ GLUCOSE 135* 132* 152* 146* 165*  BUN 101* 100* 107* 99* 100*  CREATININE 1.00 1.03* 0.99 1.00 0.92  CALCIUM 8.6* 8.8* 8.6* 8.2* 8.4*  MG 2.4 2.3 2.1 2.2 2.3  PHOS 5.1* 4.4 4.5 4.0 3.6    Liver Function Tests:  Recent Labs Lab 11/19/15 0415  AST 67*  ALT 36  ALKPHOS 82  BILITOT 0.7  PROT 5.2*  ALBUMIN 2.1*   No results for input(s): LIPASE, AMYLASE in the last 168 hours. No results for input(s): AMMONIA in the last 168 hours.  CBC:  Recent Labs Lab 11/17/15 0345 11/18/15 0422 11/19/15 0415 11/19/15 1503 11/20/15 0445 11/21/15 0430  WBC 10.3 6.5 5.6 7.7 6.8 4.0  NEUTROABS 8.4* 4.8 4.0  --  5.0 3.3  HGB 8.4* 7.1* 6.9* 8.0* 8.5* 8.4*  HCT 27.8* 23.3* 22.5* 26.8* 27.2* 28.5*  MCV 88.5 88.6 90.7 89.6 89.2 90.8  PLT 147* 158 148* 137* 132* 155    Cardiac Enzymes:  Recent Labs Lab 11/17/15 0345 11/17/15 0931 11/17/15 1800 11/19/15 0415 11/21/15 0430  CKTOTAL  --   --   --  23* 28*  CKMB  --   --   --  1.5 2.6  TROPONINI  1.74* 2.00* 1.66*  --   --     BNP: BNP (last 3 results)  Recent Labs  09/02/15 0715 11/08/15 0220  BNP 998.9* 2328.7*    ProBNP (last 3 results) No results for input(s): PROBNP in the last 8760 hours.    Other results:  Imaging: Dg Chest Port 1 View  11/21/2015  CLINICAL DATA:  Acute respiratory failure, coronary artery disease, CHF. EXAM: PORTABLE CHEST 1 VIEW COMPARISON:  Portable chest x-ray of November 20, 2015 FINDINGS: The lungs are reasonably well inflated. Pleural fluid is present at the right lung base. There is bibasilar atelectasis or pneumonia. The cardiac silhouette remains enlarged. The pulmonary vascularity is less engorged bilaterally. External pacemaker defibrillator pads are present. There are post CABG changes. Small caliber left chest tube is again  demonstrated on the left with some of the side ports lying outside the chest. The endotracheal tube tip lies approximately 3.2 cm above the carina. The esophagogastric tube tip projects below the inferior margin of the image. The left internal jugular venous catheter tip projects over the midportion of the SVC. For recurrence mid words IMPRESSION: Bibasilar atelectasis and small right pleural effusion. CHF mildly improved. The support tubes are in reasonable position as described. Correlation as to the positioning of the small caliber tube over the left lower lateral chest wall is needed. Electronically Signed   By: David  Martinique M.D.   On: 11/21/2015 07:17   Dg Chest Port 1 View  11/20/2015  CLINICAL DATA:  82 year old female with respiratory failure. EXAM: PORTABLE CHEST 1 VIEW COMPARISON:  11/19/2015 and prior exams FINDINGS: Cardiomegaly, CABG changes and defibrillator pad overlying the left chest again noted. An endotracheal tube with tip 2.2 cm above the carina, NG tube with tip overlying the proximal stomach, left thoracostomy tube and left IJ central venous catheter with tip overlying the lower SVC again noted. There is no evidence of pneumothorax. Continued bilateral lower lung opacities/atelectasis/pleural effusions and pulmonary vascular congestion noted. IMPRESSION: Little significant change with support apparatus and bilateral lower lung opacities again noted. Electronically Signed   By: Margarette Canada M.D.   On: 11/20/2015 09:48   Dg Chest Port 1 View  11/19/2015  CLINICAL DATA:  Atelectasis. EXAM: PORTABLE CHEST 1 VIEW COMPARISON:  11/19/2015 FINDINGS: The ETT is in good position. Other support apparatus is stable. No pneumothorax. Layering effusion and associated atelectasis in the right base is stable. No other changes. IMPRESSION: Stable support apparatus. Mild effusion and atelectasis in the right base. Electronically Signed   By: Dorise Bullion III M.D   On: 11/19/2015 18:05      Medications:     Scheduled Medications: . sodium chloride   Intravenous Once  . acetylcysteine  2 mL Nebulization Q6H  . albuterol  2.5 mg Nebulization Q6H  . antiseptic oral rinse  7 mL Mouth Rinse 10 times per day  . atorvastatin  40 mg Oral q1800  . aztreonam  1 g Intravenous 3 times per day  . carvedilol  3.125 mg Oral BID WC  . chlorhexidine gluconate  15 mL Mouth Rinse BID  . feeding supplement (NEPRO CARB STEADY)  1,000 mL Per Tube Q24H  . feeding supplement (PRO-STAT SUGAR FREE 64)  60 mL Per Tube TID  . ferrous sulfate  300 mg Per Tube Daily  . insulin aspart  0-9 Units Subcutaneous Q4H  . methylPREDNISolone (SOLU-MEDROL) injection  80 mg Intravenous Q6H  . pantoprazole (PROTONIX) IV  40 mg Intravenous Q24H  .  primidone  50 mg Oral QHS  . sodium chloride flush  10-40 mL Intracatheter Q12H  . sodium chloride flush  3 mL Intravenous Q12H    Infusions: . sodium chloride Stopped (11/19/15 0700)  . sodium chloride 10 mL/hr at 11/20/15 0700  . sodium chloride 50 mL/hr at 11/21/15 0601    PRN Medications: sodium chloride, fentaNYL (SUBLIMAZE) injection, hydrALAZINE, ipratropium-albuterol, metoprolol, midazolam, ondansetron **OR** ondansetron (ZOFRAN) IV, sodium chloride flush   Assessment/Plan    1. Hypercarbic Respiratory Failure: Intubated 2/14. ?Baseline OSA. Also suspect PNA. Extubated 2/22 but re-intubated with respiratory distress.  Developed mucus/clot plugging 2/23 and had bronchoscopy, complicated by right PTX and got chest tube.  CCM managing vent, to decide on timing for possible tracheostomy.  She has is on antibiotics and steroids.  2. Acute on chronic systolic CHF: Ischemic cardiomyopathy. Echo with stable EF compared to prior, 35-40%. Co-ox 62% on 2/23.  CVP 7-8, stable.  BUN remains high.   - I do not think that she needs Lasix today.    - Coreg 3.125 bid, hold off on ACEI for now with elevated BUN.  - With BP trending up, add hydralazine 12.5 mg  tid and Imdur 30 daily.  Can increase hydralazine as needed. 3. CAD S/P CABG: CABG in June 2000, with the LIMA to LAD, left radial artery to the OM, SVG to the PDA and SVG to the diagonal. LHC 2006 with PCI to SVG to PDA. Myoview 2013 which showed no ischemia or infarction.  EF stable on echo this admission.  - ASA held with significant anemia.   - Continue home statin.  4. Anemia: has had 2 units PRBCs this stay.  No overt bleeding.  5. Hypokalemia: Supplement K.  6. H/o CVA 7. ID: Recent UTI and PNA treated at [facility], she is on a course of antibiotics.  ?LLL PNA contributing to current respiratory failure.  8. L Pneumothorax: S/P L chest tube.  9. Family met with palliative care, want to remain full code.  From a cardiac standpoint, she is stable.  However, her pulmonary status is very poor.  She was unable to tolerate extubation, likely due to weakness/hypoventilation.  Suspect she has underlying PNA and has also had left PTX.  I think overall prognosis is poor and she is frail.    I will sign off for now as little to offer beyond what CCM is doing currently.  Please call with questions.   Length of Stay: [address]  11/21/2015, 7:50 AM

## 2015-11-21 NOTE — Progress Notes (Signed)
[facility] ROUNDING NOTE   Subjective:   Interval History: intubated alert and non distressed she is with daughter [place]  Objective:  Vital signs in last 24 hours:  Temp:  [97.5 F (36.4 C)-98.8 F (37.1 C)] 98.8 F (37.1 C) (02/27 0000) Pulse Rate:  [56-108] 86 (02/27 0819) Resp:  [15-36] 16 (02/27 0800) BP: (129-206)/(38-185) 165/52 mmHg (02/27 0819) SpO2:  [94 %-100 %] 100 % (02/27 0800) FiO2 (%):  [40 %] 40 % (02/27 0800) Weight:  [87 kg (191 lb 12.8 oz)] 87 kg (191 lb 12.8 oz) (02/26 1200)  Weight change:  Filed Weights   11/15/15 0434 11/16/15 0500 11/20/15 1200  Weight: 84.8 kg (186 lb 15.2 oz) 85.9 kg (189 lb 6 oz) 87 kg (191 lb 12.8 oz)    Intake/Output: I/O last 3 completed shifts: In: 3578.7 [I.V.:2408.7; NG/GT:770; IV Piggyback:400] Out: 2120 [Urine:2090; Chest Tube:30]   Intake/Output this shift:  Total I/O In: 125 [I.V.:60; NG/GT:65] Out: 200 [Urine:200]  CVS- RRR RS- CTA ABD- BS present soft non-distended EXT- no edema   Basic Metabolic Panel:  Recent Labs Lab 11/17/15 0345 11/18/15 0422 11/19/15 0415 11/20/15 0445 11/21/15 0430  NA 143 142 145 147* 147*  K 3.8 3.1* 3.4* 3.6 3.5  CL 99* 101 107 113* 115*  CO2 GLUCOSE 135* 132* 152* 146* 165*  BUN 101* 100* 107* 99* 100*  CREATININE 1.00 1.03* 0.99 1.00 0.92  CALCIUM 8.6* 8.8* 8.6* 8.2* 8.4*  MG 2.4 2.3 2.1 2.2 2.3  PHOS 5.1* 4.4 4.5 4.0 3.6    Liver Function Tests:  Recent Labs Lab 11/19/15 0415  AST 67*  ALT 36  ALKPHOS 82  BILITOT 0.7  PROT 5.2*  ALBUMIN 2.1*   No results for input(s): LIPASE, AMYLASE in the last 168 hours. No results for input(s): AMMONIA in the last 168 hours.  CBC:  Recent Labs Lab 11/17/15 0345 11/18/15 0422 11/19/15 0415 11/19/15 1503 11/20/15 0445 11/21/15 0430  WBC 10.3 6.5 5.6 7.7 6.8 4.0  NEUTROABS 8.4* 4.8 4.0  --  5.0 3.3  HGB 8.4* 7.1* 6.9* 8.0* 8.5* 8.4*  HCT 27.8* 23.3* 22.5* 26.8* 27.2* 28.5*   MCV 88.5 88.6 90.7 89.6 89.2 90.8  PLT 147* 158 148* 137* 132* 155    Cardiac Enzymes:  Recent Labs Lab 11/17/15 0345 11/17/15 0931 11/17/15 1800 11/19/15 0415 11/21/15 0430  CKTOTAL  --   --   --  23* 28*  CKMB  --   --   --  1.5 2.6  TROPONINI 1.74* 2.00* 1.66*  --   --     BNP: Invalid input(s): POCBNP  CBG:  Recent Labs Lab 11/20/15 1200 11/20/15 1538 11/20/15 1906 11/21/15 0008 11/21/15 0431  GLUCAP 123* 154* 176* 165* 146*    Microbiology: Results for orders placed or performed during the hospital encounter of 11/07/15  Urine culture     Status: None   Collection Time: 11/07/15  7:49 PM  Result Value Ref Range Status   Specimen Description URINE, CATHETERIZED  Final   Special Requests NONE  Final   Culture NO GROWTH 2 DAYS  Final   Report Status 11/09/2015 FINAL  Final  MRSA PCR Screening     Status: None   Collection Time: 11/08/15  1:06 AM  Result Value Ref Range Status   MRSA by PCR NEGATIVE NEGATIVE Final    Comment:        The GeneXpert MRSA Assay (FDA approved for  NASAL specimens only), is one component of a comprehensive MRSA colonization surveillance program. It is not intended to diagnose MRSA infection nor to guide or monitor treatment for MRSA infections.   Culture, blood (routine x 2)     Status: None   Collection Time: 11/08/15 12:57 PM  Result Value Ref Range Status   Specimen Description BLOOD RIGHT ARM  Final   Special Requests BOTTLES DRAWN AEROBIC AND ANAEROBIC 10 CC  Final   Culture NO GROWTH 5 DAYS  Final   Report Status 11/13/2015 FINAL  Final  Culture, blood (routine x 2)     Status: None   Collection Time: 11/08/15  1:20 PM  Result Value Ref Range Status   Specimen Description BLOOD LEFT HAND  Final   Special Requests IN PEDIATRIC BOTTLE 2 CC  Final   Culture NO GROWTH 5 DAYS  Final   Report Status 11/13/2015 FINAL  Final  Culture, respiratory (NON-Expectorated)     Status: None   Collection Time: 11/17/15 11:35 AM   Result Value Ref Range Status   Specimen Description TRACHEAL ASPIRATE  Final   Special Requests Normal  Final   Gram Stain   Final    MODERATE WBC PRESENT,BOTH PMN AND MONONUCLEAR RARE SQUAMOUS EPITHELIAL CELLS PRESENT NO ORGANISMS SEEN Performed at [facility]    Culture   Final    NORMAL OROPHARYNGEAL FLORA Performed at [facility]    Report Status 11/20/2015 FINAL  Final  Urine culture     Status: None   Collection Time: 11/18/15  7:01 PM  Result Value Ref Range Status   Specimen Description URINE, CATHETERIZED  Final   Special Requests NONE  Final   Culture >=100,000 COLONIES/mL YEAST  Final   Report Status 11/20/2015 FINAL  Final    Coagulation Studies:  Recent Labs  11/19/15 0415  LABPROT 14.7  INR 1.13    Urinalysis: No results for input(s): COLORURINE, LABSPEC, PHURINE, GLUCOSEU, HGBUR, BILIRUBINUR, KETONESUR, PROTEINUR, UROBILINOGEN, NITRITE, LEUKOCYTESUR in the last 72 hours.  Invalid input(s): APPERANCEUR    Imaging: Dg Chest Port 1 View  11/21/2015  CLINICAL DATA:  Acute respiratory failure, coronary artery disease, CHF. EXAM: PORTABLE CHEST 1 VIEW COMPARISON:  Portable chest x-ray of November 20, 2015 FINDINGS: The lungs are reasonably well inflated. Pleural fluid is present at the right lung base. There is bibasilar atelectasis or pneumonia. The cardiac silhouette remains enlarged. The pulmonary vascularity is less engorged bilaterally. External pacemaker defibrillator pads are present. There are post CABG changes. Small caliber left chest tube is again demonstrated on the left with some of the side ports lying outside the chest. The endotracheal tube tip lies approximately 3.2 cm above the carina. The esophagogastric tube tip projects below the inferior margin of the image. The left internal jugular venous catheter tip projects over the midportion of the SVC. For recurrence mid words IMPRESSION: Bibasilar atelectasis and small right pleural  effusion. CHF mildly improved. The support tubes are in reasonable position as described. Correlation as to the positioning of the small caliber tube over the left lower lateral chest wall is needed. Electronically Signed   By: David  [place] M.D.   On: 11/21/2015 07:17   Dg Chest Port 1 View  11/20/2015  CLINICAL DATA:  82 year old female with respiratory failure. EXAM: PORTABLE CHEST 1 VIEW COMPARISON:  11/19/2015 and prior exams FINDINGS: Cardiomegaly, CABG changes and defibrillator pad overlying the left chest again noted. An endotracheal tube with tip 2.2 cm above the  carina, NG tube with tip overlying the proximal stomach, left thoracostomy tube and left IJ central venous catheter with tip overlying the lower SVC again noted. There is no evidence of pneumothorax. Continued bilateral lower lung opacities/atelectasis/pleural effusions and pulmonary vascular congestion noted. IMPRESSION: Little significant change with support apparatus and bilateral lower lung opacities again noted. Electronically Signed   By: Harmon Pier M.D.   On: 11/20/2015 09:48   Dg Chest Port 1 View  11/19/2015  CLINICAL DATA:  Atelectasis. EXAM: PORTABLE CHEST 1 VIEW COMPARISON:  11/19/2015 FINDINGS: The ETT is in good position. Other support apparatus is stable. No pneumothorax. Layering effusion and associated atelectasis in the right base is stable. No other changes. IMPRESSION: Stable support apparatus. Mild effusion and atelectasis in the right base. Electronically Signed   By: Gerome Sam III M.D   On: 11/19/2015 18:05     Medications:   . sodium chloride Stopped (11/19/15 0700)  . sodium chloride Stopped (11/21/15 0800)  . sodium chloride 50 mL/hr at 11/21/15 0800  . dextrose     . sodium chloride   Intravenous Once  . acetylcysteine  2 mL Nebulization Q6H  . albuterol  2.5 mg Nebulization Q6H  . antiseptic oral rinse  7 mL Mouth Rinse 10 times per day  . atorvastatin  40 mg Oral q1800  . aztreonam  1 g  Intravenous 3 times per day  . carvedilol  3.125 mg Oral BID WC  . chlorhexidine gluconate  15 mL Mouth Rinse BID  . feeding supplement (NEPRO CARB STEADY)  1,000 mL Per Tube Q24H  . feeding supplement (PRO-STAT SUGAR FREE 64)  60 mL Per Tube TID  . free water  300 mL Per Tube Q6H  . heparin subcutaneous  5,000 Units Subcutaneous 3 times per day  . insulin aspart  0-9 Units Subcutaneous Q4H  . methylPREDNISolone (SOLU-MEDROL) injection  80 mg Intravenous Q6H  . pantoprazole (PROTONIX) IV  40 mg Intravenous Q24H  . potassium chloride  20 mEq Oral Once  . primidone  50 mg Oral QHS  . sodium chloride flush  10-40 mL Intracatheter Q12H  . sodium chloride flush  3 mL Intravenous Q12H   sodium chloride, fentaNYL (SUBLIMAZE) injection, hydrALAZINE, ipratropium-albuterol, metoprolol, midazolam, ondansetron **OR** ondansetron (ZOFRAN) IV, sodium chloride flush  Assessment/ Plan:  Assessment/Plan: 82 year old white female with a fairly significant past medical history who is had now a 11 day hospitalization requiring ventilatory support. Other features of hospitalization include hemoptysis, need for high-protein tube feeds as well as apparent significant diuresis on multiple diuretic medications 1.Renal- BUN elevated out of proportion to creatinine. I think the reason for this is multifactorial. First of all, significant diuresis since she has been here on at any given time Lasix, Diamox, metolazone as well as Aldactone, she is receiving high-protein tube feeds, and she also had significant hemoptysis so is likely digesting some of the blood related to that with progressive anemia. No more diuretics for a CVP still 8- try to keep her positive. changed her tube feeds over to Nepro, but there will likely not be anything that we can do about her digesting hemoglobin. Her baseline creatinine is probably in the 0.8 range because I doubt that she has much muscle mass. Therefore, she could she has developed  a little bit of renal insufficiency as well. This is going to resolve slowly stop diuretics and start IV fluids D5 w  2. Hypertension/volume - as above don't believe she is wet. Was 2  liters positive- gentle fluids IV  3. Anemia - supportive care 4. Dispo- as an aside patient would not be a candidate for chronic dialysis therapy due to her bedbound and chronically ill status 5. Pyuria- we'll send urine for culture- is neg but also is already on aztreonam and vancomycin 6. Hypernatremia  Start D5 w    LOS: 14 Bernie Ransford W  :56 AM

## 2015-11-21 NOTE — Progress Notes (Signed)
PULMONARY / CRITICAL CARE MEDICINE   Name: [name] MRN: [id number] DOB: 1930/12/13    ADMISSION DATE:  11/07/2015 CONSULTATION DATE:  11/08/15 LOS 32 das  REFERRING MD: Cyndia Diver, A  CHIEF COMPLAINT:  Altered mental status, hypercarbic resp failure.    CULTURES: 11/17/15 - resp culture - normal flora  ANTIBIOTICS: Vanc 02/14 >> 2/16, 2/24 >>2/26 Azactam 02/14 >> 2/21, 2/24 >>   LINES/TUBES: ETT 02/14>>> L IJ TLC 2/14>> Left chest tube 2/23 >>  SIGNIFICANT EVENTS: CT head 2/13. Chronic age-related changes, old infarcts. No acute abnormality. MRI head 2/13. No acute intracranial process ................................ 2/14  Admit with altered mental status, hypercarbic, failed bipap / intubated - Chest x-ray 2/14 bilateral pleural effusions, basilar opacities 2/17  Remains on lasix gtt, precedex  2/19 :  No acute events overnight.  Pt more alert/awake this am.  Follows commands (when demonstrated first)   11/14/15- pn prn fentanyl. Just started sbt . Daughter at bedside. RN reports patient exhausted and edematous and sometimes agitated . Restart scheduled lasix   11/15/15 - ET tube too low. Got 1 unit prbc for hg < 7gm%. Stool OB x 1 negative. Failed SBT. Lasix held by cards due o cxr improvement and cVP 5. Improving creat but worsening BUN to 90s. Echo pending.  Daughte rreports patien bed bound and in diapers since 2009 but normal mental status . Mild dysphagia at baseline for hard solids +  11/16/15 - EF 35%. CVP up again and cards gave 1 dose lasix. BUN still high 80s. CXr stil congested. Doing SBT x 3h off precedex. Normal WUA per RN. Looks exhausted . FOBT x 2 ngative . DPOA indicating full code + full med care  11/17/15 - massive hemoptysis post extubation - new finding with emergent bronch and intubation and s/p l;eft ches ttube for pneumothorax. This AM -> on prn sedation. Not on pressors. BUN continues to rise. No hemoptysis this AM  11/18/15 -  On chart reviw  daughter Ron Parker showing signs of spiritical distress - yelling at RN and feeling mom not being taken care of well. GBM an RF negative yesterd. BUn/creat rising. Mild intermittent hemoptysis + via ETT tube. Daughter [name] DPOA at bedside. Had me update patient daughter in law Ivan Croft RN over speaker phone. 40% fio3  Yesterday - daughter [name] -> indicated no tracheostomy to PCCM MD. And per RN Kenney Houseman -> other daughter Ron Parker also indicated no tracheostomy but at same tme they are indicating full code and full medical care  11/19/15 -  No further hemoptysis.  Got 1 unit prbc overnight.   Per RN  Janett Billow - reports of overnigth intermittent agitation but currently RASS -3 on diprivan gtt. Per Daugther haifa (non dpoa)- patient was without agitation till 3am and is upset that RN reporting that there was agitation. [name] does NOT want RN Wells Guiles taking care of patient Unclear to MD if family member upset that patient is sedated or she got agitated. Not on pressors.   BUN continues to climb - renal follwing. Duplex LE - negative for DVT. Path from 11/17/15 - shows blood clot; no malignant cells.    SUBJECTIVE/OVERNIGHT/INTERVAL HX Remain son vent, no leak on CT  VITAL SIGNS: BP 165/52 mmHg  Pulse 86  Temp(Src) 98.8 F (37.1 C) (Oral)  Resp 16  Ht 5\' 2"  (1.575 m)  Wt 87 kg (191 lb 12.8 oz)  BMI 35.07 kg/m2  SpO2 100%  HEMODYNAMICS: CVP:  [6 mmHg-14 mmHg] 9 mmHg  VENTILATOR  SETTINGS: Vent Mode:  [-] PSV;CPAP FiO2 (%):  [40 %] 40 % Set Rate:  [16 bmp] 16 bmp Vt Set:  [400 mL] 400 mL PEEP:  [5 cmH20] 5 cmH20 Pressure Support:  [15 cmH20] 15 cmH20 Plateau Pressure:  [19 cmH20-21 cmH20] 21 cmH20  INTAKE / OUTPUT: I/O last 3 completed shifts: In: 3578.7 [I.V.:2408.7; NG/GT:770; IV Piggyback:400] Out: 2120 [Urine:2090; Chest Tube:30]  PHYSICAL EXAMINATION: General: Chronically ill appearing elderly female, on vent. LOOKS DECONDITIONED and CRIICALLY ILL.  Neuro:  RASS 0, FC HEENT:no jvd   Cardiovascular:  RRR, Nl S1/S2, -M/R/G. Lungs:  CTA, left base distant, Left chest tube no leak Abdomen: Soft, NT, ND and +BS. Ext: 1+ edema and -tenderness. Skin: Intact.   LABS:  PULMONARY  Recent Labs Lab 11/15/15 0710 11/16/15 0435 11/17/15 0145 11/17/15 0400 11/18/15 0400  PHART  --   --  7.445  --   --   PCO2ART  --   --  46.8*  --   --   PO2ART  --   --  194.0*  --   --   HCO3  --   --  32.1*  --   --   TCO2  --   --  34  --   --   O2SAT 70.5 70.4 100.0 62.3 83.3    CBC  Recent Labs Lab 11/19/15 1503 11/20/15 0445 11/21/15 0430  HGB 8.0* 8.5* 8.4*  HCT 26.8* 27.2* 28.5*  WBC 7.7 6.8 4.0  PLT 137* 132* 155    COAGULATION  Recent Labs Lab 11/17/15 0035 11/19/15 0415  INR 1.18 1.13    CARDIAC    Recent Labs Lab 11/17/15 0345 11/17/15 0931 11/17/15 1800  TROPONINI 1.74* 2.00* 1.66*   No results for input(s): PROBNP in the last 168 hours.   CHEMISTRY  Recent Labs Lab 11/17/15 0345 11/18/15 0422 11/19/15 0415 11/20/15 0445 11/21/15 0430  NA 143 142 145 147* 147*  K 3.8 3.1* 3.4* 3.6 3.5  CL 99* 101 107 113* 115*  CO2 GLUCOSE 135* 132* 152* 146* 165*  BUN 101* 100* 107* 99* 100*  CREATININE 1.00 1.03* 0.99 1.00 0.92  CALCIUM 8.6* 8.8* 8.6* 8.2* 8.4*  MG 2.4 2.3 2.1 2.2 2.3  PHOS 5.1* 4.4 4.5 4.0 3.6   Estimated Creatinine Clearance: 48.3 mL/min (by C-G formula based on Cr of 0.92).   LIVER  Recent Labs Lab 11/17/15 0035 11/19/15 0415  AST  --  67*  ALT  --  36  ALKPHOS  --  82  BILITOT  --  0.7  PROT  --  5.2*  ALBUMIN  --  2.1*  INR 1.18 1.13     INFECTIOUS  Recent Labs Lab 11/18/15 1122 11/19/15 11/19/15 0415 11/20/15 0445 11/21/15 0057  LATICACIDVEN  --  1.8  --   --  1.7  PROCALCITON 1.73  --  1.36 0.89  --      ENDOCRINE CBG (last 3)   Recent Labs  11/20/15 1906 11/21/15 0008 11/21/15 0431  GLUCAP 176* 165* 146*         IMAGING x48h  - image(s) personally visualized   -   highlighted in bold Dg Chest Port 1 View  11/21/2015  CLINICAL DATA:  Acute respiratory failure, coronary artery disease, CHF. EXAM: PORTABLE CHEST 1 VIEW COMPARISON:  Portable chest x-ray of November 20, 2015 FINDINGS: The lungs are reasonably well inflated. Pleural fluid is present at the right lung base. There is  bibasilar atelectasis or pneumonia. The cardiac silhouette remains enlarged. The pulmonary vascularity is less engorged bilaterally. External pacemaker defibrillator pads are present. There are post CABG changes. Small caliber left chest tube is again demonstrated on the left with some of the side ports lying outside the chest. The endotracheal tube tip lies approximately 3.2 cm above the carina. The esophagogastric tube tip projects below the inferior margin of the image. The left internal jugular venous catheter tip projects over the midportion of the SVC. For recurrence mid words IMPRESSION: Bibasilar atelectasis and small right pleural effusion. CHF mildly improved. The support tubes are in reasonable position as described. Correlation as to the positioning of the small caliber tube over the left lower lateral chest wall is needed. Electronically Signed   By: David  Martinique M.D.   On: 11/21/2015 07:17   Dg Chest Port 1 View  11/20/2015  CLINICAL DATA:  82 year old female with respiratory failure. EXAM: PORTABLE CHEST 1 VIEW COMPARISON:  11/19/2015 and prior exams FINDINGS: Cardiomegaly, CABG changes and defibrillator pad overlying the left chest again noted. An endotracheal tube with tip 2.2 cm above the carina, NG tube with tip overlying the proximal stomach, left thoracostomy tube and left IJ central venous catheter with tip overlying the lower SVC again noted. There is no evidence of pneumothorax. Continued bilateral lower lung opacities/atelectasis/pleural effusions and pulmonary vascular congestion noted. IMPRESSION: Little significant change with support apparatus and bilateral lower  lung opacities again noted. Electronically Signed   By: Margarette Canada M.D.   On: 11/20/2015 09:48   Dg Chest Port 1 View  11/19/2015  CLINICAL DATA:  Atelectasis. EXAM: PORTABLE CHEST 1 VIEW COMPARISON:  11/19/2015 FINDINGS: The ETT is in good position. Other support apparatus is stable. No pneumothorax. Layering effusion and associated atelectasis in the right base is stable. No other changes. IMPRESSION: Stable support apparatus. Mild effusion and atelectasis in the right base. Electronically Signed   By: Dorise Bullion III M.D   On: 11/19/2015 18:05    ASSESSMENT / PLAN:  PULMONARY A: Hypercarbic respiratory failure - in setting of CHF, effusions and LLL PNA,  - 2/14 - failed bipap and intubated  - 11/16/15 - failed extubation with onset of hemoptysis and left ptx and reintubated 2/23.   - CT 2/23 - RML and RLL process. DUplex LE 2/24 - neg dvt  - Vasculitis and autoimmune negative 11/17/15 except ANA 1:80 and trace positive (non specific)   - 11/20/15 ->  Hemoptysis again x 2 in 16h. Meets indication for bronch (hemoptysis)  and trach (prolonged mech vent)   P:   Required trach, will d/w family ASAP, waiting till middle week will cause more harm and risk Neg balance goals CT to suction, no ptx, consider to water seal and repeat pcxr Weaning cpap but requires PS 15- requires trach if aggressive measures sought  CARDIOVASCULAR A:  Coronary artery disease. S/p multiple stents and CABG. Hx HTN, CHF  P:  Neg balance goals tele  RENAL A:   #baseline   - bun 20/creat 1 hypernatremia #currently AKI.   bun worsening. FOBT x 2 negative this admit and neg x 1 in dec 2016. Autimmune negative 11/18/15  - bun improving 2/26. Making urine P:   Ensure free water NA in am  Consider lasix further k supp  GASTROINTESTINAL A:   GI prophylaxis. Nutrition. Ammonia elevated, likely hepatic congestion. Constipation    -  tF - on low protein since 11/18/15 due to high bun.  P:   SUP:  pantoprazole. TF - low protein Dc - Senakot 2 tablets BID and Miralax on 2/25 (soft stools)  HEMATOLOGIC  Recent Labs Lab 11/19/15 1503 11/20/15 0445 11/21/15 0430  HGB 8.0* 8.5* 8.4*  HCT 26.8* 27.2* 28.5*  WBC 7.7 6.8 4.0  PLT 137* 132* 155    A:   Anemia. Thrombocytopenia resolving VTE prophylaxis.   - got 1 unit PRBc 2/2/1 - FOBT x 2 negative and repeat prbc 11/18/15  P:  Ferrous sulfate since 11/16/15 per family request - dc can worsen outcomes in ICU Transfuse for Hgb < 7. Monitor platelet counts. SCD's. Monitor CBC coags wnl, add sub q heparin  INFECTIOUS A:   Recent UTI and LLL PNA. S/p azactam ending 11/15/15 Pct low  - no fever 11/18/15. Restarted abx 11/18/15 due to  recurrent hemoptysis . Normal flora 2/23. Path clot  2/23 - only blood   P:   Cultures as above   vanc 2/24 >> 2/26  aztreonam 11/18/15 > consider course for rt base on CT 23rd  ENDOCRINE A:   Diabetes mellitus. P:   SSI.  NEUROLOGIC A:   #Baseline   - old strokes . Bed bound x 2009 but normal conversation. In diapers  deconditioned  P Prn sedation RASS goal: 0 Daily WUA. Continue Primidone for parkinson (once daily for Estimated Creatinine Clearance: 48.3 mL/min (by C-G formula based on Cr of 0.92).) - if    FAMILY  - Updates: No family available am 2/19.  Daughter at bedside updated.DPOA Raja updated on phone 11/14/15  - Inter-disciplinary family meet or Palliative Care meeting due by:  2/21. - done with dpoa Raja and RN Scarlette Calico at bedside 11/15/15 - daughter body language is of full code + full medical care. Discussed IDT again with RN Scarlette Calico -> DPOA says "this is my mom and you have to do everything". Reintubation ok.  Dont again 11/17/15 with RN Archie Patten and DPOA Angelyn Punt  -> explained heading into chronic critical lillnesss and implications of trach, ltac and uncertainties v comfort approach. Daughter is not sure. Feels LTAC approach is not helpful but does not want to give up on mom.  Will need collective family input as well. She has agreed to meet pall care    - IDT meet again with Thelma Barge with RN Archie Patten and daughter in law of patient Analysa Nutting an RN  Who was on speaker phone - long conversation covering all of above issues. Amal got upset that I was talking to her like a doctor would to a layman . I apologized and explained though I knew she was RN bcause it was on requested speaker phone I had to balance language so that DPOA Raja could also understand. Kathlyn [place] was very concerned that we were not doing anything and proposed discussion with palliative care about trach/ltach was being done prematurely and interim CCM was not doing anything for patient. I explained that this only about a conversation due to high pretest prob for indication of trach/ltac and explained all above interventions being done.   - Updated daughter [place] at bedside 11/19/15 -. Pall care meeting 11/20/15 - to discuss options - patient heading to trach   - updated daugther Raja DPOA 11/20/15 - pall care meeting for goals  GLOBAL Full code. I will meet with family asap hope by am   Ccm time 35 min   Mcarthur Rossetti. Tyson Alias, MD, FACP Pgr: [phone] [facility]

## 2015-11-21 NOTE — Progress Notes (Signed)
Patient awake, patients daughter states patient is complaining of pain from breathing tube yet patient per daughter refuses any more pain medication at this time. Will continue to monitor.

## 2015-11-21 NOTE — Progress Notes (Signed)
eLink Physician-Brief Progress Note Patient Name: [name] DOB: 04-09-1931 MRN: [id number]   Date of Service  11/21/2015  HPI/Events of Note  Patient having frequent PVC's and ventricular bigeminy. K+ repleted earlier today.   eICU Interventions  Will check BMP and Mg++.      Intervention Category Major Interventions: Electrolyte abnormality - evaluation and management Intermediate Interventions: Arrhythmia - evaluation and management  Criag Wicklund Eugene 11/21/2015, 4:15 PM

## 2015-11-21 NOTE — Progress Notes (Signed)
Patients daughter states breathing tube hurts her mothers throat, CPOT scale evaluated, patients daughter states I need to give her something for pain because patient is in pain and she is unable to get any sleep and she states she needs sleep. Fentanyl IV given. Continue to monitor closely

## 2015-11-22 ENCOUNTER — Inpatient Hospital Stay (HOSPITAL_COMMUNITY): Payer: Medicare Other

## 2015-11-22 DIAGNOSIS — J9811 Atelectasis: Secondary | ICD-10-CM | POA: Insufficient documentation

## 2015-11-22 LAB — COMPREHENSIVE METABOLIC PANEL
ALBUMIN: 2.2 g/dL — AB (ref 3.5–5.0)
ALT: 33 U/L (ref 14–54)
ANION GAP: 9 (ref 5–15)
AST: 61 U/L — ABNORMAL HIGH (ref 15–41)
Alkaline Phosphatase: 91 U/L (ref 38–126)
BILIRUBIN TOTAL: 0.6 mg/dL (ref 0.3–1.2)
BUN: 103 mg/dL — ABNORMAL HIGH (ref 6–20)
CO2: 22 mmol/L (ref 22–32)
Calcium: 8.4 mg/dL — ABNORMAL LOW (ref 8.9–10.3)
Chloride: 112 mmol/L — ABNORMAL HIGH (ref 101–111)
Creatinine, Ser: 1 mg/dL (ref 0.44–1.00)
GFR, EST AFRICAN AMERICAN: 59 mL/min — AB (ref >60)
GFR, EST NON AFRICAN AMERICAN: 51 mL/min — AB (ref >60)
GLUCOSE: 184 mg/dL — AB (ref 65–99)
POTASSIUM: 3.7 mmol/L (ref 3.5–5.1)
Sodium: 143 mmol/L (ref 135–145)
TOTAL PROTEIN: 5.5 g/dL — AB (ref 6.5–8.1)

## 2015-11-22 LAB — CBC WITH DIFFERENTIAL/PLATELET
BASOS ABS: 0 10*3/uL (ref 0.0–0.1)
Basophils Relative: 0 %
EOS PCT: 0 %
Eosinophils Absolute: 0 10*3/uL (ref 0.0–0.7)
HCT: 28.9 % — ABNORMAL LOW (ref 36.0–46.0)
HEMOGLOBIN: 8.5 g/dL — AB (ref 12.0–15.0)
LYMPHS ABS: 0.7 10*3/uL (ref 0.7–4.0)
LYMPHS PCT: 9 %
MCH: 26.9 pg (ref 26.0–34.0)
MCHC: 29.4 g/dL — AB (ref 30.0–36.0)
MCV: 91.5 fL (ref 78.0–100.0)
Monocytes Absolute: 0.2 10*3/uL (ref 0.1–1.0)
Monocytes Relative: 3 %
NEUTROS PCT: 88 %
Neutro Abs: 6.7 10*3/uL (ref 1.7–7.7)
Platelets: 163 10*3/uL (ref 150–400)
RBC: 3.16 MIL/uL — AB (ref 3.87–5.11)
RDW: 19.9 % — AB (ref 11.5–15.5)
WBC: 7.5 10*3/uL (ref 4.0–10.5)

## 2015-11-22 LAB — GLUCOSE, CAPILLARY
GLUCOSE-CAPILLARY: 170 mg/dL — AB (ref 65–99)
GLUCOSE-CAPILLARY: 167 mg/dL — AB (ref 65–99)
GLUCOSE-CAPILLARY: 152 mg/dL — AB (ref 65–99)
Glucose-Capillary: 153 mg/dL — ABNORMAL HIGH (ref 65–99)
Glucose-Capillary: 160 mg/dL — ABNORMAL HIGH (ref 65–99)
Glucose-Capillary: 177 mg/dL — ABNORMAL HIGH (ref 65–99)

## 2015-11-22 LAB — PHOSPHORUS: PHOSPHORUS: 3.6 mg/dL (ref 2.5–4.6)

## 2015-11-22 LAB — MAGNESIUM: Magnesium: 2.2 mg/dL (ref 1.7–2.4)

## 2015-11-22 LAB — TRIGLYCERIDES: TRIGLYCERIDES: 91 mg/dL (ref <150)

## 2015-11-22 MED ORDER — FLUCONAZOLE IN SODIUM CHLORIDE 100-0.9 MG/50ML-% IV SOLN
100.0000 mg | INTRAVENOUS | Status: AC
  Administered 2015-11-22 – 2015-11-23 (×2): 100 mg via INTRAVENOUS
  Filled 2015-11-22 (×2): qty 50

## 2015-11-22 MED ORDER — NEPRO/CARBSTEADY PO LIQD
1000.0000 mL | ORAL | Status: DC
  Administered 2015-11-22: 1000 mL
  Filled 2015-11-22 (×4): qty 1000

## 2015-11-22 MED ORDER — NYSTATIN 100000 UNIT/ML MT SUSP
5.0000 mL | Freq: Four times a day (QID) | OROMUCOSAL | Status: DC
  Administered 2015-11-22 – 2015-11-25 (×7): 500000 [IU] via ORAL
  Filled 2015-11-22 (×10): qty 5

## 2015-11-22 NOTE — Progress Notes (Addendum)
PULMONARY / CRITICAL CARE MEDICINE   Name: [name] MRN: [id number] DOB: 09-19-1931    ADMISSION DATE:  11/07/2015 CONSULTATION DATE:  11/08/15 LOS 43 das  REFERRING MD: Cyndia Diver, A  CHIEF COMPLAINT:  Altered mental status, hypercarbic resp failure.   CULTURES: 11/17/15 - resp culture - normal flora  ANTIBIOTICS: Vanc 02/14 >> 2/16, 2/24 >>2/26 Azactam 02/14 >> 2/21, 2/24 >>   LINES/TUBES: ETT 02/14>>> L IJ TLC 2/14>> Left chest tube 2/23 >>water seal 2/27>>>  SIGNIFICANT EVENTS: CT head 2/13. Chronic age-related changes, old infarcts. No acute abnormality. MRI head 2/13. No acute intracranial process ................................ 2/14  Admit with altered mental status, hypercarbic, failed bipap / intubated - Chest x-ray 2/14 bilateral pleural effusions, basilar opacities 2/17  Remains on lasix gtt, precedex  2/19 :  No acute events overnight.  Pt more alert/awake this am.  Follows commands (when demonstrated first)   11/14/15- pn prn fentanyl. Just started sbt . Daughter at bedside. RN reports patient exhausted and edematous and sometimes agitated . Restart scheduled lasix   11/15/15 - ET tube too low. Got 1 unit prbc for hg < 7gm%. Stool OB x 1 negative. Failed SBT. Lasix held by cards due o cxr improvement and cVP 5. Improving creat but worsening BUN to 90s. Echo pending.  Daughte rreports patien bed bound and in diapers since 2009 but normal mental status . Mild dysphagia at baseline for hard solids +  11/16/15 - EF 35%. CVP up again and cards gave 1 dose lasix. BUN still high 80s. CXr stil congested. Doing SBT x 3h off precedex. Normal WUA per RN. Looks exhausted . FOBT x 2 ngative . DPOA indicating full code + full med care  11/17/15 - massive hemoptysis post extubation - new finding with emergent bronch and intubation and s/p l;eft ches ttube for pneumothorax. This AM -> on prn sedation. Not on pressors. BUN continues to rise. No hemoptysis this AM  11/18/15 -   On chart reviw daughter Ron Parker showing signs of spiritical distress - yelling at RN and feeling mom not being taken care of well. GBM an RF negative yesterd. BUn/creat rising. Mild intermittent hemoptysis + via ETT tube. Daughter [name] DPOA at bedside. Had me update patient daughter in law Ivan Croft RN over speaker phone. 40% fio3  Yesterday - daughter [name] -> indicated no tracheostomy to PCCM MD. And per RN Kenney Houseman -> other daughter Ron Parker also indicated no tracheostomy but at same tme they are indicating full code and full medical care  11/19/15 -  No further hemoptysis.  Got 1 unit prbc overnight.   Per RN  Janett Billow - reports of overnigth intermittent agitation but currently RASS -3 on diprivan gtt. Per Daugther haifa (non dpoa)- patient was without agitation till 3am and is upset that RN reporting that there was agitation. [name] does NOT want RN Wells Guiles taking care of patient Unclear to MD if family member upset that patient is sedated or she got agitated. Not on pressors.   BUN continues to climb - renal follwing. Duplex LE - negative for DVT. Path from 11/17/15 - shows blood clot; no malignant cells.    SUBJECTIVE/OVERNIGHT/INTERVAL HX Some weaning done Family delayed meeting to wed  VITAL SIGNS: BP 149/69 mmHg  Pulse 76  Temp(Src) 98.7 F (37.1 C) (Oral)  Resp 18  Ht '5\' 2"'$  (1.575 m)  Wt 87 kg (191 lb 12.8 oz)  BMI 35.07 kg/m2  SpO2 100%  HEMODYNAMICS: CVP:  [8 mmHg] 8 mmHg  VENTILATOR SETTINGS: Vent Mode:  [-] PSV;CPAP FiO2 (%):  [40 %] 40 % Set Rate:  [16 bmp] 16 bmp Vt Set:  [400 mL] 400 mL PEEP:  [5 cmH20] 5 cmH20 Pressure Support:  [5 cmH20-10 cmH20] 10 cmH20 Plateau Pressure:  [19 cmH20-23 cmH20] 20 cmH20  INTAKE / OUTPUT: I/O last 3 completed shifts: In: 4180 [I.V.:2430; NG/GT:1500; IV Piggyback:250] Out: 1995 [Urine:1700; Chest Tube:295]  PHYSICAL EXAMINATION: General: weak Neuro:  RASS 0, FC, eyes open HEENT:no jvd  Cardiovascular:  RRR, Nl S1/S2 -M Lungs:   CTA, left base coarse, Left chest tube no leak Abdomen: Soft, NT, ND and +BS. Ext: 1+ edema and -tenderness. Skin: Intact.   LABS:  PULMONARY  Recent Labs Lab 11/16/15 0435 11/17/15 0145 11/17/15 0400 11/18/15 0400  PHART  --  7.445  --   --   PCO2ART  --  46.8*  --   --   PO2ART  --  194.0*  --   --   HCO3  --  32.1*  --   --   TCO2  --  34  --   --   O2SAT 70.4 100.0 62.3 83.3    CBC  Recent Labs Lab 11/20/15 0445 11/21/15 0430 11/22/15 0400  HGB 8.5* 8.4* 8.5*  HCT 27.2* 28.5* 28.9*  WBC 6.8 4.0 7.5  PLT 132* 155 163    COAGULATION  Recent Labs Lab 11/17/15 0035 11/19/15 0415  INR 1.18 1.13    CARDIAC    Recent Labs Lab 11/17/15 0345 11/17/15 0931 11/17/15 1800  TROPONINI 1.74* 2.00* 1.66*   No results for input(s): PROBNP in the last 168 hours.   CHEMISTRY  Recent Labs Lab 11/18/15 0422 11/19/15 0415 11/20/15 0445 11/21/15 0430 11/21/15 1515 11/22/15 0400  NA 142 145 147* 147* 149* 143  K 3.1* 3.4* 3.6 3.5 3.9 3.7  CL 101 107 113* 115* 115* 112*  CO2 '28 26 24 23 '$ 21* 22  GLUCOSE 132* 152* 146* 165* 191* 184*  BUN 100* 107* 99* 100* 100* 103*  CREATININE 1.03* 0.99 1.00 0.92 0.95 1.00  CALCIUM 8.8* 8.6* 8.2* 8.4* 8.7* 8.4*  MG 2.3 2.1 2.2 2.3 2.3 2.2  PHOS 4.4 4.5 4.0 3.6  --  3.6   Estimated Creatinine Clearance: 44.4 mL/min (by C-G formula based on Cr of 1).   LIVER  Recent Labs Lab 11/17/15 0035 11/19/15 0415 11/22/15 0400  AST  --  67* 61*  ALT  --  36 33  ALKPHOS  --  82 91  BILITOT  --  0.7 0.6  PROT  --  5.2* 5.5*  ALBUMIN  --  2.1* 2.2*  INR 1.18 1.13  --      INFECTIOUS  Recent Labs Lab 11/18/15 1122 11/19/15 11/19/15 0415 11/20/15 0445 11/21/15 0057  LATICACIDVEN  --  1.8  --   --  1.7  PROCALCITON 1.73  --  1.36 0.89  --      ENDOCRINE CBG (last 3)   Recent Labs  11/22/15 0100 11/22/15 0420 11/22/15 0837  GLUCAP 170* 167* 153*         IMAGING x48h  - image(s) personally  visualized  -   highlighted in bold Dg Chest Port 1 View  11/22/2015  CLINICAL DATA:  Intubation. EXAM: PORTABLE CHEST 1 VIEW COMPARISON:  11/21/2015.  11/21/2015.  11/20/2015. FINDINGS: Left chest tube side holes remain partially outside of the left chest cavity Endotracheal tube, NG tube, left IJ line in stable position. No pneumothorax. Cardiomegaly  with pulmonary interstitial prominence and bilateral effusions consistent with congestive heart failure. Interstitial edema has progressed from prior exam. Small bilateral pleural effusions. IMPRESSION: 1. Left chest tube in unchanged position with sideholes partially outside of the left chest cavity. No pneumothorax. 2. Remaining lines and tubes in stable position. 3. Prior CABG. Cardiomegaly with slight increase in bilateral pulmonary interstitial edema. Bilateral pleural effusions again noted. Findings consistent with congestive heart failure. These results will be called to the ordering clinician or representative by the Radiologist Assistant, and communication documented in the PACS or zVision Dashboard. Electronically Signed   By: Marcello Moores  Register   On: 11/22/2015 07:14   Dg Chest Port 1 View  11/21/2015  CLINICAL DATA:  Pneumothorax EXAM: PORTABLE CHEST 1 VIEW COMPARISON:  11/21/2015 FINDINGS: Cardiomediastinal silhouette is stable. Again noted status post median sternotomy. Stable endotracheal tube, NG tube and left IJ central line position. There is no pneumothorax. Again noted mild congestion/ pulmonary edema without significant change in aeration. Stable bilateral small pleural effusion. Again noted a small caliber left lower lateral chest tube with some of the side ports outside rib cage cavity. There is no significant change in position from prior exam. Mild basilar atelectasis again noted. IMPRESSION: Again noted status post median sternotomy. Stable endotracheal tube, NG tube and left IJ central line position. There is no pneumothorax. Again noted  mild congestion/ pulmonary edema without significant change in aeration. Stable bilateral small pleural effusion. Again noted a small caliber left lower lateral chest tube with some of the side ports outside rib cage cavity. There is no significant change in position from prior exam. Electronically Signed   By: Lahoma Crocker M.D.   On: 11/21/2015 15:30   Dg Chest Port 1 View  11/21/2015  CLINICAL DATA:  Acute respiratory failure, coronary artery disease, CHF. EXAM: PORTABLE CHEST 1 VIEW COMPARISON:  Portable chest x-ray of November 20, 2015 FINDINGS: The lungs are reasonably well inflated. Pleural fluid is present at the right lung base. There is bibasilar atelectasis or pneumonia. The cardiac silhouette remains enlarged. The pulmonary vascularity is less engorged bilaterally. External pacemaker defibrillator pads are present. There are post CABG changes. Small caliber left chest tube is again demonstrated on the left with some of the side ports lying outside the chest. The endotracheal tube tip lies approximately 3.2 cm above the carina. The esophagogastric tube tip projects below the inferior margin of the image. The left internal jugular venous catheter tip projects over the midportion of the SVC. For recurrence mid words IMPRESSION: Bibasilar atelectasis and small right pleural effusion. CHF mildly improved. The support tubes are in reasonable position as described. Correlation as to the positioning of the small caliber tube over the left lower lateral chest wall is needed. Electronically Signed   By: David  Martinique M.D.   On: 11/21/2015 07:17   Dg Chest Port 1 View  11/20/2015  CLINICAL DATA:  82 year old female with respiratory failure. EXAM: PORTABLE CHEST 1 VIEW COMPARISON:  11/19/2015 and prior exams FINDINGS: Cardiomegaly, CABG changes and defibrillator pad overlying the left chest again noted. An endotracheal tube with tip 2.2 cm above the carina, NG tube with tip overlying the proximal stomach, left  thoracostomy tube and left IJ central venous catheter with tip overlying the lower SVC again noted. There is no evidence of pneumothorax. Continued bilateral lower lung opacities/atelectasis/pleural effusions and pulmonary vascular congestion noted. IMPRESSION: Little significant change with support apparatus and bilateral lower lung opacities again noted. Electronically Signed   By:  Margarette Canada M.D.   On: 11/20/2015 09:48    ASSESSMENT / PLAN:  PULMONARY A: Hypercarbic respiratory failure - in setting of CHF, effusions and LLL PNA,  - 2/14 - failed bipap and intubated  - 11/16/15 - failed extubation with onset of hemoptysis and left ptx and reintubated 2/23.   - CT 2/23 - RML and RLL process. DUplex LE 2/24 - neg dvt  - Vasculitis and autoimmune negative 11/17/15 except ANA 1:80 and trace positive (non specific)   - 11/20/15 ->  Hemoptysis again x 2 in 16h. Meets indication for bronch (hemoptysis)  and trach (prolonged mech vent) pulm edema 2/28  P:   Requires trach given duration and reintubation needs , global deconditioning status vs comfort care Keep ct to water seal, no ptx, remain Neg balance needed was pos 2.8 liters further and pcxr worsening edema dissapointed family delayed even conversation on trach, poor pt suffering on vent longer and risk VAP increased each day on vent, calling ENT today to discuss expediting OR time in cass they choose trach  CARDIOVASCULAR A:  Coronary artery disease. S/p multiple stents and CABG. Hx HTN, CHF  P:  Neg balance goals not met tele Na improved, consider lasix today  RENAL A:   #baseline   - bun 20/creat 1 hypernatremia #currently AKI.   bun worsening. FOBT x 2 negative this admit and neg x 1 in dec 2016. Autimmune negative 11/18/15  - bun improving 2/26. Making urine P:   Ensure free water Na better Consider lasix soon, was pos 2.8 liters Per renal  GASTROINTESTINAL A:   GI prophylaxis. Nutrition. Ammonia elevated, likely  hepatic congestion. Constipation  Neg cdiff P:   SUP: pantoprazole. TF - low protein  HEMATOLOGIC  Recent Labs Lab 11/20/15 0445 11/21/15 0430 11/22/15 0400  HGB 8.5* 8.4* 8.5*  HCT 27.2* 28.5* 28.9*  WBC 6.8 4.0 7.5  PLT 132* 155 163    A:   Anemia. Thrombocytopenia resolving VTE prophylaxis. P:  SCD's. Monitor CBC coags wnl sub q heparin  INFECTIOUS A:   Recent UTI and LLL PNA. S/p azactam ending 11/15/15 Pct low  - no fever 11/18/15. Restarted abx 11/18/15 due to  recurrent hemoptysis . Normal flora 2/23. Path clot  2/23 - only blood   P:    aztreonam 11/18/15 > to stop date  ENDOCRINE A:   Diabetes mellitus. P:   SSI.  NEUROLOGIC A:   #Baseline   - old strokes . Bed bound x 2009 but normal conversation. In diapers  deconditioned  P Prn sedation RASS goal: 0 Daily WUA. Continue Primidone  Ccm time 30 min   Lavon Paganini. Titus Mould, MD, [place] Pgr: [facility]      EXTENSIVE d/w daughter in law , fully informed Ccm time now 19 min   Lavon Paganini. Titus Mould, MD, [place] Pgr: [facility]

## 2015-11-22 NOTE — Progress Notes (Signed)
Amal (Dgt. In-law) called at 12:00 p.m. and was very aggressive with tone of voice as well as questioning the care of myself and the staff.  Charge nurse Jeanice Lim) was present during phone conversation.  Amal stated "Hafa called me several times about my mother in-law".  She questioned VSS, sedation, and ventilator information and requested that I "look in the chart".  I explained the plan of care and that I was at the bedside with the patient tending to her immediate need when she attempted to call.  She said "do I need to leave work to come up there.?!"  I indicated I had recently given her sedation and called Respiratory Therapy to the bedside to assist with care as well and that currently the patient needed to rest due to the sedation medicine to help her relax because of agitation and the need for family to allow her to rest as well. Also, Hafa asked "What did Dr. Tyson Alias) say.  I restated the conversation of the morning with Dr. Tyson Alias and indicated the family meeting 3/1 at 3 p.m. Hafa stated "I don't understand what is going on". Nursing Interim Director was made aware of the events/conversation.

## 2015-11-22 NOTE — Progress Notes (Signed)
Pharmacy Antibiotic Note  [name] is a 82 y.o. female admitted on 11/07/2015 with pneumonia.  Pharmacy has been consulted for aztreonam dosing.  Plan: - Aztreonam 1g IV q 8 hrs. (stop date entered) - F/U clinical course    Height:  (157.5 cm) Weight: 191 lb 12.8 oz (87 kg) IBW/kg (Calculated) : 50.1  Temp (24hrs), Avg:98.2 F (36.8 C), Min:97.6 F (36.4 C), Max:98.7 F (37.1 C)   Recent Labs Lab 11/18/15 1518 11/19/15 11/19/15 0415 11/19/15 1503 11/20/15 0445 11/21/15 0057 11/21/15 0430 11/21/15 1515 11/22/15 0400  WBC  --   --  5.6 7.7 6.8  --  4.0  --  7.5  CREATININE  --   --  0.99  --  1.00  --  0.92 0.95 1.00  LATICACIDVEN  --  1.8  --   --   --  1.7  --   --   --   VANCORANDOM 31  --   --   --   --   --   --   --   --     Estimated Creatinine Clearance: 44.4 mL/min (by C-G formula based on Cr of 1).    Allergies  Allergen Reactions  . Penicillins Anaphylaxis, Itching and Swelling    Has patient had a PCN reaction causing immediate rash, facial/tongue/throat swelling, SOB or lightheadedness with hypotension: Yes Has patient had a PCN reaction causing severe rash involving mucus membranes or skin necrosis: No  Has patient had a PCN reaction that required hospitalization: Unknown Has patient had a PCN reaction occurring within the last 10 years: No  If all of the above answers are "NO", then may proceed with Cephalosporin use.     Antimicrobials this admission: 2/14 aztreonam > 2/21  2/14 vanc >2/15 Resumed 2/24 > 2/26  2/24 VR: 31 (at 1518, vanc dose hung at 1300)  2/14 doxy >2/14  2/24 azactam  2/28 Fluconazole>>[2/29]  Microbiology results: 2/14 blood cx: ngf  2/14 urine cx: ngf  2/24 Ucx yeast > 100K (fluconazole)  2/24 trach ngF  Thank you for allowing pharmacy to be a part of this patient's care.  Comfort Iversen C. Marvis Moeller, PharmD Pharmacy Resident  Pager: [phone] 11/22/2015 11:44 AM

## 2015-11-22 NOTE — Progress Notes (Signed)
Nutrition Follow-up  DOCUMENTATION CODES:   Obesity unspecified  INTERVENTION:  Increase NePro to 74m/hr Maintain Pro-Stat @ 650mTID TF regimen provides 1032 calories, 110g protein, 174 cc free h2o   NUTRITION DIAGNOSIS:   Inadequate oral intake related to inability to eat as evidenced by NPO status. ongoing  GOAL:   Provide needs based on ASPEN/SCCM guidelines Meeting currently  MONITOR:   Vent status, I & O's, TF tolerance  REASON FOR ASSESSMENT:   Consult Enteral/tube feeding initiation and management  ASSESSMENT:   Pa[place]emale with hx of DM, stroke, HTN, CHF (EF 50%) was admitted with encephalopathy. [facility] Pt with respiratory failure in setting of CHF, effusions and LLL PNA who failed BiPAP, intubated 2/14.  Patient is currently intubated on ventilator support MV: 11 L/min Temp (24hrs), Avg:98.2 F (36.8 C), Min:97.6 F (36.4 C), Max:98.7 F (37.1 C)  Propofol: None  Propofol has been weaned. Pt's needs recalculated for current ventilator settings.  Per nephrology, trach is planned for pt. Her BUN continues to be elevated out of proportion to creatinine, but is improving. They believe it will resolve slowly with gentle IV fluids. Continue to follow for TF Mgmt.  Labs: BUN 103, Ca 8.4, EGFR 51, CBGs:157-170 Medications: Solumedrol, Apresoline, Versed & Lopressor PRN  Diet Order:  Diet NPO time specified  Skin:  Wound (see comment) (stage II - 3 dime sized ulcers on sacrum)  Last BM:  2/23  Height:   Ht Readings from Last 1 Encounters:  11/20/15 '5\' 2"'$  (1.575 m)    Weight:   Wt Readings from Last 1 Encounters:  11/20/15 191 lb 12.8 oz (87 kg)    Ideal Body Weight:  50 kg  BMI:  Body mass index is 35.07 kg/(m^2).  Estimated Nutritional Needs:   Kcal:  [address]Protein:  > 100 grams  Fluid:  > 1.5 L/day  EDUCATION NEEDS:   No education needs identified at this  time  WiSatira AnisWard, MS, RD LDN After Hours/Weekend Pager [phone]

## 2015-11-22 NOTE — Progress Notes (Signed)
[facility] ROUNDING NOTE   Subjective:   Interval History:  Plan for tracheostomy   Objective:  Vital signs in last 24 hours:  Temp:  [97.6 F (36.4 C)-98.7 F (37.1 C)] 98.7 F (37.1 C) (02/28 0400) Pulse Rate:  [29-102] 47 (02/28 1000) Resp:  [14-31] 20 (02/28 1000) BP: (87-179)/(39-94) 179/46 mmHg (02/28 1000) SpO2:  [100 %] 100 % (02/28 1000) FiO2 (%):  [40 %] 40 % (02/28 1000)  Weight change:  Filed Weights   11/15/15 0434 11/16/15 0500 11/20/15 1200  Weight: 84.8 kg (186 lb 15.2 oz) 85.9 kg (189 lb 6 oz) 87 kg (191 lb 12.8 oz)    Intake/Output: I/O last 3 completed shifts: In: 4180 [I.V.:2430; NG/GT:1500; IV Piggyback:250] Out: 1995 [Urine:1700; Chest Tube:295]   Intake/Output this shift:  Total I/O In: 240 [I.V.:225; NG/GT:15] Out: -   CVS- RRR RS- CTA intubated  ABD- BS present soft non-distended EXT- no edema   Basic Metabolic Panel:  Recent Labs Lab 11/18/15 0422 11/19/15 0415 11/20/15 0445 11/21/15 0430 11/21/15 1515 11/22/15 0400  NA 142 145 147* 147* 149* 143  K 3.1* 3.4* 3.6 3.5 3.9 3.7  CL 101 107 113* 115* 115* 112*  CO2 21* 22  GLUCOSE 132* 152* 146* 165* 191* 184*  BUN 100* 107* 99* 100* 100* 103*  CREATININE 1.03* 0.99 1.00 0.92 0.95 1.00  CALCIUM 8.8* 8.6* 8.2* 8.4* 8.7* 8.4*  MG 2.3 2.1 2.2 2.3 2.3 2.2  PHOS 4.4 4.5 4.0 3.6  --  3.6    Liver Function Tests:  Recent Labs Lab 11/19/15 0415 11/22/15 0400  AST 67* 61*  ALT 36 33  ALKPHOS 82 91  BILITOT 0.7 0.6  PROT 5.2* 5.5*  ALBUMIN 2.1* 2.2*   No results for input(s): LIPASE, AMYLASE in the last 168 hours. No results for input(s): AMMONIA in the last 168 hours.  CBC:  Recent Labs Lab 11/18/15 0422 11/19/15 0415 11/19/15 1503 11/20/15 0445 11/21/15 0430 11/22/15 0400  WBC 6.5 5.6 7.7 6.8 4.0 7.5  NEUTROABS 4.8 4.0  --  5.0 3.3 6.7  HGB 7.1* 6.9* 8.0* 8.5* 8.4* 8.5*  HCT 23.3* 22.5* 26.8* 27.2* 28.5* 28.9*  MCV 88.6 90.7 89.6  89.2 90.8 91.5  PLT 158 148* 137* 132* 155 163    Cardiac Enzymes:  Recent Labs Lab 11/17/15 0345 11/17/15 0931 11/17/15 1800 11/19/15 0415 11/21/15 0430  CKTOTAL  --   --   --  23* 28*  CKMB  --   --   --  1.5 2.6  TROPONINI 1.74* 2.00* 1.66*  --   --     BNP: Invalid input(s): POCBNP  CBG:  Recent Labs Lab 11/21/15 1535 11/21/15 1952 11/22/15 0100 11/22/15 0420 11/22/15 0837  GLUCAP 201* 158* 170* 167* 153*    Microbiology: Results for orders placed or performed during the hospital encounter of 11/07/15  Urine culture     Status: None   Collection Time: 11/07/15  7:49 PM  Result Value Ref Range Status   Specimen Description URINE, CATHETERIZED  Final   Special Requests NONE  Final   Culture NO GROWTH 2 DAYS  Final   Report Status 11/09/2015 FINAL  Final  MRSA PCR Screening     Status: None   Collection Time: 11/08/15  1:06 AM  Result Value Ref Range Status   MRSA by PCR NEGATIVE NEGATIVE Final    Comment:        The GeneXpert MRSA Assay (FDA  approved for NASAL specimens only), is one component of a comprehensive MRSA colonization surveillance program. It is not intended to diagnose MRSA infection nor to guide or monitor treatment for MRSA infections.   Culture, blood (routine x 2)     Status: None   Collection Time: 11/08/15 12:57 PM  Result Value Ref Range Status   Specimen Description BLOOD RIGHT ARM  Final   Special Requests BOTTLES DRAWN AEROBIC AND ANAEROBIC 10 CC  Final   Culture NO GROWTH 5 DAYS  Final   Report Status 11/13/2015 FINAL  Final  Culture, blood (routine x 2)     Status: None   Collection Time: 11/08/15  1:20 PM  Result Value Ref Range Status   Specimen Description BLOOD LEFT HAND  Final   Special Requests IN PEDIATRIC BOTTLE 2 CC  Final   Culture NO GROWTH 5 DAYS  Final   Report Status 11/13/2015 FINAL  Final  Culture, respiratory (NON-Expectorated)     Status: None   Collection Time: 11/17/15 11:35 AM  Result Value Ref  Range Status   Specimen Description TRACHEAL ASPIRATE  Final   Special Requests Normal  Final   Gram Stain   Final    MODERATE WBC PRESENT,BOTH PMN AND MONONUCLEAR RARE SQUAMOUS EPITHELIAL CELLS PRESENT NO ORGANISMS SEEN Performed at [facility]    Culture   Final    NORMAL OROPHARYNGEAL FLORA Performed at [facility]    Report Status 11/20/2015 FINAL  Final  Urine culture     Status: None   Collection Time: 11/18/15  7:01 PM  Result Value Ref Range Status   Specimen Description URINE, CATHETERIZED  Final   Special Requests NONE  Final   Culture >=100,000 COLONIES/mL YEAST  Final   Report Status 11/20/2015 FINAL  Final  C difficile quick scan w PCR reflex     Status: None   Collection Time: 11/21/15  3:12 PM  Result Value Ref Range Status   C Diff antigen NEGATIVE NEGATIVE Final   C Diff toxin NEGATIVE NEGATIVE Final   C Diff interpretation Negative for toxigenic C. difficile  Final    Coagulation Studies: No results for input(s): LABPROT, INR in the last 72 hours.  Urinalysis: No results for input(s): COLORURINE, LABSPEC, PHURINE, GLUCOSEU, HGBUR, BILIRUBINUR, KETONESUR, PROTEINUR, UROBILINOGEN, NITRITE, LEUKOCYTESUR in the last 72 hours.  Invalid input(s): APPERANCEUR    Imaging: Dg Chest Port 1 View  11/22/2015  CLINICAL DATA:  Intubation. EXAM: PORTABLE CHEST 1 VIEW COMPARISON:  11/21/2015.  11/21/2015.  11/20/2015. FINDINGS: Left chest tube side holes remain partially outside of the left chest cavity Endotracheal tube, NG tube, left IJ line in stable position. No pneumothorax. Cardiomegaly with pulmonary interstitial prominence and bilateral effusions consistent with congestive heart failure. Interstitial edema has progressed from prior exam. Small bilateral pleural effusions. IMPRESSION: 1. Left chest tube in unchanged position with sideholes partially outside of the left chest cavity. No pneumothorax. 2. Remaining lines and tubes in stable position. 3.  Prior CABG. Cardiomegaly with slight increase in bilateral pulmonary interstitial edema. Bilateral pleural effusions again noted. Findings consistent with congestive heart failure. These results will be called to the ordering clinician or representative by the Radiologist Assistant, and communication documented in the PACS or zVision Dashboard. Electronically Signed   By: Maisie Fus  Register   On: 11/22/2015 07:14   Dg Chest Port 1 View  11/21/2015  CLINICAL DATA:  Pneumothorax EXAM: PORTABLE CHEST 1 VIEW COMPARISON:  11/21/2015 FINDINGS: Cardiomediastinal silhouette is stable.  Again noted status post median sternotomy. Stable endotracheal tube, NG tube and left IJ central line position. There is no pneumothorax. Again noted mild congestion/ pulmonary edema without significant change in aeration. Stable bilateral small pleural effusion. Again noted a small caliber left lower lateral chest tube with some of the side ports outside rib cage cavity. There is no significant change in position from prior exam. Mild basilar atelectasis again noted. IMPRESSION: Again noted status post median sternotomy. Stable endotracheal tube, NG tube and left IJ central line position. There is no pneumothorax. Again noted mild congestion/ pulmonary edema without significant change in aeration. Stable bilateral small pleural effusion. Again noted a small caliber left lower lateral chest tube with some of the side ports outside rib cage cavity. There is no significant change in position from prior exam. Electronically Signed   By: Natasha Mead M.D.   On: 11/21/2015 15:30   Dg Chest Port 1 View  11/21/2015  CLINICAL DATA:  Acute respiratory failure, coronary artery disease, CHF. EXAM: PORTABLE CHEST 1 VIEW COMPARISON:  Portable chest x-ray of November 20, 2015 FINDINGS: The lungs are reasonably well inflated. Pleural fluid is present at the right lung base. There is bibasilar atelectasis or pneumonia. The cardiac silhouette remains  enlarged. The pulmonary vascularity is less engorged bilaterally. External pacemaker defibrillator pads are present. There are post CABG changes. Small caliber left chest tube is again demonstrated on the left with some of the side ports lying outside the chest. The endotracheal tube tip lies approximately 3.2 cm above the carina. The esophagogastric tube tip projects below the inferior margin of the image. The left internal jugular venous catheter tip projects over the midportion of the SVC. For recurrence mid words IMPRESSION: Bibasilar atelectasis and small right pleural effusion. CHF mildly improved. The support tubes are in reasonable position as described. Correlation as to the positioning of the small caliber tube over the left lower lateral chest wall is needed. Electronically Signed   By: David  [place] M.D.   On: 11/21/2015 07:17     Medications:   . sodium chloride Stopped (11/19/15 0700)  . sodium chloride Stopped (11/21/15 0800)  . sodium chloride Stopped (11/21/15 1153)  . dextrose 75 mL/hr at 11/21/15 1137   . sodium chloride   Intravenous Once  . acetylcysteine  2 mL Nebulization Q6H  . albuterol  2.5 mg Nebulization Q6H  . antiseptic oral rinse  7 mL Mouth Rinse 10 times per day  . atorvastatin  40 mg Oral q1800  . aztreonam  1 g Intravenous 3 times per day  . carvedilol  3.125 mg Oral BID WC  . chlorhexidine gluconate  15 mL Mouth Rinse BID  . feeding supplement (NEPRO CARB STEADY)  1,000 mL Per Tube Q24H  . feeding supplement (PRO-STAT SUGAR FREE 64)  60 mL Per Tube TID  . free water  300 mL Per Tube Q6H  . heparin subcutaneous  5,000 Units Subcutaneous 3 times per day  . hydrALAZINE  25 mg Oral 3 times per day  . insulin aspart  0-9 Units Subcutaneous Q4H  . isosorbide mononitrate  30 mg Oral Daily  . methylPREDNISolone (SOLU-MEDROL) injection  80 mg Intravenous Q6H  . pantoprazole (PROTONIX) IV  40 mg Intravenous Q24H  . primidone  50 mg Oral QHS  . sodium chloride  flush  10-40 mL Intracatheter Q12H  . sodium chloride flush  3 mL Intravenous Q12H   sodium chloride, fentaNYL (SUBLIMAZE) injection, hydrALAZINE, ipratropium-albuterol, metoprolol, midazolam, ondansetron **  OR** ondansetron (ZOFRAN) IV, sodium chloride flush  Assessment/ Plan:  Assessment/Plan: 82 year old white female with a fairly significant past medical history who is had now a 11 day hospitalization requiring ventilatory support. Other features of hospitalization include hemoptysis, need for high-protein tube feeds as well as apparent significant diuresis on multiple diuretic medications 1.Renal- BUN elevated out of proportion to creatinine. I think the reason for this is multifactorial. First of all, significant diuresis since she has been here on at any given time Lasix, Diamox, metolazone as well as Aldactone, she is receiving high-protein tube feeds, and she also had significant hemoptysis so is likely digesting some of the blood related to that with progressive anemia. No more diuretics for a CVP still 8- try to keep her positive. changed her tube feeds over to Nepro, but there will likely not be anything that we can do about her digesting hemoglobin. Her baseline creatinine is probably in the 0.8 range because I doubt that she has much muscle mass. Therefore, she could she has developed a little bit of renal insufficiency as well. This is going to resolve slowly stop diuretics and continue IV fluids D5 w  2. Hypertension/volume - as above don't believe she is wet.- gentle fluids IV  3. Anemia - supportive care 4. Dispo- as an aside patient would not be a candidate for chronic dialysis therapy due to her bedbound and chronically ill status 5. Pyuria- we'll send urine for culture- is neg but also is already on aztreonam and vancomycin 6. Hypernatremia Start D5 w      LOS: 15 [place] W  :29 AM

## 2015-11-23 ENCOUNTER — Inpatient Hospital Stay (HOSPITAL_COMMUNITY): Payer: Medicare Other

## 2015-11-23 DIAGNOSIS — R609 Edema, unspecified: Secondary | ICD-10-CM

## 2015-11-23 LAB — COMPREHENSIVE METABOLIC PANEL
ALT: 58 U/L — AB (ref 14–54)
ANION GAP: 9 (ref 5–15)
AST: 115 U/L — ABNORMAL HIGH (ref 15–41)
Albumin: 2 g/dL — ABNORMAL LOW (ref 3.5–5.0)
Alkaline Phosphatase: 106 U/L (ref 38–126)
BUN: 118 mg/dL — ABNORMAL HIGH (ref 6–20)
CO2: 22 mmol/L (ref 22–32)
Calcium: 8.4 mg/dL — ABNORMAL LOW (ref 8.9–10.3)
Chloride: 107 mmol/L (ref 101–111)
Creatinine, Ser: 1.07 mg/dL — ABNORMAL HIGH (ref 0.44–1.00)
GFR, EST AFRICAN AMERICAN: 54 mL/min — AB (ref >60)
GFR, EST NON AFRICAN AMERICAN: 47 mL/min — AB (ref >60)
Glucose, Bld: 199 mg/dL — ABNORMAL HIGH (ref 65–99)
POTASSIUM: 3.7 mmol/L (ref 3.5–5.1)
Sodium: 138 mmol/L (ref 135–145)
Total Bilirubin: 0.4 mg/dL (ref 0.3–1.2)
Total Protein: 5.3 g/dL — ABNORMAL LOW (ref 6.5–8.1)

## 2015-11-23 LAB — GLUCOSE, CAPILLARY
GLUCOSE-CAPILLARY: 134 mg/dL — AB (ref 65–99)
GLUCOSE-CAPILLARY: 175 mg/dL — AB (ref 65–99)
GLUCOSE-CAPILLARY: 185 mg/dL — AB (ref 65–99)
GLUCOSE-CAPILLARY: 210 mg/dL — AB (ref 65–99)
Glucose-Capillary: 185 mg/dL — ABNORMAL HIGH (ref 65–99)
Glucose-Capillary: 183 mg/dL — ABNORMAL HIGH (ref 65–99)
Glucose-Capillary: 169 mg/dL — ABNORMAL HIGH (ref 65–99)

## 2015-11-23 LAB — APTT: aPTT: 60 seconds — ABNORMAL HIGH (ref 24–37)

## 2015-11-23 LAB — PROTIME-INR
INR: 1.15 (ref 0.00–1.49)
Prothrombin Time: 14.9 seconds (ref 11.6–15.2)

## 2015-11-23 NOTE — Care Management Important Message (Signed)
Important Message  Patient Details  Name: MADELON WELSCH MRN: [id number] Date of Birth: [birth year]   Medicare Important Message Given:  Yes    Hanley Hays, RN 11/23/2015, 11:01 AM

## 2015-11-23 NOTE — Progress Notes (Addendum)
CSW present for family meeting to discuss goals of care. Patient's family reports wanting to move forward with tracheostomy and then LTAC. Patient's family expressed that their ultimate goal is for Patient to return home and be cared for by her family. Patient's family seemed receptive to information provided by MD. Patient's family toured Physiological scientist with LTAC representative following the meeting. Plan is for Patient to be trached on tomorrow (tentatively set for 3/2 0800 Dr. Annalee Genta) with possible d/c to LTAC once medically stable. Per Patient's daughter, Patient has a Child psychotherapist through [employer] (Melissa Swing) her contact is [phone] / [phone]. CSW will continue to follow for disposition.   Noe Gens, LCSW [facility] Clinical Social Worker [phone]

## 2015-11-23 NOTE — Progress Notes (Addendum)
Fentanyl 100 mg given at 2008 for mild agitation/asynchrony (vent alarms, attempting reach up to tube) after cleaning BM. When family came back to room advised I had given her Fentanyl pain med, family questioned 2 different times why I gave a pain medication when she wasn't in pain. I advised them we treat pain first and Fentanyl is used for sedation also.  Daughter asked for patient to be given medication for her to sleep not pain. Versed 2 mg given 2250 for rest.

## 2015-11-23 NOTE — Progress Notes (Signed)
Chest tube removed per MD order. Patient given 100 mcg of fentanyl. No complications noted. Daughter Hafa at bedside. Will continue to monitor.

## 2015-11-23 NOTE — Progress Notes (Signed)
Patient's daughter in law, Kathlyn [place], expressed concern about nursing staff not being able to interpret patient needs, specifically when patient is in pain or having discomfort. Will remind staff to utilize daughter's, Hafa and Raja, who are often at bedside, to communicate if patient is in pain.

## 2015-11-23 NOTE — Progress Notes (Signed)
eLink Physician-Brief Progress Note Patient Name: [name] DOB: 07-12-1931 MRN: [id number]   Date of Service  11/23/2015  HPI/Events of Note  Type and screen ordered in prep for trach planned for 3/2  Recent Labs Lab 11/17/15 0035 11/19/15 0415 11/23/15 1015  INR 1.18 1.13 1.15     eICU Interventions       Intervention Category Minor Interventions: Other:  Kariss Longmire S. 11/23/2015, 8:40 PM

## 2015-11-23 NOTE — Progress Notes (Signed)
Family meeting held in conference room to discuss goals of care. Family refused interpreter at this time. Signed waiver of refusal placed in patient chart. Patient's family wishes to proceed with tracheostomy. Procedure tentatively set for tomorrow morning, 3/2 at 0800, with Dr. Annalee Genta. Orders per Dr. Tyson Alias to keep NPO after midnight and hold heparin SQ.

## 2015-11-23 NOTE — Progress Notes (Addendum)
[facility] ROUNDING NOTE   Subjective:   Interval History: no changes  Trach in AM  Objective:  Vital signs in last 24 hours:  Temp:  [97.4 F (36.3 C)-98.7 F (37.1 C)] 98.5 F (36.9 C) (03/01 0750) Pulse Rate:  [32-110] 70 (03/01 1000) Resp:  [14-30] 16 (03/01 1000) BP: (96-174)/(34-103) 140/47 mmHg (03/01 1000) SpO2:  [97 %-100 %] 100 % (03/01 1000) FiO2 (%):  [40 %] 40 % (03/01 0800)  Weight change:  Filed Weights   11/15/15 0434 11/16/15 0500 11/20/15 1200  Weight: 84.8 kg (186 lb 15.2 oz) 85.9 kg (189 lb 6 oz) 87 kg (191 lb 12.8 oz)    Intake/Output: I/O last 3 completed shifts: In: 4815 [I.V.:2730; NG/GT:1785; IV Piggyback:300] Out: 1695 [Urine:1630; Chest Tube:65]   Intake/Output this shift:  Total I/O In: 85 [I.V.:75; NG/GT:10] Out: 45 [Urine:45]  CVS- RRR RS- CTA intubated ABD- BS present soft non-distended EXT- no edema   Basic Metabolic Panel:  Recent Labs Lab 11/18/15 0422 11/19/15 0415 11/20/15 0445 11/21/15 0430 11/21/15 1515 11/22/15 0400 11/23/15 0450  NA 142 145 147* 147* 149* 143 138  K 3.1* 3.4* 3.6 3.5 3.9 3.7 3.7  CL 101 107 113* 115* 115* 112* 107  CO2 21* 22 22  GLUCOSE 132* 152* 146* 165* 191* 184* 199*  BUN 100* 107* 99* 100* 100* 103* 118*  CREATININE 1.03* 0.99 1.00 0.92 0.95 1.00 1.07*  CALCIUM 8.8* 8.6* 8.2* 8.4* 8.7* 8.4* 8.4*  MG 2.3 2.1 2.2 2.3 2.3 2.2  --   PHOS 4.4 4.5 4.0 3.6  --  3.6  --     Liver Function Tests:  Recent Labs Lab 11/19/15 0415 11/22/15 0400 11/23/15 0450  AST 67* 61* 115*  ALT 36 33 58*  ALKPHOS 82 91 106  BILITOT 0.7 0.6 0.4  PROT 5.2* 5.5* 5.3*  ALBUMIN 2.1* 2.2* 2.0*   No results for input(s): LIPASE, AMYLASE in the last 168 hours. No results for input(s): AMMONIA in the last 168 hours.  CBC:  Recent Labs Lab 11/18/15 0422 11/19/15 0415 11/19/15 1503 11/20/15 0445 11/21/15 0430 11/22/15 0400  WBC 6.5 5.6 7.7 6.8 4.0 7.5  NEUTROABS 4.8 4.0  --   5.0 3.3 6.7  HGB 7.1* 6.9* 8.0* 8.5* 8.4* 8.5*  HCT 23.3* 22.5* 26.8* 27.2* 28.5* 28.9*  MCV 88.6 90.7 89.6 89.2 90.8 91.5  PLT 158 148* 137* 132* 155 163    Cardiac Enzymes:  Recent Labs Lab 11/17/15 0345 11/17/15 0931 11/17/15 1800 11/19/15 0415 11/21/15 0430  CKTOTAL  --   --   --  23* 28*  CKMB  --   --   --  1.5 2.6  TROPONINI 1.74* 2.00* 1.66*  --   --     BNP: Invalid input(s): POCBNP  CBG:  Recent Labs Lab 11/22/15 1809 11/22/15 2030 11/23/15 0044 11/23/15 0454 11/23/15 0825  GLUCAP 152* 177* 210* 185* 175*    Microbiology: Results for orders placed or performed during the hospital encounter of 11/07/15  Urine culture     Status: None   Collection Time: 11/07/15  7:49 PM  Result Value Ref Range Status   Specimen Description URINE, CATHETERIZED  Final   Special Requests NONE  Final   Culture NO GROWTH 2 DAYS  Final   Report Status 11/09/2015 FINAL  Final  MRSA PCR Screening     Status: None   Collection Time: 11/08/15  1:06 AM  Result Value Ref Range  Status   MRSA by PCR NEGATIVE NEGATIVE Final    Comment:        The GeneXpert MRSA Assay (FDA approved for NASAL specimens only), is one component of a comprehensive MRSA colonization surveillance program. It is not intended to diagnose MRSA infection nor to guide or monitor treatment for MRSA infections.   Culture, blood (routine x 2)     Status: None   Collection Time: 11/08/15 12:57 PM  Result Value Ref Range Status   Specimen Description BLOOD RIGHT ARM  Final   Special Requests BOTTLES DRAWN AEROBIC AND ANAEROBIC 10 CC  Final   Culture NO GROWTH 5 DAYS  Final   Report Status 11/13/2015 FINAL  Final  Culture, blood (routine x 2)     Status: None   Collection Time: 11/08/15  1:20 PM  Result Value Ref Range Status   Specimen Description BLOOD LEFT HAND  Final   Special Requests IN PEDIATRIC BOTTLE 2 CC  Final   Culture NO GROWTH 5 DAYS  Final   Report Status 11/13/2015 FINAL  Final   Culture, respiratory (NON-Expectorated)     Status: None   Collection Time: 11/17/15 11:35 AM  Result Value Ref Range Status   Specimen Description TRACHEAL ASPIRATE  Final   Special Requests Normal  Final   Gram Stain   Final    MODERATE WBC PRESENT,BOTH PMN AND MONONUCLEAR RARE SQUAMOUS EPITHELIAL CELLS PRESENT NO ORGANISMS SEEN Performed at [facility]    Culture   Final    NORMAL OROPHARYNGEAL FLORA Performed at [facility]    Report Status 11/20/2015 FINAL  Final  Urine culture     Status: None   Collection Time: 11/18/15  7:01 PM  Result Value Ref Range Status   Specimen Description URINE, CATHETERIZED  Final   Special Requests NONE  Final   Culture >=100,000 COLONIES/mL YEAST  Final   Report Status 11/20/2015 FINAL  Final  C difficile quick scan w PCR reflex     Status: None   Collection Time: 11/21/15  3:12 PM  Result Value Ref Range Status   C Diff antigen NEGATIVE NEGATIVE Final   C Diff toxin NEGATIVE NEGATIVE Final   C Diff interpretation Negative for toxigenic C. difficile  Final    Coagulation Studies: No results for input(s): LABPROT, INR in the last 72 hours.  Urinalysis: No results for input(s): COLORURINE, LABSPEC, PHURINE, GLUCOSEU, HGBUR, BILIRUBINUR, KETONESUR, PROTEINUR, UROBILINOGEN, NITRITE, LEUKOCYTESUR in the last 72 hours.  Invalid input(s): APPERANCEUR    Imaging: Dg Chest Port 1 View  11/23/2015  CLINICAL DATA:  Acute respiratory failure, pneumonia, CHF. EXAM: PORTABLE CHEST 1 VIEW COMPARISON:  Portable chest x-ray of November 22, 2015 FINDINGS: The lungs are adequately inflated. There is persistent increased density in the retrocardiac region on the left with obscuration of the hemidiaphragm. Increased density is also present in the lower hemithorax on the right. There is no pneumothorax The cardiac silhouette remains enlarged. The pulmonary vascularity remains engorged and indistinct. There are post CABG changes. The  endotracheal tube tip lies 3.7 cm above the carina. The esophagogastric tube tip projects in gastric body. The left internal jugular venous catheter tip projects over the midportion of the SVC. The small caliber left-sided chest tube appears to lie outside the confines of the S pleural space. IMPRESSION: CHF with bibasilar atelectasis or pneumonia greater on the left than on the right. Small to moderate-sized bilateral pleural effusions. No pneumothorax. The small caliber chest tube  lies outside the confines of the pleural space. The other support tubes are in stable position. Electronically Signed   By: David  [place] M.D.   On: 11/23/2015 07:19   Dg Chest Port 1 View  11/22/2015  CLINICAL DATA:  Intubation. EXAM: PORTABLE CHEST 1 VIEW COMPARISON:  11/21/2015.  11/21/2015.  11/20/2015. FINDINGS: Left chest tube side holes remain partially outside of the left chest cavity Endotracheal tube, NG tube, left IJ line in stable position. No pneumothorax. Cardiomegaly with pulmonary interstitial prominence and bilateral effusions consistent with congestive heart failure. Interstitial edema has progressed from prior exam. Small bilateral pleural effusions. IMPRESSION: 1. Left chest tube in unchanged position with sideholes partially outside of the left chest cavity. No pneumothorax. 2. Remaining lines and tubes in stable position. 3. Prior CABG. Cardiomegaly with slight increase in bilateral pulmonary interstitial edema. Bilateral pleural effusions again noted. Findings consistent with congestive heart failure. These results will be called to the ordering clinician or representative by the Radiologist Assistant, and communication documented in the PACS or zVision Dashboard. Electronically Signed   By: Maisie Fus  Register   On: 11/22/2015 07:14   Dg Chest Port 1 View  11/21/2015  CLINICAL DATA:  Pneumothorax EXAM: PORTABLE CHEST 1 VIEW COMPARISON:  11/21/2015 FINDINGS: Cardiomediastinal silhouette is stable. Again noted  status post median sternotomy. Stable endotracheal tube, NG tube and left IJ central line position. There is no pneumothorax. Again noted mild congestion/ pulmonary edema without significant change in aeration. Stable bilateral small pleural effusion. Again noted a small caliber left lower lateral chest tube with some of the side ports outside rib cage cavity. There is no significant change in position from prior exam. Mild basilar atelectasis again noted. IMPRESSION: Again noted status post median sternotomy. Stable endotracheal tube, NG tube and left IJ central line position. There is no pneumothorax. Again noted mild congestion/ pulmonary edema without significant change in aeration. Stable bilateral small pleural effusion. Again noted a small caliber left lower lateral chest tube with some of the side ports outside rib cage cavity. There is no significant change in position from prior exam. Electronically Signed   By: Natasha Mead M.D.   On: 11/21/2015 15:30     Medications:   . sodium chloride Stopped (11/19/15 0700)  . sodium chloride Stopped (11/21/15 0800)  . sodium chloride Stopped (11/21/15 1153)  . dextrose 75 mL/hr at 11/23/15 0933   . sodium chloride   Intravenous Once  . albuterol  2.5 mg Nebulization Q6H  . antiseptic oral rinse  7 mL Mouth Rinse 10 times per day  . atorvastatin  40 mg Oral q1800  . aztreonam  1 g Intravenous 3 times per day  . carvedilol  3.125 mg Oral BID WC  . chlorhexidine gluconate  15 mL Mouth Rinse BID  . feeding supplement (NEPRO CARB STEADY)  1,000 mL Per Tube Q24H  . feeding supplement (PRO-STAT SUGAR FREE 64)  60 mL Per Tube TID  . fluconazole (DIFLUCAN) IV  100 mg Intravenous Q24H  . heparin subcutaneous  5,000 Units Subcutaneous 3 times per day  . hydrALAZINE  25 mg Oral 3 times per day  . insulin aspart  0-9 Units Subcutaneous Q4H  . isosorbide mononitrate  30 mg Oral Daily  . methylPREDNISolone (SOLU-MEDROL) injection  80 mg Intravenous Q6H  .  nystatin  5 mL Oral QID  . pantoprazole (PROTONIX) IV  40 mg Intravenous Q24H  . primidone  50 mg Oral QHS  . sodium chloride flush  10-40 mL Intracatheter Q12H  . sodium chloride flush  3 mL Intravenous Q12H   sodium chloride, fentaNYL (SUBLIMAZE) injection, hydrALAZINE, ipratropium-albuterol, metoprolol, midazolam, ondansetron **OR** ondansetron (ZOFRAN) IV, sodium chloride flush  Assessment/ Plan:  Assessment/Plan: 82 year old white female with a fairly significant past medical history who is had now a 11 day hospitalization requiring ventilatory support. Other features of hospitalization include hemoptysis, need for high-protein tube feeds as well as apparent significant diuresis on multiple diuretic medications 1.Renal- BUN elevated out of proportion to creatinine. I think the reason for this is multifactorial. First of all, significant diuresis since she has been here on at any given time Lasix, Diamox, metolazone as well as Aldactone, she is receiving high-protein tube feeds, and she also had significant hemoptysis so is likely digesting some of the blood related to that with progressive anemia. No more diuretics for a CVP still 8- try to keep her positive. changed her tube feeds over to Nepro, but there will likely not be anything that we can do about her digesting hemoglobin. Her baseline creatinine is probably in the 0.8 range because I doubt that she has much muscle mass. Therefore, she could she has developed a little bit of renal insufficiency as well. Will KVO IVF 2. Hypertension/volume - KVO IVF 3. Anemia - supportive care 4. Dispo- as an aside patient would not be a candidate for chronic dialysis therapy due to her bedbound and chronically ill status 5. Pyuria-  diflucan 6. Hypernatremia  Resolved  Azotemia -- IV steroids, renal insufficiency, GI blood and hypercatabolic state on high protein feeds  Discussed with Dr Tyson Alias patient to be transferred to [facility] - will  sign off    LOS: 16 [name] W @TODAY @10 :39 AM

## 2015-11-23 NOTE — Progress Notes (Signed)
PULMONARY / CRITICAL CARE MEDICINE   Name: [name] MRN: [id number] DOB: 12-29-1930    ADMISSION DATE:  11/07/2015 CONSULTATION DATE:  11/08/15 LOS 16 das  REFERRING MD: Lars Masson, A  CHIEF COMPLAINT:  Altered mental status, hypercarbic resp failure.   CULTURES: 11/17/15 - resp culture - normal flora  ANTIBIOTICS: Vanc 02/14 >> 2/16, 2/24 >>2/26 Azactam 02/14 >> 2/21, 2/24 >>   LINES/TUBES: ETT 02/14>>> L IJ TLC 2/14>> Left chest tube 2/23 >>water seal 2/27>>>  SIGNIFICANT EVENTS: CT head 2/13. Chronic age-related changes, old infarcts. No acute abnormality. MRI head 2/13. No acute intracranial process ................................ 2/14  Admit with altered mental status, hypercarbic, failed bipap / intubated - Chest x-ray 2/14 bilateral pleural effusions, basilar opacities 2/17  Remains on lasix gtt, precedex  2/19 :  No acute events overnight.  Pt more alert/awake this am.  Follows commands (when demonstrated first)   11/14/15- pn prn fentanyl. Just started sbt . Daughter at bedside. RN reports patient exhausted and edematous and sometimes agitated . Restart scheduled lasix   11/15/15 - ET tube too low. Got 1 unit prbc for hg < 7gm%. Stool OB x 1 negative. Failed SBT. Lasix held by cards due o cxr improvement and cVP 5. Improving creat but worsening BUN to 90s. Echo pending.  Daughte rreports patien bed bound and in diapers since 2009 but normal mental status . Mild dysphagia at baseline for hard solids +  11/16/15 - EF 35%. CVP up again and cards gave 1 dose lasix. BUN still high 80s. CXr stil congested. Doing SBT x 3h off precedex. Normal WUA per RN. Looks exhausted . FOBT x 2 ngative . DPOA indicating full code + full med care  11/17/15 - massive hemoptysis post extubation - new finding with emergent bronch and intubation and s/p l;eft ches ttube for pneumothorax. This AM -> on prn sedation. Not on pressors. BUN continues to rise. No hemoptysis this AM  11/18/15 -   On chart reviw daughter Crissie Reese showing signs of spiritical distress - yelling at RN and feeling mom not being taken care of well. GBM an RF negative yesterd. BUn/creat rising. Mild intermittent hemoptysis + via ETT tube. Daughter Angelyn Punt DPOA at bedside. Had me update patient daughter in law Brien Mates RN over speaker phone. 40% fio3  Yesterday - daughter Angelyn Punt -> indicated no tracheostomy to PCCM MD. And per RN Archie Patten -> other daughter Crissie Reese also indicated no tracheostomy but at same tme they are indicating full code and full medical care  11/19/15 -  No further hemoptysis.  Got 1 unit prbc overnight.   Per RN  Shanda Bumps - reports of overnigth intermittent agitation but currently RASS -3 on diprivan gtt. Per Daugther haifa (non dpoa)- patient was without agitation till 3am and is upset that RN reporting that there was agitation. [name] does NOT want RN Lurena Joiner taking care of patient Unclear to MD if family member upset that patient is sedated or she got agitated. Not on pressors.   BUN continues to climb - renal follwing. Duplex LE - negative for DVT. Path from 11/17/15 - shows blood clot; no malignant cells.    SUBJECTIVE/OVERNIGHT/INTERVAL HX 5/5 1 hr, failed with high rate  VITAL SIGNS: BP 137/87 mmHg  Pulse 87  Temp(Src) 98.5 F (36.9 C) (Oral)  Resp 30  Ht  (1.575 m)  Wt 87 kg (191 lb 12.8 oz)  BMI 35.07 kg/m2  SpO2 100%  HEMODYNAMICS:    VENTILATOR SETTINGS: Vent Mode:  [-]  CPAP;PSV FiO2 (%):  [40 %] 40 % Set Rate:  [16 bmp] 16 bmp Vt Set:  [400 mL] 400 mL PEEP:  [5 cmH20] 5 cmH20 Pressure Support:  [5 cmH20-10 cmH20] 5 cmH20 Plateau Pressure:  [20 cmH20-29 cmH20] 20 cmH20  INTAKE / OUTPUT: I/O last 3 completed shifts: In: 7564 [I.V.:2730; NG/GT:1785; IV Piggyback:300] Out: 3329 [Urine:1630; Chest Tube:65]  PHYSICAL EXAMINATION: General: weak Neuro:  RASS 0, FC, eyes open, seems more agitated  HEENT:no jvd  Cardiovascular:  RRR, Nl S1/S2 -M Lungs:  Slight coarse  bilateral Abdomen: Soft, NT, ND and +BS. Ext: 1+ edema Skin: Intact.   LABS:  PULMONARY  Recent Labs Lab 11/17/15 0145 11/17/15 0400 11/18/15 0400  PHART 7.445  --   --   PCO2ART 46.8*  --   --   PO2ART 194.0*  --   --   HCO3 32.1*  --   --   TCO2 34  --   --   O2SAT 100.0 62.3 83.3    CBC  Recent Labs Lab 11/20/15 0445 11/21/15 0430 11/22/15 0400  HGB 8.5* 8.4* 8.5*  HCT 27.2* 28.5* 28.9*  WBC 6.8 4.0 7.5  PLT 132* 155 163    COAGULATION  Recent Labs Lab 11/17/15 0035 11/19/15 0415  INR 1.18 1.13    CARDIAC    Recent Labs Lab 11/17/15 0345 11/17/15 0931 11/17/15 1800  TROPONINI 1.74* 2.00* 1.66*   No results for input(s): PROBNP in the last 168 hours.   CHEMISTRY  Recent Labs Lab 11/18/15 0422 11/19/15 0415 11/20/15 0445 11/21/15 0430 11/21/15 1515 11/22/15 0400 11/23/15 0450  NA 142 145 147* 147* 149* 143 138  K 3.1* 3.4* 3.6 3.5 3.9 3.7 3.7  CL 101 107 113* 115* 115* 112* 107  CO2 28 26 24 23  21* 22 22  GLUCOSE 132* 152* 146* 165* 191* 184* 199*  BUN 100* 107* 99* 100* 100* 103* 118*  CREATININE 1.03* 0.99 1.00 0.92 0.95 1.00 1.07*  CALCIUM 8.8* 8.6* 8.2* 8.4* 8.7* 8.4* 8.4*  MG 2.3 2.1 2.2 2.3 2.3 2.2  --   PHOS 4.4 4.5 4.0 3.6  --  3.6  --    Estimated Creatinine Clearance: 41.5 mL/min (by C-G formula based on Cr of 1.07).   LIVER  Recent Labs Lab 11/17/15 0035 11/19/15 0415 11/22/15 0400 11/23/15 0450  AST  --  67* 61* 115*  ALT  --  36 33 58*  ALKPHOS  --  82 91 106  BILITOT  --  0.7 0.6 0.4  PROT  --  5.2* 5.5* 5.3*  ALBUMIN  --  2.1* 2.2* 2.0*  INR 1.18 1.13  --   --      INFECTIOUS  Recent Labs Lab 11/18/15 1122 11/19/15 11/19/15 0415 11/20/15 0445 11/21/15 0057  LATICACIDVEN  --  1.8  --   --  1.7  PROCALCITON 1.73  --  1.36 0.89  --      ENDOCRINE CBG (last 3)   Recent Labs  11/23/15 0044 11/23/15 0454 11/23/15 0825  GLUCAP 210* 185* 175*         IMAGING x48h  - image(s)  personally visualized  -   highlighted in bold Dg Chest Port 1 View  11/23/2015  CLINICAL DATA:  Acute respiratory failure, pneumonia, CHF. EXAM: PORTABLE CHEST 1 VIEW COMPARISON:  Portable chest x-ray of November 22, 2015 FINDINGS: The lungs are adequately inflated. There is persistent increased density in the retrocardiac region on the left with obscuration of the  hemidiaphragm. Increased density is also present in the lower hemithorax on the right. There is no pneumothorax The cardiac silhouette remains enlarged. The pulmonary vascularity remains engorged and indistinct. There are post CABG changes. The endotracheal tube tip lies 3.7 cm above the carina. The esophagogastric tube tip projects in gastric body. The left internal jugular venous catheter tip projects over the midportion of the SVC. The small caliber left-sided chest tube appears to lie outside the confines of the S pleural space. IMPRESSION: CHF with bibasilar atelectasis or pneumonia greater on the left than on the right. Small to moderate-sized bilateral pleural effusions. No pneumothorax. The small caliber chest tube lies outside the confines of the pleural space. The other support tubes are in stable position. Electronically Signed   By: David  Martinique M.D.   On: 11/23/2015 07:19   Dg Chest Port 1 View  11/22/2015  CLINICAL DATA:  Intubation. EXAM: PORTABLE CHEST 1 VIEW COMPARISON:  11/21/2015.  11/21/2015.  11/20/2015. FINDINGS: Left chest tube side holes remain partially outside of the left chest cavity Endotracheal tube, NG tube, left IJ line in stable position. No pneumothorax. Cardiomegaly with pulmonary interstitial prominence and bilateral effusions consistent with congestive heart failure. Interstitial edema has progressed from prior exam. Small bilateral pleural effusions. IMPRESSION: 1. Left chest tube in unchanged position with sideholes partially outside of the left chest cavity. No pneumothorax. 2. Remaining lines and tubes in  stable position. 3. Prior CABG. Cardiomegaly with slight increase in bilateral pulmonary interstitial edema. Bilateral pleural effusions again noted. Findings consistent with congestive heart failure. These results will be called to the ordering clinician or representative by the Radiologist Assistant, and communication documented in the PACS or zVision Dashboard. Electronically Signed   By: Marcello Moores  Register   On: 11/22/2015 07:14   Dg Chest Port 1 View  11/21/2015  CLINICAL DATA:  Pneumothorax EXAM: PORTABLE CHEST 1 VIEW COMPARISON:  11/21/2015 FINDINGS: Cardiomediastinal silhouette is stable. Again noted status post median sternotomy. Stable endotracheal tube, NG tube and left IJ central line position. There is no pneumothorax. Again noted mild congestion/ pulmonary edema without significant change in aeration. Stable bilateral small pleural effusion. Again noted a small caliber left lower lateral chest tube with some of the side ports outside rib cage cavity. There is no significant change in position from prior exam. Mild basilar atelectasis again noted. IMPRESSION: Again noted status post median sternotomy. Stable endotracheal tube, NG tube and left IJ central line position. There is no pneumothorax. Again noted mild congestion/ pulmonary edema without significant change in aeration. Stable bilateral small pleural effusion. Again noted a small caliber left lower lateral chest tube with some of the side ports outside rib cage cavity. There is no significant change in position from prior exam. Electronically Signed   By: Lahoma Crocker M.D.   On: 11/21/2015 15:30    ASSESSMENT / PLAN:  PULMONARY A: Hypercarbic respiratory failure - in setting of CHF, effusions and LLL PNA,  - 2/14 - failed bipap and intubated  - 11/16/15 - failed extubation with onset of hemoptysis and left ptx and reintubated 2/23.   - CT 2/23 - RML and RLL process. DUplex LE 2/24 - neg dvt  - Vasculitis and autoimmune negative 11/17/15  except ANA 1:80 and trace positive (non specific)   - 11/20/15 ->  Hemoptysis again x 2 in 16h. Meets indication for bronch (hemoptysis)  and trach (prolonged mech vent) pulm edema 2/28  P:   PS wean 5/5 , then rate 44,  PS increase again failed Upright as able ENT has on schedule for trach , will update ENT after I Speak to family, her neck is too short for bedside exposure CT is clearly now outside chest, dc this, no ptx  CARDIOVASCULAR A:  Coronary artery disease. S/p multiple stents and CABG. Hx HTN, CHF  P:  Pos daily balance Tele See renal  RENAL A:   UNclear BUN elevation, despite pos daily balance Hypernatremia resolved P:   At this stage, despite pos free water balance, BUN still up i would like to concentrate on her volume status with addition lasix at this stage Per renal Limit protein?, nutrition re evalution  GASTROINTESTINAL A:   GI prophylaxis. Nutrition. Ammonia elevated, likely hepatic congestion. Constipation  Neg cdiff P:   SUP: pantoprazole. TF - low protein NPO midnight if trach sought  HEMATOLOGIC  Recent Labs Lab 11/20/15 0445 11/21/15 0430 11/22/15 0400  HGB 8.5* 8.4* 8.5*  HCT 27.2* 28.5* 28.9*  WBC 6.8 4.0 7.5  PLT 132* 155 163    A:   Anemia. Thrombocytopenia resolving VTE prophylaxis. P:  SCD's. Monitor CBC coags repeat for trach sub q heparin  INFECTIOUS A:   Recent UTI and LLL PNA. S/p azactam ending 11/15/15 Pct low  - no fever 11/18/15. Restarted abx 11/18/15 due to  recurrent hemoptysis . Normal flora 2/23. Path clot  2/23 - only blood   P:    aztreonam 11/18/15 > to stop date  ENDOCRINE A:   Diabetes mellitus. P:   SSI.  NEUROLOGIC A:   #Baseline   - old strokes . Bed bound x 2009 but normal conversation. In diapers  deconditioned  P Prn sedation RASS goal: 0 Daily WUA. Continue Primidone  Ccm time 30 min   Family meeting at 3 today  Lavon Paganini. Titus Mould, MD, Ernest Pgr: [place]  [facility]

## 2015-11-23 NOTE — Progress Notes (Signed)
Earlier in shift daughter and another family member expressed concern over patient telling them she was thirsty and wanted to drink. I explained to family that I was performing mouth care that would help with dry mouth but that wouldn't change her having the feeling of being thirsty nor would placing ice cubes/water around lips change her feeling thirsty. When I attempted mouth care around 0025 patients refused by shaking head no strongly and closing mouth. As I was trying to collect a capillary blood sugar and give prn meds in central line she would not stop grabbing my hand, attempting to lean down to her restrained hands and trying to point to her mouth. Offered mouth care which she refused again in same manner and attempted to tell her no drinking with visual cues (patient doesn't speak [place] and daughter at bedside asleep). Fentanyl prn given. Held patient's hand until she fell asleep.  Blood glucose sample taken from central line which she slept through but when SQ insulin given patient woke up and started grabbing at my hand and not letting go. Versed prn given.

## 2015-11-23 NOTE — Progress Notes (Signed)
eLink Physician-Brief Progress Note Patient Name: [name] DOB: 08/22/1931 MRN: [id number]   Date of Service  11/23/2015  HPI/Events of Note  Restraints renewed until next assessment at 09:00 am   eICU Interventions       Intervention Category Minor Interventions: Agitation / anxiety - evaluation and management  Delinda Malan S. 11/23/2015, 6:23 PM

## 2015-11-23 NOTE — Progress Notes (Signed)
VASCULAR LAB PRELIMINARY  PRELIMINARY  PRELIMINARY  PRELIMINARY  Bilateral lower extremity venous duplex  completed.    Preliminary report:  Bilateral:  No evidence of DVT, superficial thrombosis, or Baker's Cyst.    Jaivian Battaglini, RVT 11/23/2015, 11:42 AM

## 2015-11-23 NOTE — Progress Notes (Signed)
RN called d/t pt very anxious, RR 40's.  Attempted to increase PS to 20- pt didn't tol well & continued to have increased RR and anxiety.  Pt placed back on full vent support- RN at bedside and aware.

## 2015-11-24 ENCOUNTER — Inpatient Hospital Stay (HOSPITAL_COMMUNITY): Payer: Medicare Other

## 2015-11-24 ENCOUNTER — Inpatient Hospital Stay (HOSPITAL_COMMUNITY): Payer: Medicare Other | Admitting: Anesthesiology

## 2015-11-24 ENCOUNTER — Encounter (HOSPITAL_COMMUNITY)
Admission: EM | Disposition: A | Discharge status: Nursing Home (Includes ICF) | Payer: Self-pay | Source: Home / Self Care | Attending: Internal Medicine

## 2015-11-24 ENCOUNTER — Encounter (HOSPITAL_COMMUNITY): Payer: Self-pay | Admitting: Anesthesiology

## 2015-11-24 HISTORY — PX: TRACHEOSTOMY TUBE PLACEMENT: SHX814

## 2015-11-24 LAB — SURGICAL PCR SCREEN
MRSA, PCR: NEGATIVE
Staphylococcus aureus: NEGATIVE

## 2015-11-24 LAB — PROTIME-INR
INR: 1.23 (ref 0.00–1.49)
PROTHROMBIN TIME: 15.7 s — AB (ref 11.6–15.2)

## 2015-11-24 LAB — BASIC METABOLIC PANEL
Anion gap: 8 (ref 5–15)
BUN: 141 mg/dL — AB (ref 6–20)
CHLORIDE: 108 mmol/L (ref 101–111)
CO2: 20 mmol/L — AB (ref 22–32)
Calcium: 8.3 mg/dL — ABNORMAL LOW (ref 8.9–10.3)
Creatinine, Ser: 1.1 mg/dL — ABNORMAL HIGH (ref 0.44–1.00)
GFR calc Af Amer: 53 mL/min — ABNORMAL LOW (ref >60)
GFR calc non Af Amer: 45 mL/min — ABNORMAL LOW (ref >60)
GLUCOSE: 150 mg/dL — AB (ref 65–99)
POTASSIUM: 3.6 mmol/L (ref 3.5–5.1)
Sodium: 136 mmol/L (ref 135–145)

## 2015-11-24 LAB — CBC WITH DIFFERENTIAL/PLATELET
Basophils Absolute: 0 10*3/uL (ref 0.0–0.1)
Basophils Relative: 0 %
EOS PCT: 0 %
Eosinophils Absolute: 0 10*3/uL (ref 0.0–0.7)
HCT: 25.8 % — ABNORMAL LOW (ref 36.0–46.0)
Hemoglobin: 7.9 g/dL — ABNORMAL LOW (ref 12.0–15.0)
LYMPHS ABS: 0.5 10*3/uL — AB (ref 0.7–4.0)
LYMPHS PCT: 9 %
MCH: 27.1 pg (ref 26.0–34.0)
MCHC: 30.6 g/dL (ref 30.0–36.0)
MCV: 88.4 fL (ref 78.0–100.0)
MONO ABS: 0.2 10*3/uL (ref 0.1–1.0)
Monocytes Relative: 4 %
Neutro Abs: 4.6 10*3/uL (ref 1.7–7.7)
Neutrophils Relative %: 87 %
PLATELETS: 147 10*3/uL — AB (ref 150–400)
RBC: 2.92 MIL/uL — AB (ref 3.87–5.11)
RDW: 18.9 % — ABNORMAL HIGH (ref 11.5–15.5)
WBC: 5.3 10*3/uL (ref 4.0–10.5)

## 2015-11-24 LAB — GLUCOSE, CAPILLARY
GLUCOSE-CAPILLARY: 142 mg/dL — AB (ref 65–99)
GLUCOSE-CAPILLARY: 80 mg/dL (ref 65–99)
GLUCOSE-CAPILLARY: 191 mg/dL — AB (ref 65–99)
GLUCOSE-CAPILLARY: 127 mg/dL — AB (ref 65–99)
GLUCOSE-CAPILLARY: 144 mg/dL — AB (ref 65–99)

## 2015-11-24 LAB — APTT: aPTT: 59 seconds — ABNORMAL HIGH (ref 24–37)

## 2015-11-24 SURGERY — CREATION, TRACHEOSTOMY
Anesthesia: General | Site: Neck

## 2015-11-24 MED ORDER — EPHEDRINE SULFATE 50 MG/ML IJ SOLN
INTRAMUSCULAR | Status: AC
Start: 2015-11-24 — End: 2015-11-24
  Filled 2015-11-24: qty 1

## 2015-11-24 MED ORDER — SODIUM CHLORIDE 0.9 % IJ SOLN
INTRAMUSCULAR | Status: AC
  Filled 2015-11-24: qty 10

## 2015-11-24 MED ORDER — GLYCOPYRROLATE 0.2 MG/ML IJ SOLN
INTRAMUSCULAR | Status: AC
  Filled 2015-11-24: qty 1

## 2015-11-24 MED ORDER — EPHEDRINE SULFATE 50 MG/ML IJ SOLN
INTRAMUSCULAR | Status: DC | PRN
  Administered 2015-11-24 (×2): 10 mg via INTRAVENOUS

## 2015-11-24 MED ORDER — PROPOFOL 10 MG/ML IV BOLUS
INTRAVENOUS | Status: DC | PRN
  Administered 2015-11-24: 50 mg via INTRAVENOUS

## 2015-11-24 MED ORDER — LIDOCAINE-EPINEPHRINE 1 %-1:100000 IJ SOLN
INTRAMUSCULAR | Status: AC
  Filled 2015-11-24: qty 1

## 2015-11-24 MED ORDER — FENTANYL CITRATE (PF) 100 MCG/2ML IJ SOLN
INTRAMUSCULAR | Status: DC | PRN
  Administered 2015-11-24: 100 ug via INTRAVENOUS

## 2015-11-24 MED ORDER — NEPRO/CARBSTEADY PO LIQD
1000.0000 mL | ORAL | Status: DC
  Administered 2015-11-24 – 2015-11-25 (×2): 1000 mL
  Filled 2015-11-24 (×3): qty 1000

## 2015-11-24 MED ORDER — MIDAZOLAM HCL 2 MG/2ML IJ SOLN
INTRAMUSCULAR | Status: AC
  Filled 2015-11-24: qty 2

## 2015-11-24 MED ORDER — PHENYLEPHRINE 40 MCG/ML (10ML) SYRINGE FOR IV PUSH (FOR BLOOD PRESSURE SUPPORT)
PREFILLED_SYRINGE | INTRAVENOUS | Status: AC
  Filled 2015-11-24: qty 10

## 2015-11-24 MED ORDER — METHYLPREDNISOLONE SODIUM SUCC 40 MG IJ SOLR
40.0000 mg | Freq: Two times a day (BID) | INTRAMUSCULAR | Status: DC
  Administered 2015-11-24 – 2015-11-25 (×2): 40 mg via INTRAVENOUS
  Filled 2015-11-24 (×3): qty 1

## 2015-11-24 MED ORDER — FUROSEMIDE 10 MG/ML IJ SOLN
40.0000 mg | Freq: Two times a day (BID) | INTRAMUSCULAR | Status: DC
  Administered 2015-11-24 – 2015-11-25 (×3): 40 mg via INTRAVENOUS
  Filled 2015-11-24 (×3): qty 4

## 2015-11-24 MED ORDER — ROCURONIUM BROMIDE 50 MG/5ML IV SOLN
INTRAVENOUS | Status: AC
  Filled 2015-11-24: qty 1

## 2015-11-24 MED ORDER — LACTATED RINGERS IV SOLN
INTRAVENOUS | Status: DC | PRN
  Administered 2015-11-24: 09:00:00 via INTRAVENOUS

## 2015-11-24 MED ORDER — PHENYLEPHRINE HCL 10 MG/ML IJ SOLN
INTRAMUSCULAR | Status: DC | PRN
  Administered 2015-11-24: 80 ug via INTRAVENOUS

## 2015-11-24 MED ORDER — PROPOFOL 10 MG/ML IV BOLUS
INTRAVENOUS | Status: AC
  Filled 2015-11-24: qty 20

## 2015-11-24 MED ORDER — 0.9 % SODIUM CHLORIDE (POUR BTL) OPTIME
TOPICAL | Status: DC | PRN
  Administered 2015-11-24: 1000 mL

## 2015-11-24 MED ORDER — MIDAZOLAM HCL 5 MG/5ML IJ SOLN
INTRAMUSCULAR | Status: DC | PRN
  Administered 2015-11-24: 1 mg via INTRAVENOUS

## 2015-11-24 MED ORDER — ARTIFICIAL TEARS OP OINT
TOPICAL_OINTMENT | OPHTHALMIC | Status: AC
  Filled 2015-11-24: qty 3.5

## 2015-11-24 MED ORDER — FENTANYL CITRATE (PF) 250 MCG/5ML IJ SOLN
INTRAMUSCULAR | Status: AC
  Filled 2015-11-24: qty 5

## 2015-11-24 MED ORDER — ROCURONIUM BROMIDE 100 MG/10ML IV SOLN
INTRAVENOUS | Status: DC | PRN
  Administered 2015-11-24: 30 mg via INTRAVENOUS

## 2015-11-24 MED ORDER — LIDOCAINE-EPINEPHRINE 1 %-1:100000 IJ SOLN
INTRAMUSCULAR | Status: DC | PRN
  Administered 2015-11-24: 2 mL

## 2015-11-24 SURGICAL SUPPLY — 47 items
BLADE SURG 15 STRL LF DISP TIS (BLADE) ×1 IMPLANT
BLADE SURG 15 STRL SS (BLADE) ×3
BLADE SURG ROTATE 9660 (MISCELLANEOUS) IMPLANT
CANISTER SUCTION 2500CC (MISCELLANEOUS) ×3 IMPLANT
CLEANER TIP ELECTROSURG 2X2 (MISCELLANEOUS) ×3 IMPLANT
COVER SURGICAL LIGHT HANDLE (MISCELLANEOUS) ×3 IMPLANT
DECANTER SPIKE VIAL GLASS SM (MISCELLANEOUS) ×3 IMPLANT
DRAPE PROXIMA HALF (DRAPES) IMPLANT
DRSG TEGADERM 4X4.75 (GAUZE/BANDAGES/DRESSINGS) ×3 IMPLANT
ELECT COATED BLADE 2.86 ST (ELECTRODE) ×3 IMPLANT
ELECT REM PT RETURN 9FT ADLT (ELECTROSURGICAL) ×3
ELECTRODE REM PT RTRN 9FT ADLT (ELECTROSURGICAL) ×1 IMPLANT
GAUZE SPONGE 4X4 16PLY XRAY LF (GAUZE/BANDAGES/DRESSINGS) ×6 IMPLANT
GAUZE XEROFORM 5X9 LF (GAUZE/BANDAGES/DRESSINGS) ×6 IMPLANT
GLOVE BIO SURGEON STRL SZ8 (GLOVE) ×3 IMPLANT
GLOVE BIOGEL M 7.0 STRL (GLOVE) ×9 IMPLANT
GLOVE BIOGEL PI IND STRL 6.5 (GLOVE) ×1 IMPLANT
GLOVE BIOGEL PI INDICATOR 6.5 (GLOVE) ×2
GLOVE SURG SS PI 6.5 STRL IVOR (GLOVE) ×3 IMPLANT
GLOVE SURG SS PI 7.5 STRL IVOR (GLOVE) ×6 IMPLANT
GOWN STRL REUS W/ TWL LRG LVL3 (GOWN DISPOSABLE) ×2 IMPLANT
GOWN STRL REUS W/TWL LRG LVL3 (GOWN DISPOSABLE) ×6
HOLDER TRACH TUBE VELCRO 19.5 (MISCELLANEOUS) ×3 IMPLANT
KIT BASIN OR (CUSTOM PROCEDURE TRAY) ×3 IMPLANT
KIT ROOM TURNOVER OR (KITS) ×3 IMPLANT
KIT SUCTION CATH 14FR (SUCTIONS) IMPLANT
NEEDLE HYPO 25GX1X1/2 BEV (NEEDLE) ×3 IMPLANT
NS IRRIG 1000ML POUR BTL (IV SOLUTION) ×3 IMPLANT
PACK EENT II TURBAN DRAPE (CUSTOM PROCEDURE TRAY) ×3 IMPLANT
PAD ARMBOARD 7.5X6 YLW CONV (MISCELLANEOUS) ×6 IMPLANT
PENCIL BUTTON HOLSTER BLD 10FT (ELECTRODE) ×3 IMPLANT
SPONGE DRAIN TRACH 4X4 STRL 2S (GAUZE/BANDAGES/DRESSINGS) ×3 IMPLANT
SPONGE INTESTINAL PEANUT (DISPOSABLE) ×3 IMPLANT
SURGILUBE 3G PEEL PACK STRL (MISCELLANEOUS) ×3 IMPLANT
SUT CHROMIC 2 0 SH (SUTURE) ×3 IMPLANT
SUT ETHILON 2 0 FS 18 (SUTURE) ×3 IMPLANT
SUT SILK 2 0 (SUTURE) ×3
SUT SILK 2 0 FS (SUTURE) ×3 IMPLANT
SUT SILK 2-0 18XBRD TIE 12 (SUTURE) ×1 IMPLANT
SUT SILK 3 0 REEL (SUTURE) ×3 IMPLANT
SYR 20CC LL (SYRINGE) ×3 IMPLANT
SYR BULB IRRIGATION 50ML (SYRINGE) IMPLANT
SYR CONTROL 10ML LL (SYRINGE) ×3 IMPLANT
TUBE CONNECTING 12'X1/4 (SUCTIONS) ×1
TUBE CONNECTING 12X1/4 (SUCTIONS) ×2 IMPLANT
TUBE TRACH SHILEY 8 DIST CUF (TUBING) ×3 IMPLANT
WATER STERILE IRR 1000ML POUR (IV SOLUTION) IMPLANT

## 2015-11-24 NOTE — Transfer of Care (Signed)
Immediate Anesthesia Transfer of Care Note  Patient: [name]  Procedure(s) Performed: Procedure(s): TRACHEOSTOMY (N/A)  Patient Location: ICU  Anesthesia Type:General  Level of Consciousness: awake, responds to stimulation and Patient remains intubated per anesthesia plan  Airway & Oxygen Therapy: Patient Spontanous Breathing, Patient remains intubated per anesthesia plan, Patient placed on Ventilator (see vital sign flow sheet for setting) and RT present to manage pt back onto vent.  Post-op Assessment: Report given to RN  Post vital signs: Reviewed and stable  Last Vitals:  Filed Vitals:   11/24/15 0800 11/24/15 0825  BP: 132/44 132/44  Pulse: 51 65  Temp: 36.5 C   Resp: 16 16    Complications: No apparent anesthesia complications

## 2015-11-24 NOTE — Progress Notes (Signed)
Unable to perform restraint assessment as patient in OR for trach.  Will complete every 2 hour assessment upon return and attempt to DC restraints if appropriate.  [place], Mitzi Hansen

## 2015-11-24 NOTE — Progress Notes (Signed)
Pt was ambu bagged on 100% fio2 to OR and reported to CRNA.  Pt appeared to tol transport well w/ no apparent complications.

## 2015-11-24 NOTE — Progress Notes (Signed)
PT Cancellation Note  Patient Details Name: [name] MRN: [id number] DOB: Mar 19, 1931   Cancelled Treatment:    Reason Eval/Treat Not Completed: Medical issues which prohibited therapy (Pt just back from trach, still groggy. Will return tomorrow as able.  Thanks.)   [name] 11/24/2015, 11:24 AM [name] [phone] (pager)

## 2015-11-24 NOTE — Progress Notes (Signed)
Pt just returned from OR.  Per MD- goal is to try ATC for up to 30 minutes today s/p trach.  Pt appears sleepy post procedure, will attempt ATC later.  RN aware.

## 2015-11-24 NOTE — Anesthesia Postprocedure Evaluation (Signed)
Anesthesia Post Note  Patient: [name]  Procedure(s) Performed: Procedure(s) (LRB): TRACHEOSTOMY (N/A)  Patient location during evaluation: ICU Anesthesia Type: General Level of consciousness: awake, responds to stimulation and patient remains intubated per anesthesia plan Pain management: pain level controlled Vital Signs Assessment: post-procedure vital signs reviewed and stable Respiratory status: respiratory function unstable, spontaneous breathing and patient on ventilator - see flowsheet for VS Cardiovascular status: blood pressure returned to baseline (beginning to be hypertensive;  RN to get pt something for ?pain) Postop Assessment: no signs of nausea or vomiting Anesthetic complications: no    Last Vitals:  Filed Vitals:   11/24/15 0800 11/24/15 0825  BP: 132/44 132/44  Pulse: 51 65  Temp: 36.5 C   Resp: 16 16    Last Pain:  Filed Vitals:   11/24/15 0958  PainSc: Asleep                 Darthula Desa

## 2015-11-24 NOTE — Progress Notes (Signed)
Spoke to on-call ENT, Dr. Jenne Pane, about patient's frequent saturating of gauze around trach.  He recommended replacing saturated gauze as needed, while leaving the Xeroform gauze in place.  He advised against using Surgicel due to causing an issue with her airway.  Will continue to monitor closely. [place], Mitzi Hansen

## 2015-11-24 NOTE — Brief Op Note (Signed)
11/07/2015 - 11/24/2015  10:58 AM  PATIENT:  [name]  82 y.o. female  PRE-OPERATIVE DIAGNOSIS:  RESPIRATORY FAILURE  POST-OPERATIVE DIAGNOSIS:  RESPIRATORY FAILURE  PROCEDURE:  Procedure(s): TRACHEOSTOMY (N/A)  SURGEON:  Surgeon(s) and Role:    * Osborn Coho, MD - Primary  PHYSICIAN ASSISTANT:   ASSISTANTS: none   ANESTHESIA:   general  EBL:  Total I/O In: 70 [I.V.:20; NG/GT:50] Out: 325 [Urine:175; Blood:150]  BLOOD ADMINISTERED:none  DRAINS: none   LOCAL MEDICATIONS USED:  LIDOCAINE  and Amount: 2 ml  SPECIMEN:  No Specimen  DISPOSITION OF SPECIMEN:  N/A  COUNTS:  YES  TOURNIQUET:  * No tourniquets in log *  DICTATION: .Other Dictation: Dictation Number [id number]  PLAN OF CARE: Admit to inpatient   PATIENT DISPOSITION:  PACU - hemodynamically stable.   Delay start of Pharmacological VTE agent (>24hrs) due to surgical blood loss or risk of bleeding: no

## 2015-11-24 NOTE — Progress Notes (Signed)
  This NP continues to offer intermittent, emotional support though this difficult situation.   Present for today's meeting with family detailing GOC.  Dr Tyson Alias directed meeting and detailed care plan, answering all the family's questions and addressing their concerns.   Plan today is for trach in the mornring.  Family toured [employer] today with anticipated transition as soon as medically appropriate.  Lorinda Creed NP  Please call if the PMT call be of support or assistance   # [phone]

## 2015-11-24 NOTE — Progress Notes (Signed)
Upon turning and repositioning patient at 11:15, restraints loosened and patient immediately brought up left hand and tried to grab trach tubing.  Patient reassured and ROM exercises performed and restraints secured.  Will continue to monitor closely to assess evidence of discontinuation criteria of restraints. [place], Mitzi Hansen

## 2015-11-24 NOTE — Op Note (Signed)
NAMEIZELA, ALTIER             ACCOUNT NO.:  [id number]  MEDICAL RECORD NO.:  [id number]  LOCATION:  2H09C                        FACILITY:  [facility]  PHYSICIAN:  Kinnie Scales. Annalee Genta, M.D.DATE OF BIRTH:  May 21, 1931  DATE OF PROCEDURE:  11/24/2015 DATE OF DISCHARGE:                              OPERATIVE REPORT   LOCATION:  [facility] Main OR.  PREOPERATIVE DIAGNOSIS:  Chronic ventilatory-dependent respiratory failure.  POSTOPERATIVE DIAGNOSIS:  Chronic ventilatory-dependent respiratory failure.  INDICATION FOR SURGERY:  Chronic ventilatory-dependent respiratory failure.  SURGICAL PROCEDURE:  Tracheostomy.  ANESTHESIA:  General endotracheal.  COMPLICATIONS:  There were no complications.  ESTIMATED BLOOD LOSS:  Approximately 50 mL.  DISPOSITION:  The patient transferred from the operating room to unit 2H in stable condition.  BRIEF HISTORY:  The patient is an 82 year old female, who was admitted to [facility] with progressive respiratory distress, requiring intubation.  She has a very complex medical and cardiac history.  The patient was extubated, but immediately developed recurrent hypoxia and required re-intubation.  Given the patient's history and inability to wean from the ventilator, ENT Service was consulted for placement of a tracheostomy for long-term airway management.  The risks and benefits of the procedure were discussed with the patient's family and they understood and agreed with our plan for surgery, which was scheduled at [facility] Main OR under general anesthesia on elective basis.  DESCRIPTION OF PROCEDURE:  The patient was brought to the operating room on November 24, 2015, and placed in a supine position on the operating table.  General endotracheal anesthesia was established via the patient's existing endotracheal tube.  The patient was positioned on the operating table and prepped and draped in a sterile fashion.  A  surgical time-out was then performed with correct identification of the patient and the surgical procedure.  The patient's neck skin was then injected with a total of 2 mL of 1% lidocaine and 1:100,000 solution of epinephrine, which was injected in a subcutaneous fashion in the skin overlying the anterior neck.  The patient was then further positioned, prepped and draped and prepared for surgery.  A 2-cm horizontally oriented skin incision was created using a #15 scalpel.  This was carried through the skin, subcutaneous tissue was then carefully dissected.  There was a moderate amount of subcutaneous fat, which was removed using Bovie electrocautery.  The patient had a very prominent anterior jugular vein crossing the surgical site and this was divided and suture ligated.  Strap muscles were identified in the midline and lateralized.  The pretracheal fat was then carefully dissected with Bovie electrocautery.  The patient's anterior trachea was then carefully palpated.  Cricoid cartilage and anterior tracheal wall were then identified.  The patient had a very prominent thyroid isthmus, which was divided with Bovie electrocautery and suture ligated with 2-0 chromic sutures.  This allowed access to the anterior compartment of the neck and the anterior trachea.  Horizontal tracheal incision was created at the second tracheal interspace.  A trach hook was then used to lift the cricoid.  The inferior aspect of the surgical incision was sutured to the inferior aspect of the tracheal incision with a 2-0 silk suture.  The patient's airway was thoroughly suctioned and the endotracheal tube was carefully withdrawn.  A #8 Shiley tracheostomy tube was then placed under direct visualization and this was sutured into position with a 2-0 Ethilon suture and secured with Velcro trach ties.  A Xeroform gauze was then placed within the tracheostomy site for better hemostasis.  The patient was then awakened  from anesthetic and was transferred from the operating room to unit 2H in stable condition.  No complications. Estimated blood loss, approximately 50 mL.          ______________________________ Kinnie Scales. Annalee Genta, M.D.     DLS/MEDQ  D:  09/81/1914  T:  11/24/2015  Job:  [id number]

## 2015-11-24 NOTE — Progress Notes (Signed)
Pt tolerated ATC x 30 well, no distress noted.  RN/RT at bedside t/o trial.  Pt appeared calm t/o.  Placed pt back on vent post ATC trial per MD vent/weaning plan for today.  Daughter at bedside t/o- all questions answered prior to trail.

## 2015-11-24 NOTE — Progress Notes (Signed)
Changed gauze and reinforced trach dressing 3X's in 5 hours since patient has been back from OR.  Drainage is sanguinous with clots.  Left voicemail for ENT and notified e-Link MD.  Will continue to monitor. [place], Mitzi Hansen

## 2015-11-24 NOTE — Anesthesia Preprocedure Evaluation (Addendum)
Anesthesia Evaluation  Patient identified by MRN, date of birth, ID band Patient awake    Reviewed: Allergy & Precautions, NPO status , Patient's Chart, lab work & pertinent test results, reviewed documented beta blocker date and time   Airway Mallampati: Intubated       Dental  (+) Dental Advisory Given, Teeth Intact   Pulmonary pneumonia,  Respiratory failure   Pulmonary exam normal        Cardiovascular hypertension, Pt. on home beta blockers and Pt. on medications + CAD, + CABG, + Peripheral Vascular Disease and +CHF  Normal cardiovascular exam  Systolic and diastolic heart failure. EF 40%   Neuro/Psych Right side weakness CVA, Residual Symptoms    GI/Hepatic   Endo/Other  diabetes, Well Controlled, Type 2  Renal/GU      Musculoskeletal   Abdominal   Peds  Hematology  (+) anemia , hgb 7.9   Anesthesia Other Findings   Reproductive/Obstetrics                            Anesthesia Physical Anesthesia Plan  ASA: IV  Anesthesia Plan: General   Post-op Pain Management:    Induction:   Airway Management Planned: Tracheostomy  Additional Equipment:   Intra-op Plan:   Post-operative Plan:   Informed Consent:   Plan Discussed with: Surgeon  Anesthesia Plan Comments:         Anesthesia Quick Evaluation

## 2015-11-24 NOTE — Progress Notes (Signed)
PULMONARY / CRITICAL CARE MEDICINE   Name: [name] MRN: [id number] DOB: 14-Mar-1931    ADMISSION DATE:  11/07/2015 CONSULTATION DATE:  11/08/15 LOS 70 das  REFERRING MD: Cyndia Diver, A  CHIEF COMPLAINT:  Altered mental status, hypercarbic resp failure.   CULTURES: 11/17/15 - resp culture - normal flora  ANTIBIOTICS: Vanc 02/14 >> 2/16, 2/24 >>2/26 Azactam 02/14 >> 2/21, 2/24 >>  LINES/TUBES: ETT 02/14>>> L IJ TLC 2/14>> Left chest tube 2/23 >>water seal 2/27>>>3/1  SIGNIFICANT EVENTS: CT head 2/13. Chronic age-related changes, old infarcts. No acute abnormality. MRI head 2/13. No acute intracranial process ................................ 2/14  Admit with altered mental status, hypercarbic, failed bipap / intubated - Chest x-ray 2/14 bilateral pleural effusions, basilar opacities 2/17  Remains on lasix gtt, precedex  2/19 :  No acute events overnight.  Pt more alert/awake this am.  Follows commands (when demonstrated first)   11/14/15- pn prn fentanyl. Just started sbt . Daughter at bedside. RN reports patient exhausted and edematous and sometimes agitated . Restart scheduled lasix   11/15/15 - ET tube too low. Got 1 unit prbc for hg < 7gm%. Stool OB x 1 negative. Failed SBT. Lasix held by cards due o cxr improvement and cVP 5. Improving creat but worsening BUN to 90s. Echo pending.  Daughte rreports patien bed bound and in diapers since 2009 but normal mental status . Mild dysphagia at baseline for hard solids +  11/16/15 - EF 35%. CVP up again and cards gave 1 dose lasix. BUN still high 80s. CXr stil congested. Doing SBT x 3h off precedex. Normal WUA per RN. Looks exhausted . FOBT x 2 ngative . DPOA indicating full code + full med care  11/17/15 - massive hemoptysis post extubation - new finding with emergent bronch and intubation and s/p l;eft ches ttube for pneumothorax. This AM -> on prn sedation. Not on pressors. BUN continues to rise. No hemoptysis this AM  11/18/15 -   On chart reviw daughter Ron Parker showing signs of spiritical distress - yelling at RN and feeling mom not being taken care of well. GBM an RF negative yesterd. BUn/creat rising. Mild intermittent hemoptysis + via ETT tube. Daughter [name] DPOA at bedside. Had me update patient daughter in law Ivan Croft RN over speaker phone. 40% fio3  Yesterday - daughter [name] -> indicated no tracheostomy to PCCM MD. And per RN Kenney Houseman -> other daughter Ron Parker also indicated no tracheostomy but at same tme they are indicating full code and full medical care  11/19/15 -  No further hemoptysis.  Got 1 unit prbc overnight.   Per RN  Janett Billow - reports of overnigth intermittent agitation but currently RASS -3 on diprivan gtt. Per Daugther haifa (non dpoa)- patient was without agitation till 3am and is upset that RN reporting that there was agitation. [name] does NOT want RN Wells Guiles taking care of patient Unclear to MD if family member upset that patient is sedated or she got agitated. Not on pressors.   BUN continues to climb - renal follwing. Duplex LE - negative for DVT. Path from 11/17/15 - shows blood clot; no malignant cells.   3/1- extensive d/w family, poor prognosis voiced, they wish trach. I met with daughters and son and daughter in law, I explained her recent hospital stay and failures to extubate or wean well . I explained that trach and vent dependence in setting of bed ridden and her age portends a poor prognosis to progress . Risk ulcers bed soars, PNA  and suffering. They want trach . I explained that she cant go home unless in future she is OFF vent ( which is possible) or stable vent settings. Social work also present, they wish [facility] for aggressive PT and weaning.  SUBJECTIVE/OVERNIGHT/INTERVAL HX Family meeting extensive, trach wished, poor prognosis to progress voiced  VITAL SIGNS: BP 136/48 mmHg  Pulse 51  Temp(Src) 97.6 F (36.4 C) (Oral)  Resp 16  Ht '5\' 2"'$  (1.575 m)  Wt 87 kg (191 lb 12.8 oz)   BMI 35.07 kg/m2  SpO2 100%  HEMODYNAMICS:    VENTILATOR SETTINGS: Vent Mode:  [-] PRVC FiO2 (%):  [40 %] 40 % Set Rate:  [16 bmp] 16 bmp Vt Set:  [400 mL] 400 mL PEEP:  [5 cmH20] 5 cmH20 Pressure Support:  [5 cmH20] 5 cmH20 Plateau Pressure:  [21 cmH20-26 cmH20] 21 cmH20  INTAKE / OUTPUT: I/O last 3 completed shifts: In: 2369.2 [I.V.:1149.2; NG/GT:920; IV Piggyback:300] Out: 3244 [Urine:1405; Chest Tube:60]  PHYSICAL EXAMINATION: General: weak grossly all ext Neuro:  RASS 0, FC, eyes open, calmer HEENT:no jvd  Cardiovascular:  RRR, Nl S1/S2 -M Lungs:  Reduced coarse Abdomen: Soft, NT, ND and +BS. Ext: 1+ edema unchanged Skin: Intact.   LABS:  PULMONARY  Recent Labs Lab 11/18/15 0400  O2SAT 83.3    CBC  Recent Labs Lab 11/21/15 0430 11/22/15 0400 11/24/15 0437  HGB 8.4* 8.5* 7.9*  HCT 28.5* 28.9* 25.8*  WBC 4.0 7.5 5.3  PLT 155 163 147*    COAGULATION  Recent Labs Lab 11/19/15 0415 11/23/15 1015  INR 1.13 1.15    CARDIAC    Recent Labs Lab 11/17/15 0931 11/17/15 1800  TROPONINI 2.00* 1.66*   No results for input(s): PROBNP in the last 168 hours.   CHEMISTRY  Recent Labs Lab 11/18/15 0422 11/19/15 0415 11/20/15 0445 11/21/15 0430 11/21/15 1515 11/22/15 0400 11/23/15 0450 11/24/15 0437  NA 142 145 147* 147* 149* 143 138 136  K 3.1* 3.4* 3.6 3.5 3.9 3.7 3.7 3.6  CL 101 107 113* 115* 115* 112* 107 108  CO2 '28 26 24 23 '$ 21* 22 22 20*  GLUCOSE 132* 152* 146* 165* 191* 184* 199* 150*  BUN 100* 107* 99* 100* 100* 103* 118* 141*  CREATININE 1.03* 0.99 1.00 0.92 0.95 1.00 1.07* 1.10*  CALCIUM 8.8* 8.6* 8.2* 8.4* 8.7* 8.4* 8.4* 8.3*  MG 2.3 2.1 2.2 2.3 2.3 2.2  --   --   PHOS 4.4 4.5 4.0 3.6  --  3.6  --   --    Estimated Creatinine Clearance: 40.4 mL/min (by C-G formula based on Cr of 1.1).   LIVER  Recent Labs Lab 11/19/15 0415 11/22/15 0400 11/23/15 0450 11/23/15 1015  AST 67* 61* 115*  --   ALT 36 33 58*  --    ALKPHOS 82 91 106  --   BILITOT 0.7 0.6 0.4  --   PROT 5.2* 5.5* 5.3*  --   ALBUMIN 2.1* 2.2* 2.0*  --   INR 1.13  --   --  1.15     INFECTIOUS  Recent Labs Lab 11/18/15 1122 11/19/15 11/19/15 0415 11/20/15 0445 11/21/15 0057  LATICACIDVEN  --  1.8  --   --  1.7  PROCALCITON 1.73  --  1.36 0.89  --      ENDOCRINE CBG (last 3)   Recent Labs  11/23/15 2023 11/23/15 2321 11/24/15 0416  GLUCAP 183* 169* 142*     IMAGING x48h  -  image(s) personally visualized  -   highlighted in bold Dg Chest Port 1 View  11/23/2015  CLINICAL DATA:  Acute respiratory failure, pneumonia, CHF. EXAM: PORTABLE CHEST 1 VIEW COMPARISON:  Portable chest x-ray of November 22, 2015 FINDINGS: The lungs are adequately inflated. There is persistent increased density in the retrocardiac region on the left with obscuration of the hemidiaphragm. Increased density is also present in the lower hemithorax on the right. There is no pneumothorax The cardiac silhouette remains enlarged. The pulmonary vascularity remains engorged and indistinct. There are post CABG changes. The endotracheal tube tip lies 3.7 cm above the carina. The esophagogastric tube tip projects in gastric body. The left internal jugular venous catheter tip projects over the midportion of the SVC. The small caliber left-sided chest tube appears to lie outside the confines of the S pleural space. IMPRESSION: CHF with bibasilar atelectasis or pneumonia greater on the left than on the right. Small to moderate-sized bilateral pleural effusions. No pneumothorax. The small caliber chest tube lies outside the confines of the pleural space. The other support tubes are in stable position. Electronically Signed   By: David  Martinique M.D.   On: 11/23/2015 07:19    ASSESSMENT / PLAN:  PULMONARY A: Hypercarbic respiratory failure - in setting of CHF, effusions and LLL PNA,  - 2/14 - failed bipap and intubated  - 11/16/15 - failed extubation with onset of  hemoptysis and left ptx and reintubated 2/23.   - CT 2/23 - RML and RLL process. DUplex LE 2/24 - neg dvt   - 11/20/15 ->  Hemoptysis again x 2 in 16h. Meets indication for bronch (hemoptysis)  and trach (prolonged mech vent) pulm edema 2/28  P: For trach now with ENT, much appreciated I consented the family, they are aware of risks death, infection, bleeding and PTX Pos trach will assess trach collar short sprint goal 30 min Neg balance needed, regardless of BUN which is unclear    CARDIOVASCULAR A:  Coronary artery disease. S/p multiple stents and CABG. Hx HTN, CHF  P:  Tele  RENAL A:   UNclear BUN elevation (favor from steroids), despite pos daily balance daily in settting CHF, asymptomatic P:   Renal signed off as unclear BUN and asymptomatic Need to concentrate on her volume status at this stage, lasix low dose addition Again, BUN has gone UP with free water and goss pos daily balance Chem in am  Reduce steroids and follow trend Dc statin  GASTROINTESTINAL A:   GI prophylaxis. Nutrition Constipation  Neg cdiff P:   SUP: pantoprazole. TF - low protein NPO for trach If not weaning off vent in 72 hrs after trach then place PEG (d/w family)  HEMATOLOGIC  Recent Labs Lab 11/21/15 0430 11/22/15 0400 11/24/15 0437  HGB 8.4* 8.5* 7.9*  HCT 28.5* 28.9* 25.8*  WBC 4.0 7.5 5.3  PLT 155 163 147*    A:   Anemia. Thrombocytopenia resolved VTE prophylaxis. PTT slight elevated, doubt from sub q heparin but will dc and repeat P:  SCD's. Monitor CBC sub q heparin dc, repeat ptt  INFECTIOUS A:   Recent UTI and LLL PNA. S/p azactam ending 11/15/15 Pct low  - no fever 11/18/15. Restarted abx 11/18/15 due to  recurrent hemoptysis . Normal flora 2/23. Path clot  2/23 - only blood   P:    aztreonam 11/18/15 > to stop date  ENDOCRINE A:   Diabetes mellitus. P:   SSI. Reduce steroids  NEUROLOGIC A:   #  Baseline   - old strokes . Bed bound x 2009 but normal  conversation. In diapers  deconditioned  P Prn sedation Post trach , fent required RASS goal: 0 Daily WUA. Continue Primidone PT to hoyer to chair [facility] in am for aggressive weaning and pt likely  Ccm time 30 min   Lavon Paganini. Titus Mould, MD, [place] Pgr: [facility]

## 2015-11-24 NOTE — Anesthesia Postprocedure Evaluation (Signed)
Anesthesia Post Note  Patient: Tinsley A Vandenboom  Procedure(s) Performed: Procedure(s) (LRB): TRACHEOSTOMY (N/A)  Patient location during evaluation: ICU Anesthesia Type: General Level of consciousness: sedated Pain management: pain level controlled Vital Signs Assessment: post-procedure vital signs reviewed and stable Respiratory status: patient on ventilator - see flowsheet for VS Cardiovascular status: blood pressure returned to baseline and stable Postop Assessment: no signs of nausea or vomiting Anesthetic complications: no    Last Vitals:  Filed Vitals:   11/24/15 1400 11/24/15 1448  BP: 141/104   Pulse: 58 65  Temp:    Resp: 16 11    Last Pain:  Filed Vitals:   11/24/15 1502  PainSc: Asleep                 Tuleen Mandelbaum L

## 2015-11-24 NOTE — Addendum Note (Signed)
Addendum  created 11/24/15 1545 by Marni Griffon, CRNA   Modules edited: Anesthesia Medication Administration

## 2015-11-24 NOTE — Progress Notes (Signed)
Patient attempting to bite RN while performing restraint checks and ROM.  Patient's daughter at bedside and able to calm patient and stop from trying to bite RN.  Restraints unable to be safely removed at this time. [place], Mitzi Hansen

## 2015-11-24 NOTE — Consult Note (Signed)
ENT CONSULT:  Reason for Consult:VDRF Referring Physician: CCM  [name] is an 82 y.o. female.  HPI: Pt failed extubation, chronic vent dependent  Past Medical History  Diagnosis Date  . Diabetes mellitus   . Hypertension   . Stroke [facility]) Right Side Weakness  . CHF (congestive heart failure) ([place])     2D ECHO, 09/13/2011 - EF 40-45%, normal  . 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 Pain in limb     BILATERAL EXTREMITY VENOUS DUPLEX, 01/08/2011 - no evidence of deep vein or superficial thrombosis or Baker's cyst    Past Surgical History  Procedure Laterality Date  . Cardiac surgery    . Cardiac catheterization  10/27/2004    3 stents placed, 3.5x28 proximally,3.5x33 mid segment, and 3.5x33 in the region of the crux, stenosis being reduced to 0% at proximal and mid segment and being reduced from 80% ot 10% in the stent at the cruxed segment  . Cardiac catheterization  09/26/2004    High grade 95% ostial stenosis in the RCA with 70% mid stenosis, 95% distal stenosis and total occlusion of the PDA with collaterals  . Coronary artery bypass graft  02/24/1999    x4; IMA to distal LAD, left radial to first circ, SVG to first diagonal, SVG to posterior descending coronary artery    Family History  Problem Relation Age of Onset  . Cancer Mother     Lung  . Heart disease Brother   . Hypertension Brother   . Heart disease Brother   . Hypertension Brother     Social History:  reports that she has never smoked. She has never used smokeless tobacco. She reports that she does not drink alcohol or use illicit drugs.  Allergies:  Allergies  Allergen Reactions  . Penicillins Anaphylaxis, Itching and Swelling    Has patient had a PCN reaction causing immediate rash, facial/tongue/throat swelling, SOB or lightheadedness with hypotension:  Yes Has patient had a PCN reaction causing severe rash involving mucus membranes or skin necrosis: No  Has patient had a PCN reaction that required hospitalization: Unknown Has patient had a PCN reaction occurring within the last 10 years: No  If all of the above answers are "NO", then may proceed with Cephalosporin use.     Medications: I have reviewed the patient's current medications.  Results for orders placed or performed during the hospital encounter of 11/07/15 (from the past 48 hour(s))  Glucose, capillary     Status: Abnormal   Collection Time: 11/22/15  8:37 AM  Result Value Ref Range   Glucose-Capillary 153 (H) 65 - 99 mg/dL   Comment 1 Capillary Specimen   Glucose, capillary     Status: Abnormal   Collection Time: 11/22/15  1:14 PM  Result Value Ref Range   Glucose-Capillary 160 (H) 65 - 99 mg/dL   Comment 1 Capillary Specimen   Glucose, capillary     Status: Abnormal   Collection Time: 11/22/15  6:09 PM  Result Value Ref Range   Glucose-Capillary 152 (H) 65 - 99 mg/dL   Comment 1 Capillary Specimen   Glucose, capillary     Status: Abnormal   Collection Time: 11/22/15  8:30 PM  Result Value Ref Range   Glucose-Capillary 177 (H) 65 - 99 mg/dL   Comment 1 Capillary Specimen   Glucose, capillary  Status: Abnormal   Collection Time: 11/23/15 12:44 AM  Result Value Ref Range   Glucose-Capillary 210 (H) 65 - 99 mg/dL   Comment 1 Venous Specimen   Comprehensive metabolic panel     Status: Abnormal   Collection Time: 11/23/15  4:50 AM  Result Value Ref Range   Sodium 138 135 - 145 mmol/L   Potassium 3.7 3.5 - 5.1 mmol/L   Chloride 107 101 - 111 mmol/L   CO2 22 22 - 32 mmol/L   Glucose, Bld 199 (H) 65 - 99 mg/dL   BUN 118 (H) 6 - 20 mg/dL   Creatinine, Ser 1.07 (H) 0.44 - 1.00 mg/dL   Calcium 8.4 (L) 8.9 - 10.3 mg/dL   Total Protein 5.3 (L) 6.5 - 8.1 g/dL   Albumin 2.0 (L) 3.5 - 5.0 g/dL   AST 115 (H) 15 - 41 U/L   ALT 58 (H) 14 - 54 U/L   Alkaline Phosphatase  106 38 - 126 U/L   Total Bilirubin 0.4 0.3 - 1.2 mg/dL   GFR calc non Af Amer 47 (L) >60 mL/min   GFR calc Af Amer 54 (L) >60 mL/min    Comment: (NOTE) The eGFR has been calculated using the CKD EPI equation. This calculation has not been validated in all clinical situations. eGFR's persistently <60 mL/min signify possible Chronic Kidney Disease.    Anion gap 9 5 - 15  Glucose, capillary     Status: Abnormal   Collection Time: 11/23/15  4:54 AM  Result Value Ref Range   Glucose-Capillary 185 (H) 65 - 99 mg/dL   Comment 1 Venous Specimen   Glucose, capillary     Status: Abnormal   Collection Time: 11/23/15  8:25 AM  Result Value Ref Range   Glucose-Capillary 175 (H) 65 - 99 mg/dL   Comment 1 Capillary Specimen   APTT     Status: Abnormal   Collection Time: 11/23/15 10:15 AM  Result Value Ref Range   aPTT 60 (H) 24 - 37 seconds    Comment:        IF BASELINE aPTT IS ELEVATED, SUGGEST PATIENT RISK ASSESSMENT BE USED TO DETERMINE APPROPRIATE ANTICOAGULANT THERAPY.   Protime-INR     Status: None   Collection Time: 11/23/15 10:15 AM  Result Value Ref Range   Prothrombin Time 14.9 11.6 - 15.2 seconds   INR 1.15 0.00 - 1.49  Glucose, capillary     Status: Abnormal   Collection Time: 11/23/15 11:19 AM  Result Value Ref Range   Glucose-Capillary 185 (H) 65 - 99 mg/dL   Comment 1 Capillary Specimen   Glucose, capillary     Status: Abnormal   Collection Time: 11/23/15  4:18 PM  Result Value Ref Range   Glucose-Capillary 134 (H) 65 - 99 mg/dL   Comment 1 Capillary Specimen   Glucose, capillary     Status: Abnormal   Collection Time: 11/23/15  8:23 PM  Result Value Ref Range   Glucose-Capillary 183 (H) 65 - 99 mg/dL   Comment 1 Capillary Specimen   Type and screen Cris[place]     Status: None (Preliminary result)   Collection Time: 11/23/15  9:45 PM  Result Value Ref Range   ABO/RH(D) O POS    Antibody Screen POS    Sample Expiration 11/26/2015    Antibody  Identification HLA ANTIBODY PRESENT    DAT, IgG NEG    Unit Number [id number]    Blood Component Type RED CELLS,LR  Unit division 00    Status of Unit ALLOCATED    Transfusion Status OK TO TRANSFUSE    Crossmatch Result COMPATIBLE    Donor AG Type NEGATIVE FOR E ANTIGEN    Unit Number [id number]    Blood Component Type RED CELLS,LR    Unit division 00    Status of Unit ALLOCATED    Transfusion Status OK TO TRANSFUSE    Crossmatch Result COMPATIBLE    Donor AG Type NEGATIVE FOR E ANTIGEN   Glucose, capillary     Status: Abnormal   Collection Time: 11/23/15 11:21 PM  Result Value Ref Range   Glucose-Capillary 169 (H) 65 - 99 mg/dL   Comment 1 Capillary Specimen   Surgical pcr screen     Status: None   Collection Time: 11/24/15  2:46 AM  Result Value Ref Range   MRSA, PCR NEGATIVE NEGATIVE   Staphylococcus aureus NEGATIVE NEGATIVE    Comment:        The Xpert SA Assay (FDA approved for NASAL specimens in patients over 43 years of age), is one component of a comprehensive surveillance program.  Test performance has been validated by [facility] for patients greater than or equal to 73 year old. It is not intended to diagnose infection nor to guide or monitor treatment.   Glucose, capillary     Status: Abnormal   Collection Time: 11/24/15  4:16 AM  Result Value Ref Range   Glucose-Capillary 142 (H) 65 - 99 mg/dL   Comment 1 Venous Specimen   Basic metabolic panel     Status: Abnormal   Collection Time: 11/24/15  4:37 AM  Result Value Ref Range   Sodium 136 135 - 145 mmol/L   Potassium 3.6 3.5 - 5.1 mmol/L   Chloride 108 101 - 111 mmol/L   CO2 20 (L) 22 - 32 mmol/L   Glucose, Bld 150 (H) 65 - 99 mg/dL   BUN 141 (H) 6 - 20 mg/dL   Creatinine, Ser 1.10 (H) 0.44 - 1.00 mg/dL   Calcium 8.3 (L) 8.9 - 10.3 mg/dL   GFR calc non Af Amer 45 (L) >60 mL/min   GFR calc Af Amer 53 (L) >60 mL/min    Comment: (NOTE) The eGFR has been calculated using the CKD EPI  equation. This calculation has not been validated in all clinical situations. eGFR's persistently <60 mL/min signify possible Chronic Kidney Disease.    Anion gap 8 5 - 15  CBC with Differential/Platelet     Status: Abnormal   Collection Time: 11/24/15  4:37 AM  Result Value Ref Range   WBC 5.3 4.0 - 10.5 K/uL   RBC 2.92 (L) 3.87 - 5.11 MIL/uL   Hemoglobin 7.9 (L) 12.0 - 15.0 g/dL   HCT 25.8 (L) 36.0 - 46.0 %   MCV 88.4 78.0 - 100.0 fL   MCH 27.1 26.0 - 34.0 pg   MCHC 30.6 30.0 - 36.0 g/dL   RDW 18.9 (H) 11.5 - 15.5 %   Platelets 147 (L) 150 - 400 K/uL    Comment: PLATELET COUNT CONFIRMED BY SMEAR   Neutrophils Relative % 87 %   Neutro Abs 4.6 1.7 - 7.7 K/uL   Lymphocytes Relative 9 %   Lymphs Abs 0.5 (L) 0.7 - 4.0 K/uL   Monocytes Relative 4 %   Monocytes Absolute 0.2 0.1 - 1.0 K/uL   Eosinophils Relative 0 %   Eosinophils Absolute 0.0 0.0 - 0.7 K/uL   Basophils Relative 0 %  Basophils Absolute 0.0 0.0 - 0.1 K/uL  APTT     Status: Abnormal   Collection Time: 11/24/15  7:30 AM  Result Value Ref Range   aPTT 59 (H) 24 - 37 seconds    Comment:        IF BASELINE aPTT IS ELEVATED, SUGGEST PATIENT RISK ASSESSMENT BE USED TO DETERMINE APPROPRIATE ANTICOAGULANT THERAPY.   Protime-INR     Status: Abnormal   Collection Time: 11/24/15  7:30 AM  Result Value Ref Range   Prothrombin Time 15.7 (H) 11.6 - 15.2 seconds   INR 1.23 0.00 - 1.49  Glucose, capillary     Status: None   Collection Time: 11/24/15  7:36 AM  Result Value Ref Range   Glucose-Capillary 80 65 - 99 mg/dL   Comment 1 Venous Specimen     Dg Chest Port 1 View  11/24/2015  CLINICAL DATA:  CHF, pleural effusions, bibasilar atelectasis or pneumonia. Intubated patient. EXAM: PORTABLE CHEST 1 VIEW COMPARISON:  Portable chest x-ray of November 23, 2015 FINDINGS: The lungs are adequately inflated. There is no pneumothorax. The interstitial markings remain increased throughout both lungs especially in the mid and lower lung  zones. The cardiac silhouette remains enlarged. The pulmonary vascularity remains engorged. There is a probable left pleural effusion. The endotracheal tube tip lies approximately 4.8 cm above the carina. The esophagogastric tube tip projects below the inferior margin of the image. The left internal jugular venous catheter tip projects over the midportion of the SVC. The patient has undergone previous CABG. The small caliber left-sided chest tube has been removed. External pacemaker defibrillator pads remain present. IMPRESSION: Stable appearance of the chest. There is CHF with bilateral interstitial edema. Bibasilar atelectasis or pneumonia with small pleural effusions layering posteriorly. The support tubes are in reasonable position. Electronically Signed   By: Dannie Woolen  [place] M.D.   On: 11/24/2015 07:10   Dg Chest Port 1 View  11/23/2015  CLINICAL DATA:  Acute respiratory failure, pneumonia, CHF. EXAM: PORTABLE CHEST 1 VIEW COMPARISON:  Portable chest x-ray of November 22, 2015 FINDINGS: The lungs are adequately inflated. There is persistent increased density in the retrocardiac region on the left with obscuration of the hemidiaphragm. Increased density is also present in the lower hemithorax on the right. There is no pneumothorax The cardiac silhouette remains enlarged. The pulmonary vascularity remains engorged and indistinct. There are post CABG changes. The endotracheal tube tip lies 3.7 cm above the carina. The esophagogastric tube tip projects in gastric body. The left internal jugular venous catheter tip projects over the midportion of the SVC. The small caliber left-sided chest tube appears to lie outside the confines of the S pleural space. IMPRESSION: CHF with bibasilar atelectasis or pneumonia greater on the left than on the right. Small to moderate-sized bilateral pleural effusions. No pneumothorax. The small caliber chest tube lies outside the confines of the pleural space. The other support tubes  are in stable position. Electronically Signed   By: Tevion Laforge  [place] M.D.   On: 11/23/2015 07:19    ROS:ROS  Blood pressure 132/44, pulse 51, temperature 97.6 F (36.4 C), temperature source Oral, resp. rate 16, height '5\' 2"'$  (1.575 m), weight 87 kg (191 lb 12.8 oz), SpO2 100 %.  PHYSICAL EXAM: General appearance - OT intubation, sedated Neck - short, normal anatomy  Studies Reviewed:none  Assessment/Plan: Pt for trach. For airway management.  [name], Monique Double 11/24/2015, 8:09 AM

## 2015-11-25 ENCOUNTER — Inpatient Hospital Stay
Admission: AD | Admit: 2015-11-25 | Discharge: 2016-01-04 | Disposition: A | Discharge status: Skilled Nursing Facility | Payer: Medicare Other | Source: Ambulatory Visit | Attending: Internal Medicine | Admitting: Internal Medicine

## 2015-11-25 ENCOUNTER — Institutional Professional Consult (permissible substitution): Admit: 2015-11-25 | Payer: Self-pay | Admitting: Internal Medicine

## 2015-11-25 ENCOUNTER — Encounter (HOSPITAL_COMMUNITY): Payer: Self-pay | Admitting: Otolaryngology

## 2015-11-25 ENCOUNTER — Other Ambulatory Visit (HOSPITAL_COMMUNITY): Payer: Self-pay

## 2015-11-25 DIAGNOSIS — Z431 Encounter for attention to gastrostomy: Secondary | ICD-10-CM | POA: Insufficient documentation

## 2015-11-25 DIAGNOSIS — R109 Unspecified abdominal pain: Secondary | ICD-10-CM

## 2015-11-25 DIAGNOSIS — N19 Unspecified kidney failure: Secondary | ICD-10-CM

## 2015-11-25 DIAGNOSIS — J9509 Other tracheostomy complication: Secondary | ICD-10-CM

## 2015-11-25 DIAGNOSIS — J9690 Respiratory failure, unspecified, unspecified whether with hypoxia or hypercapnia: Secondary | ICD-10-CM | POA: Insufficient documentation

## 2015-11-25 DIAGNOSIS — Z4659 Encounter for fitting and adjustment of other gastrointestinal appliance and device: Secondary | ICD-10-CM

## 2015-11-25 DIAGNOSIS — R112 Nausea with vomiting, unspecified: Secondary | ICD-10-CM

## 2015-11-25 DIAGNOSIS — D689 Coagulation defect, unspecified: Secondary | ICD-10-CM

## 2015-11-25 DIAGNOSIS — K5900 Constipation, unspecified: Secondary | ICD-10-CM

## 2015-11-25 DIAGNOSIS — K567 Ileus, unspecified: Secondary | ICD-10-CM

## 2015-11-25 LAB — BASIC METABOLIC PANEL
Anion gap: 13 (ref 5–15)
BUN: 156 mg/dL — AB (ref 6–20)
CHLORIDE: 105 mmol/L (ref 101–111)
CO2: 20 mmol/L — AB (ref 22–32)
CREATININE: 1.11 mg/dL — AB (ref 0.44–1.00)
Calcium: 8.3 mg/dL — ABNORMAL LOW (ref 8.9–10.3)
GFR calc non Af Amer: 45 mL/min — ABNORMAL LOW (ref >60)
GFR, EST AFRICAN AMERICAN: 52 mL/min — AB (ref >60)
Glucose, Bld: 167 mg/dL — ABNORMAL HIGH (ref 65–99)
POTASSIUM: 3.9 mmol/L (ref 3.5–5.1)
SODIUM: 138 mmol/L (ref 135–145)

## 2015-11-25 LAB — GLUCOSE, CAPILLARY
GLUCOSE-CAPILLARY: 131 mg/dL — AB (ref 65–99)
GLUCOSE-CAPILLARY: 157 mg/dL — AB (ref 65–99)
Glucose-Capillary: 161 mg/dL — ABNORMAL HIGH (ref 65–99)
Glucose-Capillary: 151 mg/dL — ABNORMAL HIGH (ref 65–99)

## 2015-11-25 MED ORDER — INSULIN ASPART 100 UNIT/ML ~~LOC~~ SOLN: SUBCUTANEOUS | Status: DC

## 2015-11-25 MED ORDER — HYDRALAZINE HCL 20 MG/ML IJ SOLN: 10.0000 mg | INTRAMUSCULAR | Status: DC | PRN

## 2015-11-25 MED ORDER — NEPRO/CARBSTEADY PO LIQD
1000.0000 mL | ORAL | Status: DC
  Administered 2015-11-25: 1000 mL
  Filled 2015-11-25 (×2): qty 1000

## 2015-11-25 MED ORDER — MIDAZOLAM HCL 2 MG/2ML IJ SOLN: 1.0000 mg | INTRAMUSCULAR | Status: DC | PRN

## 2015-11-25 MED ORDER — PANTOPRAZOLE SODIUM 40 MG IV SOLR: 40.0000 mg | INTRAVENOUS | Status: AC

## 2015-11-25 MED ORDER — CARVEDILOL 3.125 MG PO TABS: 3.1250 mg | ORAL_TABLET | Freq: Two times a day (BID) | ORAL | Status: AC

## 2015-11-25 MED ORDER — NEPRO/CARBSTEADY PO LIQD: 1000.0000 mL | ORAL | Status: AC

## 2015-11-25 MED ORDER — NYSTATIN 100000 UNIT/ML MT SUSP: 5.0000 mL | Freq: Four times a day (QID) | OROMUCOSAL | Status: DC

## 2015-11-25 MED ORDER — SODIUM CHLORIDE 0.9% FLUSH: 10.0000 mL | Freq: Two times a day (BID) | INTRAVENOUS | Status: DC

## 2015-11-25 MED ORDER — HYDRALAZINE HCL 25 MG PO TABS: 25.0000 mg | ORAL_TABLET | Freq: Three times a day (TID) | ORAL | Status: DC

## 2015-11-25 MED ORDER — SODIUM CHLORIDE 0.9% FLUSH: 10.0000 mL | INTRAVENOUS | Status: DC | PRN

## 2015-11-25 MED ORDER — ISOSORBIDE MONONITRATE 10 MG PO TABS
15.0000 mg | ORAL_TABLET | Freq: Two times a day (BID) | ORAL | Status: DC
  Filled 2015-11-25: qty 1.5

## 2015-11-25 MED ORDER — METHYLPREDNISOLONE SODIUM SUCC 40 MG IJ SOLR: 40.0000 mg | Freq: Every day | INTRAMUSCULAR | Status: DC

## 2015-11-25 MED ORDER — PRIMIDONE 50 MG PO TABS: 50.0000 mg | ORAL_TABLET | Freq: Every day | ORAL | Status: AC

## 2015-11-25 MED ORDER — ISOSORBIDE MONONITRATE 10 MG PO TABS: 15.0000 mg | ORAL_TABLET | Freq: Two times a day (BID) | ORAL | Status: DC

## 2015-11-25 MED ORDER — CHLORHEXIDINE GLUCONATE 0.12% ORAL RINSE (MEDLINE KIT): 15.0000 mL | Freq: Two times a day (BID) | OROMUCOSAL | Status: AC

## 2015-11-25 MED ORDER — FENTANYL CITRATE (PF) 100 MCG/2ML IJ SOLN: 50.0000 ug | INTRAMUSCULAR | Status: AC | PRN

## 2015-11-25 MED ORDER — ALBUTEROL SULFATE (2.5 MG/3ML) 0.083% IN NEBU: 2.5000 mg | INHALATION_SOLUTION | Freq: Four times a day (QID) | RESPIRATORY_TRACT | Status: AC

## 2015-11-25 NOTE — Progress Notes (Signed)
Pt discharged to [facility]. Transferred via bed with RT managing vent and monitored by 2 RNs, telemetry on. Family also in attendance at time of transfer.  Emotional support given to patient and family.

## 2015-11-25 NOTE — Consult Note (Signed)
PULMONARY / CRITICAL CARE MEDICINE   Name: [name] MRN: [id number] DOB: Dec 02, 1930    ADMISSION DATE:  11/25/2015 CONSULTATION DATE:  11/25/2015  REFERRING MD:  [facility]  CHIEF COMPLAINT:  Bleeding Around Tracheostomy  HISTORY OF PRESENT ILLNESS:   Patient known to our service after discharge from intensive care unit today to long-term acute care hospital. Patient status post tracheostomy by ENT on 3/2. Patient has had a long, complicated course with respiratory failure secondary to congestive heart failure, pleural effusions, and left lower lobe pneumonia. Patient was extubated and subsequently reintubated with hemoptysis requiring bronchoscopy for blood clots and plugging of endotracheal tube. The patient subsequently suffered a left-sided pneumothorax requiring chest tube placement. Patient has had significant protein calorie malnutrition as well as thrombocytopenia that had previously resolved. Patient has been noted to have an elevated PTT with a pending mixing study. Also note she is uremic and this is thought to be due to a multitude of factors including diuresis as well as high-protein nutrition. I was called to bedside this afternoon given family and nursing concerns for ongoing bleeding around tracheostomy. Tracheostomy gauze has been replaced multiple times by nursing staff.  PAST MEDICAL HISTORY :  Past Medical History  Diagnosis Date  . Diabetes mellitus   . Hypertension   . Stroke [facility]) Right Side Weakness  . CHF (congestive heart failure) (HCC)     2D ECHO, 09/13/2011 - EF 40-45%, normal  . 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 Pain in limb     BILATERAL EXTREMITY VENOUS DUPLEX, 01/08/2011 - no evidence of deep vein or superficial thrombosis or Baker's cyst    PAST SURGICAL HISTORY: Past Surgical  History  Procedure Laterality Date  . Cardiac surgery    . Cardiac catheterization  10/27/2004    3 stents placed, 3.5x28 proximally,3.5x33 mid segment, and 3.5x33 in the region of the crux, stenosis being reduced to 0% at proximal and mid segment and being reduced from 80% ot 10% in the stent at the cruxed segment  . Cardiac catheterization  09/26/2004    High grade 95% ostial stenosis in the RCA with 70% mid stenosis, 95% distal stenosis and total occlusion of the PDA with collaterals  . Coronary artery bypass graft  02/24/1999    x4; IMA to distal LAD, left radial to first circ, SVG to first diagonal, SVG to posterior descending coronary artery  . Tracheostomy tube placement N/A 11/24/2015    Procedure: TRACHEOSTOMY;  Surgeon: Osborn Coho, MD;  Location: [facility] OR;  Service: ENT;  Laterality: N/A;     Allergies  Allergen Reactions  . Penicillins Anaphylaxis, Itching and Swelling    Has patient had a PCN reaction causing immediate rash, facial/tongue/throat swelling, SOB or lightheadedness with hypotension: Yes Has patient had a PCN reaction causing severe rash involving mucus membranes or skin necrosis: No  Has patient had a PCN reaction that required hospitalization: Unknown Has patient had a PCN reaction occurring within the last 10 years: No  If all of the above answers are "NO", then may proceed with Cephalosporin use.     No current facility-administered medications on file prior to encounter.   Current Outpatient Prescriptions on File Prior to Encounter  Medication Sig  . albuterol (PROVENTIL) (2.5 MG/3ML) 0.083% nebulizer solution Take 3 mLs (2.5 mg total) by nebulization every 6 (  six) hours.  . carvedilol (COREG) 3.125 MG tablet Take 1 tablet (3.125 mg total) by mouth 2 (two) times daily with a meal.  . chlorhexidine gluconate (PERIDEX) 0.12 % solution 15 mLs by Mouth Rinse route 2 (two) times daily.  . fentaNYL (SUBLIMAZE) 100 MCG/2ML injection Inject 1-4 mLs (50-200 mcg total)  into the vein every 2 (two) hours as needed (breakthrough pain).  . folic acid (FOLVITE) 1 MG tablet TAKE ONE TABLET BY MOUTH ONCE DAILY  . hydrALAZINE (APRESOLINE) 20 MG/ML injection Inject 0.5 mLs (10 mg total) into the vein every 4 (four) hours as needed (FOR SYSTOLIC BLOOD PRESSURE MORE THAN 160).  . hydrALAZINE (APRESOLINE) 25 MG tablet Take 1 tablet (25 mg total) by mouth every 8 (eight) hours.  . insulin aspart (NOVOLOG) 100 UNIT/ML injection Correction coverage: Sensitive (thin, NPO, renal) CBG < 70: implement hypoglycemia protocol CBG 70 - 120: 0 units CBG 121 - 150: 1 unit CBG 151 - 200: 2 units CBG 201 - 250: 3 units CBG 251 - 300: 5 units CBG 301 - 350: 7 units CBG 351 - 400: 9 units CBG > 400: call MD and obtain STAT lab verification  . isosorbide mononitrate (ISMO,MONOKET) 10 MG tablet Take 1.5 tablets (15 mg total) by mouth 2 (two) times daily at 10 AM and 5 PM.  . [START ON 11/26/2015] methylPREDNISolone sodium succinate (SOLU-MEDROL) 40 mg/mL injection Inject 1 mL (40 mg total) into the vein daily.  . midazolam (VERSED) 2 MG/2ML SOLN injection Inject 1-4 mLs (1-4 mg total) into the vein every 2 (two) hours as needed for agitation or sedation.  . Nutritional Supplements (FEEDING SUPPLEMENT, NEPRO CARB STEADY,) LIQD Place 1,000 mLs into feeding tube daily.  Marland Kitchen nystatin (MYCOSTATIN) 100000 UNIT/ML suspension Take 5 mLs (500,000 Units total) by mouth 4 (four) times daily.  . pantoprazole (PROTONIX) 40 MG injection Inject 40 mg into the vein daily.  . polysaccharide iron (NIFEREX) 150 MG CAPS capsule Take 150 mg by mouth 2 (two) times daily.   . primidone (MYSOLINE) 50 MG tablet Take 1 tablet (50 mg total) by mouth at bedtime.  . sodium chloride flush (NS) 0.9 % SOLN 10-40 mLs by Intracatheter route every 12 (twelve) hours.  . sodium chloride flush (NS) 0.9 % SOLN 10-40 mLs by Intracatheter route as needed (flush).  . [DISCONTINUED] pregabalin (LYRICA) 75 MG capsule Take 75 mg by  mouth daily.      FAMILY HISTORY:  Family History  Problem Relation Age of Onset  . Cancer Mother     Lung  . Heart disease Brother   . Hypertension Brother   . Heart disease Brother   . Hypertension Brother     SOCIAL HISTORY: Social History  Substance Use Topics  . Smoking status: Never Smoker   . Smokeless tobacco: Never Used  . Alcohol Use: No    REVIEW OF SYSTEMS:  Unable to obtain given tracheostomy.  SUBJECTIVE:   VITAL SIGNS: Reviewed by Dr. Jamison Neighbor at bedside & stable.  HEMODYNAMICS:    VENTILATOR SETTINGS: Vent Mode:  [-] PRVC FiO2 (%):  [40 %] 40 % Set Rate:  [16 bmp] 16 bmp Vt Set:  [400 mL] 400 mL PEEP:  [5 cmH20] 5 cmH20 Plateau Pressure:  [17 cmH20-21 cmH20] 17 cmH20  INTAKE / OUTPUT:    PHYSICAL EXAMINATION: General:  No distress. Family at bedside. Awake. Neuro:  Attends to voice. Moving upper extremities spontaneously. HEENT:  Tracheostomy in place w/ clotted blood and some oozing around  it. No scleral icterus. Moist mucus membranes. Clotting blood on gauze easily moved away & fresh gauze replaced by myself at bedside with no visible arterial bleeding. Cardiovascular:  Regular rhythm. Unable to appreciate JVD given tracheostomy & positioning. Lungs:  On ventilator. Symmetric chest wall rise. No bleed within tracheostomy suction catheter. Abdomen:  Protuberant. Musculoskeletal:  No joint effusion or deformity appreciated. Skin:  Warm & dry. No rash on exposed skin.  LABS:  BMET  Recent Labs Lab 11/23/15 0450 11/24/15 0437 11/25/15 0447  NA 138 136 138  K 3.7 3.6 3.9  CL 107 108 105  CO2 22 20* 20*  BUN 118* 141* 156*  CREATININE 1.07* 1.10* 1.11*  GLUCOSE 199* 150* 167*    Electrolytes  Recent Labs Lab 11/20/15 0445 11/21/15 0430 11/21/15 1515 11/22/15 0400 11/23/15 0450 11/24/15 0437 11/25/15 0447  CALCIUM 8.2* 8.4* 8.7* 8.4* 8.4* 8.3* 8.3*  MG 2.2 2.3 2.3 2.2  --   --   --   PHOS 4.0 3.6  --  3.6  --   --   --      CBC  Recent Labs Lab 11/21/15 0430 11/22/15 0400 11/24/15 0437  WBC 4.0 7.5 5.3  HGB 8.4* 8.5* 7.9*  HCT 28.5* 28.9* 25.8*  PLT 155 163 147*    Coag's  Recent Labs Lab 11/19/15 0415 11/23/15 1015 11/24/15 0730  APTT  --  60* 59*  INR 1.13 1.15 1.23    Sepsis Markers  Recent Labs Lab 11/19/15 11/19/15 0415 11/20/15 0445 11/21/15 0057  LATICACIDVEN 1.8  --   --  1.7  PROCALCITON  --  1.36 0.89  --     ABG No results for input(s): PHART, PCO2ART, PO2ART in the last 168 hours.  Liver Enzymes  Recent Labs Lab 11/19/15 0415 11/22/15 0400 11/23/15 0450  AST 67* 61* 115*  ALT 36 33 58*  ALKPHOS 82 91 106  BILITOT 0.7 0.6 0.4  ALBUMIN 2.1* 2.2* 2.0*    Cardiac Enzymes No results for input(s): TROPONINI, PROBNP in the last 168 hours.  Glucose  Recent Labs Lab 11/24/15 1611 11/24/15 2032 11/25/15 0032 11/25/15 0429 11/25/15 0825 11/25/15 1251  GLUCAP 127* 144* 157* 161* 131* 151*    Imaging Dg Abd Portable 1v  11/25/2015  CLINICAL DATA:  Encounter for nasogastric tube placement, hypertension, diabetes mellitus, CHF, stroke EXAM: PORTABLE ABDOMEN - 1 VIEW COMPARISON:  Portable exam 1515 hours compared to 11/17/2015 FINDINGS: Tip of nasogastric tube projects over proximal stomach. Air-filled nondistended loops of large and small bowel throughout abdomen. No definite bowel wall thickening or obstruction. Cardiac silhouette appears enlarged. Extensive atherosclerotic calcifications aorta. Bones demineralized. Bibasilar pulmonary opacities again seen. IMPRESSION: Nonobstructive bowel gas pattern. Tip of nasogastric tube projects over the proximal stomach. Electronically Signed   By: Ulyses Southward M.D.   On: 11/25/2015 15:32    SIGNIFICANT EVENTS: 3/2 - Tracheostomy placement by ENT 3/3 - Discharge to LT[facility]  LINES/TUBES: Shiley Trach 8.0 3/2 (by ENT in OR)>>> L IJ CVL 2/14>>> NGT R Nare 3/2>>>  ASSESSMENT / PLAN:  82 year old female with discharged  to long-term acute care facility today. Patient required tracheostomy placement on 3/2 by ENT for acute respiratory failure. Hospital course complicated by hemoptysis and left-sided pneumothorax. Patient does have evidence of bleeding from around tracheostomy site which does appear to be non-arterial. Respiratory status at this time appears stable. In reviewing the patient's labs she does have an elevated PTT with mixing time pending. Additionally she is  uremic which could be contributing to platelet dysfunction with last platelet count 147 on 3/2" mildly thrombocytopenic.  1. Bleeding around tracheostomy: Gauze were placed by myself at bedside. Administering DDAVP 0.3 g/kilogram IV 1. Also transfusing 1 unit of platelets. Recommend leaving gauze in place for now to promote hemostasis. 2. Elevated PTT with uremia: Transfusing 1 unit of platelets. Administering DDAVP IV 1 for urinary bleeding. Awaiting mixing study.   We will reassess the patient on 3/6. Please contact the on-call physician if there are any acute issues or we can be of any further assistance before then.   Remainder of care per primary service.   FAMILY  - Updates: Daughter & other family updated at bedside by Dr. Jamison NeighborNestor 3/3.  - Inter-disciplinary family meet or Palliative Care meeting due by:  3/10  I have spent a total of 31 minutes of critical care time today reviewing the patient's chart, discussing the plan of care with the patient's nurse as well as family at bedside, and caring for the patient.  Donna ChristenJennings E. Jamison NeighborNestor, M.D. [facility] Pager:  [phone] After 3pm or if no response, call [phone] 11/25/2015, 6:27 PM

## 2015-11-25 NOTE — Discharge Summary (Signed)
Physician Discharge Summary  Patient ID: [name] MRN: [id number] DOB/AGE: October 07, 1930 82 y.o.  Admit date: 11/07/2015 Discharge date: 11/25/2015    Discharge Diagnoses:  Hypercarbic Respiratory Failure secondary to CHF, pleural effusions, LLL PNA Hemoptysis Tracheostomy Status  Pneumothorax  History of CAD status post multiple stents & CABG HTN CHF  Unclear BUN Elevation  Protein Calorie Malnutrition  Constipation  Anemia  Thrombocytopenia - resolved Elevated PTT UTI DM  Hx CVA - prior to admit baseline bed  bound since 2009, in diapers but normal in conversation                                                                         DISCHARGE PLAN BY DIAGNOSIS     Hypercarbic Respiratory Failure secondary to CHF, pleural effusions, LLL PNA Hemoptysis Tracheostomy Status  Pneumothorax - resolved  Discharge Plan: Begin ventilator wean protocol  PRVC 400 / 16 / PEEP 5 / 40% FiO2  Goal to progress to ATC as tolerated Intermittent CXR to monitor effusions / airspace disease Goal for even balance as BP / renal function permit  Albuterol Q6 Wean steroids to off over 10 days Trach care per protocol   History of CAD status post multiple stents & CABG HTN CHF   Discharge Plan: Telemetry monitoring  Continue Imdur, coreg  Unclear BUN Elevation  UTI - treated / resolved  Discharge Plan: Monitor BMP / UOP  Replace electrolytes as indicated  Trend BUN  Goal even balance  Protein Calorie Malnutrition  Constipation   Discharge Plan: Continue TF, Nepro for goal rate of 68m/hr.  Increase as tolerated  Continue PPI while on vent   Anemia  Thrombocytopenia - resolved Elevated PTT  Discharge Plan: Trend CBC Await PTT mixing study >> will need to follow up  SCD's for DVT prophylaxis   DM   Discharge Plan: Continue SSI   Hx CVA - prior to admit baseline bed  bound since 2009, in diapers but normal in conversation  Discharge Plan: Supportive  care Push PT efforts                    DISCHARGE SUMMARY   Monique GOMESis a 82y.o. y/o female with a PMH of DM, HTN, CVA with residual right-sided weakness, chronic anemia, chronic kidney disease & CHF who was admitted on 11/07/15 with altered mental status.  [facility] on Levaquin. After discharge the family noted she had been progressively more somnolent. Initial CTA and MRI demonstrated old stroke, age-related changes but no acute abnormality. She was found to have hypercarbic respiratory failure and placed on BiPAP but failed to improve requiring intubation. Initial chest x-ray demonstrated bilateral pleural effusions and basilar opacities. Cardiology was consulted for CHF. She was treated with a Lasix drip.  She required Precedex for agitation/sedation.  Patient required prolonged ventilatory support. ICU course complicated by anemia, multiple failed SBT attempts, acute on chronic kidney disease (serum creatinine improved however BUN continues to rise).  Echo assessment demonstrated an LVEF of 35%. It is balance was achieved and patient tolerated SBT. She was subsequently extubated on 11/17/15.  Post extubation  the patient experienced massive hemoptysis and required emergent bronchoscopy with reintubation. Blood was noted on bronchoscopy with a large blood clot was removed. However there was no evidence of active bleeding.  Left chest tube was placed for pneumothorax (subsequently removed).  She was transfused for acute blood loss anemia in the setting of hemoptysis. Hemoptysis resolved as of 2/25. The patient continued to have periods of intermittent agitation requiring sedation. Multiple discussions regarding goals of care held with family and the patient continues to be a full code despite poor prognosis. She prolonged ventilatory needs, the patient underwent tracheostomy placement per ENT (3/2).  3/1  lower extremity Dopplers assessed and negative for DVT.  Given BUN elevation out of proportion to creatinine, nephrology was consulted for evaluation. It was felt elevated BUN is multifactorial in the setting of significant diuresis, high protein to feeding and significant hemoptysis that led to progressive anemia. 2 feedings were adjusted to Nepro. IV fluids were reduced. Steroids recommended to be tapered to off over 10 days as also may be contributing to elevated BUN. She also had noted rising PTT despite heparin being discontinued. A PTT mixing study is pending at time of discharge.  The patient was considered a difficult IV access during hospitalization and central line remains. Further discussion regarding replacement of central line with PICC line can be addressed at [facility] pending further nephrology review.  Patient continues to require mechanical ventilation, weaning efforts, significant physical therapy needs warranting discharged to [name].              CULTURES: 11/17/15 - resp culture - normal flora  ANTIBIOTICS: Vanc 02/14 >> 2/16, 2/24 >>2/26 Azactam 02/14 >> 2/21, 2/24 >> 3/02  LINES/TUBES: ETT 02/14 >> 3/2 L IJ TLC 2/14 >> difficult access, leave upon discharge >>  Left chest tube 2/23 >> water seal 2/27 >> 3/1 Trach 3/2 >>   SIGNIFICANT EVENTS: CT head 2/13. Chronic age-related changes, old infarcts. No acute abnormality. MRI head 2/13. No acute intracranial process ................................ 2/14 Admit with altered mental status, hypercarbic, failed bipap / intubated - Chest x-ray 2/14 bilateral pleural effusions, basilar opacities 2/17 Remains on lasix gtt, precedex  2/19  No acute events overnight. Pt more alert/awake this am. Follows commands (when demonstrated first)  11/14/15- pn prn fentanyl. Just started sbt . Daughter at bedside. RN reports patient exhausted and edematous and sometimes agitated . Restart scheduled lasix  11/15/15 - ET tube too low. Got 1  unit prbc for hg < 7gm%. Stool OB x 1 negative. Failed SBT. Lasix held by cards due o cxr improvement and cVP 5. Improving creat but worsening BUN to 90s. Echo pending. Daughte rreports patien bed bound and in diapers since 2009 but normal mental status . Mild dysphagia at baseline for hard solids +  11/16/15 - EF 35%. CVP up again and cards gave 1 dose lasix. BUN still high 80s. CXr stil congested. Doing SBT x 3h off precedex. Normal WUA per RN. Looks exhausted . FOBT x 2 ngative . DPOA indicating full code + full med care  11/17/15 - massive hemoptysis post extubation - new finding with emergent bronch and intubation and s/p l;eft ches ttube for pneumothorax. This AM -> on prn sedation. Not on pressors. BUN continues to rise. No hemoptysis this AM  11/18/15 - On chart reviw daughter [name] showing signs of spiritical distress - yelling at RN and feeling mom not being taken care of well. GBM an RF negative yesterd. BUn/creat rising. Mild intermittent hemoptysis +  via ETT tube. Daughter [name] DPOA at bedside. Had me update patient daughter in law Monique Croft RN over speaker phone. 40% fio3  Daughter [name] -> indicated no tracheostomy to PCCM MD. And per RN [name] -> other daughter [name] also indicated no tracheostomy but at same tme they are indicating full code and full medical care  11/19/15 - No further hemoptysis. Got 1 unit prbc overnight.   Per RN [name] - reports of overnigth intermittent agitation but currently RASS -3 on diprivan gtt. Per Daugther [name] (non dpoa)- patient was without agitation till 3am and is upset that RN reporting that there was agitation. [name] does NOT want RN [name] taking care of patient Unclear to MD if family member upset that patient is sedated or she got agitated. Not on pressors.   BUN continues to climb - renal follwing. Duplex LE - negative for DVT. Path from 11/17/15 - shows blood clot; no malignant cells.   3/1- extensive d/w family, poor prognosis voiced,  they wish trach. I met with daughters and son and daughter in law, I explained her recent [name] stay and failures to extubate or wean well . I explained that trach and vent dependence in setting of bed ridden and her age portends a poor prognosis to progress . Risk ulcers bed soars, PNA and suffering. They want trach . I explained that she cant go home unless in future she is OFF vent ( which is possible) or stable vent settings. Social work also present, they wish [facility] for aggressive PT and weaning.     Discharge Exam: General: Chronically ill-appearing female, weak, lying in bed Neuro: RASS 0, FC, eyes open, interacts with family, non-English speaking HEENT: no jvd, trach midline with bloody secretions at site, no active hemorrhage, NG tube in place  Cardiovascular: RRR, Nl S1/S2 -M Lungs: Nonlabored on mechanical ventilation, lungs bilaterally coarse Abdomen: Soft, NT, ND and +BS. Ext: 1+ edema Skin: Intact.   Filed Vitals:   11/25/15 0827 11/25/15 0900 11/25/15 1000 11/25/15 1100  BP:  92/45 141/47 138/41  Pulse:  80 84 83  Temp:      TempSrc:      Resp:  _0 Height:      Weight:      SpO2: 100% 100% 100% 100%     Discharge Labs  BMET  Recent Labs Lab 11/19/15 0415 11/20/15 0445 11/21/15 0430 11/21/15 1515 11/22/15 0400 11/23/15 0450 11/24/15 0437 11/25/15 0447  NA 145 147* 147* 149* 143 138 136 138  K 3.4* 3.6 3.5 3.9 3.7 3.7 3.6 3.9  CL 107 113* 115* 115* 112* 107 108 105  CO2 _1 21* 22 22 20* 20*  GLUCOSE 152* 146* 165* 191* 184* 199* 150* 167*  BUN 107* 99* 100* 100* 103* 118* 141* 156*  CREATININE 0.99 1.00 0.92 0.95 1.00 1.07* 1.10* 1.11*  CALCIUM 8.6* 8.2* 8.4* 8.7* 8.4* 8.4* 8.3* 8.3*  MG 2.1 2.2 2.3 2.3 2.2  --   --   --   PHOS 4.5 4.0 3.6  --  3.6  --   --   --     CBC  Recent Labs Lab 11/21/15 0430 11/22/15 0400 11/24/15 0437  HGB 8.4* 8.5* 7.9*  HCT 28.5* 28.9* 25.8*  WBC 4.0 7.5 5.3  PLT 155 163 147*     Anti-Coagulation  Recent Labs Lab 11/19/15 0415 11/23/15 1015 11/24/15 0730  INR 1.13 1.15 1.23        Discharge Instructions  Discharge instructions    Complete by:  As directed   See discharge summary - discharging to [facility]     Increase activity slowly    Complete by:  As directed                Medication List    STOP taking these medications        allopurinol 100 MG tablet  Commonly known as:  ZYLOPRIM     atorvastatin 40 MG tablet  Commonly known as:  LIPITOR     clopidogrel 75 MG tablet  Commonly known as:  PLAVIX     colchicine 0.6 MG tablet     COMBIVENT 18-103 MCG/ACT inhaler  Generic drug:  albuterol-ipratropium     CRANBERRY CONCENTRATE PO     isosorbide mononitrate 60 MG 24 hr tablet  Commonly known as:  IMDUR  Replaced by:  isosorbide mononitrate 10 MG tablet     JANUVIA 50 MG tablet  Generic drug:  sitaGLIPtin     metoprolol 50 MG tablet  Commonly known as:  LOPRESSOR     nitroGLYCERIN 0.4 MG SL tablet  Commonly known as:  NITROSTAT     omega-3 acid ethyl esters 1 g capsule  Commonly known as:  LOVAZA     pantoprazole 40 MG tablet  Commonly known as:  PROTONIX  Replaced by:  pantoprazole 40 MG injection     senna 8.6 MG tablet  Commonly known as:  SENOKOT     torsemide 20 MG tablet  Commonly known as:  DEMADEX      TAKE these medications        albuterol (2.5 MG/3ML) 0.083% nebulizer solution  Commonly known as:  PROVENTIL  Take 3 mLs (2.5 mg total) by nebulization every 6 (six) hours.     carvedilol 3.125 MG tablet  Commonly known as:  COREG  Take 1 tablet (3.125 mg total) by mouth 2 (two) times daily with a meal.     chlorhexidine gluconate 0.12 % solution  Commonly known as:  PERIDEX  15 mLs by Mouth Rinse route 2 (two) times daily.     feeding supplement (NEPRO CARB STEADY) Liqd  Place 1,000 mLs into feeding tube daily.     fentaNYL 100 MCG/2ML injection  Commonly known as:  SUBLIMAZE  Inject 1-4 mLs  (50-200 mcg total) into the vein every 2 (two) hours as needed (breakthrough pain).     folic acid 1 MG tablet  Commonly known as:  FOLVITE  TAKE ONE TABLET BY MOUTH ONCE DAILY     hydrALAZINE 20 MG/ML injection  Commonly known as:  APRESOLINE  Inject 0.5 mLs (10 mg total) into the vein every 4 (four) hours as needed (FOR SYSTOLIC BLOOD PRESSURE MORE THAN 160).     hydrALAZINE 25 MG tablet  Commonly known as:  APRESOLINE  Take 1 tablet (25 mg total) by mouth every 8 (eight) hours.     insulin aspart 100 UNIT/ML injection  Commonly known as:  novoLOG  Correction coverage: Sensitive (thin, NPO, renal) CBG < 70: implement hypoglycemia protocol CBG 70 - 120: 0 units CBG 121 - 150: 1 unit CBG 151 - 200: 2 units CBG 201 - 250: 3 units CBG 251 - 300: 5 units CBG 301 - 350: 7 units CBG 351 - 400: 9 units CBG > 400: call MD and obtain STAT lab verification     isosorbide mononitrate 10 MG tablet  Commonly known as:  ISMO,MONOKET  Take 1.5 tablets (15  mg total) by mouth 2 (two) times daily at 10 AM and 5 PM.     methylPREDNISolone sodium succinate 40 mg/mL injection  Commonly known as:  SOLU-MEDROL  Inject 1 mL (40 mg total) into the vein daily.  Start taking on:  11/26/2015     midazolam 2 MG/2ML Soln injection  Commonly known as:  VERSED  Inject 1-4 mLs (1-4 mg total) into the vein every 2 (two) hours as needed for agitation or sedation.     nystatin 100000 UNIT/ML suspension  Commonly known as:  MYCOSTATIN  Take 5 mLs (500,000 Units total) by mouth 4 (four) times daily.     pantoprazole 40 MG injection  Commonly known as:  PROTONIX  Inject 40 mg into the vein daily.     polysaccharide iron 150 MG capsule  Generic drug:  iron polysaccharides  Take 150 mg by mouth 2 (two) times daily.     primidone 50 MG tablet  Commonly known as:  MYSOLINE  Take 1 tablet (50 mg total) by mouth at bedtime.     sodium chloride flush 0.9 % Soln  Commonly known as:  NS  10-40 mLs by Intracatheter  route every 12 (twelve) hours.     sodium chloride flush 0.9 % Soln  Commonly known as:  NS  10-40 mLs by Intracatheter route as needed (flush).          Disposition:  [name] [place]  Discharged Condition: SAMANDA BUSKE has met maximum benefit of inpatient care and is medically stable and cleared for discharge.  Patient is pending follow up as above.      Time spent on disposition:  Greater than 35 minutes.   Signed: Noe Gens, NP-C  Pulmonary & Critical Care Pgr: [phone] Office: [phone]    STAFF NOTE: Linwood Dibbles, MD FACP have personally reviewed patient's available data, including medical history, events of note, physical examination and test results as part of my evaluation. I have discussed with resident/NP and other care providers such as pharmacist, RN and RRT. In addition, I personally evaluated patient and elicited key findings of: more awake, more comfortable with trach, NO PTX, trach placed well, coarse BS , weaning to goal trach collar today 2 hours may if able, BUN may be from steroids, have reduced today signbificantly and follow trebnd, hold further lasix, ptt was unclear elevated, i will send mixing study to assess hep affect vs inhibitor vs def, may need FFP if trach bleeding noted to worsen, dc foley and assess output, i updated daughter at bedside and other daughter on phone   Lavon Paganini. Titus Mould, MD, [place] Pgr: [place] [facility] 11/25/2015 3:34 PM

## 2015-11-25 NOTE — Progress Notes (Signed)
fam had toured [facility] 2 days ago and wanted pt to [facility] to [facility] after trach inserted. Trach inserted 3-2 and per pccm pt ready today. fam has signed cobra/transfer/emtala form and signed paperwork for [facility]. Staff will call report and transfer to [facility] w dc summary.

## 2015-11-25 NOTE — Progress Notes (Signed)
Nutrition Follow-up  DOCUMENTATION CODES:   Obesity unspecified  INTERVENTION:   Increase Nepro to 40 ml/hr via NG tube D/C Prostat Provides: 1728 kcal, 77 grams protein, and 697 ml H2O.   NUTRITION DIAGNOSIS:   Inadequate oral intake related to inability to eat as evidenced by NPO status. Ongoing.   GOAL:   Patient will meet greater than or equal to 90% of their needs Met.   MONITOR:   TF tolerance, Skin, I & O's, Labs  ASSESSMENT:   [place] female with hx of DM, stroke, HTN, CHF (EF 50%) was admitted with encephalopathy. Pt recently hospitalized at [facility] for abd pain and dx with UTI. Pt with respiratory failure in setting of CHF, effusions and LLL PNA who failed BiPAP, intubated 2/14.  Patient is currently intubated on ventilator support MV: 6.5 L/min Temp (24hrs), Avg:97.7 F (36.5 C), Min:97.3 F (36.3 C), Max:98.3 F (36.8 C)  3/2: trach and NG placed in OR Pt transitioning from vent to trach collar.  Labs reviewed: BUN continues to rise 156 CBG's: 131-144  Will continue to re-evaluate estimated needs.   Diet Order:  Diet NPO time specified  Skin:  Wound (see comment) (stage II sacrum)  Last BM:  3/1  Height:   Ht Readings from Last 1 Encounters:  11/20/15 '5\' 2"'$  (1.575 m)    Weight:   Wt Readings from Last 1 Encounters:  11/20/15 191 lb 12.8 oz (87 kg)    Ideal Body Weight:  50 kg  BMI:  Body mass index is 35.07 kg/(m^2).  Estimated Nutritional Needs:   Kcal:  1600-1800  Protein:  70-80 grams  Fluid:  > 1.6 L/day  EDUCATION NEEDS:   No education needs identified at this time  [place], [place], [place] Pager [phone] After Hours Pager

## 2015-11-26 ENCOUNTER — Other Ambulatory Visit (HOSPITAL_COMMUNITY): Payer: Self-pay

## 2015-11-26 LAB — PROTIME-INR
INR: 1.25 (ref 0.00–1.49)
PROTHROMBIN TIME: 15.8 s — AB (ref 11.6–15.2)

## 2015-11-26 LAB — CBC
HCT: 20.1 % — ABNORMAL LOW (ref 36.0–46.0)
Hemoglobin: 6.4 g/dL — CL (ref 12.0–15.0)
MCH: 28.2 pg (ref 26.0–34.0)
MCHC: 31.8 g/dL (ref 30.0–36.0)
MCV: 88.5 fL (ref 78.0–100.0)
PLATELETS: 203 10*3/uL (ref 150–400)
RBC: 2.27 MIL/uL — ABNORMAL LOW (ref 3.87–5.11)
RDW: 20.1 % — AB (ref 11.5–15.5)
WBC: 29.8 10*3/uL — ABNORMAL HIGH (ref 4.0–10.5)

## 2015-11-26 LAB — TYPE AND SCREEN
ABO/RH(D): O POS
Antibody Screen: POSITIVE
DAT, IgG: NEGATIVE
Donor AG Type: NEGATIVE
Donor AG Type: NEGATIVE
Unit division: 0
Unit division: 0

## 2015-11-26 LAB — COMPREHENSIVE METABOLIC PANEL
ALT: 61 U/L — AB (ref 14–54)
ANION GAP: 15 (ref 5–15)
AST: 126 U/L — ABNORMAL HIGH (ref 15–41)
Albumin: 2.1 g/dL — ABNORMAL LOW (ref 3.5–5.0)
Alkaline Phosphatase: 95 U/L (ref 38–126)
BUN: 178 mg/dL — ABNORMAL HIGH (ref 6–20)
CHLORIDE: 106 mmol/L (ref 101–111)
CO2: 18 mmol/L — ABNORMAL LOW (ref 22–32)
CREATININE: 1.42 mg/dL — AB (ref 0.44–1.00)
Calcium: 8.3 mg/dL — ABNORMAL LOW (ref 8.9–10.3)
GFR, EST AFRICAN AMERICAN: 39 mL/min — AB (ref >60)
GFR, EST NON AFRICAN AMERICAN: 33 mL/min — AB (ref >60)
Glucose, Bld: 160 mg/dL — ABNORMAL HIGH (ref 65–99)
POTASSIUM: 3.7 mmol/L (ref 3.5–5.1)
Sodium: 139 mmol/L (ref 135–145)
Total Bilirubin: 0.9 mg/dL (ref 0.3–1.2)
Total Protein: 5 g/dL — ABNORMAL LOW (ref 6.5–8.1)

## 2015-11-26 LAB — PREPARE PLATELET PHERESIS: Unit division: 0

## 2015-11-26 LAB — PREPARE RBC (CROSSMATCH)

## 2015-11-26 LAB — HEMOGLOBIN AND HEMATOCRIT, BLOOD
HEMATOCRIT: 26.6 % — AB (ref 36.0–46.0)
HEMOGLOBIN: 8.4 g/dL — AB (ref 12.0–15.0)

## 2015-11-26 LAB — TSH: TSH: 1.678 u[IU]/mL (ref 0.350–4.500)

## 2015-11-26 MED ORDER — SODIUM CHLORIDE 0.9 % IV SOLN: Freq: Once | INTRAVENOUS | Status: DC

## 2015-11-27 LAB — PROTIME-INR
INR: 2.22 — ABNORMAL HIGH (ref 0.00–1.49)
PROTHROMBIN TIME: 24.4 s — AB (ref 11.6–15.2)

## 2015-11-27 LAB — CBC
HEMATOCRIT: 26.4 % — AB (ref 36.0–46.0)
HEMOGLOBIN: 8.5 g/dL — AB (ref 12.0–15.0)
MCH: 26.7 pg (ref 26.0–34.0)
MCHC: 32.2 g/dL (ref 30.0–36.0)
MCV: 83 fL (ref 78.0–100.0)
PLATELETS: 144 10*3/uL — AB (ref 150–400)
RBC: 3.18 MIL/uL — ABNORMAL LOW (ref 3.87–5.11)
RDW: 18.8 % — ABNORMAL HIGH (ref 11.5–15.5)
WBC: 13.6 10*3/uL — AB (ref 4.0–10.5)

## 2015-11-27 LAB — TYPE AND SCREEN
ABO/RH(D): O POS
ANTIBODY SCREEN: NEGATIVE
DAT, IGG: NEGATIVE
Donor AG Type: NEGATIVE
Donor AG Type: NEGATIVE
UNIT DIVISION: 0
UNIT DIVISION: 0

## 2015-11-27 LAB — BASIC METABOLIC PANEL
Anion gap: 12 (ref 5–15)
BUN: 159 mg/dL — ABNORMAL HIGH (ref 6–20)
CHLORIDE: 109 mmol/L (ref 101–111)
CO2: 20 mmol/L — AB (ref 22–32)
Calcium: 8.4 mg/dL — ABNORMAL LOW (ref 8.9–10.3)
Creatinine, Ser: 1.41 mg/dL — ABNORMAL HIGH (ref 0.44–1.00)
GFR calc non Af Amer: 34 mL/min — ABNORMAL LOW (ref >60)
GFR, EST AFRICAN AMERICAN: 39 mL/min — AB (ref >60)
Glucose, Bld: 167 mg/dL — ABNORMAL HIGH (ref 65–99)
POTASSIUM: 3.9 mmol/L (ref 3.5–5.1)
Sodium: 141 mmol/L (ref 135–145)

## 2015-11-27 LAB — PTT FACTOR INHIBITOR (MIXING STUDY)
APTT 1 1 NORMAL PLASMA: 27.2 s (ref 22.9–30.2)
APTT 11 MIX SALINE: 85.1 s
APTT 11 NP MIX, 60 MIN, INCUB.: 27.4 s (ref 22.9–30.2)
aPTT 1:1 NP Incub. Mix Ctl: 28.1 s (ref 22.9–30.2)
aPTT: 37.3 s — ABNORMAL HIGH (ref 22.9–30.2)

## 2015-11-27 LAB — PHOSPHORUS: Phosphorus: 5.5 mg/dL — ABNORMAL HIGH (ref 2.5–4.6)

## 2015-11-27 LAB — MAGNESIUM: Magnesium: 2.3 mg/dL (ref 1.7–2.4)

## 2015-11-28 ENCOUNTER — Other Ambulatory Visit (HOSPITAL_COMMUNITY): Payer: Self-pay

## 2015-11-28 DIAGNOSIS — Z930 Tracheostomy status: Secondary | ICD-10-CM

## 2015-11-28 DIAGNOSIS — J9602 Acute respiratory failure with hypercapnia: Secondary | ICD-10-CM

## 2015-11-28 DIAGNOSIS — J9601 Acute respiratory failure with hypoxia: Secondary | ICD-10-CM

## 2015-11-28 LAB — BASIC METABOLIC PANEL
ANION GAP: 13 (ref 5–15)
BUN: 154 mg/dL — ABNORMAL HIGH (ref 6–20)
CHLORIDE: 111 mmol/L (ref 101–111)
CO2: 22 mmol/L (ref 22–32)
CREATININE: 1.36 mg/dL — AB (ref 0.44–1.00)
Calcium: 8.6 mg/dL — ABNORMAL LOW (ref 8.9–10.3)
GFR calc non Af Amer: 35 mL/min — ABNORMAL LOW (ref >60)
GFR, EST AFRICAN AMERICAN: 41 mL/min — AB (ref >60)
Glucose, Bld: 150 mg/dL — ABNORMAL HIGH (ref 65–99)
POTASSIUM: 3.8 mmol/L (ref 3.5–5.1)
SODIUM: 146 mmol/L — AB (ref 135–145)

## 2015-11-28 LAB — PROTIME-INR
INR: 1.12 (ref 0.00–1.49)
Prothrombin Time: 14.6 seconds (ref 11.6–15.2)

## 2015-11-28 LAB — MAGNESIUM: MAGNESIUM: 2.6 mg/dL — AB (ref 1.7–2.4)

## 2015-11-28 LAB — CBC
HEMATOCRIT: 26.2 % — AB (ref 36.0–46.0)
HEMOGLOBIN: 8.3 g/dL — AB (ref 12.0–15.0)
MCH: 26.7 pg (ref 26.0–34.0)
MCHC: 31.7 g/dL (ref 30.0–36.0)
MCV: 84.2 fL (ref 78.0–100.0)
Platelets: 142 10*3/uL — ABNORMAL LOW (ref 150–400)
RBC: 3.11 MIL/uL — AB (ref 3.87–5.11)
RDW: 19.5 % — ABNORMAL HIGH (ref 11.5–15.5)
WBC: 12.6 10*3/uL — AB (ref 4.0–10.5)

## 2015-11-28 LAB — PHOSPHORUS: PHOSPHORUS: 4.5 mg/dL (ref 2.5–4.6)

## 2015-11-28 NOTE — Progress Notes (Signed)
PULMONARY / CRITICAL CARE MEDICINE   Name: [name] MRN: [id number] DOB: [birth year]    ADMISSION DATE:  11/25/2015 CONSULTATION DATE:  11/25/2015  REFERRING MD:  [facility]  CHIEF COMPLAINT:  Bleeding Around Tracheostomy  SUBJECTIVE:  No distress  VITAL SIGNS: 97.8 hr 98 rr 17 141/76 sats 100%  PHYSICAL EXAMINATION: General:  No distress. Family at bedside. Awake. Neuro:  Attends to voice. Moving upper extremities spontaneously. HEENT:  Tracheostomy in place w/ dried blood around stoma. Dressing removed. No scleral icterus. Moist mucus membranes. Cardiovascular:  Regular rhythm. Unable to appreciate JVD given tracheostomy & positioning. Lungs:  On ventilator, no distress. Good Vts on vent. Scattered rhonchi. Left dressing removed.  Abdomen:  Protuberant. Musculoskeletal:  No joint effusion or deformity appreciated. Skin:  Warm & dry. No rash on exposed skin.  LABS:  BMET  Recent Labs Lab 11/26/15 0520 11/27/15 0624 11/28/15 0550  NA 139 141 146*  K 3.7 3.9 3.8  CL 106 109 111  CO2 18* 20* 22  BUN 178* 159* 154*  CREATININE 1.42* 1.41* 1.36*  GLUCOSE 160* 167* 150*    CBC  Recent Labs Lab 11/26/15 0520 11/26/15 2123 11/27/15 0624 11/28/15 0550  WBC 29.8*  --  13.6* 12.6*  HGB 6.4* 8.4* 8.5* 8.3*  HCT 20.1* 26.6* 26.4* 26.2*  PLT 203  --  144* 142*   Glucose  Recent Labs Lab 11/24/15 1611 11/24/15 2032 11/25/15 0032 11/25/15 0429 11/25/15 0825 11/25/15 1251  GLUCAP 127* 144* 157* 161* 131* 151*    Imaging No results found.  SIGNIFICANT EVENTS: 3/2 - Tracheostomy placement by ENT 3/3 - Discharge to [facility]  LINES/TUBES: Shiley Trach 8.0 3/2 (by ENT in OR)>>> L IJ CVL 2/14>>> NGT R Nare 3/2>>>  ASSESSMENT / PLAN:    Tracheostomy dependence  Bleeding around tracheostomy in setting of uremia:  S/p DDAVP 0.3 g/kilogram IV 1. Also s/p 1 unit of platelets; now resolved.  Hypercarbic Respiratory Failure secondary to  CHF, pleural effusions, LLL PNA History of CAD status post multiple stents & CABG HTN CHF Unclear BUN Elevation Anemia  Thrombocytopenia - resolved Elevated PTT Hx CVA - prior to admit baseline bed bound since 2009, in diapers but normal in conversation  82 year old female who is s/p long/complicated course with respiratory failure secondary to congestive heart failure, pleural effusions, and left lower lobe pneumonia. Patient was extubated and subsequently reintubated with hemoptysis requiring bronchoscopy for blood clots and plugging of endotracheal tube. The patient subsequently suffered a left-sided pneumothorax requiring chest tube placement. Patient has had significant protein calorie malnutrition as well as thrombocytopenia that had previously resolved. Patient has been noted to have an elevated PTT with a pending mixing study. Also note she is uremic and this is thought to be due to a multitude of factors including diuresis as well as high-protein nutrition. Appears to be comfortable on PSV.   Plan Cont weaning effort.  Doubt will be a candidate for decannulation Aim for negative volume status  Wean steroids to off   Simonne MartinetPeter E Babcock ACNP-BC [facility] Pulmonary/Critical Care Pager # [phone] OR # [phone] if no answer  11/28/2015, 10:31 AM

## 2015-11-28 NOTE — Consult Note (Addendum)
[facility] CONSULT NOTE    Date: 11/28/2015                  Patient Name:  CORNELIA [name]  MRN: [id number]  DOB: 14-Mar-1931  Age / Sex: 82 y.o., female         PCP: Monique Rhymes, MD                 Service Requesting Consult: Dr. Nancee Liter                 Reason for Consult: Acute renal failure            History of Present Illness: Patient is a 82 y.o. female with a PMHx of diabetes mellitus type 2, hypertension, history of CVA, coronary artery disease, chronic kidney disease stage III who was admitted to [facility] on 11/25/2015 for on going treatment of respiratory failure.  She was recently admitted to [facility] from 08/06/2016 to 11/25/2015.  Her primary issue was respiratory failure secondary to CHF, pleural effusions, and left lower lobe pneumonia. She has also been having hemoptysis. She is status post tracheostomy placement.  pprior to being at [facility] she was at [facility] and found to have a urinary tract infection and discharged on Levaquin. However she declined and subsequently came back to [facility] and found to have respiratory failure. She has low ejection fraction of 35%. She was found to have quite elevated BUN out of proportion to creatinine. Nephrology was consulted for this. It was felt that this was multifactorial with contribution from tube feeds, catabolic state, digestion of blood products.  ANCA antibodies were found to be negative however. She remains on the ventilator at this point in time.  BUN is currently 154 with a creatinine 1.36.   Medications: Outpatient medications: Prescriptions prior to admission  Medication Sig Dispense Refill Last Dose  . albuterol (PROVENTIL) (2.5 MG/3ML) 0.083% nebulizer solution Take 3 mLs (2.5 mg total) by nebulization every 6 (six) hours. 75 mL 12   . carvedilol (COREG) 3.125 MG tablet Take 1 tablet (3.125 mg total) by mouth 2 (two) times daily with  a meal.     . chlorhexidine gluconate (PERIDEX) 0.12 % solution 15 mLs by Mouth Rinse route 2 (two) times daily. 120 mL 0   . fentaNYL (SUBLIMAZE) 100 MCG/2ML injection Inject 1-4 mLs (50-200 mcg total) into the vein every 2 (two) hours as needed (breakthrough pain). 2 mL 0   . folic acid (FOLVITE) 1 MG tablet TAKE ONE TABLET BY MOUTH ONCE DAILY 30 tablet 11 11/07/2015 at Unknown time  . hydrALAZINE (APRESOLINE) 20 MG/ML injection Inject 0.5 mLs (10 mg total) into the vein every 4 (four) hours as needed (FOR SYSTOLIC BLOOD PRESSURE MORE THAN 160). 1 mL    . hydrALAZINE (APRESOLINE) 25 MG tablet Take 1 tablet (25 mg total) by mouth every 8 (eight) hours.     . insulin aspart (NOVOLOG) 100 UNIT/ML injection Correction coverage: Sensitive (thin, NPO, renal) CBG < 70: implement hypoglycemia protocol CBG 70 - 120: 0 units CBG 121 - 150: 1 unit CBG 151 - 200: 2 units CBG 201 - 250: 3 units CBG 251 - 300: 5 units CBG 301 - 350: 7 units CBG 351 - 400: 9 units CBG > 400: call MD and obtain STAT lab verification 10 mL 11   . isosorbide mononitrate (ISMO,MONOKET) 10 MG tablet Take 1.5 tablets (15 mg total) by mouth  2 (two) times daily at 10 AM and 5 PM.     . methylPREDNISolone sodium succinate (SOLU-MEDROL) 40 mg/mL injection Inject 1 mL (40 mg total) into the vein daily. 1 each 0   . midazolam (VERSED) 2 MG/2ML SOLN injection Inject 1-4 mLs (1-4 mg total) into the vein every 2 (two) hours as needed for agitation or sedation. 1 mL 0   . Nutritional Supplements (FEEDING SUPPLEMENT, NEPRO CARB STEADY,) LIQD Place 1,000 mLs into feeding tube daily.  0   . nystatin (MYCOSTATIN) 100000 UNIT/ML suspension Take 5 mLs (500,000 Units total) by mouth 4 (four) times daily. 60 mL 0   . pantoprazole (PROTONIX) 40 MG injection Inject 40 mg into the vein daily. 1 each    . polysaccharide iron (NIFEREX) 150 MG CAPS capsule Take 150 mg by mouth 2 (two) times daily.    11/07/2015 at Unknown time  . primidone (MYSOLINE) 50  MG tablet Take 1 tablet (50 mg total) by mouth at bedtime.     . sodium chloride flush (NS) 0.9 % SOLN 10-40 mLs by Intracatheter route every 12 (twelve) hours.     . sodium chloride flush (NS) 0.9 % SOLN 10-40 mLs by Intracatheter route as needed (flush).       Current medications: Current Facility-Administered Medications  Medication Dose Route Frequency Provider Last Rate Last Dose  . 0.9 %  sodium chloride infusion   Intravenous Once Carron Curie, MD          Allergies: Allergies  Allergen Reactions  . Penicillins Anaphylaxis, Itching and Swelling    Has patient had a PCN reaction causing immediate rash, facial/tongue/throat swelling, SOB or lightheadedness with hypotension: Yes Has patient had a PCN reaction causing severe rash involving mucus membranes or skin necrosis: No  Has patient had a PCN reaction that required hospitalization: Unknown Has patient had a PCN reaction occurring within the last 10 years: No  If all of the above answers are "NO", then may proceed with Cephalosporin use.       Past Medical History: Past Medical History  Diagnosis Date  . Diabetes mellitus   . Hypertension   . Stroke [facility]) Right Side Weakness  . CHF (congestive heart failure) (HCC)     2D ECHO, 09/13/2011 - EF 40-45%, normal  . 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 Pain in limb     BILATERAL EXTREMITY VENOUS DUPLEX, 01/08/2011 - no evidence of deep vein or superficial thrombosis or Baker's cyst     Past Surgical History: Past Surgical History  Procedure Laterality Date  . Cardiac surgery    . Cardiac catheterization  10/27/2004    3 stents placed, 3.5x28 proximally,3.5x33 mid segment, and 3.5x33 in the region of the crux, stenosis being reduced to 0% at proximal and mid segment and being reduced from 80% ot 10% in the stent at the  cruxed segment  . Cardiac catheterization  09/26/2004    High grade 95% ostial stenosis in the RCA with 70% mid stenosis, 95% distal stenosis and total occlusion of the PDA with collaterals  . Coronary artery bypass graft  02/24/1999    x4; IMA to distal LAD, left radial to first circ, SVG to first diagonal, SVG to posterior descending coronary artery  . Tracheostomy tube placement N/A 11/24/2015    Procedure: TRACHEOSTOMY;  Surgeon: Osborn Coho, MD;  Location: [facility] OR;  Service: ENT;  Laterality: N/A;     Family History: Family History  Problem Relation Age of Onset  . Cancer Mother     Lung  . Heart disease Brother   . Hypertension Brother   . Heart disease Brother   . Hypertension Brother      Social History: Social History   Social History  . Marital Status: Married    Spouse Name: N/A  . Number of Children: N/A  . Years of Education: N/A   Occupational History  . Not on file.   Social History Main Topics  . Smoking status: Never Smoker   . Smokeless tobacco: Never Used  . Alcohol Use: No  . Drug Use: No  . Sexual Activity: No   Other Topics Concern  . Not on file   Social History Narrative     Review of Systems: Unable to provide as patient has tracheostomy in place  Vital Signs: There were no vitals taken for this visit.  Weight trends: There were no vitals filed for this visit.  Physical Exam: General: Critically ill appearing  Head: Normocephalic, atraumatic.  Eyes: Anicteric, spontaneous EOM noted  Nose: Mucous membranes moist, not inflammed, nonerythematous.  Throat: Oropharynx nonerythematous, no exudate appreciated.   Neck: Tracheostomy in place  Lungs:  Vent assisted, bilateral rhonchi  Heart: S1S2 no rubs  Abdomen:  BS normoactive. Soft, Nondistended, non-tender.  No masses or organomegaly.  Extremities: 1+ b/l LE edema  Neurologic: Arousable, will follow simple commands  Skin: No visible rashes, scars.    Lab results: Basic Metabolic  Panel:  Recent Labs Lab 11/22/15 0400  11/26/15 0520 11/27/15 0624 11/28/15 0550  NA 143  < > 139 141 146*  K 3.7  < > 3.7 3.9 3.8  CL 112*  < > 106 109 111  CO2 22  < > 18* 20* 22  GLUCOSE 184*  < > 160* 167* 150*  BUN 103*  < > 178* 159* 154*  CREATININE 1.00  < > 1.42* 1.41* 1.36*  CALCIUM 8.4*  < > 8.3* 8.4* 8.6*  MG 2.2  --   --  2.3 2.6*  PHOS 3.6  --   --  5.5* 4.5  < > = values in this interval not displayed.  Liver Function Tests:  Recent Labs Lab 11/22/15 0400 11/23/15 0450 11/26/15 0520  AST 61* 115* 126*  ALT 33 58* 61*  ALKPHOS 91 106 95  BILITOT 0.6 0.4 0.9  PROT 5.5* 5.3* 5.0*  ALBUMIN 2.2* 2.0* 2.1*   No results for input(s): LIPASE, AMYLASE in the last 168 hours. No results for input(s): AMMONIA in the last 168 hours.  CBC:  Recent Labs Lab 11/22/15 0400 11/24/15 0437 11/26/15 0520 11/26/15 2123 11/27/15 0624 11/28/15 0550  WBC 7.5 5.3 29.8*  --  13.6* 12.6*  NEUTROABS 6.7 4.6  --   --   --   --   HGB 8.5* 7.9* 6.4* 8.4* 8.5* 8.3*  HCT 28.9* 25.8* 20.1* 26.6* 26.4* 26.2*  MCV 91.5 88.4 88.5  --  83.0 84.2  PLT 163 147* 203  --  144* 142*    Cardiac Enzymes: No results for input(s): CKTOTAL, CKMB, CKMBINDEX, TROPONINI in the last 168 hours.  BNP: Invalid input(s): POCBNP  CBG:  Recent Labs Lab 11/24/15 2032 11/25/15 0032 11/25/15 0429 11/25/15 0825 11/25/15 1251  GLUCAP 144* 157* 161* 131* 151*    Microbiology: Results for orders placed or performed during the hospital encounter  of 11/07/15  Urine culture     Status: None   Collection Time: 11/07/15  7:49 PM  Result Value Ref Range Status   Specimen Description URINE, CATHETERIZED  Final   Special Requests NONE  Final   Culture NO GROWTH 2 DAYS  Final   Report Status 11/09/2015 FINAL  Final  MRSA PCR Screening     Status: None   Collection Time: 11/08/15  1:06 AM  Result Value Ref Range Status   MRSA by PCR NEGATIVE NEGATIVE Final    Comment:        The GeneXpert  MRSA Assay (FDA approved for NASAL specimens only), is one component of a comprehensive MRSA colonization surveillance program. It is not intended to diagnose MRSA infection nor to guide or monitor treatment for MRSA infections.   Culture, blood (routine x 2)     Status: None   Collection Time: 11/08/15 12:57 PM  Result Value Ref Range Status   Specimen Description BLOOD RIGHT ARM  Final   Special Requests BOTTLES DRAWN AEROBIC AND ANAEROBIC 10 CC  Final   Culture NO GROWTH 5 DAYS  Final   Report Status 11/13/2015 FINAL  Final  Culture, blood (routine x 2)     Status: None   Collection Time: 11/08/15  1:20 PM  Result Value Ref Range Status   Specimen Description BLOOD LEFT HAND  Final   Special Requests IN PEDIATRIC BOTTLE 2 CC  Final   Culture NO GROWTH 5 DAYS  Final   Report Status 11/13/2015 FINAL  Final  Culture, respiratory (NON-Expectorated)     Status: None   Collection Time: 11/17/15 11:35 AM  Result Value Ref Range Status   Specimen Description TRACHEAL ASPIRATE  Final   Special Requests Normal  Final   Gram Stain   Final    MODERATE WBC PRESENT,BOTH PMN AND MONONUCLEAR RARE SQUAMOUS EPITHELIAL CELLS PRESENT NO ORGANISMS SEEN Performed at [facility]    Culture   Final    NORMAL OROPHARYNGEAL FLORA Performed at [facility]    Report Status 11/20/2015 FINAL  Final  Urine culture     Status: None   Collection Time: 11/18/15  7:01 PM  Result Value Ref Range Status   Specimen Description URINE, CATHETERIZED  Final   Special Requests NONE  Final   Culture >=100,000 COLONIES/mL YEAST  Final   Report Status 11/20/2015 FINAL  Final  C difficile quick scan w PCR reflex     Status: None   Collection Time: 11/21/15  3:12 PM  Result Value Ref Range Status   C Diff antigen NEGATIVE NEGATIVE Final   C Diff toxin NEGATIVE NEGATIVE Final   C Diff interpretation Negative for toxigenic C. difficile  Final  Surgical pcr screen     Status: None    Collection Time: 11/24/15  2:46 AM  Result Value Ref Range Status   MRSA, PCR NEGATIVE NEGATIVE Final   Staphylococcus aureus NEGATIVE NEGATIVE Final    Comment:        The Xpert SA Assay (FDA approved for NASAL specimens in patients over 31 years of age), is one component of a comprehensive surveillance program.  Test performance has been validated by [facility] for patients greater than or equal to 42 year old. It is not intended to diagnose infection nor to guide or monitor treatment.     Coagulation Studies:  Recent Labs  11/26/15 0635 11/27/15 0624 11/28/15 0550  LABPROT 15.8* 24.4* 14.6  INR 1.25  2.22* 1.12    Urinalysis: No results for input(s): COLORURINE, LABSPEC, PHURINE, GLUCOSEU, HGBUR, BILIRUBINUR, KETONESUR, PROTEINUR, UROBILINOGEN, NITRITE, LEUKOCYTESUR in the last 72 hours.  Invalid input(s): APPERANCEUR    Imaging:  No results found.   Assessment & Plan: Pt is a 82 y.o. female with a PMHx of diabetes mellitus type 2, hypertension, history of CVA, coronary artery disease, chronic kidney disease stage III who was admitted to [facility] on 11/25/2015 for on going treatment of respiratory failure.  She was recently admitted to [facility] from 08/06/2016 to 11/25/2015.  Her primary issue was respiratory failure secondary to CHF, pleural effusions, and left lower lobe pneumonia. She has also been having hemoptysis. She is status post tracheostomy placement.  1.  Acute renal failure with profound azotemia due to diuresis, tube feeds, digested blood products, catabolic state. 2.  CKD stage III 3.  Anemia of CKD.  4.  Acute respiratory failure. 5.  Protein calorie malnutrition.  Plan:   The patient has had complex recent history. Her biggest issue recently has been acute respiratory failure along with hemoptysis. She has had some testing for pulmonary renal syndrome which was negative. However she continues to have significant azotemia. She  has very little muscle mass therefore is unlikely did generate a very high creatinine. If azotemia continues to worsen we may need to consider a trial of dialysis however we will hold off for now. We recommend continued monitoring of renal function as well as urine output as well as CBC. Ventilatory support per pulmonary critical care.

## 2015-11-29 LAB — BASIC METABOLIC PANEL
Anion gap: 11 (ref 5–15)
BUN: 128 mg/dL — ABNORMAL HIGH (ref 6–20)
CALCIUM: 8.5 mg/dL — AB (ref 8.9–10.3)
CHLORIDE: 113 mmol/L — AB (ref 101–111)
CO2: 22 mmol/L (ref 22–32)
CREATININE: 1.21 mg/dL — AB (ref 0.44–1.00)
GFR calc Af Amer: 47 mL/min — ABNORMAL LOW (ref >60)
GFR calc non Af Amer: 41 mL/min — ABNORMAL LOW (ref >60)
Glucose, Bld: 161 mg/dL — ABNORMAL HIGH (ref 65–99)
Potassium: 3.7 mmol/L (ref 3.5–5.1)
Sodium: 146 mmol/L — ABNORMAL HIGH (ref 135–145)

## 2015-11-29 LAB — CBC
HEMATOCRIT: 24.4 % — AB (ref 36.0–46.0)
Hemoglobin: 7.9 g/dL — ABNORMAL LOW (ref 12.0–15.0)
MCH: 27.8 pg (ref 26.0–34.0)
MCHC: 32.4 g/dL (ref 30.0–36.0)
MCV: 85.9 fL (ref 78.0–100.0)
Platelets: 124 10*3/uL — ABNORMAL LOW (ref 150–400)
RBC: 2.84 MIL/uL — ABNORMAL LOW (ref 3.87–5.11)
RDW: 20 % — AB (ref 11.5–15.5)
WBC: 11.1 10*3/uL — ABNORMAL HIGH (ref 4.0–10.5)

## 2015-11-30 NOTE — Progress Notes (Signed)
[facility]  ROUNDING NOTE   Subjective:  BUN down to 128 and creatinine down to 1.21. Overall patient appears to be doing better than before. Daughter at bedside.   Objective:  Vital signs in last 24 hours:   temperature 96.8 pulse 91 respirations 21 blood pressure 163/64  Physical Exam: General: Resting in bed, NAD  Head: Normocephalic, atraumatic. Moist oral mucosal membranes  Eyes: Anicteric  Neck: Tracheostomy in place  Lungs:  Scattered rhonchi, normal effort  Heart: S1S2 no rubs  Abdomen:  Soft, nontender, BS present   Extremities: trace peripheral edema.  Neurologic: Lethargic but arousable, will follow simple commands  Skin: No lesions       Basic Metabolic Panel:  Recent Labs Lab 11/25/15 0447 11/26/15 0520 11/27/15 0624 11/28/15 0550 11/29/15 0605  NA 138 139 141 146* 146*  K 3.9 3.7 3.9 3.8 3.7  CL 105 106 109 111 113*  CO2 20* 18* 20* 22 22  GLUCOSE 167* 160* 167* 150* 161*  BUN 156* 178* 159* 154* 128*  CREATININE 1.11* 1.42* 1.41* 1.36* 1.21*  CALCIUM 8.3* 8.3* 8.4* 8.6* 8.5*  MG  --   --  2.3 2.6*  --   PHOS  --   --  5.5* 4.5  --     Liver Function Tests:  Recent Labs Lab 11/26/15 0520  AST 126*  ALT 61*  ALKPHOS 95  BILITOT 0.9  PROT 5.0*  ALBUMIN 2.1*   No results for input(s): LIPASE, AMYLASE in the last 168 hours. No results for input(s): AMMONIA in the last 168 hours.  CBC:  Recent Labs Lab 11/24/15 0437 11/26/15 0520 11/26/15 2123 11/27/15 0624 11/28/15 0550 11/29/15 0605  WBC 5.3 29.8*  --  13.6* 12.6* 11.1*  NEUTROABS 4.6  --   --   --   --   --   HGB 7.9* 6.4* 8.4* 8.5* 8.3* 7.9*  HCT 25.8* 20.1* 26.6* 26.4* 26.2* 24.4*  MCV 88.4 88.5  --  83.0 84.2 85.9  PLT 147* 203  --  144* 142* 124*    Cardiac Enzymes: No results for input(s): CKTOTAL, CKMB, CKMBINDEX, TROPONINI in the last 168 hours.  BNP: Invalid input(s): POCBNP  CBG:  Recent Labs Lab 11/24/15 2032 11/25/15 0032 11/25/15 0429  11/25/15 0825 11/25/15 1251  GLUCAP 144* 157* 161* 131* 151*    Microbiology: Results for orders placed or performed during the hospital encounter of 11/07/15  Urine culture     Status: None   Collection Time: 11/07/15  7:49 PM  Result Value Ref Range Status   Specimen Description URINE, CATHETERIZED  Final   Special Requests NONE  Final   Culture NO GROWTH 2 DAYS  Final   Report Status 11/09/2015 FINAL  Final  MRSA PCR Screening     Status: None   Collection Time: 11/08/15  1:06 AM  Result Value Ref Range Status   MRSA by PCR NEGATIVE NEGATIVE Final    Comment:        The GeneXpert MRSA Assay (FDA approved for NASAL specimens only), is one component of a comprehensive MRSA colonization surveillance program. It is not intended to diagnose MRSA infection nor to guide or monitor treatment for MRSA infections.   Culture, blood (routine x 2)     Status: None   Collection Time: 11/08/15 12:57 PM  Result Value Ref Range Status   Specimen Description BLOOD RIGHT ARM  Final   Special Requests BOTTLES DRAWN AEROBIC AND ANAEROBIC 10 CC  Final  Culture NO GROWTH 5 DAYS  Final   Report Status 11/13/2015 FINAL  Final  Culture, blood (routine x 2)     Status: None   Collection Time: 11/08/15  1:20 PM  Result Value Ref Range Status   Specimen Description BLOOD LEFT HAND  Final   Special Requests IN PEDIATRIC BOTTLE 2 CC  Final   Culture NO GROWTH 5 DAYS  Final   Report Status 11/13/2015 FINAL  Final  Culture, respiratory (NON-Expectorated)     Status: None   Collection Time: 11/17/15 11:35 AM  Result Value Ref Range Status   Specimen Description TRACHEAL ASPIRATE  Final   Special Requests Normal  Final   Gram Stain   Final    MODERATE WBC PRESENT,BOTH PMN AND MONONUCLEAR RARE SQUAMOUS EPITHELIAL CELLS PRESENT NO ORGANISMS SEEN Performed at [facility]    Culture   Final    NORMAL OROPHARYNGEAL FLORA Performed at [facility]    Report Status 11/20/2015  FINAL  Final  Urine culture     Status: None   Collection Time: 11/18/15  7:01 PM  Result Value Ref Range Status   Specimen Description URINE, CATHETERIZED  Final   Special Requests NONE  Final   Culture >=100,000 COLONIES/mL YEAST  Final   Report Status 11/20/2015 FINAL  Final  C difficile quick scan w PCR reflex     Status: None   Collection Time: 11/21/15  3:12 PM  Result Value Ref Range Status   C Diff antigen NEGATIVE NEGATIVE Final   C Diff toxin NEGATIVE NEGATIVE Final   C Diff interpretation Negative for toxigenic C. difficile  Final  Surgical pcr screen     Status: None   Collection Time: 11/24/15  2:46 AM  Result Value Ref Range Status   MRSA, PCR NEGATIVE NEGATIVE Final   Staphylococcus aureus NEGATIVE NEGATIVE Final    Comment:        The Xpert SA Assay (FDA approved for NASAL specimens in patients over 35 years of age), is one component of a comprehensive surveillance program.  Test performance has been validated by [facility] for patients greater than or equal to 64 year old. It is not intended to diagnose infection nor to guide or monitor treatment.     Coagulation Studies:  Recent Labs  11/28/15 0550  LABPROT 14.6  INR 1.12    Urinalysis: No results for input(s): COLORURINE, LABSPEC, PHURINE, GLUCOSEU, HGBUR, BILIRUBINUR, KETONESUR, PROTEINUR, UROBILINOGEN, NITRITE, LEUKOCYTESUR in the last 72 hours.  Invalid input(s): APPERANCEUR    Imaging: Dg Abd Portable 1v  11/28/2015  CLINICAL DATA:  Generalized abdominal pain for 2 days. EXAM: PORTABLE ABDOMEN - 1 VIEW COMPARISON:  Radiographs 11/25/2015 and 11/17/2015.  CT 10/13/2015. FINDINGS: 1913 hours. Mild breathing artifact noted. Tube tip overlies the epigastric area. Previously noted bowel distention has improved. The bowel gas pattern remains nonobstructive. There are extensive vascular calcifications. Degenerative changes are present throughout the lumbar spine. IMPRESSION: Persistent nonobstructive  bowel gas pattern with decreasing bowel distension. Nasogastric tube tip projects over the proximal stomach. Electronically Signed   By: Carey Bullocks M.D.   On: 11/28/2015 19:54     Medications:     . sodium chloride   Intravenous Once     Assessment/ Plan:  82 y.o. female with a PMHx of diabetes mellitus type 2, hypertension, history of CVA, coronary artery disease, chronic kidney disease stage III who was admitted to [facility] on 11/25/2015 for on going treatment of respiratory  failure. She was [facility]/2015 to 11/25/2015. Her primary issue was respiratory failure secondary to CHF, pleural effusions, and left lower lobe pneumonia. She has also been having hemoptysis. She is status post tracheostomy placement.  1. Acute renal failure with profound azotemia due to diuresis, tube feeds, digested blood products, catabolic state.  Testing for pulmonary renal syndrome negative 2. CKD stage III 3. Anemia of CKD.  4. Acute respiratory failure. 5. Protein calorie malnutrition.  6.  Hypertension.  Plan:  Renal function has improved at the moment. BUN currently 128 creatinine down to 1.2. Therefore no urgent indication for dialysis at the moment. Continue to hold diuretics at this point in time.  Amlodipine 5 mg daily to her medication regimen. Continue to monitor her progress closely.  LOS:  [name] 3/8/20174:20 PM

## 2015-12-01 ENCOUNTER — Other Ambulatory Visit (HOSPITAL_COMMUNITY): Payer: Self-pay

## 2015-12-01 NOTE — Progress Notes (Signed)
PULMONARY / CRITICAL CARE MEDICINE   Name: [name] MRN: [id number] DOB: 25-Jun-1931    ADMISSION DATE:  11/25/2015 CONSULTATION DATE:  11/25/2015  REFERRING MD:  [facility]  CHIEF COMPLAINT:  Bleeding Around Tracheostomy  SUBJECTIVE:  No distress  VITAL SIGNS: 97.8 hr110 rr 20 141/76 sats 94%  PHYSICAL EXAMINATION: General:  No distress. Family at bedside. Sedated Neuro:  Very sleepy after sedation HEENT:  Tracheostomy in place.. Dressing removed. No scleral icterus. Moist mucus membranes. Cardiovascular:  Regular rhythm.  Lungs:  On ventilator, no distress. Good Vts on vent. Scattered rhonchi. Abdomen:  Protuberant. Musculoskeletal:  No joint effusion or deformity appreciated. Skin:  Warm & dry. No rash on exposed skin.  LABS:  BMET  Recent Labs Lab 11/27/15 0624 11/28/15 0550 11/29/15 0605  NA 141 146* 146*  K 3.9 3.8 3.7  CL 109 111 113*  CO2 20* 22 22  BUN 159* 154* 128*  CREATININE 1.41* 1.36* 1.21*  GLUCOSE 167* 150* 161*    CBC  Recent Labs Lab 11/27/15 0624 11/28/15 0550 11/29/15 0605  WBC 13.6* 12.6* 11.1*  HGB 8.5* 8.3* 7.9*  HCT 26.4* 26.2* 24.4*  PLT 144* 142* 124*   Glucose  Recent Labs Lab 11/24/15 1611 11/24/15 2032 11/25/15 0032 11/25/15 0429 11/25/15 0825 11/25/15 1251  GLUCAP 127* 144* 157* 161* 131* 151*    Imaging Dg Abd Portable 1v  12/01/2015  CLINICAL DATA:  Constipation, history of previous CVA, diabetes, CHF. EXAM: PORTABLE ABDOMEN - 1 VIEW COMPARISON:  Supine abdominal film of November 28, 2015 FINDINGS: There remains a moderate colonic stool burden. There is gas within both small and large bowel loops as well as within the stomach. The pattern does not appear obstructive. The rectum is not included in the field of view. There are degenerative changes of the lower lumbar spine. IMPRESSION: Mildly increased colonic stool burden.  No evidence of obstruction. Electronically Signed   By: David  [place] M.D.    On: 12/01/2015 07:42    SIGNIFICANT EVENTS: 3/2 - Tracheostomy placement by ENT 3/3 - Discharge to LTAC  LINES/TUBES: Shiley Trach 8.0 3/2 (by ENT in OR)>>> L IJ CVL 2/14>>> NGT R Nare 3/2>>>  ASSESSMENT / PLAN:    Tracheostomy dependence  Bleeding around tracheostomy in setting of uremia:  S/p DDAVP 0.3 g/kilogram IV 1. Also s/p 1 unit of platelets; now resolved.  Hypercarbic Respiratory Failure secondary to CHF, pleural effusions, LLL PNA History of CAD status post multiple stents & CABG HTN CHF Unclear BUN Elevation Anemia  Thrombocytopenia - resolved Elevated PTT Hx CVA - prior to admit baseline bed bound since 2009, in diapers but normal in conversation  82 year old female who is s/p long/complicated course with respiratory failure secondary to congestive heart failure, pleural effusions, and left lower lobe pneumonia. Patient was extubated and subsequently reintubated with hemoptysis requiring bronchoscopy for blood clots and plugging of endotracheal tube. The patient subsequently suffered a left-sided pneumothorax requiring chest tube placement. Patient has had significant protein calorie malnutrition as well as thrombocytopenia that had previously resolved. Patient has been noted to have an elevated PTT with a pending mixing study. Also note she is uremic and this is thought to be due to a multitude of factors including diuresis as well as high-protein nutrition. Appears to be comfortable on PSV.   Plan Cont weaning effort.  Doubt will be a candidate for decannulation ever. Aim for negative volume status  Wean steroids to off   Brett Canales  Murry Khiev ACNP [facility] Pager [phone] till 3 pm If no answer page [phone] 12/01/2015, 9:52 AM

## 2015-12-02 ENCOUNTER — Other Ambulatory Visit (HOSPITAL_COMMUNITY): Payer: Self-pay

## 2015-12-02 DIAGNOSIS — J9621 Acute and chronic respiratory failure with hypoxia: Secondary | ICD-10-CM

## 2015-12-02 LAB — CBC
HEMATOCRIT: 25.2 % — AB (ref 36.0–46.0)
HEMOGLOBIN: 7.6 g/dL — AB (ref 12.0–15.0)
MCH: 27 pg (ref 26.0–34.0)
MCHC: 30.2 g/dL (ref 30.0–36.0)
MCV: 89.4 fL (ref 78.0–100.0)
Platelets: 108 10*3/uL — ABNORMAL LOW (ref 150–400)
RBC: 2.82 MIL/uL — ABNORMAL LOW (ref 3.87–5.11)
RDW: 20.7 % — ABNORMAL HIGH (ref 11.5–15.5)
WBC: 10.2 10*3/uL (ref 4.0–10.5)

## 2015-12-02 LAB — BASIC METABOLIC PANEL
Anion gap: 8 (ref 5–15)
BUN: 88 mg/dL — AB (ref 6–20)
CHLORIDE: 113 mmol/L — AB (ref 101–111)
CO2: 26 mmol/L (ref 22–32)
CREATININE: 1.02 mg/dL — AB (ref 0.44–1.00)
Calcium: 8.4 mg/dL — ABNORMAL LOW (ref 8.9–10.3)
GFR calc Af Amer: 58 mL/min — ABNORMAL LOW (ref >60)
GFR calc non Af Amer: 50 mL/min — ABNORMAL LOW (ref >60)
Glucose, Bld: 177 mg/dL — ABNORMAL HIGH (ref 65–99)
Potassium: 3.3 mmol/L — ABNORMAL LOW (ref 3.5–5.1)
Sodium: 147 mmol/L — ABNORMAL HIGH (ref 135–145)

## 2015-12-02 NOTE — Progress Notes (Signed)
[facility]  ROUNDING NOTE   Subjective:  BUN now down to 88 with a creatinine of 1.02. She remains on the ventilator. Serum sodium high at 147 and potassium slightly low at 3.3.   Objective:  Vital signs in last 24 hours:   temperature 95.5 pulse 84 respirations 18 blood pressure 182/52  Physical Exam: General: Resting in bed, NAD  Head: Normocephalic, atraumatic. Moist oral mucosal membranes  Eyes: Anicteric  Neck: Tracheostomy in place  Lungs:  Scattered rhonchi, normal effort  Heart: S1S2 no rubs  Abdomen:  Soft, nontender, BS present   Extremities: trace peripheral edema.  Neurologic: awake, will follow simple commands  Skin: No lesions       Basic Metabolic Panel:  Recent Labs Lab 11/26/15 0520 11/27/15 0624 11/28/15 0550 11/29/15 0605 12/02/15 0640  NA 139 141 146* 146* 147*  K 3.7 3.9 3.8 3.7 3.3*  CL 106 109 111 113* 113*  CO2 18* 20* GLUCOSE 160* 167* 150* 161* 177*  BUN 178* 159* 154* 128* 88*  CREATININE 1.42* 1.41* 1.36* 1.21* 1.02*  CALCIUM 8.3* 8.4* 8.6* 8.5* 8.4*  MG  --  2.3 2.6*  --   --   PHOS  --  5.5* 4.5  --   --     Liver Function Tests:  Recent Labs Lab 11/26/15 0520  AST 126*  ALT 61*  ALKPHOS 95  BILITOT 0.9  PROT 5.0*  ALBUMIN 2.1*   No results for input(s): LIPASE, AMYLASE in the last 168 hours. No results for input(s): AMMONIA in the last 168 hours.  CBC:  Recent Labs Lab 11/26/15 0520 11/26/15 2123 11/27/15 0624 11/28/15 0550 11/29/15 0605 12/02/15 0640  WBC 29.8*  --  13.6* 12.6* 11.1* 10.2  HGB 6.4* 8.4* 8.5* 8.3* 7.9* 7.6*  HCT 20.1* 26.6* 26.4* 26.2* 24.4* 25.2*  MCV 88.5  --  83.0 84.2 85.9 89.4  PLT 203  --  144* 142* 124* PENDING    Cardiac Enzymes: No results for input(s): CKTOTAL, CKMB, CKMBINDEX, TROPONINI in the last 168 hours.  BNP: Invalid input(s): POCBNP  CBG:  Recent Labs Lab 11/25/15 1251  GLUCAP 151*    Microbiology: Results for orders placed or  performed during the hospital encounter of 11/07/15  Urine culture     Status: None   Collection Time: 11/07/15  7:49 PM  Result Value Ref Range Status   Specimen Description URINE, CATHETERIZED  Final   Special Requests NONE  Final   Culture NO GROWTH 2 DAYS  Final   Report Status 11/09/2015 FINAL  Final  MRSA PCR Screening     Status: None   Collection Time: 11/08/15  1:06 AM  Result Value Ref Range Status   MRSA by PCR NEGATIVE NEGATIVE Final    Comment:        The GeneXpert MRSA Assay (FDA approved for NASAL specimens only), is one component of a comprehensive MRSA colonization surveillance program. It is not intended to diagnose MRSA infection nor to guide or monitor treatment for MRSA infections.   Culture, blood (routine x 2)     Status: None   Collection Time: 11/08/15 12:57 PM  Result Value Ref Range Status   Specimen Description BLOOD RIGHT ARM  Final   Special Requests BOTTLES DRAWN AEROBIC AND ANAEROBIC 10 CC  Final   Culture NO GROWTH 5 DAYS  Final   Report Status 11/13/2015 FINAL  Final  Culture, blood (routine x 2)  Status: None   Collection Time: 11/08/15  1:20 PM  Result Value Ref Range Status   Specimen Description BLOOD LEFT HAND  Final   Special Requests IN PEDIATRIC BOTTLE 2 CC  Final   Culture NO GROWTH 5 DAYS  Final   Report Status 11/13/2015 FINAL  Final  Culture, respiratory (NON-Expectorated)     Status: None   Collection Time: 11/17/15 11:35 AM  Result Value Ref Range Status   Specimen Description TRACHEAL ASPIRATE  Final   Special Requests Normal  Final   Gram Stain   Final    MODERATE WBC PRESENT,BOTH PMN AND MONONUCLEAR RARE SQUAMOUS EPITHELIAL CELLS PRESENT NO ORGANISMS SEEN Performed at [facility]    Culture   Final    NORMAL OROPHARYNGEAL FLORA Performed at [facility]    Report Status 11/20/2015 FINAL  Final  Urine culture     Status: None   Collection Time: 11/18/15  7:01 PM  Result Value Ref Range Status    Specimen Description URINE, CATHETERIZED  Final   Special Requests NONE  Final   Culture >=100,000 COLONIES/mL YEAST  Final   Report Status 11/20/2015 FINAL  Final  C difficile quick scan w PCR reflex     Status: None   Collection Time: 11/21/15  3:12 PM  Result Value Ref Range Status   C Diff antigen NEGATIVE NEGATIVE Final   C Diff toxin NEGATIVE NEGATIVE Final   C Diff interpretation Negative for toxigenic C. difficile  Final  Surgical pcr screen     Status: None   Collection Time: 11/24/15  2:46 AM  Result Value Ref Range Status   MRSA, PCR NEGATIVE NEGATIVE Final   Staphylococcus aureus NEGATIVE NEGATIVE Final    Comment:        The Xpert SA Assay (FDA approved for NASAL specimens in patients over 80 years of age), is one component of a comprehensive surveillance program.  Test performance has been validated by [facility] for patients greater than or equal to 23102 year old. It is not intended to diagnose infection nor to guide or monitor treatment.     Coagulation Studies: No results for input(s): LABPROT, INR in the last 72 hours.  Urinalysis: No results for input(s): COLORURINE, LABSPEC, PHURINE, GLUCOSEU, HGBUR, BILIRUBINUR, KETONESUR, PROTEINUR, UROBILINOGEN, NITRITE, LEUKOCYTESUR in the last 72 hours.  Invalid input(s): APPERANCEUR    Imaging: Dg Chest Port 1 View  12/02/2015  CLINICAL DATA:  Respiratory failure. EXAM: PORTABLE CHEST 1 VIEW COMPARISON:  11/26/2015. FINDINGS: Tracheostomy tube, NG tube, left IJ line stable position. Prior CABG. Cardiomegaly. Mild bilateral from interstitial prominence and pleural effusions noted, slightly increased from prior exam. Findings suggest mild congestive heart failure. Low lung volumes with basilar atelectasis. No pneumothorax. IMPRESSION: 1. Lines and tubes in stable position. 2. Prior CABG. Cardiomegaly with mild pulmonary vascular prominence and interstitial prominence with small pleural effusions. Findings suggesting  mild congestive heart failure. Low lung volumes with basilar atelectasis. Electronically Signed   By: Maisie Fushomas  Register   On: 12/02/2015 07:32   Dg Abd Portable 1v  12/01/2015  CLINICAL DATA:  Constipation, history of previous CVA, diabetes, CHF. EXAM: PORTABLE ABDOMEN - 1 VIEW COMPARISON:  Supine abdominal film of November 28, 2015 FINDINGS: There remains a moderate colonic stool burden. There is gas within both small and large bowel loops as well as within the stomach. The pattern does not appear obstructive. The rectum is not included in the field of view. There are degenerative changes  of the lower lumbar spine. IMPRESSION: Mildly increased colonic stool burden.  No evidence of obstruction. Electronically Signed   By: David  [place] M.D.   On: 12/01/2015 07:42     Medications:     . sodium chloride   Intravenous Once     Assessment/ Plan:  82 y.o. female with a PMHx of diabetes mellitus type 2, hypertension, history of CVA, coronary artery disease, chronic kidney disease stage III who was admitted to [facility] on 11/25/2015 for on going treatment of respiratory failure. She was recently admitted to [facility] from 11/07/2015 to 11/25/2015. Her primary issue was respiratory failure secondary to CHF, pleural effusions, and left lower lobe pneumonia. She has also been having hemoptysis. She is status post tracheostomy placement.  1. Acute renal failure with profound azotemia due to diuresis, tube feeds, digested blood products, catabolic state.  Testing for pulmonary renal syndrome negative 2. CKD stage III 3. Anemia of CKD.  4. Acute respiratory failure. 5. Protein calorie malnutrition.  6.  Hypertension.  Plan:  BUN and creatinine both are improving. Serum sodium noted to be high however. Start D5W at 40 cc per hour for the next 48 hours. Continue to monitor her renal function parameters. It appears that she will not be a candidate for decannulation per pulmonary critical  care. Hypertension control improved overall with good blood pressure trend however blood pressure was a bit high this a.m. Continue current antihypertensive therapy.  LOS:  Case Vassell 3/10/20178:56 AM

## 2015-12-03 LAB — BASIC METABOLIC PANEL
Anion gap: 7 (ref 5–15)
BUN: 91 mg/dL — AB (ref 6–20)
CHLORIDE: 110 mmol/L (ref 101–111)
CO2: 26 mmol/L (ref 22–32)
CREATININE: 1.14 mg/dL — AB (ref 0.44–1.00)
Calcium: 8.1 mg/dL — ABNORMAL LOW (ref 8.9–10.3)
GFR calc Af Amer: 50 mL/min — ABNORMAL LOW (ref >60)
GFR calc non Af Amer: 44 mL/min — ABNORMAL LOW (ref >60)
GLUCOSE: 197 mg/dL — AB (ref 65–99)
Potassium: 4.1 mmol/L (ref 3.5–5.1)
Sodium: 143 mmol/L (ref 135–145)

## 2015-12-05 LAB — BLOOD GAS, ARTERIAL
Acid-Base Excess: 0.1 mmol/L (ref 0.0–2.0)
BICARBONATE: 24.1 meq/L — AB (ref 20.0–24.0)
FIO2: 0.28
PATIENT TEMPERATURE: 97.3
PEEP: 5 cmH2O
PO2 ART: 318 mmHg — AB (ref 80.0–100.0)
RATE: 16 resp/min
TCO2: 25.2 mmol/L (ref 0–100)
pCO2 arterial: 36.6 mmHg (ref 35.0–45.0)
pH, Arterial: 7.43 (ref 7.350–7.450)

## 2015-12-05 LAB — BASIC METABOLIC PANEL
Anion gap: 9 (ref 5–15)
BUN: 97 mg/dL — AB (ref 6–20)
CHLORIDE: 101 mmol/L (ref 101–111)
CO2: 24 mmol/L (ref 22–32)
Calcium: 7.8 mg/dL — ABNORMAL LOW (ref 8.9–10.3)
Creatinine, Ser: 1.12 mg/dL — ABNORMAL HIGH (ref 0.44–1.00)
GFR calc Af Amer: 52 mL/min — ABNORMAL LOW (ref >60)
GFR, EST NON AFRICAN AMERICAN: 44 mL/min — AB (ref >60)
GLUCOSE: 156 mg/dL — AB (ref 65–99)
POTASSIUM: 4.1 mmol/L (ref 3.5–5.1)
Sodium: 134 mmol/L — ABNORMAL LOW (ref 135–145)

## 2015-12-05 LAB — PREPARE RBC (CROSSMATCH)

## 2015-12-05 LAB — CBC
HCT: 21 % — ABNORMAL LOW (ref 36.0–46.0)
Hemoglobin: 6.4 g/dL — CL (ref 12.0–15.0)
MCH: 26.4 pg (ref 26.0–34.0)
MCHC: 30 g/dL (ref 30.0–36.0)
MCV: 87.9 fL (ref 78.0–100.0)
PLATELETS: 84 10*3/uL — AB (ref 150–400)
RBC: 2.39 MIL/uL — ABNORMAL LOW (ref 3.87–5.11)
RDW: 21.1 % — ABNORMAL HIGH (ref 11.5–15.5)
WBC: 9.3 10*3/uL (ref 4.0–10.5)

## 2015-12-05 LAB — HEMOGLOBIN AND HEMATOCRIT, BLOOD
HEMATOCRIT: 21.1 % — AB (ref 36.0–46.0)
HEMOGLOBIN: 6.3 g/dL — AB (ref 12.0–15.0)

## 2015-12-05 NOTE — H&P (Signed)
Chief Complaint: Patient was seen in consultation today for gastrostomy tube placement at the request of Dr. Carron CurieAli Hijazi  Referring Physician(s): Dr. Carron CurieAli Hijazi  Supervising Physician: Gilmer MorWagner, Jaime  History of Present Illness: [name] is a 82 y.o. female with respiratory failure. [facility] for a prolonged period of time due to respiratory failure, ultimately having a tracheostomy placed. She has now been [facility] for [facility] management. She has been receiving TF via NGT without issue. She developed some bleeding around her trach site recently, but this is resolved. She has some anemia and received 1 unit PRBCs today. Hx of thrombocytopenia, was stable but now trending down past week. Not making much progress towards decannulation. IR is asked to eval for G-tube placement. Chart, PMHx, meds, albs, imagine reviewed. Pt family at bedside today.  Past Medical History  Diagnosis Date  . Diabetes mellitus   . Hypertension   . Stroke [facility](HCC) Right Side Weakness  . CHF (congestive heart failure) (HCC)     2D ECHO, 09/13/2011 - EF 40-45%, normal  . 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 Pain in limb     BILATERAL EXTREMITY VENOUS DUPLEX, 01/08/2011 - no evidence of deep vein or superficial thrombosis or Baker's cyst    Past Surgical History  Procedure Laterality Date  . Cardiac surgery    . Cardiac catheterization  10/27/2004    3 stents placed, 3.5x28 proximally,3.5x33 mid segment, and 3.5x33 in the region of the crux, stenosis being reduced to 0% at proximal and mid segment and being reduced from 80% ot 10% in the stent at the cruxed segment  . Cardiac catheterization  09/26/2004    High grade 95% ostial stenosis in the RCA with 70% mid stenosis, 95% distal stenosis and total occlusion  of the PDA with collaterals  . Coronary artery bypass graft  02/24/1999    x4; IMA to distal LAD, left radial to first circ, SVG to first diagonal, SVG to posterior descending coronary artery  . Tracheostomy tube placement N/A 11/24/2015    Procedure: TRACHEOSTOMY;  Surgeon: Osborn Cohoavid Shoemaker, MD;  [facility];  Service: ENT;  Laterality: N/A;    Allergies: Penicillins  Medications: Prior to Admission medications   Medication Sig Start Date End Date Taking? Authorizing Provider  albuterol (PROVENTIL) (2.5 MG/3ML) 0.083% nebulizer solution Take 3 mLs (2.5 mg total) by nebulization every 6 (six) hours. 11/25/15   Jeanella CrazeBrandi L Ollis, NP  carvedilol (COREG) 3.125 MG tablet Take 1 tablet (3.125 mg total) by mouth 2 (two) times daily with a meal. 11/25/15   Jeanella CrazeBrandi L Ollis, NP  chlorhexidine gluconate (PERIDEX) 0.12 % solution 15 mLs by Mouth Rinse route 2 (two) times daily. 11/25/15   Jeanella CrazeBrandi L Ollis, NP  fentaNYL (SUBLIMAZE) 100 MCG/2ML injection Inject 1-4 mLs (50-200 mcg total) into the vein every 2 (two) hours as needed (breakthrough pain). 11/25/15   Jeanella CrazeBrandi L Ollis, NP  folic acid (FOLVITE) 1 MG tablet TAKE ONE TABLET BY MOUTH ONCE DAILY 09/27/15   Lennette Biharihomas A Kelly, MD  hydrALAZINE (APRESOLINE) 20 MG/ML injection Inject 0.5 mLs (10 mg total) into the vein every 4 (four) hours as needed (FOR SYSTOLIC BLOOD PRESSURE MORE THAN 160). 11/25/15   Jeanella CrazeBrandi L Ollis, NP  hydrALAZINE (APRESOLINE) 25 MG tablet Take 1 tablet (25 mg  total) by mouth every 8 (eight) hours. 11/25/15   Jeanella Craze, NP  insulin aspart (NOVOLOG) 100 UNIT/ML injection Correction coverage: Sensitive (thin, NPO, renal) CBG < 70: implement hypoglycemia protocol CBG 70 - 120: 0 units CBG 121 - 150: 1 unit CBG 151 - 200: 2 units CBG 201 - 250: 3 units CBG 251 - 300: 5 units CBG 301 - 350: 7 units CBG 351 - 400: 9 units CBG > 400: call MD and obtain STAT lab verification 11/25/15   Jeanella Craze, NP  isosorbide mononitrate (ISMO,MONOKET) 10 MG tablet  Take 1.5 tablets (15 mg total) by mouth 2 (two) times daily at 10 AM and 5 PM. 11/25/15   Jeanella Craze, NP  methylPREDNISolone sodium succinate (SOLU-MEDROL) 40 mg/mL injection Inject 1 mL (40 mg total) into the vein daily. 11/26/15   Jeanella Craze, NP  midazolam (VERSED) 2 MG/2ML SOLN injection Inject 1-4 mLs (1-4 mg total) into the vein every 2 (two) hours as needed for agitation or sedation. 11/25/15   Jeanella Craze, NP  Nutritional Supplements (FEEDING SUPPLEMENT, NEPRO CARB STEADY,) LIQD Place 1,000 mLs into feeding tube daily. 11/25/15   Jeanella Craze, NP  nystatin (MYCOSTATIN) 100000 UNIT/ML suspension Take 5 mLs (500,000 Units total) by mouth 4 (four) times daily. 11/25/15   Jeanella Craze, NP  pantoprazole (PROTONIX) 40 MG injection Inject 40 mg into the vein daily. 11/25/15   Jeanella Craze, NP  polysaccharide iron (NIFEREX) 150 MG CAPS capsule Take 150 mg by mouth 2 (two) times daily.     Historical Provider, MD  primidone (MYSOLINE) 50 MG tablet Take 1 tablet (50 mg total) by mouth at bedtime. 11/25/15   Jeanella Craze, NP  sodium chloride flush (NS) 0.9 % SOLN 10-40 mLs by Intracatheter route every 12 (twelve) hours. 11/25/15   Jeanella Craze, NP  sodium chloride flush (NS) 0.9 % SOLN 10-40 mLs by Intracatheter route as needed (flush). 11/25/15   Jeanella Craze, NP     Family History  Problem Relation Age of Onset  . Cancer Mother     Lung  . Heart disease Brother   . Hypertension Brother   . Heart disease Brother   . Hypertension Brother     Social History   Social History  . Marital Status: Married    Spouse Name: N/A  . Number of Children: N/A  . Years of Education: N/A   Social History Main Topics  . Smoking status: Never Smoker   . Smokeless tobacco: Never Used  . Alcohol Use: No  . Drug Use: No  . Sexual Activity: No   Other Topics Concern  . Not on file   Social History Narrative     Review of Systems: A 12 point ROS discussed and pertinent positives are indicated  in the HPI above.  All other systems are negative.  Review of Systems  Vital Signs: Temp: 98.3, HR: 77, RR: 26, BP: 142/63  Physical Exam  Constitutional: She appears well-developed. No distress.  HENT:  Head: Normocephalic.  Neck: Normal range of motion. No JVD present.  Tracheostomy present  Cardiovascular: Normal rate, regular rhythm and normal heart sounds.   Pulmonary/Chest: Effort normal. No respiratory distress. She has no wheezes.  Abdominal: Soft. She exhibits no mass. There is no tenderness.  Diastasis noted, no surgical scars    Mallampati Score:  MD Evaluation Airway: Other (comments) Airway comments: trachesotomy Heart: WNL Abdomen: WNL Chest/ Lungs: WNL ASA  Classification: 3 Mallampati/Airway Score:  (tracheostomy)    Labs:  CBC:  Recent Labs  11/28/15 0550 11/29/15 0605 12/02/15 0640 12/05/15 0530 12/05/15 0653  WBC 12.6* 11.1* 10.2 9.3  --   HGB 8.3* 7.9* 7.6* 6.4* 6.3*  HCT 26.2* 24.4* 25.2* 21.0* 21.1*  PLT 142* 124* 108* 84*  --     COAGS:  Recent Labs  11/17/15 0035  11/23/15 1015 11/24/15 0730 11/25/15 1240 11/26/15 0635 11/27/15 0624 11/28/15 0550  INR 1.18  < > 1.15 1.23  --  1.25 2.22* 1.12  APTT 35  --  60* 59* 37.3*  --   --   --   < > = values in this interval not displayed.  BMP:  Recent Labs  11/29/15 0605 12/02/15 0640 12/03/15 0620 12/05/15 0530  NA 146* 147* 143 134*  K 3.7 3.3* 4.1 4.1  CL 113* 113* 110 101  CO2 GLUCOSE 161* 177* 197* 156*  BUN 128* 88* 91* 97*  CALCIUM 8.5* 8.4* 8.1* 7.8*  CREATININE 1.21* 1.02* 1.14* 1.12*  GFRNONAA 41* 50* 44* 44*  GFRAA 47* 58* 50* 52*    LIVER FUNCTION TESTS:  Recent Labs  11/19/15 0415 11/22/15 0400 11/23/15 0450 11/26/15 0520  BILITOT 0.7 0.6 0.4 0.9  AST 67* 61* 115* 126*  ALT 36 33 58* 61*  ALKPHOS 82 91 106 95  PROT 5.2* 5.5* 5.3* 5.0*  ALBUMIN 2.1* 2.2* 2.0* 2.1*    Assessment and Plan: Respiratory failure s/p  trach Dysphagia FTT/PCM Reviewed recent abd CT with Dr. Loreta Ave, appears to have acceptable percutaneous window for perc G-tube placement Receiving PRBC today. PLTs trending down Will check labs in am Stop TF at MN. Tentative plan for perc G-tube tomorrow. Risks and Benefits discussed with the patient's family including, but not limited to the need for a barium enema during the procedure, bleeding, infection, peritonitis, or damage to adjacent structures. All of the family's questions were answered. Consent signed and in chart.    Thank you for this interesting consult. .  A copy of this report was sent to the requesting provider on this date.  Electronically Signed: Brayton El 12/05/2015, 4:22 PM   I spent a total of 25 minutes in face to face in clinical consultation, greater than 50% of which was counseling/coordinating care for gastrostomy tube placement

## 2015-12-05 NOTE — Progress Notes (Signed)
[facility]  ROUNDING NOTE   Subjective:  BUN remains quite high and 97 however her creatinine currently 1.1. Hemoglobin also quite low at 6.3. She's also been evaluated per PEG tube at the moment.   Objective:  Vitals: Temperature 98.3 pulse 77 respirations 20 blood pressure 142/63  Physical Exam: General: Resting in bed, NAD  Head: Normocephalic, atraumatic. Moist oral mucosal membranes  Eyes: Anicteric  Neck: Tracheostomy in place  Lungs:  Scattered rhonchi, normal effort  Heart: S1S2 no rubs  Abdomen:  Soft, nontender, BS present   Extremities: trace peripheral edema.  Neurologic: awake, will follow simple commands  Skin: No lesions       Basic Metabolic Panel:  Recent Labs Lab 11/29/15 0605 12/02/15 0640 12/03/15 0620 12/05/15 0530  NA 146* 147* 143 134*  K 3.7 3.3* 4.1 4.1  CL 113* 113* 110 101  CO2 22 26 26 24   GLUCOSE 161* 177* 197* 156*  BUN 128* 88* 91* 97*  CREATININE 1.21* 1.02* 1.14* 1.12*  CALCIUM 8.5* 8.4* 8.1* 7.8*    Liver Function Tests: No results for input(s): AST, ALT, ALKPHOS, BILITOT, PROT, ALBUMIN in the last 168 hours. No results for input(s): LIPASE, AMYLASE in the last 168 hours. No results for input(s): AMMONIA in the last 168 hours.  CBC:  Recent Labs Lab 11/29/15 0605 12/02/15 0640 12/05/15 0530 12/05/15 0653  WBC 11.1* 10.2 9.3  --   HGB 7.9* 7.6* 6.4* 6.3*  HCT 24.4* 25.2* 21.0* 21.1*  MCV 85.9 89.4 87.9  --   PLT 124* 108* 84*  --     Cardiac Enzymes: No results for input(s): CKTOTAL, CKMB, CKMBINDEX, TROPONINI in the last 168 hours.  BNP: Invalid input(s): POCBNP  CBG: No results for input(s): GLUCAP in the last 168 hours.  Microbiology: Results for orders placed or performed during the hospital encounter of 11/07/15  Urine culture     Status: None   Collection Time: 11/07/15  7:49 PM  Result Value Ref Range Status   Specimen Description URINE, CATHETERIZED  Final   Special Requests NONE   Final   Culture NO GROWTH 2 DAYS  Final   Report Status 11/09/2015 FINAL  Final  MRSA PCR Screening     Status: None   Collection Time: 11/08/15  1:06 AM  Result Value Ref Range Status   MRSA by PCR NEGATIVE NEGATIVE Final    Comment:        The GeneXpert MRSA Assay (FDA approved for NASAL specimens only), is one component of a comprehensive MRSA colonization surveillance program. It is not intended to diagnose MRSA infection nor to guide or monitor treatment for MRSA infections.   Culture, blood (routine x 2)     Status: None   Collection Time: 11/08/15 12:57 PM  Result Value Ref Range Status   Specimen Description BLOOD RIGHT ARM  Final   Special Requests BOTTLES DRAWN AEROBIC AND ANAEROBIC 10 CC  Final   Culture NO GROWTH 5 DAYS  Final   Report Status 11/13/2015 FINAL  Final  Culture, blood (routine x 2)     Status: None   Collection Time: 11/08/15  1:20 PM  Result Value Ref Range Status   Specimen Description BLOOD LEFT HAND  Final   Special Requests IN PEDIATRIC BOTTLE 2 CC  Final   Culture NO GROWTH 5 DAYS  Final   Report Status 11/13/2015 FINAL  Final  Culture, respiratory (NON-Expectorated)     Status: None   Collection Time: 11/17/15  11:35 AM  Result Value Ref Range Status   Specimen Description TRACHEAL ASPIRATE  Final   Special Requests Normal  Final   Gram Stain   Final    MODERATE WBC PRESENT,BOTH PMN AND MONONUCLEAR RARE SQUAMOUS EPITHELIAL CELLS PRESENT NO ORGANISMS SEEN Performed at [facility]    Culture   Final    NORMAL OROPHARYNGEAL FLORA Performed at [facility]    Report Status 11/20/2015 FINAL  Final  Urine culture     Status: None   Collection Time: 11/18/15  7:01 PM  Result Value Ref Range Status   Specimen Description URINE, CATHETERIZED  Final   Special Requests NONE  Final   Culture >=100,000 COLONIES/mL YEAST  Final   Report Status 11/20/2015 FINAL  Final  C difficile quick scan w PCR reflex     Status: None    Collection Time: 11/21/15  3:12 PM  Result Value Ref Range Status   C Diff antigen NEGATIVE NEGATIVE Final   C Diff toxin NEGATIVE NEGATIVE Final   C Diff interpretation Negative for toxigenic C. difficile  Final  Surgical pcr screen     Status: None   Collection Time: 11/24/15  2:46 AM  Result Value Ref Range Status   MRSA, PCR NEGATIVE NEGATIVE Final   Staphylococcus aureus NEGATIVE NEGATIVE Final    Comment:        The Xpert SA Assay (FDA approved for NASAL specimens in patients over 40 years of age), is one component of a comprehensive surveillance program.  Test performance has been validated by [facility] for patients greater than or equal to 1 year old. It is not intended to diagnose infection nor to guide or monitor treatment.     Coagulation Studies: No results for input(s): LABPROT, INR in the last 72 hours.  Urinalysis: No results for input(s): COLORURINE, LABSPEC, PHURINE, GLUCOSEU, HGBUR, BILIRUBINUR, KETONESUR, PROTEINUR, UROBILINOGEN, NITRITE, LEUKOCYTESUR in the last 72 hours.  Invalid input(s): APPERANCEUR    Imaging: No results found.   Medications:     . sodium chloride   Intravenous Once     Assessment/ Plan:  82 y.o. female with a PMHx of diabetes mellitus type 2, hypertension, history of CVA, coronary artery disease, chronic kidney disease stage III who was admitted to [facility] on 11/25/2015 for on going treatment of respiratory failure. She was recently admitted to [name] ([place]) from 11/07/2015 to 11/25/2015. Her primary issue was respiratory failure secondary to CHF, pleural effusions, and left lower lobe pneumonia. She has also been having hemoptysis. She is status post tracheostomy placement.  1. Acute renal failure with profound azotemia due to diuresis, tube feeds, digested blood products, catabolic state.  Testing for pulmonary renal syndrome negative 2. CKD stage III 3. Anemia of CKD.  4. Acute respiratory  failure. 5. Protein calorie malnutrition.  6.  Hypertension.  Plan:  We continue to suspect that she may be digesting blood products. BUN remains quite high at 97 and hemoglobin has trended down to 6.3. Recommend blood transfusion but defer this to the hospitalist. Creatinine however appears to be stable at 1.1. No urgent indication for dialysis at this time. Continue ventilatory support as tolerated.  She is also being considered for a PEG tube at the moment.    LOS:  Anicka Stuckert 3/13/20174:35 PM

## 2015-12-05 NOTE — Progress Notes (Signed)
PULMONARY / CRITICAL CARE MEDICINE   Name: [name] MRN: [id number] DOB: [birth year]    ADMISSION DATE:  11/25/2015 CONSULTATION DATE:  11/25/2015  REFERRING MD:  [facility]  CHIEF COMPLAINT:  Bleeding Around Tracheostomy  SUBJECTIVE:  No distress  VITAL SIGNS: 97.8 hr110 rr 20 141/76 sats 94%  PHYSICAL EXAMINATION: General:  No distress. Family at bedside.  Neuro: does not speak english, Seems appropriate in interaction w/ her daughter.  HEENT:  Tracheostomy in place.. Dressing removed. No scleral icterus. Moist mucus membranes. Stoma is eroded at 6 o'clock  Cardiovascular:  Regular rhythm.  Lungs:  On ventilator, no distress. Good Vts on vent. Scattered rhonchi. Abdomen:  Protuberant. Musculoskeletal:  No joint effusion or deformity appreciated. Skin:  Warm & dry. No rash on exposed skin.  LABS:  BMET  Recent Labs Lab 12/02/15 0640 12/03/15 0620 12/05/15 0530  NA 147* 143 134*  K 3.3* 4.1 4.1  CL 113* 110 101  CO2 26 26 24   BUN 88* 91* 97*  CREATININE 1.02* 1.14* 1.12*  GLUCOSE 177* 197* 156*    CBC  Recent Labs Lab 11/29/15 0605 12/02/15 0640 12/05/15 0530 12/05/15 0653  WBC 11.1* 10.2 9.3  --   HGB 7.9* 7.6* 6.4* 6.3*  HCT 24.4* 25.2* 21.0* 21.1*  PLT 124* 108* 84*  --    Glucose No results for input(s): GLUCAP in the last 168 hours.  Imaging No results found.  SIGNIFICANT EVENTS: 3/2 - Tracheostomy placement by ENT 3/3 - Discharge to LTAC  LINES/TUBES: Shiley Trach 8.0 3/2 (by ENT in OR)>>> L IJ CVL 2/14>>> NGT R Nare 3/2>>>  ASSESSMENT / PLAN:    Tracheostomy dependence  Tracheal stoma erosion  Bleeding around tracheostomy in setting of uremia:  S/p DDAVP 0.3 g/kilogram IV 1. Also s/p 1 unit of platelets; now resolved.  Hypercarbic Respiratory Failure secondary to CHF, pleural effusions, LLL PNA History of CAD status post multiple stents & CABG AKI w/ uremia and hypernatremia-->improved as of  3/13 HTN CHF Unclear BUN Elevation Anemia -->recurrent w/out clear etiology Thrombocytopenia - resolved Elevated PTT Hx CVA - prior to admit baseline bed bound since 2009, in diapers but normal in conversation  82 year old female who is s/p long/complicated course with respiratory failure secondary to congestive heart failure, pleural effusions, and left lower lobe pneumonia. Patient was extubated and subsequently reintubated with hemoptysis requiring bronchoscopy for blood clots and plugging of endotracheal tube. The patient subsequently suffered a left-sided pneumothorax requiring chest tube placement. Patient has had significant protein calorie malnutrition as well as thrombocytopenia that had previously resolved. Also note she is uremic and this is thought to be due to a multitude of factors including diuresis as well as high-protein nutrition. Appears to be comfortable on PSV. But not making much in the way of progress and also has new recurrent anemia w/out clear source of bleeding.  Plan Cont weaning effort.  Doubt will be a candidate for decannulation ever. Aim for negative volume status  Wean steroids to off  Would consider anemia evaluation  Have requested wound nurse eval stoma erosion at trach site   Simonne MartinetPeter E [name] ACNP-BC [facility]monary/Critical Care Pager # [phone] OR # [phone] if no answer   Attending Note:  I have examined patient, reviewed labs, studies and notes. I have discussed the case with [name], and I agree with the data and plans as amended above. Continue efforts at weaning. MS and overall status (with chronic CHF) are barriers  to vent freedom. She has erosion at trach stoma site, will have wound care eval. Will follow up results PTT mixing study.   Monique Pupa, MD, PhD 12/05/2015, 11:49 AM [facility] [phone] or if no answer [phone]

## 2015-12-06 ENCOUNTER — Other Ambulatory Visit (HOSPITAL_COMMUNITY): Payer: Self-pay

## 2015-12-06 LAB — CBC
HCT: 24.6 % — ABNORMAL LOW (ref 36.0–46.0)
Hemoglobin: 7.8 g/dL — ABNORMAL LOW (ref 12.0–15.0)
MCH: 28.6 pg (ref 26.0–34.0)
MCHC: 31.7 g/dL (ref 30.0–36.0)
MCV: 90.1 fL (ref 78.0–100.0)
PLATELETS: 75 10*3/uL — AB (ref 150–400)
RBC: 2.73 MIL/uL — AB (ref 3.87–5.11)
RDW: 19.7 % — AB (ref 11.5–15.5)
WBC: 9.3 10*3/uL (ref 4.0–10.5)

## 2015-12-06 LAB — TYPE AND SCREEN
ABO/RH(D): O POS
ANTIBODY SCREEN: NEGATIVE
Donor AG Type: NEGATIVE
Unit division: 0

## 2015-12-06 LAB — PROTIME-INR
INR: 1.02 (ref 0.00–1.49)
PROTHROMBIN TIME: 13.6 s (ref 11.6–15.2)

## 2015-12-06 MED ORDER — LIDOCAINE HCL 1 % IJ SOLN
INTRAMUSCULAR | Status: AC
  Filled 2015-12-06: qty 20

## 2015-12-06 MED ORDER — MIDAZOLAM HCL 2 MG/2ML IJ SOLN
INTRAMUSCULAR | Status: AC
  Filled 2015-12-06: qty 2

## 2015-12-06 MED ORDER — MIDAZOLAM HCL 2 MG/2ML IJ SOLN
INTRAMUSCULAR | Status: AC | PRN
Start: 2015-12-06 — End: 2015-12-06
  Administered 2015-12-06 (×2): 1 mg via INTRAVENOUS

## 2015-12-06 MED ORDER — IOHEXOL 300 MG/ML  SOLN
50.0000 mL | Freq: Once | INTRAMUSCULAR | Status: AC | PRN
  Administered 2015-12-06: 20 mL via INTRAVENOUS

## 2015-12-06 MED ORDER — FENTANYL CITRATE (PF) 100 MCG/2ML IJ SOLN
INTRAMUSCULAR | Status: AC
  Filled 2015-12-06: qty 2

## 2015-12-06 MED ORDER — FENTANYL CITRATE (PF) 100 MCG/2ML IJ SOLN
INTRAMUSCULAR | Status: AC | PRN
  Administered 2015-12-06 (×2): 50 ug via INTRAVENOUS

## 2015-12-06 MED ORDER — CLINDAMYCIN PHOSPHATE 600 MG/50ML IV SOLN
600.0000 mg | Freq: Once | INTRAVENOUS | Status: AC
  Administered 2015-12-06: 600 mg via INTRAVENOUS
  Filled 2015-12-06: qty 50

## 2015-12-06 NOTE — Sedation Documentation (Signed)
Patient is resting comfortably. 

## 2015-12-06 NOTE — Procedures (Signed)
Successful placement of 20 French gastrostomy tube.  No immediate complication.  Minimal blood loss. 

## 2015-12-07 LAB — CBC
HEMATOCRIT: 26.4 % — AB (ref 36.0–46.0)
HEMOGLOBIN: 8.4 g/dL — AB (ref 12.0–15.0)
MCH: 29.3 pg (ref 26.0–34.0)
MCHC: 31.8 g/dL (ref 30.0–36.0)
MCV: 92 fL (ref 78.0–100.0)
Platelets: 71 10*3/uL — ABNORMAL LOW (ref 150–400)
RBC: 2.87 MIL/uL — AB (ref 3.87–5.11)
RDW: 20.1 % — ABNORMAL HIGH (ref 11.5–15.5)
WBC: 6.6 10*3/uL (ref 4.0–10.5)

## 2015-12-07 LAB — BASIC METABOLIC PANEL
ANION GAP: 8 (ref 5–15)
BUN: 84 mg/dL — ABNORMAL HIGH (ref 6–20)
CHLORIDE: 104 mmol/L (ref 101–111)
CO2: 24 mmol/L (ref 22–32)
Calcium: 7.4 mg/dL — ABNORMAL LOW (ref 8.9–10.3)
Creatinine, Ser: 0.99 mg/dL (ref 0.44–1.00)
GFR calc non Af Amer: 52 mL/min — ABNORMAL LOW (ref >60)
GFR, EST AFRICAN AMERICAN: 60 mL/min — AB (ref >60)
Glucose, Bld: 138 mg/dL — ABNORMAL HIGH (ref 65–99)
Potassium: 3.9 mmol/L (ref 3.5–5.1)
Sodium: 136 mmol/L (ref 135–145)

## 2015-12-07 NOTE — Progress Notes (Signed)
[facility]  ROUNDING NOTE   Subjective:  PEG tube has been placed. BUN down to 84, creatinine down to 0.99. Currently off the ventilator.   Objective:  Vitals: Temperature 98.3 pulse 77 respirations 20 blood pressure 142/63  Physical Exam: General: Resting in bed, NAD  Head: Normocephalic, atraumatic. Moist oral mucosal membranes  Eyes: Anicteric  Neck: Tracheostomy in place  Lungs:  Scattered rhonchi, normal effort  Heart: S1S2 no rubs  Abdomen:  Soft, nontender, BS present   Extremities: trace peripheral edema.  Neurologic: awake, will follow simple commands  Skin: No lesions       Basic Metabolic Panel:  Recent Labs Lab 12/02/15 0640 12/03/15 0620 12/05/15 0530 12/07/15 0630  NA 147* 143 134* 136  K 3.3* 4.1 4.1 3.9  CL 113* 110 101 104  CO2 26 26 24 24   GLUCOSE 177* 197* 156* 138*  BUN 88* 91* 97* 84*  CREATININE 1.02* 1.14* 1.12* 0.99  CALCIUM 8.4* 8.1* 7.8* 7.4*    Liver Function Tests: No results for input(s): AST, ALT, ALKPHOS, BILITOT, PROT, ALBUMIN in the last 168 hours. No results for input(s): LIPASE, AMYLASE in the last 168 hours. No results for input(s): AMMONIA in the last 168 hours.  CBC:  Recent Labs Lab 12/02/15 0640 12/05/15 0530 12/05/15 0653 12/06/15 0625 12/07/15 0630  WBC 10.2 9.3  --  9.3 6.6  HGB 7.6* 6.4* 6.3* 7.8* 8.4*  HCT 25.2* 21.0* 21.1* 24.6* 26.4*  MCV 89.4 87.9  --  90.1 92.0  PLT 108* 84*  --  75* 71*    Cardiac Enzymes: No results for input(s): CKTOTAL, CKMB, CKMBINDEX, TROPONINI in the last 168 hours.  BNP: Invalid input(s): POCBNP  CBG: No results for input(s): GLUCAP in the last 168 hours.  Microbiology: Results for orders placed or performed during the hospital encounter of 11/07/15  Urine culture     Status: None   Collection Time: 11/07/15  7:49 PM  Result Value Ref Range Status   Specimen Description URINE, CATHETERIZED  Final   Special Requests NONE  Final   Culture NO GROWTH 2  DAYS  Final   Report Status 11/09/2015 FINAL  Final  MRSA PCR Screening     Status: None   Collection Time: 11/08/15  1:06 AM  Result Value Ref Range Status   MRSA by PCR NEGATIVE NEGATIVE Final    Comment:        The GeneXpert MRSA Assay (FDA approved for NASAL specimens only), is one component of a comprehensive MRSA colonization surveillance program. It is not intended to diagnose MRSA infection nor to guide or monitor treatment for MRSA infections.   Culture, blood (routine x 2)     Status: None   Collection Time: 11/08/15 12:57 PM  Result Value Ref Range Status   Specimen Description BLOOD RIGHT ARM  Final   Special Requests BOTTLES DRAWN AEROBIC AND ANAEROBIC 10 CC  Final   Culture NO GROWTH 5 DAYS  Final   Report Status 11/13/2015 FINAL  Final  Culture, blood (routine x 2)     Status: None   Collection Time: 11/08/15  1:20 PM  Result Value Ref Range Status   Specimen Description BLOOD LEFT HAND  Final   Special Requests IN PEDIATRIC BOTTLE 2 CC  Final   Culture NO GROWTH 5 DAYS  Final   Report Status 11/13/2015 FINAL  Final  Culture, respiratory (NON-Expectorated)     Status: None   Collection Time: 11/17/15 11:35 AM  Result Value Ref Range Status   Specimen Description TRACHEAL ASPIRATE  Final   Special Requests Normal  Final   Gram Stain   Final    MODERATE WBC PRESENT,BOTH PMN AND MONONUCLEAR RARE SQUAMOUS EPITHELIAL CELLS PRESENT NO ORGANISMS SEEN Performed at [facility]    Culture   Final    NORMAL OROPHARYNGEAL FLORA Performed at [facility]    Report Status 11/20/2015 FINAL  Final  Urine culture     Status: None   Collection Time: 11/18/15  7:01 PM  Result Value Ref Range Status   Specimen Description URINE, CATHETERIZED  Final   Special Requests NONE  Final   Culture >=100,000 COLONIES/mL YEAST  Final   Report Status 11/20/2015 FINAL  Final  C difficile quick scan w PCR reflex     Status: None   Collection Time: 11/21/15  3:12  PM  Result Value Ref Range Status   C Diff antigen NEGATIVE NEGATIVE Final   C Diff toxin NEGATIVE NEGATIVE Final   C Diff interpretation Negative for toxigenic C. difficile  Final  Surgical pcr screen     Status: None   Collection Time: 11/24/15  2:46 AM  Result Value Ref Range Status   MRSA, PCR NEGATIVE NEGATIVE Final   Staphylococcus aureus NEGATIVE NEGATIVE Final    Comment:        The Xpert SA Assay (FDA approved for NASAL specimens in patients over 60 years of age), is one component of a comprehensive surveillance program.  Test performance has been validated by [facility] for patients greater than or equal to 22 year old. It is not intended to diagnose infection nor to guide or monitor treatment.     Coagulation Studies:  Recent Labs  12/06/15 0625  LABPROT 13.6  INR 1.02    Urinalysis: No results for input(s): COLORURINE, LABSPEC, PHURINE, GLUCOSEU, HGBUR, BILIRUBINUR, KETONESUR, PROTEINUR, UROBILINOGEN, NITRITE, LEUKOCYTESUR in the last 72 hours.  Invalid input(s): APPERANCEUR    Imaging: Ir Gastrostomy Tube Mod Sed  12/06/2015  INDICATION: 82 year old with chronic respiratory failure and needs long-term tube feeds. EXAM: PERCUTANEOUS GASTROSTOMY TUBE WITH FLUOROSCOPIC GUIDANCE Physician: Rachelle Hora. Henn, MD MEDICATIONS: Clindamycin 600 mg; Antibiotics were administered within 1 hour of the procedure. ANESTHESIA/SEDATION: Versed 2.0 mg IV; Fentanyl 100 mcg IV Moderate Sedation Time:  25 minutes The patient was continuously monitored during the procedure by the interventional radiology nurse under my direct supervision. FLUOROSCOPY TIME:  Fluoroscopy Time: 4 minutes 36 seconds (31 mGy). COMPLICATIONS: None immediate. PROCEDURE: Informed consent was obtained for a percutaneous gastrostomy tube by the patient's daughter. The patient was placed on the interventional table. Fluoroscopy demonstrated gas in the transverse colon. An orogastric tube was placed with  fluoroscopic guidance. The anterior abdomen was prepped and draped in sterile fashion. Maximal barrier sterile technique was utilized including caps, mask, sterile gowns, sterile gloves, sterile drape, hand hygiene and skin antiseptic. Stomach was inflated with air through the orogastric tube. The skin and subcutaneous tissues were anesthetized with 1% lidocaine. A 17 gauge needle was directed into the distended stomach with fluoroscopic guidance. A wire was advanced into the stomach and a T-tact was deployed. A 9-French vascular sheath was placed and the orogastric tube was snared using a [employer] snare device. The orogastric tube and snare were pulled out of the patient's mouth. The snare device was connected to a 20-French gastrostomy tube. The snare device and gastrostomy tube were pulled through the patient's mouth and out the anterior abdominal  wall. The gastrostomy tube was cut to an appropriate length. Contrast injection through gastrostomy tube confirmed placement within the stomach. Fluoroscopic images were obtained for documentation. The gastrostomy tube was flushed with normal saline. IMPRESSION: Successful fluoroscopic guided percutaneous gastrostomy tube placement. Electronically Signed   By: Richarda Overlie M.D.   On: 12/06/2015 10:33     Medications:     . sodium chloride   Intravenous Once     Assessment/ Plan:  82 y.o. female with a PMHx of diabetes mellitus type 2, hypertension, history of CVA, coronary artery disease, chronic kidney disease stage III who was admitted to [facility] on 11/25/2015 for on going treatment of respiratory failure. She was recently admitted to [facility] from 11/07/2015 to 11/25/2015. Her primary issue was respiratory failure secondary to CHF, pleural effusions, and left lower lobe pneumonia. She has also been having hemoptysis. She is status post tracheostomy placement.  1. Acute renal failure with profound azotemia due to diuresis, tube feeds,  digested blood products, catabolic state.  Testing for pulmonary renal syndrome negative 2. CKD stage III 3. Anemia of CKD.  4. Acute respiratory failure. 5. Protein calorie malnutrition.  6.  Hypertension.  Plan:  Renal function appears to be improving. BUN down to 84 and creatinine is down to 0.99. PEG tube has been placed for nutritional support. Continue to monitor renal function parameters. Hemoglobin improved up to 8.4. May need to consider Procrit if hemoglobin begins to drop again. Patient currently off the ventilator and is doing well with tracheostomy and T piece.     LOS:  [name] 3/15/20174:42 PM

## 2015-12-08 ENCOUNTER — Other Ambulatory Visit (HOSPITAL_COMMUNITY): Payer: Self-pay

## 2015-12-11 ENCOUNTER — Other Ambulatory Visit (HOSPITAL_COMMUNITY): Payer: Self-pay

## 2015-12-11 LAB — BASIC METABOLIC PANEL
ANION GAP: 11 (ref 5–15)
BUN: 75 mg/dL — AB (ref 6–20)
CO2: 22 mmol/L (ref 22–32)
Calcium: 7.9 mg/dL — ABNORMAL LOW (ref 8.9–10.3)
Chloride: 101 mmol/L (ref 101–111)
Creatinine, Ser: 1.21 mg/dL — ABNORMAL HIGH (ref 0.44–1.00)
GFR calc Af Amer: 47 mL/min — ABNORMAL LOW (ref >60)
GFR calc non Af Amer: 41 mL/min — ABNORMAL LOW (ref >60)
GLUCOSE: 161 mg/dL — AB (ref 65–99)
POTASSIUM: 3.9 mmol/L (ref 3.5–5.1)
Sodium: 134 mmol/L — ABNORMAL LOW (ref 135–145)

## 2015-12-11 LAB — CBC
HEMATOCRIT: 27 % — AB (ref 36.0–46.0)
Hemoglobin: 8 g/dL — ABNORMAL LOW (ref 12.0–15.0)
MCH: 27.6 pg (ref 26.0–34.0)
MCHC: 29.6 g/dL — ABNORMAL LOW (ref 30.0–36.0)
MCV: 93.1 fL (ref 78.0–100.0)
Platelets: 132 10*3/uL — ABNORMAL LOW (ref 150–400)
RBC: 2.9 MIL/uL — AB (ref 3.87–5.11)
RDW: 19.6 % — AB (ref 11.5–15.5)
WBC: 6.3 10*3/uL (ref 4.0–10.5)

## 2015-12-11 LAB — MAGNESIUM: Magnesium: 2.3 mg/dL (ref 1.7–2.4)

## 2015-12-12 ENCOUNTER — Other Ambulatory Visit (HOSPITAL_COMMUNITY): Payer: Self-pay

## 2015-12-13 ENCOUNTER — Ambulatory Visit: Payer: Medicare Other | Admitting: Cardiovascular Disease

## 2015-12-13 DIAGNOSIS — J9620 Acute and chronic respiratory failure, unspecified whether with hypoxia or hypercapnia: Secondary | ICD-10-CM

## 2015-12-13 LAB — URINALYSIS, ROUTINE W REFLEX MICROSCOPIC
Bilirubin Urine: NEGATIVE
Glucose, UA: NEGATIVE mg/dL
HGB URINE DIPSTICK: NEGATIVE
Ketones, ur: NEGATIVE mg/dL
LEUKOCYTES UA: NEGATIVE
Nitrite: NEGATIVE
PROTEIN: NEGATIVE mg/dL
Specific Gravity, Urine: 1.015 (ref 1.005–1.030)
pH: 5.5 (ref 5.0–8.0)

## 2015-12-13 NOTE — Progress Notes (Signed)
PULMONARY / CRITICAL CARE MEDICINE   Name: [name] MRN: [id number] DOB: 08-24-1931    ADMISSION DATE:  11/25/2015 CONSULTATION DATE:  11/25/2015  REFERRING MD:  [facility]  CHIEF COMPLAINT:  Bleeding Around Tracheostomy   SUBJECTIVE:  No distress on vent  VITAL SIGNS: 99.2  hr80 rr 20 144/66 sats 98%  PHYSICAL EXAMINATION:  General:  No distress. Family at bedside.  Neuro: does not speak english, Seems appropriate in interaction w/ her daughter.  HEENT:  Tracheostomy in place.. Dressing removed. No scleral icterus. Moist mucus membranes. Stoma is eroded at 6 o'clock  Cardiovascular:  Regular rhythm.  Lungs:  On ventilator, no distress. Good Vts on vent. Diminished bases Abdomen: obese Musculoskeletal:  No joint effusion or deformity appreciated. Skin:  Warm & dry. No rash on exposed skin.  LABS:  BMET  Recent Labs Lab 12/07/15 0630 12/11/15 1130  NA 136 134*  K 3.9 3.9  CL 104 101  CO2 24 22  BUN 84* 75*  CREATININE 0.99 1.21*  GLUCOSE 138* 161*    CBC  Recent Labs Lab 12/07/15 0630 12/11/15 1130  WBC 6.6 6.3  HGB 8.4* 8.0*  HCT 26.4* 27.0*  PLT 71* 132*   Glucose No results for input(s): GLUCAP in the last 168 hours.  Imaging Dg Chest Port 1 View  12/12/2015  CLINICAL DATA:  Respiratory failure EXAM: PORTABLE CHEST 1 VIEW COMPARISON:  12/02/2015 FINDINGS: Tracheostomy in satisfactory position. Cardiomegaly with pulmonary vascular congestion. No frank interstitial edema. Mild patchy right perihilar/right lower and left basilar opacities, possibly reflecting atelectasis, pneumonia not excluded. No definite pleural effusion.  No pneumothorax. Postsurgical changes related to prior CABG. Left IJ venous catheter terminating at the cavoatrial junction. Median sternotomy. IMPRESSION: Cardiomegaly with pulmonary vascular congestion. No frank interstitial edema. Mild patchy right perihilar/lower lobe and left basilar opacities, possibly  atelectasis, pneumonia not excluded. Electronically Signed   By: Charline BillsSriyesh  Krishnan M.D.   On: 12/12/2015 16:29    SIGNIFICANT EVENTS: 3/2 - Tracheostomy placement by ENT 3/3 - Discharge to LTAC  LINES/TUBES: Shiley Trach 8.0 3/2 (by ENT in OR)>>> L IJ CVL 2/14>>> NGT R Nare 3/2>>>  ASSESSMENT / PLAN:    Tracheostomy dependence  Tracheal stoma erosion  Bleeding around tracheostomy in setting of uremia:  S/p DDAVP 0.3 g/kilogram IV 1. Also s/p 1 unit of platelets; now resolved.  Hypercarbic Respiratory Failure secondary to CHF, pleural effusions, LLL PNA History of CAD status post multiple stents & CABG AKI w/ uremia and hypernatremia-->improved as of 3/13 HTN CHF Unclear BUN Elevation Anemia -->recurrent w/out clear etiology Thrombocytopenia - resolved Elevated PTT Hx CVA - prior to admit baseline bed bound since 2009, in diapers but normal in conversation  82 year old female who is s/p long/complicated course with respiratory failure secondary to congestive heart failure, pleural effusions, and left lower lobe pneumonia. Patient was extubated and subsequently reintubated with hemoptysis requiring bronchoscopy for blood clots and plugging of endotracheal tube. The patient subsequently suffered a left-sided pneumothorax requiring chest tube placement. Patient has had significant protein calorie malnutrition as well as thrombocytopenia that had previously resolved.   CXR 3/20 Bibasilar opacification and interstitial edema > clinically does not have s/s PNA, doubt ATX however as on vent. Favor volume related. Possibly effusion , if so likely CHF related.  Plan Cont weaning effort.  Doubt will be a candidate for decannulation ever. Diurese as tolerated and re-assess CXR, suspect needs significant volume removal. If CXR, weaning does not improve  can evaluate with [place] for possible thoracentesis Would consider anemia evaluation  Have requested wound nurse eval stoma erosion at trach  site  PT as tolerated  Simonne Martinet ACNP-BC [facility] Pulmonary/Critical Care Pager # [phone] OR # [phone] if no answer

## 2015-12-14 LAB — BASIC METABOLIC PANEL
Anion gap: 11 (ref 5–15)
BUN: 75 mg/dL — AB (ref 6–20)
CALCIUM: 8.1 mg/dL — AB (ref 8.9–10.3)
CO2: 22 mmol/L (ref 22–32)
Chloride: 100 mmol/L — ABNORMAL LOW (ref 101–111)
Creatinine, Ser: 1.16 mg/dL — ABNORMAL HIGH (ref 0.44–1.00)
GFR calc Af Amer: 49 mL/min — ABNORMAL LOW (ref >60)
GFR, EST NON AFRICAN AMERICAN: 43 mL/min — AB (ref >60)
GLUCOSE: 143 mg/dL — AB (ref 65–99)
Potassium: 3.9 mmol/L (ref 3.5–5.1)
Sodium: 133 mmol/L — ABNORMAL LOW (ref 135–145)

## 2015-12-14 LAB — URINE CULTURE

## 2015-12-16 ENCOUNTER — Ambulatory Visit: Payer: Medicare Other | Admitting: Cardiology

## 2015-12-16 ENCOUNTER — Other Ambulatory Visit (HOSPITAL_COMMUNITY): Payer: Self-pay

## 2015-12-16 LAB — BASIC METABOLIC PANEL
Anion gap: 8 (ref 5–15)
BUN: 72 mg/dL — AB (ref 6–20)
CALCIUM: 7.7 mg/dL — AB (ref 8.9–10.3)
CO2: 22 mmol/L (ref 22–32)
CREATININE: 1.23 mg/dL — AB (ref 0.44–1.00)
Chloride: 100 mmol/L — ABNORMAL LOW (ref 101–111)
GFR calc Af Amer: 46 mL/min — ABNORMAL LOW (ref >60)
GFR, EST NON AFRICAN AMERICAN: 40 mL/min — AB (ref >60)
GLUCOSE: 116 mg/dL — AB (ref 65–99)
Potassium: 3.8 mmol/L (ref 3.5–5.1)
Sodium: 130 mmol/L — ABNORMAL LOW (ref 135–145)

## 2015-12-16 LAB — CBC
HCT: 26.1 % — ABNORMAL LOW (ref 36.0–46.0)
Hemoglobin: 7.7 g/dL — ABNORMAL LOW (ref 12.0–15.0)
MCH: 27.4 pg (ref 26.0–34.0)
MCHC: 29.5 g/dL — AB (ref 30.0–36.0)
MCV: 92.9 fL (ref 78.0–100.0)
PLATELETS: 209 10*3/uL (ref 150–400)
RBC: 2.81 MIL/uL — ABNORMAL LOW (ref 3.87–5.11)
RDW: 19.5 % — AB (ref 11.5–15.5)
WBC: 6 10*3/uL (ref 4.0–10.5)

## 2015-12-17 ENCOUNTER — Other Ambulatory Visit (HOSPITAL_COMMUNITY): Payer: Self-pay

## 2015-12-17 LAB — BASIC METABOLIC PANEL
Anion gap: 7 (ref 5–15)
BUN: 70 mg/dL — AB (ref 6–20)
CALCIUM: 7.4 mg/dL — AB (ref 8.9–10.3)
CHLORIDE: 99 mmol/L — AB (ref 101–111)
CO2: 23 mmol/L (ref 22–32)
CREATININE: 1.11 mg/dL — AB (ref 0.44–1.00)
GFR calc Af Amer: 52 mL/min — ABNORMAL LOW (ref >60)
GFR calc non Af Amer: 45 mL/min — ABNORMAL LOW (ref >60)
Glucose, Bld: 117 mg/dL — ABNORMAL HIGH (ref 65–99)
Potassium: 3.7 mmol/L (ref 3.5–5.1)
SODIUM: 129 mmol/L — AB (ref 135–145)

## 2015-12-18 LAB — BASIC METABOLIC PANEL
ANION GAP: 8 (ref 5–15)
BUN: 74 mg/dL — ABNORMAL HIGH (ref 6–20)
CHLORIDE: 97 mmol/L — AB (ref 101–111)
CO2: 22 mmol/L (ref 22–32)
Calcium: 7.1 mg/dL — ABNORMAL LOW (ref 8.9–10.3)
Creatinine, Ser: 1.15 mg/dL — ABNORMAL HIGH (ref 0.44–1.00)
GFR calc non Af Amer: 43 mL/min — ABNORMAL LOW (ref >60)
GFR, EST AFRICAN AMERICAN: 50 mL/min — AB (ref >60)
Glucose, Bld: 216 mg/dL — ABNORMAL HIGH (ref 65–99)
Potassium: 4.7 mmol/L (ref 3.5–5.1)
SODIUM: 127 mmol/L — AB (ref 135–145)

## 2015-12-18 LAB — BRAIN NATRIURETIC PEPTIDE: B NATRIURETIC PEPTIDE 5: 2114.3 pg/mL — AB (ref 0.0–100.0)

## 2015-12-19 ENCOUNTER — Other Ambulatory Visit (HOSPITAL_COMMUNITY): Payer: Self-pay

## 2015-12-19 LAB — BASIC METABOLIC PANEL
ANION GAP: 8 (ref 5–15)
BUN: 80 mg/dL — AB (ref 6–20)
CALCIUM: 6.7 mg/dL — AB (ref 8.9–10.3)
CO2: 23 mmol/L (ref 22–32)
Chloride: 98 mmol/L — ABNORMAL LOW (ref 101–111)
Creatinine, Ser: 1.09 mg/dL — ABNORMAL HIGH (ref 0.44–1.00)
GFR calc Af Amer: 53 mL/min — ABNORMAL LOW (ref >60)
GFR, EST NON AFRICAN AMERICAN: 46 mL/min — AB (ref >60)
GLUCOSE: 244 mg/dL — AB (ref 65–99)
Potassium: 5.1 mmol/L (ref 3.5–5.1)
Sodium: 129 mmol/L — ABNORMAL LOW (ref 135–145)

## 2015-12-19 LAB — CBC WITH DIFFERENTIAL/PLATELET
BASOS ABS: 0 10*3/uL (ref 0.0–0.1)
Basophils Relative: 0 %
EOS PCT: 0 %
Eosinophils Absolute: 0 10*3/uL (ref 0.0–0.7)
HEMATOCRIT: 23 % — AB (ref 36.0–46.0)
Hemoglobin: 7 g/dL — ABNORMAL LOW (ref 12.0–15.0)
LYMPHS ABS: 0.9 10*3/uL (ref 0.7–4.0)
LYMPHS PCT: 30 %
MCH: 28.1 pg (ref 26.0–34.0)
MCHC: 30.4 g/dL (ref 30.0–36.0)
MCV: 92.4 fL (ref 78.0–100.0)
MONO ABS: 0.2 10*3/uL (ref 0.1–1.0)
MONOS PCT: 7 %
NEUTROS ABS: 2 10*3/uL (ref 1.7–7.7)
Neutrophils Relative %: 63 %
PLATELETS: 228 10*3/uL (ref 150–400)
RBC: 2.49 MIL/uL — ABNORMAL LOW (ref 3.87–5.11)
RDW: 19.3 % — AB (ref 11.5–15.5)
WBC: 3.1 10*3/uL — ABNORMAL LOW (ref 4.0–10.5)

## 2015-12-19 LAB — PREPARE RBC (CROSSMATCH)

## 2015-12-19 NOTE — Progress Notes (Signed)
PULMONARY / CRITICAL CARE MEDICINE   Name: [name] MRN: [id number] DOB: October 19, 1930    ADMISSION DATE:  11/25/2015 CONSULTATION DATE:  11/25/2015  REFERRING MD:  [facility]  CHIEF COMPLAINT:  Bleeding Around Tracheostomy   SUBJECTIVE:  No distress on vent  VITAL SIGNS: 98.9  hr98 rr 22 140/76 sats 98%  PHYSICAL EXAMINATION:  General:  No distress. Family at bedside.  Neuro: does not speak english, Seems appropriate in interaction w/ her daughter. Follows commands of family HEENT:  Tracheostomy in place.. Dressing removed. No scleral icterus. Moist mucus membranes. Stoma is eroded at 6 o'clock  Cardiovascular:  Regular rhythm.  Lungs:  On ventilator, no distress. Good Vts on vent. Diminished bases Abdomen: obese Musculoskeletal:  No joint effusion or deformity appreciated. Skin:  Warm & dry. No rash on exposed skin.  LABS:  BMET  Recent Labs Lab 12/17/15 0641 12/18/15 0744 12/19/15 0614  NA 129* 127* 129*  K 3.7 4.7 5.1  CL 99* 97* 98*  CO2 23 22 23   BUN 70* 74* 80*  CREATININE 1.11* 1.15* 1.09*  GLUCOSE 117* 216* 244*    CBC  Recent Labs Lab 12/16/15 0605 12/19/15 0614  WBC 6.0 3.1*  HGB 7.7* 7.0*  HCT 26.1* 23.0*  PLT 209 228   Glucose No results for input(s): GLUCAP in the last 168 hours.  Imaging Dg Chest Port 1 View  12/19/2015  CLINICAL DATA:  Respiratory failure, tracheostomy patient, pulmonary edema; history of CHF, CABG, diabetes, CVA. EXAM: PORTABLE CHEST 1 VIEW COMPARISON:  Portable chest x-ray of December 16, 2015 FINDINGS: The lungs are reasonably well inflated. There is persistent increased density at the lung bases greatest on the left. This is consistent with atelectasis and probable left-sided pleural effusion. The cardiac silhouette remains enlarged. There are chronic post CABG changes. The pulmonary vascularity remains engorged and the pulmonary interstitial markings remain increased. The tracheostomy appliance tip  projects at the level of the clavicular heads. The left internal jugular venous catheter tip projects over the midportion of the SVC. IMPRESSION: CHF with bilateral pulmonary interstitial edema. Bibasilar atelectasis and probable small left pleural effusion. The support tubes are in reasonable position. Electronically Signed   By: David  Swazil[place] M.D.   On: 12/19/2015 07:42    SIGNIFICANT EVENTS: 3/2 - Tracheostomy placement by ENT 3/3 - Discharge to LTAC  LINES/TUBES: Shiley Trach 8.0 3/2 (by ENT in OR)>>> L IJ CVL 2/14>>> NGT R Nare 3/2>>>  ASSESSMENT / PLAN:    Tracheostomy dependence  Tracheal stoma erosion  Bleeding around tracheostomy in setting of uremia:  S/p DDAVP 0.3 g/kilogram IV 1. Also s/p 1 unit of platelets; now resolved.  Hypercarbic Respiratory Failure secondary to CHF, pleural effusions, LLL PNA History of CAD status post multiple stents & CABG AKI w/ uremia and hypernatremia-->improved as of 3/13 HTN CHF Unclear BUN Elevation Anemia -->recurrent w/out clear etiology Thrombocytopenia - resolved Elevated PTT Hx CVA - prior to admit baseline bed bound since 2009, in diapers but normal in conversation  82 year old female who is s/p long/complicated course with respiratory failure secondary to congestive heart failure, pleural effusions, and left lower lobe pneumonia. Patient was extubated and subsequently reintubated with hemoptysis requiring bronchoscopy for blood clots and plugging of endotracheal tube. The patient subsequently suffered a left-sided pneumothorax requiring chest tube placement. Patient has had significant protein calorie malnutrition as well as thrombocytopenia that had previously resolved.   Plan Cont weaning effort.  Doubt will be a candidate  for decannulation ever. Diurese as tolerated and re-assess CXR, suspect needs significant volume removal. If CXR, weaning does not improve can evaluate with [place] for possible thoracentesis Would consider anemia  evaluation  PT as tolerated  Brett Canales Minor ACNP [facility] PCCM Pager [phone] till 3 pm If no answer page [phone] 12/19/2015, 10:18 AM

## 2015-12-20 LAB — URINALYSIS, ROUTINE W REFLEX MICROSCOPIC
Bilirubin Urine: NEGATIVE
Glucose, UA: NEGATIVE mg/dL
KETONES UR: NEGATIVE mg/dL
NITRITE: NEGATIVE
PROTEIN: 30 mg/dL — AB
Specific Gravity, Urine: 1.009 (ref 1.005–1.030)
pH: 5.5 (ref 5.0–8.0)

## 2015-12-20 LAB — TYPE AND SCREEN
ABO/RH(D): O POS
ANTIBODY SCREEN: NEGATIVE
Donor AG Type: NEGATIVE
UNIT DIVISION: 0

## 2015-12-20 LAB — URINE MICROSCOPIC-ADD ON

## 2015-12-20 LAB — HEMOGLOBIN AND HEMATOCRIT, BLOOD
HEMATOCRIT: 27.5 % — AB (ref 36.0–46.0)
HEMOGLOBIN: 8.3 g/dL — AB (ref 12.0–15.0)

## 2015-12-21 LAB — BASIC METABOLIC PANEL
ANION GAP: 9 (ref 5–15)
BUN: 78 mg/dL — ABNORMAL HIGH (ref 6–20)
CALCIUM: 7.7 mg/dL — AB (ref 8.9–10.3)
CO2: 25 mmol/L (ref 22–32)
CREATININE: 0.97 mg/dL (ref 0.44–1.00)
Chloride: 98 mmol/L — ABNORMAL LOW (ref 101–111)
GFR, EST NON AFRICAN AMERICAN: 53 mL/min — AB (ref >60)
Glucose, Bld: 184 mg/dL — ABNORMAL HIGH (ref 65–99)
Potassium: 5.2 mmol/L — ABNORMAL HIGH (ref 3.5–5.1)
SODIUM: 132 mmol/L — AB (ref 135–145)

## 2015-12-22 ENCOUNTER — Other Ambulatory Visit (HOSPITAL_COMMUNITY): Payer: Self-pay

## 2015-12-22 DIAGNOSIS — J810 Acute pulmonary edema: Secondary | ICD-10-CM

## 2015-12-22 DIAGNOSIS — Z9911 Dependence on respirator [ventilator] status: Secondary | ICD-10-CM

## 2015-12-22 LAB — BASIC METABOLIC PANEL WITH GFR
Anion gap: 11 (ref 5–15)
BUN: 81 mg/dL — ABNORMAL HIGH (ref 6–20)
CO2: 26 mmol/L (ref 22–32)
Calcium: 8.1 mg/dL — ABNORMAL LOW (ref 8.9–10.3)
Chloride: 99 mmol/L — ABNORMAL LOW (ref 101–111)
Creatinine, Ser: 0.99 mg/dL (ref 0.44–1.00)
GFR calc Af Amer: 60 mL/min — ABNORMAL LOW (ref >60)
GFR calc non Af Amer: 52 mL/min — ABNORMAL LOW (ref >60)
Glucose, Bld: 114 mg/dL — ABNORMAL HIGH (ref 65–99)
Potassium: 5 mmol/L (ref 3.5–5.1)
Sodium: 136 mmol/L (ref 135–145)

## 2015-12-22 NOTE — Progress Notes (Signed)
PULMONARY / CRITICAL CARE MEDICINE   Name: [name] MRN: [id number] DOB: 04/16/1931    ADMISSION DATE:  11/25/2015 CONSULTATION DATE:  11/25/2015  REFERRING MD:  [facility]  CHIEF COMPLAINT:  Bleeding Around Tracheostomy   SUBJECTIVE:  No distress on vent  VITAL SIGNS:  hr94rr 28 140/76 sats 93%  PHYSICAL EXAMINATION:  General:  No distress. Family at bedside. Sleeping Neuro: does not speak english, Seems appropriate in interaction w/ her daughter. Follows commands of family HEENT:  Tracheostomy in place.. Dressing removed. No scleral icterus. Moist mucus membranes. Stoma is eroded at 6 o'clock  Cardiovascular:  Regular rhythm.  Lungs:  On ventilator, no distress. Good Vts on vent. Diminished bases Abdomen: obese Musculoskeletal:  No joint effusion or deformity appreciated. Skin:  Warm & dry. No rash on exposed skin.  LABS:  BMET  Recent Labs Lab 12/19/15 0614 12/21/15 0648 12/22/15 0638  NA 129* 132* 136  K 5.1 5.2* 5.0  CL 98* 98* 99*  CO2 BUN 80* 78* 81*  CREATININE 1.09* 0.97 0.99  GLUCOSE 244* 184* 114*    CBC  Recent Labs Lab 12/16/15 0605 12/19/15 0614 12/20/15 0633  WBC 6.0 3.1*  --   HGB 7.7* 7.0* 8.3*  HCT 26.1* 23.0* 27.5*  PLT 209 228  --    Glucose No results for input(s): GLUCAP in the last 168 hours.  Imaging No results found.  SIGNIFICANT EVENTS: 3/2 - Tracheostomy placement by ENT 3/3 - Discharge to LTAC  LINES/TUBES: Shiley Trach 8.0 3/2 (by ENT in OR)>>> L IJ CVL 2/14>>> NGT R Nare 3/2>>>  ASSESSMENT / PLAN:    Tracheostomy dependence  Tracheal stoma erosion  Bleeding around tracheostomy in setting of uremia:  S/p DDAVP 0.3 g/kilogram IV 1. Also s/p 1 unit of platelets; now resolved.  Hypercarbic Respiratory Failure secondary to CHF, pleural effusions, LLL PNA History of CAD status post multiple stents & CABG AKI w/ uremia and hypernatremia-->improved as of 3/13 HTN CHF Unclear BUN  Elevation Anemia -->recurrent w/out clear etiology Thrombocytopenia - resolved Elevated PTT Hx CVA - prior to admit baseline bed bound since 2009, in diapers but normal in conversation  82 year old female who is s/p long/complicated course with respiratory failure secondary to congestive heart failure, pleural effusions, and left lower lobe pneumonia. Patient was extubated and subsequently reintubated with hemoptysis requiring bronchoscopy for blood clots and plugging of endotracheal tube. The patient subsequently suffered a left-sided pneumothorax requiring chest tube placement. Patient has had significant protein calorie malnutrition as well as thrombocytopenia that had previously resolved.   Plan Cont weaning effort.  Doubt will be a candidate for decannulation ever. Diurese as tolerated and re-assess CXR, suspect needs significant volume removal. If CXR, weaning does not improve Would consider anemia evaluation  PT as tolerated  Brett Canales Minor ACNP [facility] PCCM Pager [phone] till 3 pm If no answer page [phone] 12/22/2015, 9:02 AM  Attending Note:  82 year old female with morbid obesity and heart failure who was intubated and subsequently trached after a CHF exac and LLL PNA.  Patient evidently weans very well at night when she more awake.  Tolerated 8 hours TC overnight.  On exam, she is agitated and has some crackles.  I reviewed CXR myself, trach in good position.  Daughter updated at length bedside.  Discussed with SSH-MD and RT.  Respiratory failure:  - Keep dry.  - Wean as able.  - Will need home vent, arrangements  being made.  Hypoxemia with fluid overload.  - Diureses.  - Minimize intake as able.  Deconditioning:  - PT efforts.  - Mobilize.  - May even consider OOB to chair while on the vent.  Tracheostomy status:  - Maintain trach size and type.  - No decannulation.  Patient seen and examined, agree with above note.  I dictated the care and orders written for  this patient under my direction.  Alyson ReedyWesam G Yacoub, MD [phone]

## 2015-12-23 LAB — URINE CULTURE

## 2015-12-26 DIAGNOSIS — J9611 Chronic respiratory failure with hypoxia: Secondary | ICD-10-CM

## 2015-12-26 NOTE — Progress Notes (Signed)
PULMONARY / CRITICAL CARE MEDICINE   Name: [name] MRN: [id number] DOB: 11-23-1930    ADMISSION DATE:  11/25/2015 CONSULTATION DATE:  11/25/2015  REFERRING MD:  [facility]  CHIEF COMPLAINT:  Bleeding Around Tracheostomy   SUBJECTIVE:  No distress on vent  VITAL SIGNS:  hr100rr 32 160/90 sats 94%  PHYSICAL EXAMINATION:  General:  No distress. Family at bedside. Sleeping Neuro: does not speak english, Seems appropriate in interaction w/ her daughter. Follows commands of family HEENT:  Tracheostomy in place.. Dressing removed. No scleral icterus. Moist mucus membranes. Stoma is eroded at 6 o'clock  Cardiovascular:  Regular rhythm.  Lungs:  On ventilator, no distress. Good Vts on vent. Diminished bases Abdomen: obese Musculoskeletal:  No joint effusion or deformity appreciated. Skin:  Warm & dry. No rash on exposed skin.  LABS:  BMET  Recent Labs Lab 12/21/15 0648 12/22/15 0638  NA 132* 136  K 5.2* 5.0  CL 98* 99*  CO2 25 26  BUN 78* 81*  CREATININE 0.97 0.99  GLUCOSE 184* 114*    CBC  Recent Labs Lab 12/20/15 0633  HGB 8.3*  HCT 27.5*   Glucose No results for input(s): GLUCAP in the last 168 hours.  Imaging No results found.  SIGNIFICANT EVENTS: 3/2 - Tracheostomy placement by ENT 3/3 - Discharge to LTAC  LINES/TUBES: Shiley Trach 8.0 3/2 (by ENT in OR)>>> L IJ CVL 2/14>>> NGT R Nare 3/2>>>  ASSESSMENT / PLAN:    Tracheostomy dependence  Tracheal stoma erosion  Bleeding around tracheostomy in setting of uremia:  S/p DDAVP 0.3 g/kilogram IV 1. Also s/p 1 unit of platelets; now resolved.  Hypercarbic Respiratory Failure secondary to CHF, pleural effusions, LLL PNA History of CAD status post multiple stents & CABG AKI w/ uremia and hypernatremia-->improved as of 3/13 HTN CHF Unclear BUN Elevation Anemia -->recurrent w/out clear etiology Thrombocytopenia - resolved Elevated PTT Hx CVA - prior to admit baseline bed  bound since 2009, in diapers but normal in conversation  82 year old female who is s/p long/complicated course with respiratory failure secondary to congestive heart failure, pleural effusions, and left lower lobe pneumonia. Patient was extubated and subsequently reintubated with hemoptysis requiring bronchoscopy for blood clots and plugging of endotracheal tube. The patient subsequently suffered a left-sided pneumothorax requiring chest tube placement. Patient has had significant protein calorie malnutrition as well as thrombocytopenia that had previously resolved.   82 year old female with morbid obesity and heart failure who was intubated and subsequently trached after a CHF exac and LLL PNA.  Patient evidently weans very well at night when she more awake.  Tolerated 8 hours TC overnight.  On exam, she is agitated and has some crackles.  I reviewed CXR myself, trach in good position.  Daughter updated at length bedside.  Discussed with SSH-MD and RT.  Respiratory failure:  - Keep dry.  - Wean as able.  - Home vent being brought in today for family to train on.  Hypoxemia with fluid overload.  - Diureses.  - Minimize intake as able.  Deconditioning:  - PT efforts.  - Mobilize.  - May even consider OOB to chair while on the vent.  Tracheostomy status:  - Maintain trach size and type.  - No decannulation.  Alyson ReedyWesam G. Yacoub, M.D. [facility]. Pager: [phone]. After hours pager: [phone].

## 2015-12-27 DIAGNOSIS — J9690 Respiratory failure, unspecified, unspecified whether with hypoxia or hypercapnia: Secondary | ICD-10-CM | POA: Insufficient documentation

## 2015-12-27 DIAGNOSIS — Z431 Encounter for attention to gastrostomy: Secondary | ICD-10-CM | POA: Insufficient documentation

## 2015-12-27 LAB — BASIC METABOLIC PANEL
Anion gap: 11 (ref 5–15)
BUN: 77 mg/dL — AB (ref 6–20)
CHLORIDE: 99 mmol/L — AB (ref 101–111)
CO2: 28 mmol/L (ref 22–32)
Calcium: 8.3 mg/dL — ABNORMAL LOW (ref 8.9–10.3)
Creatinine, Ser: 0.86 mg/dL (ref 0.44–1.00)
GFR calc Af Amer: >60 mL/min (ref >60)
GFR calc non Af Amer: >60 mL/min (ref >60)
GLUCOSE: 226 mg/dL — AB (ref 65–99)
POTASSIUM: 5.6 mmol/L — AB (ref 3.5–5.1)
Sodium: 138 mmol/L (ref 135–145)

## 2015-12-27 LAB — CBC
HCT: 30.3 % — ABNORMAL LOW (ref 36.0–46.0)
HEMOGLOBIN: 8.8 g/dL — AB (ref 12.0–15.0)
MCH: 26.5 pg (ref 26.0–34.0)
MCHC: 29 g/dL — ABNORMAL LOW (ref 30.0–36.0)
MCV: 91.3 fL (ref 78.0–100.0)
Platelets: 163 10*3/uL (ref 150–400)
RBC: 3.32 MIL/uL — AB (ref 3.87–5.11)
RDW: 18.8 % — ABNORMAL HIGH (ref 11.5–15.5)
WBC: 4.5 10*3/uL (ref 4.0–10.5)

## 2015-12-28 DIAGNOSIS — J9600 Acute respiratory failure, unspecified whether with hypoxia or hypercapnia: Secondary | ICD-10-CM

## 2015-12-28 DIAGNOSIS — R0902 Hypoxemia: Secondary | ICD-10-CM

## 2015-12-28 LAB — POTASSIUM: POTASSIUM: 5.2 mmol/L — AB (ref 3.5–5.1)

## 2015-12-28 NOTE — Progress Notes (Signed)
PULMONARY / CRITICAL CARE MEDICINE   Name: Monique RistKhaldia A [name] MRN: [id number] DOB: 09-05-1931    ADMISSION DATE:  11/25/2015 CONSULTATION DATE:  11/25/2015  REFERRING MD:  [facility]  CHIEF COMPLAINT:  Bleeding Around Tracheostomy   SUBJECTIVE:  No distress on vent  VITAL SIGNS:  Vital signs reviewed. Abnormal values will appear under impression plan section.    PHYSICAL EXAMINATION:  General:  No distress. Family at bedside. awake Neuro: does not speak english, Seems appropriate in interaction w/ her daughter. Follows commands of family HEENT:  Tracheostomy in place.. Dressing removed. No scleral icterus. Moist mucus membranes. Stoma is eroded at 6 o'clock  Cardiovascular:  Regular rhythm.  Lungs:  On ventilator, no distress. Good Vts on vent. Diminished bases Abdomen: obese Musculoskeletal:  No joint effusion or deformity appreciated. Skin:  Warm & dry. No rash on exposed skin.  LABS:  BMET  Recent Labs Lab 12/22/15 0638 12/27/15 0657 12/28/15 0518  NA 136 138  --   K 5.0 5.6* 5.2*  CL 99* 99*  --   CO2 26 28  --   BUN 81* 77*  --   CREATININE 0.99 0.86  --   GLUCOSE 114* 226*  --     CBC  Recent Labs Lab 12/27/15 0657  WBC 4.5  HGB 8.8*  HCT 30.3*  PLT 163   Glucose No results for input(s): GLUCAP in the last 168 hours.  Imaging No results found.  SIGNIFICANT EVENTS: 3/2 - Tracheostomy placement by ENT 3/3 - Discharge to LTAC 4/5 dc home with home vent  LINES/TUBES: Shiley Trach 8.0 3/2 (by ENT in OR)>>>   ASSESSMENT / PLAN:    Tracheostomy dependence  Tracheal stoma erosion  Bleeding around tracheostomy in setting of uremia:  S/p DDAVP 0.3 g/kilogram IV 1. Also s/p 1 unit of platelets; now resolved.  Hypercarbic Respiratory Failure secondary to CHF, pleural effusions, LLL PNA History of CAD status post multiple stents & CABG AKI w/ uremia and hypernatremia-->improved as of 3/13 HTN CHF Unclear BUN Elevation Anemia  -->recurrent w/out clear etiology Thrombocytopenia - resolved Elevated PTT Hx CVA - prior to admit baseline bed bound since 2009, in diapers but normal in conversation 4/5 to go home on home vent  Today PCCM will sign off.  Brett CanalesSteve Minor ACNP [facility] Pager [phone] till 3 pm If no answer page [phone] 12/28/2015, 10:19 AM  Attending Note:  82 year old female with morbid obesity and heart failure who was intubated and subsequently trached after a CHF exac and LLL PNA. Patient evidently weans very well at night when she more awake. Tolerated 8 hours TC overnight. On exam, she is agitated and has some crackles. I reviewed CXR myself, trach in good position. Daughter updated at length bedside. Discussed with SSH-MD and RT.  Respiratory failure: - Keep dry. - Wean as able. - Home vent being brought in today for family to train on.  - To go home with vent on Monday.  Hypoxemia with fluid overload. - Diureses. - Minimize intake as able.  Deconditioning: - PT efforts. - Mobilize. - May even consider OOB to chair while on the vent.  Tracheostomy status: - Maintain trach size and type. - No decannulation.  PCCM will sign off, please call back if needed.  Alyson ReedyWesam G. Yacoub, M.D. [facility]. Pager: [phone]. After hours pager: [phone].

## 2015-12-29 ENCOUNTER — Telehealth: Payer: Self-pay | Admitting: Family Medicine

## 2015-12-29 LAB — POTASSIUM: POTASSIUM: 4.2 mmol/L (ref 3.5–5.1)

## 2015-12-29 LAB — GRAM STAIN

## 2015-12-29 NOTE — Telephone Encounter (Signed)
Called and left a detailed message for [name] to inform of PCP notification.

## 2015-12-29 NOTE — Telephone Encounter (Signed)
Caller name:Marie Hairston Relationship to patient:Case manager at [facility]  Can be reached:[phone] Pharmacy:  Reason for call:They would like to know if you would be willing to follow the care of this patient when she goes home with a vent and a private duty nurse

## 2015-12-29 NOTE — Telephone Encounter (Signed)
[facility] I am not sure who does home visits.  I would recommend working with the Care Manager or Social Worker at her current facility

## 2015-12-29 NOTE — Telephone Encounter (Signed)
I am not qualified to manage vent settings nor do I have a way to see a bed bound/homebound pt (we are not able to do home visits).  She would need a provider able to come to the home (possibly [facility])

## 2015-12-29 NOTE — Telephone Encounter (Signed)
Pt daughter notified   

## 2015-12-29 NOTE — Telephone Encounter (Signed)
Does Dr Beverely Lowabori know someone who would follow this patient at home.  The daughter Angelyn PuntRaja is calling to ask.  [phone]

## 2015-12-29 NOTE — Telephone Encounter (Signed)
I do not see how our small office would even accommodate her on a stretcher.  I am by no means trying to stop caring for pt but my oath is to do what is best for the patient and what is best would be to find someone to care for her in home

## 2015-12-29 NOTE — Telephone Encounter (Signed)
Pt would like to know if the patient is brought to you by ambulance. I advised that our office is not equipped to handle the patient with a vent, also pt daughter advised that due to stress on the patient and the viral contact potential in the office that Dr. Beverely Lowabori stands by her decision that the best course of treating the patient would be with an in home care physician.

## 2016-01-02 LAB — BASIC METABOLIC PANEL
Anion gap: 12 (ref 5–15)
BUN: 70 mg/dL — AB (ref 6–20)
CALCIUM: 8.1 mg/dL — AB (ref 8.9–10.3)
CO2: 32 mmol/L (ref 22–32)
CREATININE: 0.85 mg/dL (ref 0.44–1.00)
Chloride: 100 mmol/L — ABNORMAL LOW (ref 101–111)
GFR calc Af Amer: >60 mL/min (ref >60)
GLUCOSE: 213 mg/dL — AB (ref 65–99)
Potassium: 4.2 mmol/L (ref 3.5–5.1)
Sodium: 144 mmol/L (ref 135–145)

## 2016-01-02 LAB — CULTURE, RESPIRATORY

## 2016-01-02 LAB — CBC
HEMATOCRIT: 29 % — AB (ref 36.0–46.0)
HEMOGLOBIN: 8.6 g/dL — AB (ref 12.0–15.0)
MCH: 27.2 pg (ref 26.0–34.0)
MCHC: 29.7 g/dL — AB (ref 30.0–36.0)
MCV: 91.8 fL (ref 78.0–100.0)
Platelets: 126 10*3/uL — ABNORMAL LOW (ref 150–400)
RBC: 3.16 MIL/uL — AB (ref 3.87–5.11)
RDW: 19 % — ABNORMAL HIGH (ref 11.5–15.5)
WBC: 7.1 10*3/uL (ref 4.0–10.5)

## 2016-01-02 LAB — CULTURE, RESPIRATORY W GRAM STAIN

## 2016-01-04 ENCOUNTER — Ambulatory Visit (HOSPITAL_COMMUNITY)
Admission: AD | Admit: 2016-01-04 | Discharge: 2016-01-04 | Disposition: A | Discharge status: Home / Self Care | Payer: Medicare Other | Source: Other Acute Inpatient Hospital | Attending: Internal Medicine | Admitting: Internal Medicine

## 2016-01-04 DIAGNOSIS — Z9911 Dependence on respirator [ventilator] status: Secondary | ICD-10-CM | POA: Insufficient documentation

## 2016-01-04 LAB — POTASSIUM: Potassium: 4.1 mmol/L (ref 3.5–5.1)

## 2016-01-09 ENCOUNTER — Telehealth: Payer: Self-pay | Admitting: Cardiovascular Disease

## 2016-01-09 ENCOUNTER — Emergency Department (HOSPITAL_COMMUNITY): Payer: Medicare Other

## 2016-01-09 ENCOUNTER — Inpatient Hospital Stay (HOSPITAL_COMMUNITY)
Admission: EM | Admit: 2016-01-09 | Discharge: 2016-01-13 | DRG: 291 | Disposition: A | Discharge status: Other Acute Inpatient Hospital | Payer: Medicare Other | Attending: Internal Medicine | Admitting: Internal Medicine

## 2016-01-09 ENCOUNTER — Encounter (HOSPITAL_COMMUNITY): Payer: Self-pay | Admitting: Nurse Practitioner

## 2016-01-09 DIAGNOSIS — F329 Major depressive disorder, single episode, unspecified: Secondary | ICD-10-CM | POA: Diagnosis not present

## 2016-01-09 DIAGNOSIS — E669 Obesity, unspecified: Secondary | ICD-10-CM | POA: Diagnosis present

## 2016-01-09 DIAGNOSIS — Z951 Presence of aortocoronary bypass graft: Secondary | ICD-10-CM | POA: Diagnosis not present

## 2016-01-09 DIAGNOSIS — I69351 Hemiplegia and hemiparesis following cerebral infarction affecting right dominant side: Secondary | ICD-10-CM

## 2016-01-09 DIAGNOSIS — Z431 Encounter for attention to gastrostomy: Secondary | ICD-10-CM

## 2016-01-09 DIAGNOSIS — J9621 Acute and chronic respiratory failure with hypoxia: Secondary | ICD-10-CM | POA: Diagnosis present

## 2016-01-09 DIAGNOSIS — I480 Paroxysmal atrial fibrillation: Secondary | ICD-10-CM | POA: Insufficient documentation

## 2016-01-09 DIAGNOSIS — I340 Nonrheumatic mitral (valve) insufficiency: Secondary | ICD-10-CM | POA: Insufficient documentation

## 2016-01-09 DIAGNOSIS — Z9911 Dependence on respirator [ventilator] status: Secondary | ICD-10-CM

## 2016-01-09 DIAGNOSIS — E876 Hypokalemia: Secondary | ICD-10-CM | POA: Diagnosis present

## 2016-01-09 DIAGNOSIS — Z931 Gastrostomy status: Secondary | ICD-10-CM | POA: Diagnosis not present

## 2016-01-09 DIAGNOSIS — Z7952 Long term (current) use of systemic steroids: Secondary | ICD-10-CM | POA: Diagnosis not present

## 2016-01-09 DIAGNOSIS — R5381 Other malaise: Secondary | ICD-10-CM | POA: Diagnosis present

## 2016-01-09 DIAGNOSIS — R7989 Other specified abnormal findings of blood chemistry: Secondary | ICD-10-CM | POA: Diagnosis not present

## 2016-01-09 DIAGNOSIS — I2510 Atherosclerotic heart disease of native coronary artery without angina pectoris: Secondary | ICD-10-CM | POA: Diagnosis present

## 2016-01-09 DIAGNOSIS — J9601 Acute respiratory failure with hypoxia: Secondary | ICD-10-CM | POA: Insufficient documentation

## 2016-01-09 DIAGNOSIS — I10 Essential (primary) hypertension: Secondary | ICD-10-CM | POA: Diagnosis present

## 2016-01-09 DIAGNOSIS — E785 Hyperlipidemia, unspecified: Secondary | ICD-10-CM | POA: Diagnosis not present

## 2016-01-09 DIAGNOSIS — D509 Iron deficiency anemia, unspecified: Secondary | ICD-10-CM | POA: Diagnosis not present

## 2016-01-09 DIAGNOSIS — Z955 Presence of coronary angioplasty implant and graft: Secondary | ICD-10-CM | POA: Diagnosis not present

## 2016-01-09 DIAGNOSIS — F419 Anxiety disorder, unspecified: Secondary | ICD-10-CM | POA: Diagnosis present

## 2016-01-09 DIAGNOSIS — G9340 Encephalopathy, unspecified: Secondary | ICD-10-CM | POA: Diagnosis present

## 2016-01-09 DIAGNOSIS — J9612 Chronic respiratory failure with hypercapnia: Secondary | ICD-10-CM

## 2016-01-09 DIAGNOSIS — R1319 Other dysphagia: Secondary | ICD-10-CM | POA: Diagnosis not present

## 2016-01-09 DIAGNOSIS — K219 Gastro-esophageal reflux disease without esophagitis: Secondary | ICD-10-CM | POA: Diagnosis present

## 2016-01-09 DIAGNOSIS — E46 Unspecified protein-calorie malnutrition: Secondary | ICD-10-CM | POA: Diagnosis present

## 2016-01-09 DIAGNOSIS — Z7189 Other specified counseling: Secondary | ICD-10-CM | POA: Diagnosis not present

## 2016-01-09 DIAGNOSIS — I509 Heart failure, unspecified: Secondary | ICD-10-CM

## 2016-01-09 DIAGNOSIS — E1151 Type 2 diabetes mellitus with diabetic peripheral angiopathy without gangrene: Secondary | ICD-10-CM | POA: Diagnosis not present

## 2016-01-09 DIAGNOSIS — Z7401 Bed confinement status: Secondary | ICD-10-CM

## 2016-01-09 DIAGNOSIS — E874 Mixed disorder of acid-base balance: Secondary | ICD-10-CM | POA: Diagnosis present

## 2016-01-09 DIAGNOSIS — R609 Edema, unspecified: Secondary | ICD-10-CM | POA: Diagnosis present

## 2016-01-09 DIAGNOSIS — I5023 Acute on chronic systolic (congestive) heart failure: Secondary | ICD-10-CM | POA: Diagnosis present

## 2016-01-09 DIAGNOSIS — Z930 Tracheostomy status: Secondary | ICD-10-CM

## 2016-01-09 DIAGNOSIS — J811 Chronic pulmonary edema: Secondary | ICD-10-CM | POA: Diagnosis present

## 2016-01-09 DIAGNOSIS — Z6837 Body mass index (BMI) 37.0-37.9, adult: Secondary | ICD-10-CM

## 2016-01-09 DIAGNOSIS — E662 Morbid (severe) obesity with alveolar hypoventilation: Secondary | ICD-10-CM | POA: Diagnosis present

## 2016-01-09 DIAGNOSIS — I429 Cardiomyopathy, unspecified: Secondary | ICD-10-CM | POA: Diagnosis present

## 2016-01-09 DIAGNOSIS — L89893 Pressure ulcer of other site, stage 3: Secondary | ICD-10-CM | POA: Diagnosis present

## 2016-01-09 DIAGNOSIS — Z79899 Other long term (current) drug therapy: Secondary | ICD-10-CM

## 2016-01-09 DIAGNOSIS — N183 Chronic kidney disease, stage 3 (moderate): Secondary | ICD-10-CM | POA: Diagnosis not present

## 2016-01-09 DIAGNOSIS — J189 Pneumonia, unspecified organism: Secondary | ICD-10-CM

## 2016-01-09 DIAGNOSIS — F0390 Unspecified dementia without behavioral disturbance: Secondary | ICD-10-CM | POA: Diagnosis present

## 2016-01-09 DIAGNOSIS — E1122 Type 2 diabetes mellitus with diabetic chronic kidney disease: Secondary | ICD-10-CM | POA: Diagnosis not present

## 2016-01-09 DIAGNOSIS — I248 Other forms of acute ischemic heart disease: Secondary | ICD-10-CM | POA: Diagnosis present

## 2016-01-09 DIAGNOSIS — I25810 Atherosclerosis of coronary artery bypass graft(s) without angina pectoris: Secondary | ICD-10-CM | POA: Insufficient documentation

## 2016-01-09 DIAGNOSIS — Y95 Nosocomial condition: Secondary | ICD-10-CM | POA: Diagnosis present

## 2016-01-09 DIAGNOSIS — Z515 Encounter for palliative care: Secondary | ICD-10-CM | POA: Diagnosis not present

## 2016-01-09 DIAGNOSIS — Z794 Long term (current) use of insulin: Secondary | ICD-10-CM

## 2016-01-09 DIAGNOSIS — R778 Other specified abnormalities of plasma proteins: Secondary | ICD-10-CM | POA: Diagnosis present

## 2016-01-09 DIAGNOSIS — Z7901 Long term (current) use of anticoagulants: Secondary | ICD-10-CM

## 2016-01-09 DIAGNOSIS — I130 Hypertensive heart and chronic kidney disease with heart failure and stage 1 through stage 4 chronic kidney disease, or unspecified chronic kidney disease: Principal | ICD-10-CM | POA: Diagnosis present

## 2016-01-09 DIAGNOSIS — J9690 Respiratory failure, unspecified, unspecified whether with hypoxia or hypercapnia: Secondary | ICD-10-CM | POA: Diagnosis present

## 2016-01-09 DIAGNOSIS — R0602 Shortness of breath: Secondary | ICD-10-CM

## 2016-01-09 DIAGNOSIS — E1159 Type 2 diabetes mellitus with other circulatory complications: Secondary | ICD-10-CM | POA: Diagnosis present

## 2016-01-09 HISTORY — DX: Cardiomyopathy, unspecified: I42.9

## 2016-01-09 HISTORY — DX: Anemia, unspecified: D64.9

## 2016-01-09 HISTORY — DX: Depression, unspecified: F32.A

## 2016-01-09 HISTORY — DX: Chronic kidney disease, stage 3 unspecified: N18.30

## 2016-01-09 HISTORY — DX: Anxiety disorder, unspecified: F41.9

## 2016-01-09 HISTORY — DX: Morbid (severe) obesity due to excess calories: E66.01

## 2016-01-09 HISTORY — DX: Chronic kidney disease, stage 3 (moderate): N18.3

## 2016-01-09 HISTORY — DX: Major depressive disorder, single episode, unspecified: F32.9

## 2016-01-09 HISTORY — DX: Respiratory failure, unspecified, unspecified whether with hypoxia or hypercapnia: J96.90

## 2016-01-09 HISTORY — DX: Atherosclerotic heart disease of native coronary artery without angina pectoris: I25.10

## 2016-01-09 HISTORY — DX: Disorder of kidney and ureter, unspecified: N28.9

## 2016-01-09 LAB — CBC WITH DIFFERENTIAL/PLATELET
BASOS PCT: 0 %
Basophils Absolute: 0 10*3/uL (ref 0.0–0.1)
EOS ABS: 0 10*3/uL (ref 0.0–0.7)
EOS PCT: 1 %
HCT: 27.6 % — ABNORMAL LOW (ref 36.0–46.0)
HEMOGLOBIN: 8.3 g/dL — AB (ref 12.0–15.0)
LYMPHS ABS: 1 10*3/uL (ref 0.7–4.0)
Lymphocytes Relative: 14 %
MCH: 27.1 pg (ref 26.0–34.0)
MCHC: 30.1 g/dL (ref 30.0–36.0)
MCV: 90.2 fL (ref 78.0–100.0)
MONOS PCT: 9 %
Monocytes Absolute: 0.7 10*3/uL (ref 0.1–1.0)
NEUTROS PCT: 76 %
Neutro Abs: 5.6 10*3/uL (ref 1.7–7.7)
PLATELETS: 150 10*3/uL (ref 150–400)
RBC: 3.06 MIL/uL — ABNORMAL LOW (ref 3.87–5.11)
RDW: 18 % — ABNORMAL HIGH (ref 11.5–15.5)
WBC: 7.3 10*3/uL (ref 4.0–10.5)

## 2016-01-09 LAB — COMPREHENSIVE METABOLIC PANEL
ALK PHOS: 183 U/L — AB (ref 38–126)
ALT: 103 U/L — AB (ref 14–54)
ANION GAP: 11 (ref 5–15)
AST: 104 U/L — ABNORMAL HIGH (ref 15–41)
Albumin: 1.9 g/dL — ABNORMAL LOW (ref 3.5–5.0)
BILIRUBIN TOTAL: 0.5 mg/dL (ref 0.3–1.2)
BUN: 70 mg/dL — ABNORMAL HIGH (ref 6–20)
CALCIUM: 8.4 mg/dL — AB (ref 8.9–10.3)
CO2: 31 mmol/L (ref 22–32)
CREATININE: 1.04 mg/dL — AB (ref 0.44–1.00)
Chloride: 98 mmol/L — ABNORMAL LOW (ref 101–111)
GFR, EST AFRICAN AMERICAN: 56 mL/min — AB (ref >60)
GFR, EST NON AFRICAN AMERICAN: 49 mL/min — AB (ref >60)
Glucose, Bld: 196 mg/dL — ABNORMAL HIGH (ref 65–99)
Potassium: 3.4 mmol/L — ABNORMAL LOW (ref 3.5–5.1)
Sodium: 140 mmol/L (ref 135–145)
TOTAL PROTEIN: 5.3 g/dL — AB (ref 6.5–8.1)

## 2016-01-09 LAB — LIPASE, BLOOD: LIPASE: 42 U/L (ref 11–51)

## 2016-01-09 LAB — BRAIN NATRIURETIC PEPTIDE: B Natriuretic Peptide: 3385.1 pg/mL — ABNORMAL HIGH (ref 0.0–100.0)

## 2016-01-09 LAB — I-STAT ARTERIAL BLOOD GAS, ED
ACID-BASE EXCESS: 12 mmol/L — AB (ref 0.0–2.0)
BICARBONATE: 35.6 meq/L — AB (ref 20.0–24.0)
O2 SAT: 96 %
PO2 ART: 74 mmHg — AB (ref 80.0–100.0)
TCO2: 37 mmol/L (ref 0–100)
pCO2 arterial: 43 mmHg (ref 35.0–45.0)
pH, Arterial: 7.527 — ABNORMAL HIGH (ref 7.350–7.450)

## 2016-01-09 LAB — I-STAT TROPONIN, ED: TROPONIN I, POC: 0.89 ng/mL — AB (ref 0.00–0.08)

## 2016-01-09 LAB — TROPONIN I: Troponin I: 1.23 ng/mL (ref <0.031)

## 2016-01-09 LAB — I-STAT CG4 LACTIC ACID, ED: Lactic Acid, Venous: 1.15 mmol/L (ref 0.5–2.0)

## 2016-01-09 MED ORDER — DEXTROSE 5 % IV SOLN
2.0000 g | Freq: Once | INTRAVENOUS | Status: AC
  Administered 2016-01-09: 2 g via INTRAVENOUS
  Filled 2016-01-09: qty 2

## 2016-01-09 MED ORDER — FUROSEMIDE 10 MG/ML IJ SOLN
40.0000 mg | Freq: Once | INTRAMUSCULAR | Status: AC
Start: 2016-01-09 — End: 2016-01-09
  Administered 2016-01-09: 40 mg via INTRAVENOUS
  Filled 2016-01-09: qty 4

## 2016-01-09 MED ORDER — LEVOFLOXACIN IN D5W 750 MG/150ML IV SOLN
750.0000 mg | Freq: Once | INTRAVENOUS | Status: AC
  Administered 2016-01-09: 750 mg via INTRAVENOUS
  Filled 2016-01-09: qty 150

## 2016-01-09 MED ORDER — ASPIRIN 300 MG RE SUPP
300.0000 mg | Freq: Once | RECTAL | Status: AC
  Administered 2016-01-09: 300 mg via RECTAL
  Filled 2016-01-09: qty 1

## 2016-01-09 MED ORDER — VANCOMYCIN HCL 10 G IV SOLR
1500.0000 mg | Freq: Once | INTRAVENOUS | Status: AC
  Administered 2016-01-09: 1500 mg via INTRAVENOUS
  Filled 2016-01-09: qty 1500

## 2016-01-09 MED ORDER — POTASSIUM CHLORIDE 20 MEQ PO PACK
40.0000 meq | PACK | Freq: Once | ORAL | Status: AC
  Administered 2016-01-09: 40 meq via ORAL
  Filled 2016-01-09: qty 2

## 2016-01-09 MED ORDER — POTASSIUM CHLORIDE 10 MEQ/100ML IV SOLN
10.0000 meq | Freq: Once | INTRAVENOUS | Status: AC
  Administered 2016-01-09: 10 meq via INTRAVENOUS
  Filled 2016-01-09: qty 100

## 2016-01-09 NOTE — ED Notes (Signed)
Attempted to draw blood cultures without success, phlebotomy at bedside.

## 2016-01-09 NOTE — Discharge Planning (Signed)
EDCM consulted to assist with pt LTACH placement [facility].  Pt was resident [facility] and due to long term trach needs, pt was [facility].  Pt can not return to [facility] at this time as pt also has to have 3 day ICU stay.  EDP notified.

## 2016-01-09 NOTE — Consult Note (Signed)
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 Family notes that since last Wednesday, patient has progressively worsening lower extremity edema, and they do not think she is getting any diuretics at [facility]. Family notes that when she was hospitalized here, she was getting IV lasix, but not at the long term care facility.  Family says patient did not have any fever, cough, chest pain, SOB, or other symptoms. Patient has some off and on pain in the abdominal area .  Patient is currently being weaned off of the vent, and she was weaned on Friday for 3.5 hours.   Patient was discharged from [facility] on 3/3 , and sent to [facility] from 3/3 to 4/11 . She is on PEG tube. Given patient's functional deficits, multiple comorbidities, high risk for decompensation, she was deemed appropriate for transfer to [facility] subacute rehab where she was at before she came here. While there, she had a CT abd done which showed bilateral pleural effusions, consolidation in lower lobes, and abdominal and pelvic ascites.  PCCM is consulted for vent management. She is being admitted by IM.   PAST MEDICAL HISTORY :  She  has a past medical history of Diabetes mellitus; Hypertension; Stroke [facility]) (Right Side Weakness); CHF (congestive heart failure) (HCC); Chest pain; Pain in limb; Coronary artery disease; Renal disorder; Chronic kidney disease (CKD), stage III (moderate); Anemia; Depression; Anxiety; Respiratory failure (HCC); Cardiomyopathy (HCC); and Morbid obesity (HCC).  PAST SURGICAL HISTORY: She  has past surgical history that includes Cardiac  surgery; Cardiac catheterization (10/27/2004); Cardiac catheterization (09/26/2004); Coronary artery bypass graft (02/24/1999); and Tracheostomy tube placement (N/A, 11/24/2015).  Allergies  Allergen Reactions  . Penicillins Anaphylaxis, Itching and Swelling    Has patient had a PCN reaction causing immediate rash, facial/tongue/throat swelling, SOB or lightheadedness with hypotension: Yes Has patient had a PCN reaction causing severe rash involving mucus membranes or skin necrosis: No  Has patient had a PCN reaction that required hospitalization: Unknown Has patient had a PCN reaction occurring within the last 10 years: No  If all of the above answers are "NO", then may proceed with Cephalosporin use.     No current facility-administered medications on file prior to encounter.   Current Outpatient Prescriptions on File Prior to Encounter  Medication Sig  . albuterol (PROVENTIL) (2.5 MG/3ML) 0.083% nebulizer solution Take 3 mLs (2.5 mg total) by nebulization every 6 (six) hours. (Patient not taking: Reported on 01/09/2016)  . carvedilol (COREG) 3.125 MG tablet Take 1 tablet (3.125 mg total) by mouth 2 (two) times daily with a meal. (Patient not taking: Reported on 01/09/2016)  . chlorhexidine gluconate (PERIDEX) 0.12 % solution 15 mLs by Mouth Rinse route 2 (two) times daily. (Patient not taking: Reported on 01/09/2016)  . fentaNYL (SUBLIMAZE) 100 MCG/2ML injection Inject 1-4 mLs (50-200 mcg total) into the vein every 2 (two) hours as needed (breakthrough pain). (Patient not taking: Reported on 01/09/2016)  . folic acid (FOLVITE) 1 MG tablet TAKE ONE TABLET BY MOUTH ONCE DAILY (Patient not taking: Reported on 01/09/2016)  . hydrALAZINE (APRESOLINE) 20 MG/ML injection Inject 0.5 mLs (10 mg total)  into the vein every 4 (four) hours as needed (FOR SYSTOLIC BLOOD PRESSURE MORE THAN 160). (Patient not taking: Reported on 01/09/2016)  . hydrALAZINE (APRESOLINE) 25 MG tablet Take 1 tablet (25 mg total) by mouth  every 8 (eight) hours. (Patient not taking: Reported on 01/09/2016)  . insulin aspart (NOVOLOG) 100 UNIT/ML injection Correction coverage: Sensitive (thin, NPO, renal) CBG < 70: implement hypoglycemia protocol CBG 70 - 120: 0 units CBG 121 - 150: 1 unit CBG 151 - 200: 2 units CBG 201 - 250: 3 units CBG 251 - 300: 5 units CBG 301 - 350: 7 units CBG 351 - 400: 9 units CBG > 400: call MD and obtain STAT lab verification (Patient not taking: Reported on 01/09/2016)  . isosorbide mononitrate (ISMO,MONOKET) 10 MG tablet Take 1.5 tablets (15 mg total) by mouth 2 (two) times daily at 10 AM and 5 PM. (Patient not taking: Reported on 01/09/2016)  . methylPREDNISolone sodium succinate (SOLU-MEDROL) 40 mg/mL injection Inject 1 mL (40 mg total) into the vein daily. (Patient not taking: Reported on 01/09/2016)  . midazolam (VERSED) 2 MG/2ML SOLN injection Inject 1-4 mLs (1-4 mg total) into the vein every 2 (two) hours as needed for agitation or sedation. (Patient not taking: Reported on 01/09/2016)  . Nutritional Supplements (FEEDING SUPPLEMENT, NEPRO CARB STEADY,) LIQD Place 1,000 mLs into feeding tube daily. (Patient not taking: Reported on 01/09/2016)  . nystatin (MYCOSTATIN) 100000 UNIT/ML suspension Take 5 mLs (500,000 Units total) by mouth 4 (four) times daily. (Patient not taking: Reported on 01/09/2016)  . pantoprazole (PROTONIX) 40 MG injection Inject 40 mg into the vein daily. (Patient not taking: Reported on 01/09/2016)  . primidone (MYSOLINE) 50 MG tablet Take 1 tablet (50 mg total) by mouth at bedtime. (Patient not taking: Reported on 01/09/2016)  . sodium chloride flush (NS) 0.9 % SOLN 10-40 mLs by Intracatheter route every 12 (twelve) hours. (Patient not taking: Reported on 01/09/2016)  . sodium chloride flush (NS) 0.9 % SOLN 10-40 mLs by Intracatheter route as needed (flush). (Patient not taking: Reported on 01/09/2016)  . [DISCONTINUED] pregabalin (LYRICA) 75 MG capsule Take 75 mg by mouth daily.       FAMILY HISTORY:  Her indicated that her mother is deceased. She indicated that her father is deceased. She indicated that all of her three sisters are alive. She indicated that all of her three brothers are deceased. She indicated that her maternal grandmother is deceased. She indicated that her maternal grandfather is deceased. She indicated that her paternal grandmother is deceased. She indicated that her paternal grandfather is deceased. She indicated that all of her three childs are alive.   SOCIAL HISTORY: She  reports that she has never smoked. She has never used smokeless tobacco. She reports that she does not drink alcohol or use illicit drugs.  REVIEW OF SYSTEMS:   Pertinent ROS are noted in the HPI   VITAL SIGNS: BP 150/82 mmHg  Pulse 95  Temp(Src) 99.3 F (37.4 C) (Axillary)  Resp 23  Ht 5\' 4"  (1.626 m)  SpO2 99%  HEMODYNAMICS:    VENTILATOR SETTINGS: Vent Mode:  [-] PRVC FiO2 (%):  [30 %] 30 % Set Rate:  [18 bmp] 18 bmp Vt Set:  [380 mL] 380 mL PEEP:  [5 cmH20] 5 cmH20 Plateau Pressure:  [21 cmH20-25 cmH20] 25 cmH20  INTAKE / OUTPUT: I/O last 3 completed shifts: In: 500 [I.V.:500] Out: -   PHYSICAL EXAMINATION: General:  Alert, on trach Neuro:  Alert, on vent,  PERRLA HEENT:  NCAT, mmm, no pharyngeal erythema Cardiovascular: tachycardic to 100s Lungs:  Diffuse crackles  Abdomen:  Obese, +BS Extremities: 3+ pitting edema to the upper thigh, right inner knee has post-surgical scar  LABS:  BMET  Recent Labs Lab 01/04/16 0816 01/09/16 1434  NA  --  140  K 4.1 3.4*  CL  --  98*  CO2  --  31  BUN  --  70*  CREATININE  --  1.04*  GLUCOSE  --  196*    Electrolytes  Recent Labs Lab 01/09/16 1434  CALCIUM 8.4*    CBC  Recent Labs Lab 01/09/16 1433  WBC 7.3  HGB 8.3*  HCT 27.6*  PLT 150    Coag's No results for input(s): APTT, INR in the last 168 hours.  Sepsis Markers  Recent Labs Lab 01/09/16 1434  LATICACIDVEN 1.15     ABG  Recent Labs Lab 01/09/16 1405  PHART 7.527*  PCO2ART 43.0  PO2ART 74.0*    Liver Enzymes  Recent Labs Lab 01/09/16 1434  AST 104*  ALT 103*  ALKPHOS 183*  BILITOT 0.5  ALBUMIN 1.9*    Cardiac Enzymes No results for input(s): TROPONINI, PROBNP in the last 168 hours.  Glucose No results for input(s): GLUCAP in the last 168 hours.  Imaging Dg Chest Port 1 View  01/09/2016  CLINICAL DATA:  Chronic respiratory failure. Bilateral pleural effusions. Fever. EXAM: PORTABLE CHEST 1 VIEW COMPARISON:  12/19/2015, 12/16/2015, 12/12/2015 and 12/02/2015 FINDINGS: Endotracheal tube remains in good position. Central line has been removed since the prior study. There is new pulmonary vascular congestion with new bilateral slight perihilar edema as well as edema at the right lung base. Haziness at the right lung base has increased since the prior study. There is chronic cardiomegaly. CABG. Aortic atherosclerosis. Chronic haziness at the left base is stable IMPRESSION: Findings consistent with congestive heart failure with new pulmonary vascular congestion and bilateral pulmonary edema. Electronically Signed   By: Francene BoyersJames  Maxwell M.D.   On: 01/09/2016 14:25     STUDIES:  CT abd pelvis 4/15 at [facility]> CXR 4/17 >  CULTURES: Bcr 4/17 >  ANTIBIOTICS: Aztreonam 4/17> Levofloxacin 4/17> Vanc 4/17>   SIGNIFICANT EVENTS:   LINES/TUBES: ETT    82 yo pleasant woman with late effects of CVA with encephalopathy, diabetes, HTn, history of CABG x4, CKD3, depression, anxiety, history of ileus, severe mobility deficits, who has had long course with respiratory failure secondary to CHF, pleural effusions, and left lower lobe pneumonia. The most recent CT confirmed this. She is currently on a vent with trach. Her BNP was very elevated at 3385 consistent with a congestive heart failure exacerbation. Most recent echo shows EF of 35-40% with akinesis of basal-midanteroseptal myocardium. ABG  shows pH of 7.52, pCO2 of 43, pO2 of 74, bicarb of 35  -continue weaning effort on the vent -do not think she would be able to be decannulated -aim for negative volume status -wound care consult for trach site eval   Rest of the management per primary team  Signed Deneise LeverParth Saraiya PGy 1 IMTS   Pulmonary and Critical Care Medicine [facility] Pager: [phone]  01/09/2016, 10:04 PM  Attending Note:  82 year old female well known to PCCM service who has a chronic trach now after suffering a CVA.  Patient is morbidly obese and is vent dependent in vent SNF.  On exam, diffuse crackles noted.  I reviewed CXR myself, evidence of pulmonary edema noted, trach  in place.  Discussed with residents.  Chronic respiratory failure, vent dependent:  - Continue current vent settings.  - Hold weaning for now.  - CXR intermittently.  - Titrate O2 for sat of 88-92%.  Acute pulmonary edema: due to CHF.  - Diureses as BP allows.  - F/U imaging PRN.  Acute exacerbation of CHF:  - Diureses.  - Lipitor.  - ?cards consult (Dr. Tresa Endo is her regular cardiologist).  Positive troponins:  - F/U level.  - EKG.  - ?cards consult, defer to primary.  Tracheostomy status:  - Maintain a cuffed 6 shiley trach.  - No change for now.  Patient seen and examined, agree with above note.  I dictated the care and orders written for this patient under my direction.  Alyson Reedy, MD [phone]

## 2016-01-09 NOTE — Progress Notes (Signed)
Patient came in from [facility] on the vent, placed on Servo I same settings as per home, ABG obtained.

## 2016-01-09 NOTE — Progress Notes (Signed)
Pharmacy Antibiotic Note  [name] is a 82 y.o. female admitted on 01/09/2016 with pneumonia.  Presents from [facility] with long-term vent management.  CT reveals possible pneumonia and pancreatitis.  History includes penicillin allergy with no history of cephalosporin use.  Pharmacy has been consulted for Vancomycin, Levofloxacin, and Aztreonam dosing.  On Admit: Afebrile (99.3), HR 87, RR 24, WBC 7.3 LA 1.15, SCr 1.04 (CrCl 45), Trop 1.15  Plan: --Vancomycin 1500 mg IV x 1, then 750 mg q12h --Levofloxacin 750 mg IV q48h --Aztreonam 2 g IV q8h --Obtain vanc trough at steady state (goal 15-20) --Follow renal function, clinical course and cultures     Temp (24hrs), Avg:99 F (37.2 C), Min:98.6 F (37 C), Max:99.3 F (37.4 C)   Recent Labs Lab 01/09/16 1433 01/09/16 1434  WBC 7.3  --   LATICACIDVEN  --  1.15    CrCl cannot be calculated (Unknown ideal weight.).    Allergies  Allergen Reactions  . Penicillins Anaphylaxis, Itching and Swelling    Has patient had a PCN reaction causing immediate rash, facial/tongue/throat swelling, SOB or lightheadedness with hypotension: Yes Has patient had a PCN reaction causing severe rash involving mucus membranes or skin necrosis: No  Has patient had a PCN reaction that required hospitalization: Unknown Has patient had a PCN reaction occurring within the last 10 years: No  If all of the above answers are "NO", then may proceed with Cephalosporin use.     Antimicrobials this admission: 4/17 Vanc >>  4/17 Levaquin >>  4/17 Aztreonam  Dose adjustments this admission:   Microbiology results: 4/17 BCx:    Thank you for allowing pharmacy to be a part of this patient's care.  [name] 01/09/2016 2:55 PM

## 2016-01-09 NOTE — ED Provider Notes (Signed)
Assumed care from Dr. Earlene Plateravis at 4:00 PM.  82 yo F with PMHx of chronic respiratory failure on vent who p/w tachycardia with any positional changes. LFTs worse, CT shows ? pancreatitis but lipase is normal. PNA noted on CT and T 100.8; ABX ordered. Mild trop elevation, likely demand. Crit care coming to admit due to ventilator dependence.  Critical care has evaluated. Labs/imaging reviewed. Pt has persistent tachypnea, and BNP markedly elevated with signs of CHF on CXR. Suspect there is a component of CHF with possible PNA. Critical care does not feel ICU needed but recommends Step Down for diuresis, monitoring. Pt remains on home vent settings. Lasix ordered IV. Will admit to Step Down under Hospitalist service.  Clinical Impression: 1. Acute on chronic systolic congestive heart failure (HCC)   2. Chronic respiratory failure with hypercapnia (HCC)   3. HAP (hospital-acquired pneumonia)   4. Elevated troponin     Disposition: Admit  Condition: Stable  Pt seen in conjunction with Dr. Collie SiadNanavati   Monique Deininger, MD 01/10/16 [phone]

## 2016-01-09 NOTE — ED Provider Notes (Signed)
CSN: [id number]     Arrival date & time 01/09/16  1335 History   First MD Initiated Contact with Patient 01/09/16 1343     Chief Complaint  Patient presents with  . Abdominal Pain     (Consider location/radiation/quality/duration/timing/severity/associated sxs/prior Treatment) HPI Patient is a very medically complex patient presents from [facility] for evaluation. The patient was primarily sent at the request of the family. They report that the patient was being cleaned and having her toenails clipped when she became very tachycardic. The staff at the facility reports that she is also been increasingly confused from her baseline, and had been complaining of abdominal pain on 4:15. She had an outpatient CT scan that showed possible pneumonia versus atelectasis, along with gallstones and inflammation of the pancreas. I spoke with the physician from [facility] who reports that the patient had sustained heart tachycardia to the 150s at that time she evaluated them, which sounds like maybe it may have been related to agitation, and the family insisted the patient be transferred here for admission.   Upon review of records, the patient had a temperature of 100.8 earlier today, and was given Tylenol. He also reports that she has been intermittently requiring increased at settings, and higher FiO2 than usual today. She arrives on her normal, home vent settings, oxygenating well and in no distress.  Past Medical History  Diagnosis Date  . Diabetes mellitus   . Hypertension   . Stroke [facility](HCC) Right Side Weakness  . CHF (congestive heart failure) (HCC)     2D ECHO, 09/13/2011 - EF 40-45%, normal  . 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 Pain in limb     BILATERAL EXTREMITY VENOUS DUPLEX, 01/08/2011 - no evidence of deep vein or  superficial thrombosis or Baker's cyst  . Coronary artery disease   . Renal disorder   . Chronic kidney disease (CKD), stage III (moderate)   . Anemia   . Depression   . Anxiety   . Respiratory failure (HCC)   . Cardiomyopathy (HCC)     EF 35%  . Morbid obesity [facility](HCC)    Past Surgical History  Procedure Laterality Date  . Cardiac surgery    . Cardiac catheterization  10/27/2004    3 stents placed, 3.5x28 proximally,3.5x33 mid segment, and 3.5x33 in the region of the crux, stenosis being reduced to 0% at proximal and mid segment and being reduced from 80% ot 10% in the stent at the cruxed segment  . Cardiac catheterization  09/26/2004    High grade 95% ostial stenosis in the RCA with 70% mid stenosis, 95% distal stenosis and total occlusion of the PDA with collaterals  . Coronary artery bypass graft  02/24/1999    x4; IMA to distal LAD, left radial to first circ, SVG to first diagonal, SVG to posterior descending coronary artery  . Tracheostomy tube placement N/A 11/24/2015    Procedure: TRACHEOSTOMY;  Surgeon: Osborn Cohoavid Shoemaker, MD;  [facility];  Service: ENT;  Laterality: N/A;   Family History  Problem Relation Age of Onset  . Cancer Mother     Lung  . Heart disease Brother   . Hypertension Brother   . Heart disease Brother   . Hypertension Brother    Social History  Substance Use Topics  . Smoking status: Never Smoker   .  Smokeless tobacco: Never Used  . Alcohol Use: No   OB History    No data available     Review of Systems  Unable to perform ROS: Dementia      Allergies  Penicillins  Home Medications   Prior to Admission medications   Medication Sig Start Date End Date Taking? Authorizing Provider  albuterol (PROVENTIL) (2.5 MG/3ML) 0.083% nebulizer solution Take 3 mLs (2.5 mg total) by nebulization every 6 (six) hours. 11/25/15   Jeanella Craze, NP  carvedilol (COREG) 3.125 MG tablet Take 1 tablet (3.125 mg total) by mouth 2 (two) times daily with a meal. 11/25/15    Jeanella Craze, NP  chlorhexidine gluconate (PERIDEX) 0.12 % solution 15 mLs by Mouth Rinse route 2 (two) times daily. 11/25/15   Jeanella Craze, NP  fentaNYL (SUBLIMAZE) 100 MCG/2ML injection Inject 1-4 mLs (50-200 mcg total) into the vein every 2 (two) hours as needed (breakthrough pain). 11/25/15   Jeanella Craze, NP  folic acid (FOLVITE) 1 MG tablet TAKE ONE TABLET BY MOUTH ONCE DAILY 09/27/15   Lennette Bihari, MD  hydrALAZINE (APRESOLINE) 20 MG/ML injection Inject 0.5 mLs (10 mg total) into the vein every 4 (four) hours as needed (FOR SYSTOLIC BLOOD PRESSURE MORE THAN 160). 11/25/15   Jeanella Craze, NP  hydrALAZINE (APRESOLINE) 25 MG tablet Take 1 tablet (25 mg total) by mouth every 8 (eight) hours. 11/25/15   Jeanella Craze, NP  insulin aspart (NOVOLOG) 100 UNIT/ML injection Correction coverage: Sensitive (thin, NPO, renal) CBG < 70: implement hypoglycemia protocol CBG 70 - 120: 0 units CBG 121 - 150: 1 unit CBG 151 - 200: 2 units CBG 201 - 250: 3 units CBG 251 - 300: 5 units CBG 301 - 350: 7 units CBG 351 - 400: 9 units CBG > 400: call MD and obtain STAT lab verification 11/25/15   Jeanella Craze, NP  isosorbide mononitrate (ISMO,MONOKET) 10 MG tablet Take 1.5 tablets (15 mg total) by mouth 2 (two) times daily at 10 AM and 5 PM. 11/25/15   Jeanella Craze, NP  methylPREDNISolone sodium succinate (SOLU-MEDROL) 40 mg/mL injection Inject 1 mL (40 mg total) into the vein daily. 11/26/15   Jeanella Craze, NP  midazolam (VERSED) 2 MG/2ML SOLN injection Inject 1-4 mLs (1-4 mg total) into the vein every 2 (two) hours as needed for agitation or sedation. 11/25/15   Jeanella Craze, NP  Nutritional Supplements (FEEDING SUPPLEMENT, NEPRO CARB STEADY,) LIQD Place 1,000 mLs into feeding tube daily. 11/25/15   Jeanella Craze, NP  nystatin (MYCOSTATIN) 100000 UNIT/ML suspension Take 5 mLs (500,000 Units total) by mouth 4 (four) times daily. 11/25/15   Jeanella Craze, NP  pantoprazole (PROTONIX) 40 MG injection Inject 40 mg  into the vein daily. 11/25/15   Jeanella Craze, NP  polysaccharide iron (NIFEREX) 150 MG CAPS capsule Take 150 mg by mouth 2 (two) times daily.     Historical Provider, MD  primidone (MYSOLINE) 50 MG tablet Take 1 tablet (50 mg total) by mouth at bedtime. 11/25/15   Jeanella Craze, NP  sodium chloride flush (NS) 0.9 % SOLN 10-40 mLs by Intracatheter route every 12 (twelve) hours. 11/25/15   Jeanella Craze, NP  sodium chloride flush (NS) 0.9 % SOLN 10-40 mLs by Intracatheter route as needed (flush). 11/25/15   Jeanella Craze, NP   BP 150/62 mmHg  Pulse 87  Temp(Src) 99.3 F (37.4 C) (Axillary)  Resp 24  Ht  (1.626 m)  SpO2 98% Physical Exam  Constitutional: She appears ill. No distress. She is sedated.  Trach, mechanically ventilated  HENT:  Head: Normocephalic and atraumatic.  Eyes: Conjunctivae are normal. Pupils are equal, round, and reactive to light.  Neck: No JVD present.  Trach clean, dry, looks well, no signs of infection.  Cardiovascular: Normal rate, regular rhythm and intact distal pulses.   Pulmonary/Chest: She has no wheezes. She has rales.  Mechanically ventilated  Abdominal: Soft. Bowel sounds are normal. She exhibits distension (ascites).  Musculoskeletal: She exhibits edema.  Neurological: She is alert.  Skin: Skin is warm and dry.  Nursing note and vitals reviewed.   ED Course  Procedures (including critical care time) Labs Review Labs Reviewed  CBC WITH DIFFERENTIAL/PLATELET - Abnormal; Notable for the following:    RBC 3.06 (*)    Hemoglobin 8.3 (*)    HCT 27.6 (*)    RDW 18.0 (*)    All other components within normal limits  COMPREHENSIVE METABOLIC PANEL - Abnormal; Notable for the following:    Potassium 3.4 (*)    Chloride 98 (*)    Glucose, Bld 196 (*)    BUN 70 (*)    Creatinine, Ser 1.04 (*)    Calcium 8.4 (*)    Total Protein 5.3 (*)    Albumin 1.9 (*)    AST 104 (*)    ALT 103 (*)    Alkaline Phosphatase 183 (*)    GFR calc non Af Amer 49  (*)    GFR calc Af Amer 56 (*)    All other components within normal limits  I-STAT TROPOININ, ED - Abnormal; Notable for the following:    Troponin i, poc 0.89 (*)    All other components within normal limits  I-STAT ARTERIAL BLOOD GAS, ED - Abnormal; Notable for the following:    pH, Arterial 7.527 (*)    pO2, Arterial 74.0 (*)    Bicarbonate 35.6 (*)    Acid-Base Excess 12.0 (*)    All other components within normal limits  CULTURE, BLOOD (ROUTINE X 2)  CULTURE, BLOOD (ROUTINE X 2)  LIPASE, BLOOD  BRAIN NATRIURETIC PEPTIDE  I-STAT CG4 LACTIC ACID, ED    Imaging Review Dg Chest Port 1 View  01/09/2016  CLINICAL DATA:  Chronic respiratory failure. Bilateral pleural effusions. Fever. EXAM: PORTABLE CHEST 1 VIEW COMPARISON:  12/19/2015, 12/16/2015, 12/12/2015 and 12/02/2015 FINDINGS: Endotracheal tube remains in good position. Central line has been removed since the prior study. There is new pulmonary vascular congestion with new bilateral slight perihilar edema as well as edema at the right lung base. Haziness at the right lung base has increased since the prior study. There is chronic cardiomegaly. CABG. Aortic atherosclerosis. Chronic haziness at the left base is stable IMPRESSION: Findings consistent with congestive heart failure with new pulmonary vascular congestion and bilateral pulmonary edema. Electronically Signed   By: Francene Boyers M.D.   On: 01/09/2016 14:25   I have personally reviewed and evaluated these images and lab results as part of my medical decision-making.   EKG Interpretation   Date/Time:  Monday January 09 2016 15:48:46 EDT Ventricular Rate:  90 PR Interval:  152 QRS Duration: 141 QT Interval:  383 QTC Calculation: 469 R Axis:   58 Text Interpretation:  Sinus rhythm Ventricular trigeminy LVH with  secondary repolarization abnormality Baseline wander in lead(s) V4  unchanged since previous  Confirmed by YAO  MD, DAVID ([id number]) on 01/09/2016  3:51:43 PM       MDM   Final diagnoses:  Chronic respiratory failure with hypercapnia (HCC)    Patient presents with concern for tachycardia and agitation. Considering her recent low-grade fever, and CT with evidence of atelectasis versus pneumonia, blood cultures obtained and antibiotics covering HCAP. Labs reviewed, patient does not have lactic acidosis, no elevated white blood cell count, and chest x-ray obtained here does not show any definitive infiltrate concerning for a large lobar pneumonia. She is not tachycardic now, and is on home vent settings oxygenating well, and appears comfortable. Her troponin is mildly elevated at 0.89, this likely represents demand ischemia in the setting of her tachycardia, with very overall poor functional status. After further discussions with the daughter, it seems that her primary concern is that she does not want the patient to go back to [facility]. She would rather the patient go back to our facility, however this is not possible, and I have spoken to social work about that.  It would be reasonable to discharge her back to [facility], but considering the equivocal CT scan findings, and history of increased work of breathing, believe would also be reasonable to cover her for pneumonia with antibiotics. I have obtained a formal consultation with critical care for recommendations, possibly admission versus discharge to [facility] with new plan of care.    Erskine Emery, MD 01/09/16 1614  Richardean Canal, MD 01/09/16 [phone]

## 2016-01-09 NOTE — ED Notes (Addendum)
Pt. From [facility] for long-term vent management. Patient has trach. Family noted patient had abdominal pain and 01/07/16 CT scan was completed showing gall stones, bilateral pleural effusions- poss. Pneumonia and possible pancreatis. Pt. Has PICC line. Family requesting transport to ED for further management.    Per [facility] nursing notes. Patient has increase confusion and is combative at times. Patient has advanced dementia. Patient vent setting increased today to pressure control-20, inspiratry press- 45, 28%, 5 PEEP. HR was noted to be 135 at facility and oral temperature of 100.8. Staff gave tylenol- cannot find amount or time in chart.

## 2016-01-09 NOTE — H&P (Addendum)
[facility] History and Physical  [name] GEX:[id number] DOB: 1930-12-15 DOA: 01/09/2016  Referring physician: ED physician PCP: Neena RhymesKatherine Tabori, MD  Specialists:   Chief Complaint: fever, increased trach secretions, worsening mental status, tachycardia, bilateral leg edema  HPI: Monique RistKhaldia A [name] is a 82 y.o. 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  [facility] on [facility] , and sent to [facility] from 3/3 to 4/11 . She is on PEG tube. Given patient's functional deficits, multiple comorbidities, high risk for decompensation, she was transfered to [facility]. Pt is non-verbal. Per family, pt has fever of 100.8 and increased trach secretions today. Patient has tachycardia with heart rate are up to 135. She has worsening bilateral leg edema. Her daughters do not think pt is getting [facility]. Per her daughters, patient does not seem to have nausea, vomiting, diarrhea or abdominal pain. Not sure whether patient has chest pain or symptoms of UTI. Pt moves all extremeities. Per daughters, pt had a CT abd done in [facility], which showed bilateral pleural effusions, consolidation in lower lobes, and abdominal and pelvic ascites, but was not started with abx since she did no have fever then. Of note, patient is currently being weaned off of the vent, and she was weaned on Friday for 3.5 hours.   In ED, patient was found to have lactate of 1.15, troponin 1.23, lipase 42, BNP 3385, WBC 7.3, oxygen saturation 95 200%, potassium 3.4, creatinine 1.04. Chest x-ray showed findings consistent with congestive heart failure with new pulmonary vascular  congestion and bilateral pulmonary edema. ABG shows pH of 7.52, pCO2 of 43, pO2 of 74, bicarb of 35. Pt is admitted to inpatient for further evaluation treatment. PCCM was consulted.  EKG: Independently reviewed. QTC 487, T-wave inversion in inferior leads, and ST depression in V3.  Where does patient live? [facility]  Can patient participate in ADLs? None  Review of Systems: Could not be reviewed due to nonverbal  Allergy:  Allergies  Allergen Reactions  . Penicillins Anaphylaxis, Itching and Swelling    Has patient had a PCN reaction causing immediate rash, facial/tongue/throat swelling, SOB or lightheadedness with hypotension: Yes Has patient had a PCN reaction causing severe rash involving mucus membranes or skin necrosis: No  Has patient had a PCN reaction that required hospitalization: Unknown Has patient had a PCN reaction occurring within the last 10 years: No  If all of the above answers are "NO", then may proceed with Cephalosporin use.     Past Medical History  Diagnosis Date  . Diabetes mellitus   . Hypertension   . Stroke [facility](HCC) Right Side Weakness  . CHF (congestive heart failure) (HCC)     2D ECHO, 09/13/2011 - EF 40-45%, normal  . 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 Pain in limb     BILATERAL EXTREMITY VENOUS DUPLEX, 01/08/2011 - no evidence of deep vein or superficial thrombosis or Baker's cyst  . Coronary artery disease   . Renal disorder   . Chronic kidney disease (CKD), stage III (moderate)   . Anemia   . Depression   .  Anxiety   . Respiratory failure (HCC)   . Cardiomyopathy (HCC)     EF 35%  . Morbid obesity [facility])     Past Surgical History  Procedure Laterality Date  . Cardiac surgery    . Cardiac catheterization  10/27/2004    3 stents placed, 3.5x28 proximally,3.5x33 mid segment, and 3.5x33  in the region of the crux, stenosis being reduced to 0% at proximal and mid segment and being reduced from 80% ot 10% in the stent at the cruxed segment  . Cardiac catheterization  09/26/2004    High grade 95% ostial stenosis in the RCA with 70% mid stenosis, 95% distal stenosis and total occlusion of the PDA with collaterals  . Coronary artery bypass graft  02/24/1999    x4; IMA to distal LAD, left radial to first circ, SVG to first diagonal, SVG to posterior descending coronary artery  . Tracheostomy tube placement N/A 11/24/2015    Procedure: TRACHEOSTOMY;  Surgeon: Osborn Coho, MD;  Location: [facility] OR;  Service: ENT;  Laterality: N/A;    Social History:  reports that she has never smoked. She has never used smokeless tobacco. She reports that she does not drink alcohol or use illicit drugs.  Family History:  Family History  Problem Relation Age of Onset  . Cancer Mother     Lung  . Heart disease Brother   . Hypertension Brother   . Heart disease Brother   . Hypertension Brother      Prior to Admission medications   Medication Sig Start Date End Date Taking? Authorizing Provider  acetaminophen (TYLENOL) 650 MG CR tablet 650 mg by PEG Tube route every 5 (five) hours as needed for fever.   Yes Historical Provider, MD  amLODipine (NORVASC) 5 MG tablet 5 mg by PEG Tube route daily.   Yes Historical Provider, MD  busPIRone (BUSPAR) 10 MG tablet 10 mg by PEG Tube route 3 (three) times daily.   Yes Historical Provider, MD  enoxaparin (LOVENOX) 40 MG/0.4ML injection Inject 40 mg into the skin daily.   Yes Historical Provider, MD  ferrous sulfate 300 (60 Fe) MG/5ML syrup Place 300 mg into feeding tube daily.   Yes Historical Provider, MD  folic acid (FOLVITE) 1 MG tablet 1 mg by PEG Tube route daily.   Yes Historical Provider, MD  furosemide (LASIX) 40 MG tablet 40 mg by PEG Tube route 2 (two) times daily.   Yes Historical Provider, MD  haloperidol (HALDOL) 0.5 MG tablet 0.5 mg by PEG Tube route  2 (two) times daily.   Yes Historical Provider, MD  insulin glargine (LANTUS) 100 UNIT/ML injection Inject 15 Units into the skin at bedtime.   Yes Historical Provider, MD  insulin lispro (HUMALOG) 100 UNIT/ML injection Inject 2-12 Units into the skin every 6 (six) hours. Per sliding scale: if 151-200=2 units, 201-250= 4 units, 251-300=6 units, 301-350= 8 units, 351-400=10 units >400 give 12 units and call physician subcutaneously every 6 hours for T2D   Yes Historical Provider, MD  ipratropium-albuterol (DUONEB) 0.5-2.5 (3) MG/3ML SOLN Take 3 mLs by nebulization every 6 (six) hours. For trach tube   Yes Historical Provider, MD  isosorbide mononitrate (IMDUR) 15 mg TB24 24 hr tablet 15 mg by PEG Tube route 2 (two) times daily.   Yes Historical Provider, MD  LORazepam (ATIVAN) 0.5 MG tablet Take 0.5 mg by mouth at bedtime.   Yes Historical Provider, MD  pantoprazole (PROTONIX) 40 MG tablet 40 mg by PEG Tube route daily.  Yes Historical Provider, MD  polyethylene glycol (MIRALAX / GLYCOLAX) packet 17 g by PEG Tube route daily.   Yes Historical Provider, MD  predniSONE (DELTASONE) 10 MG tablet 10 mg by PEG Tube route daily with breakfast.   Yes Historical Provider, MD  PRESCRIPTION MEDICATION Medication: Lidocaine Aerosol: 2cc inhale orally via nebulizer every 6 hours as needed for cough   Yes Historical Provider, MD  simethicone (MYLICON) 80 MG chewable tablet 80 mg by PEG Tube route 2 (two) times daily.   Yes Historical Provider, MD  albuterol (PROVENTIL) (2.5 MG/3ML) 0.083% nebulizer solution Take 3 mLs (2.5 mg total) by nebulization every 6 (six) hours. Patient not taking: Reported on 01/09/2016 11/25/15   Jeanella Craze, NP  carvedilol (COREG) 3.125 MG tablet Take 1 tablet (3.125 mg total) by mouth 2 (two) times daily with a meal. Patient not taking: Reported on 01/09/2016 11/25/15   Jeanella Craze, NP  chlorhexidine gluconate (PERIDEX) 0.12 % solution 15 mLs by Mouth Rinse route 2 (two) times  daily. Patient not taking: Reported on 01/09/2016 11/25/15   Jeanella Craze, NP  fentaNYL (SUBLIMAZE) 100 MCG/2ML injection Inject 1-4 mLs (50-200 mcg total) into the vein every 2 (two) hours as needed (breakthrough pain). Patient not taking: Reported on 01/09/2016 11/25/15   Jeanella Craze, NP  folic acid (FOLVITE) 1 MG tablet TAKE ONE TABLET BY MOUTH ONCE DAILY Patient not taking: Reported on 01/09/2016 09/27/15   Lennette Bihari, MD  hydrALAZINE (APRESOLINE) 20 MG/ML injection Inject 0.5 mLs (10 mg total) into the vein every 4 (four) hours as needed (FOR SYSTOLIC BLOOD PRESSURE MORE THAN 160). Patient not taking: Reported on 01/09/2016 11/25/15   Jeanella Craze, NP  hydrALAZINE (APRESOLINE) 25 MG tablet Take 1 tablet (25 mg total) by mouth every 8 (eight) hours. Patient not taking: Reported on 01/09/2016 11/25/15   Jeanella Craze, NP  insulin aspart (NOVOLOG) 100 UNIT/ML injection Correction coverage: Sensitive (thin, NPO, renal) CBG < 70: implement hypoglycemia protocol CBG 70 - 120: 0 units CBG 121 - 150: 1 unit CBG 151 - 200: 2 units CBG 201 - 250: 3 units CBG 251 - 300: 5 units CBG 301 - 350: 7 units CBG 351 - 400: 9 units CBG > 400: call MD and obtain STAT lab verification Patient not taking: Reported on 01/09/2016 11/25/15   Jeanella Craze, NP  isosorbide mononitrate (ISMO,MONOKET) 10 MG tablet Take 1.5 tablets (15 mg total) by mouth 2 (two) times daily at 10 AM and 5 PM. Patient not taking: Reported on 01/09/2016 11/25/15   Jeanella Craze, NP  methylPREDNISolone sodium succinate (SOLU-MEDROL) 40 mg/mL injection Inject 1 mL (40 mg total) into the vein daily. Patient not taking: Reported on 01/09/2016 11/26/15   Jeanella Craze, NP  midazolam (VERSED) 2 MG/2ML SOLN injection Inject 1-4 mLs (1-4 mg total) into the vein every 2 (two) hours as needed for agitation or sedation. Patient not taking: Reported on 01/09/2016 11/25/15   Jeanella Craze, NP  Nutritional Supplements (FEEDING SUPPLEMENT, NEPRO CARB STEADY,)  LIQD Place 1,000 mLs into feeding tube daily. Patient not taking: Reported on 01/09/2016 11/25/15   Jeanella Craze, NP  nystatin (MYCOSTATIN) 100000 UNIT/ML suspension Take 5 mLs (500,000 Units total) by mouth 4 (four) times daily. Patient not taking: Reported on 01/09/2016 11/25/15   Jeanella Craze, NP  pantoprazole (PROTONIX) 40 MG injection Inject 40 mg into the vein daily. Patient not taking: Reported on 01/09/2016 11/25/15  Jeanella Craze, NP  primidone (MYSOLINE) 50 MG tablet Take 1 tablet (50 mg total) by mouth at bedtime. Patient not taking: Reported on 01/09/2016 11/25/15   Jeanella Craze, NP  sodium chloride flush (NS) 0.9 % SOLN 10-40 mLs by Intracatheter route every 12 (twelve) hours. Patient not taking: Reported on 01/09/2016 11/25/15   Jeanella Craze, NP  sodium chloride flush (NS) 0.9 % SOLN 10-40 mLs by Intracatheter route as needed (flush). Patient not taking: Reported on 01/09/2016 11/25/15   Jeanella Craze, NP    Physical Exam: Filed Vitals:   01/09/16 2330 01/09/16 2356 01/10/16 0000 01/10/16 0022  BP: 155/78  168/110   Pulse: 99  109   Temp:      TempSrc:      Resp: 23  34   Height:      SpO2: 99% 99% 100% 100%   General: Not in acute distress HEENT:       Eyes: PERRL, EOMI, no scleral icterus.       ENT: No discharge from the ears and nose, no pharynx injection, no tonsillar enlargement.        Neck: Difficult to assess JVD due to obesity, no bruit, no mass felt. Heme: No neck lymph node enlargement. Cardiac: S1/S2, RRR, No murmurs, No gallops or rubs. Pulm: No rales, wheezing, rhonchi or rubs. Has trach. Abd: Soft, nondistended, nontender, no rebound pain, no organomegaly, BS present. Has PEG tube in place. Ext: 3+ pitting leg edema bilaterally. 2+DP/PT pulse bilaterally. Musculoskeletal: No joint deformities, No joint redness or warmth, no limitation of ROM in spin. Skin: No rashes.  Neuro: non-verbal, cranial nerves II-XII grossly intact, moves all extremities. Psych:  Could not be assessed due to nonverbal  Labs on Admission:  Basic Metabolic Panel:  Recent Labs Lab 01/04/16 0816 01/09/16 1434  NA  --  140  K 4.1 3.4*  CL  --  98*  CO2  --  31  GLUCOSE  --  196*  BUN  --  70*  CREATININE  --  1.04*  CALCIUM  --  8.4*   Liver Function Tests:  Recent Labs Lab 01/09/16 1434  AST 104*  ALT 103*  ALKPHOS 183*  BILITOT 0.5  PROT 5.3*  ALBUMIN 1.9*    Recent Labs Lab 01/09/16 1434  LIPASE 42   No results for input(s): AMMONIA in the last 168 hours. CBC:  Recent Labs Lab 01/09/16 1433  WBC 7.3  NEUTROABS 5.6  HGB 8.3*  HCT 27.6*  MCV 90.2  PLT 150   Cardiac Enzymes:  Recent Labs Lab 01/09/16 2227  TROPONINI 1.23*    BNP (last 3 results)  Recent Labs  11/08/15 0220 12/18/15 0746 01/09/16 1552  BNP 2328.7* 2114.3* 3385.1*    ProBNP (last 3 results) No results for input(s): PROBNP in the last 8760 hours.  CBG: No results for input(s): GLUCAP in the last 168 hours.  Radiological Exams on Admission: Dg Chest Port 1 View  01/09/2016  CLINICAL DATA:  Chronic respiratory failure. Bilateral pleural effusions. Fever. EXAM: PORTABLE CHEST 1 VIEW COMPARISON:  12/19/2015, 12/16/2015, 12/12/2015 and 12/02/2015 FINDINGS: Endotracheal tube remains in good position. Central line has been removed since the prior study. There is new pulmonary vascular congestion with new bilateral slight perihilar edema as well as edema at the right lung base. Haziness at the right lung base has increased since the prior study. There is chronic cardiomegaly. CABG. Aortic atherosclerosis. Chronic haziness at the left base is stable  IMPRESSION: Findings consistent with congestive heart failure with new pulmonary vascular congestion and bilateral pulmonary edema. Electronically Signed   By: Francene Boyers M.D.   On: 01/09/2016 14:25    Assessment/Plan Principal Problem:   Acute on chronic systolic (congestive) heart failure (HCC) Active  Problems:   Pulmonary edema   GERD (gastroesophageal reflux disease)   Iron deficiency anemia   Hyperlipidemia   Essential hypertension   HCAP (healthcare-associated pneumonia)   Obesity (BMI 30-39.9)   Hx of CABG-'00   Acute encephalopathy   Type 2 diabetes mellitus with vascular disease (HCC)   PEG (percutaneous endoscopic gastrostomy) adjustment/replacement/removal (HCC)   Respiratory failure (HCC)   CHF exacerbation (HCC)   Elevated troponin   Acute on chronic systolic (congestive) heart failure [facility]): Patient has an elevated BNP of 3385, 3+ bilateral leg edema, plus chest x-ray findings of bilateral pulmonary edema, consistent with CHF exacerbation.  -will admit to SDU -Lasix 60 mg bid by IV -trop x 3 -2d echo -Coreg and ASA -Daily weights -strict I/O's -Low salt diet when able to eat  Elevated trop and hx of CAD: s/p of CABG: likely due to demanding ischemia 2/2 CHF exacerbation. Trop 1.23 - will admit to Tele bed  - cycle CE q6 x3 and repeat her EKG in the am  - aspirin, lipitor, coreg, Imdur - Risk factor stratification: will check FLP and A1C  - 2d echo - please call Card in AM  Possible HCAP: pt has fever of 100.8, increased trach secretions. Her recent CT-abd/pelvis showed bilateral pleural effusions and consolidation in lower lobes, indicating possible HCAP.  PCCM was consulted, started pt with vancomycin, aztreonam and Levaquin. - will continue IV abx - DuoNeb, albuterol Neb prn for SOB - Urine legionella and S. pneumococcal antigen - Follow up blood culture x2, sputum culture and respiratory virus panel, plus Flu pcr  Hypokalemia: K= 3.4 on admission. - Repleted - Check Mg level  GERD: -Protonix  HTN: -on lasix IV -Amlodipine, Coreg  DM-II: Last A1c 5.6 on 09/01/15, well controled. Patient is taking Lantus and Humalog at home -will decrease Lantus dose from 15-10 units daily  -SSI  Depression and anxiety:  -Continue home medications: BuSpar and  ativan   DVT ppx: SQ Lovenox  Code Status: Full code Family Communication: Yes, patient's two daughters at bed side Disposition Plan: Admit to inpatient   Date of Service 01/10/2016    Lorretta Harp [facility] Pager [phone]  If 7PM-7AM, please contact night-coverage [email or web address] Password [email or web address] 01/10/2016, 2:09 AM

## 2016-01-09 NOTE — ED Notes (Signed)
Patient has PICC line placement. Brooke RN attempted to draw back and flush IV without success. IV team consult placed.

## 2016-01-09 NOTE — Telephone Encounter (Signed)
[name] is calling in to speak with Burna MortimerWanda to see if the CMN form was received and signed by Dr. Tresa EndoKelly. She says that it was faxed over on 4/7. If you could please fax this form back as soon as possible at [phone].   Thanks

## 2016-01-10 DIAGNOSIS — I10 Essential (primary) hypertension: Secondary | ICD-10-CM

## 2016-01-10 DIAGNOSIS — D509 Iron deficiency anemia, unspecified: Secondary | ICD-10-CM

## 2016-01-10 DIAGNOSIS — Z431 Encounter for attention to gastrostomy: Secondary | ICD-10-CM

## 2016-01-10 DIAGNOSIS — J9612 Chronic respiratory failure with hypercapnia: Secondary | ICD-10-CM | POA: Insufficient documentation

## 2016-01-10 DIAGNOSIS — R778 Other specified abnormalities of plasma proteins: Secondary | ICD-10-CM | POA: Diagnosis present

## 2016-01-10 DIAGNOSIS — E785 Hyperlipidemia, unspecified: Secondary | ICD-10-CM

## 2016-01-10 DIAGNOSIS — R7989 Other specified abnormal findings of blood chemistry: Secondary | ICD-10-CM | POA: Diagnosis present

## 2016-01-10 DIAGNOSIS — Z951 Presence of aortocoronary bypass graft: Secondary | ICD-10-CM

## 2016-01-10 DIAGNOSIS — G9340 Encephalopathy, unspecified: Secondary | ICD-10-CM

## 2016-01-10 DIAGNOSIS — Z930 Tracheostomy status: Secondary | ICD-10-CM | POA: Insufficient documentation

## 2016-01-10 DIAGNOSIS — E669 Obesity, unspecified: Secondary | ICD-10-CM

## 2016-01-10 LAB — INFLUENZA PANEL BY PCR (TYPE A & B)
H1N1 flu by pcr: NOT DETECTED
INFLAPCR: NEGATIVE
Influenza B By PCR: NEGATIVE

## 2016-01-10 LAB — LIPID PANEL
CHOLESTEROL: 213 mg/dL — AB (ref 0–200)
HDL: 51 mg/dL (ref >40)
LDL Cholesterol: 129 mg/dL — ABNORMAL HIGH (ref 0–99)
Total CHOL/HDL Ratio: 4.2 RATIO
Triglycerides: 166 mg/dL — ABNORMAL HIGH (ref <150)
VLDL: 33 mg/dL (ref 0–40)

## 2016-01-10 LAB — MAGNESIUM
MAGNESIUM: 2 mg/dL (ref 1.7–2.4)
Magnesium: 2 mg/dL (ref 1.7–2.4)

## 2016-01-10 LAB — CBG MONITORING, ED: Glucose-Capillary: 94 mg/dL (ref 65–99)

## 2016-01-10 LAB — CBC
HEMATOCRIT: 31.9 % — AB (ref 36.0–46.0)
HEMOGLOBIN: 9.5 g/dL — AB (ref 12.0–15.0)
MCH: 27 pg (ref 26.0–34.0)
MCHC: 29.8 g/dL — ABNORMAL LOW (ref 30.0–36.0)
MCV: 90.6 fL (ref 78.0–100.0)
Platelets: 172 10*3/uL (ref 150–400)
RBC: 3.52 MIL/uL — AB (ref 3.87–5.11)
RDW: 18.4 % — ABNORMAL HIGH (ref 11.5–15.5)
WBC: 7.5 10*3/uL (ref 4.0–10.5)

## 2016-01-10 LAB — BASIC METABOLIC PANEL
Anion gap: 14 (ref 5–15)
BUN: 64 mg/dL — AB (ref 6–20)
CALCIUM: 8.5 mg/dL — AB (ref 8.9–10.3)
CO2: 29 mmol/L (ref 22–32)
Chloride: 99 mmol/L — ABNORMAL LOW (ref 101–111)
Creatinine, Ser: 1.05 mg/dL — ABNORMAL HIGH (ref 0.44–1.00)
GFR calc Af Amer: 56 mL/min — ABNORMAL LOW (ref >60)
GFR, EST NON AFRICAN AMERICAN: 48 mL/min — AB (ref >60)
GLUCOSE: 86 mg/dL (ref 65–99)
POTASSIUM: 4.5 mmol/L (ref 3.5–5.1)
Sodium: 142 mmol/L (ref 135–145)

## 2016-01-10 LAB — TROPONIN I
TROPONIN I: 1.06 ng/mL — AB (ref <0.031)
TROPONIN I: 0.83 ng/mL — AB (ref <0.031)
TROPONIN I: 0.71 ng/mL — AB (ref <0.031)

## 2016-01-10 LAB — MRSA PCR SCREENING: MRSA by PCR: POSITIVE — AB

## 2016-01-10 LAB — GLUCOSE, CAPILLARY
GLUCOSE-CAPILLARY: 153 mg/dL — AB (ref 65–99)
GLUCOSE-CAPILLARY: 126 mg/dL — AB (ref 65–99)

## 2016-01-10 LAB — STREP PNEUMONIAE URINARY ANTIGEN: Strep Pneumo Urinary Antigen: NEGATIVE

## 2016-01-10 LAB — PROTIME-INR
INR: 1.14 (ref 0.00–1.49)
Prothrombin Time: 14.8 seconds (ref 11.6–15.2)

## 2016-01-10 LAB — APTT: aPTT: 38 seconds — ABNORMAL HIGH (ref 24–37)

## 2016-01-10 MED ORDER — CHLORHEXIDINE GLUCONATE CLOTH 2 % EX PADS
6.0000 | MEDICATED_PAD | Freq: Every day | CUTANEOUS | Status: DC
  Administered 2016-01-11 – 2016-01-13 (×3): 6 via TOPICAL

## 2016-01-10 MED ORDER — FERROUS SULFATE 300 (60 FE) MG/5ML PO SYRP
300.0000 mg | ORAL_SOLUTION | Freq: Every day | ORAL | Status: DC
  Administered 2016-01-10 – 2016-01-13 (×4): 300 mg
  Filled 2016-01-10 (×4): qty 5

## 2016-01-10 MED ORDER — HYDRALAZINE HCL 50 MG PO TABS
50.0000 mg | ORAL_TABLET | Freq: Three times a day (TID) | ORAL | Status: DC
  Administered 2016-01-10 – 2016-01-13 (×7): 50 mg
  Filled 2016-01-10 (×9): qty 1

## 2016-01-10 MED ORDER — PREDNISONE 10 MG PO TABS
10.0000 mg | ORAL_TABLET | Freq: Every day | ORAL | Status: DC
  Administered 2016-01-10 – 2016-01-12 (×3): 10 mg
  Filled 2016-01-10 (×4): qty 1

## 2016-01-10 MED ORDER — FUROSEMIDE 10 MG/ML IJ SOLN
60.0000 mg | Freq: Two times a day (BID) | INTRAMUSCULAR | Status: DC
  Administered 2016-01-10 – 2016-01-12 (×4): 60 mg via INTRAVENOUS
  Filled 2016-01-10 (×5): qty 6

## 2016-01-10 MED ORDER — SODIUM CHLORIDE 0.9% FLUSH: 3.0000 mL | INTRAVENOUS | Status: DC | PRN

## 2016-01-10 MED ORDER — VITAL HIGH PROTEIN PO LIQD
1000.0000 mL | ORAL | Status: DC
  Administered 2016-01-10 – 2016-01-12 (×3): 1000 mL
  Filled 2016-01-10 (×4): qty 1000

## 2016-01-10 MED ORDER — SODIUM CHLORIDE 0.9 % IV SOLN
250.0000 mL | INTRAVENOUS | Status: DC | PRN
  Administered 2016-01-12: 250 mL via INTRAVENOUS

## 2016-01-10 MED ORDER — ASPIRIN 300 MG RE SUPP: 150.0000 mg | Freq: Every day | RECTAL | Status: DC

## 2016-01-10 MED ORDER — ASPIRIN 81 MG PO CHEW
162.0000 mg | CHEWABLE_TABLET | Freq: Every day | ORAL | Status: DC
  Administered 2016-01-10 – 2016-01-13 (×4): 162 mg
  Filled 2016-01-10 (×4): qty 2

## 2016-01-10 MED ORDER — SIMETHICONE 80 MG PO CHEW
80.0000 mg | CHEWABLE_TABLET | Freq: Two times a day (BID) | ORAL | Status: DC
  Administered 2016-01-10 – 2016-01-12 (×5): 80 mg
  Filled 2016-01-10 (×8): qty 1

## 2016-01-10 MED ORDER — BUSPIRONE HCL 5 MG PO TABS
10.0000 mg | ORAL_TABLET | Freq: Three times a day (TID) | ORAL | Status: DC
  Administered 2016-01-10 – 2016-01-13 (×9): 10 mg
  Filled 2016-01-10 (×6): qty 2
  Filled 2016-01-10: qty 1
  Filled 2016-01-10 (×3): qty 2

## 2016-01-10 MED ORDER — POLYETHYLENE GLYCOL 3350 17 G PO PACK
17.0000 g | PACK | Freq: Every day | ORAL | Status: DC
  Administered 2016-01-10 – 2016-01-13 (×3): 17 g
  Filled 2016-01-10 (×4): qty 1

## 2016-01-10 MED ORDER — ONDANSETRON HCL 4 MG/2ML IJ SOLN: 4.0000 mg | Freq: Four times a day (QID) | INTRAMUSCULAR | Status: DC | PRN

## 2016-01-10 MED ORDER — CARVEDILOL 6.25 MG PO TABS
6.2500 mg | ORAL_TABLET | Freq: Two times a day (BID) | ORAL | Status: DC
  Administered 2016-01-12 – 2016-01-13 (×3): 6.25 mg
  Filled 2016-01-10 (×6): qty 1

## 2016-01-10 MED ORDER — HALOPERIDOL 0.5 MG PO TABS
0.5000 mg | ORAL_TABLET | Freq: Two times a day (BID) | ORAL | Status: DC
  Administered 2016-01-10 – 2016-01-13 (×8): 0.5 mg
  Filled 2016-01-10 (×8): qty 1

## 2016-01-10 MED ORDER — ATORVASTATIN CALCIUM 40 MG PO TABS
40.0000 mg | ORAL_TABLET | Freq: Every day | ORAL | Status: DC
  Administered 2016-01-10 – 2016-01-12 (×4): 40 mg
  Filled 2016-01-10 (×4): qty 1

## 2016-01-10 MED ORDER — ENOXAPARIN SODIUM 40 MG/0.4ML ~~LOC~~ SOLN
40.0000 mg | SUBCUTANEOUS | Status: DC
  Administered 2016-01-10 – 2016-01-13 (×4): 40 mg via SUBCUTANEOUS
  Filled 2016-01-10 (×4): qty 0.4

## 2016-01-10 MED ORDER — FOLIC ACID 1 MG PO TABS
1.0000 mg | ORAL_TABLET | Freq: Every day | ORAL | Status: DC
  Administered 2016-01-10 – 2016-01-13 (×4): 1 mg
  Filled 2016-01-10 (×4): qty 1

## 2016-01-10 MED ORDER — INSULIN ASPART 100 UNIT/ML ~~LOC~~ SOLN
0.0000 [IU] | Freq: Three times a day (TID) | SUBCUTANEOUS | Status: DC
  Administered 2016-01-10 – 2016-01-11 (×2): 2 [IU] via SUBCUTANEOUS
  Administered 2016-01-11: 3 [IU] via SUBCUTANEOUS
  Administered 2016-01-11: 1 [IU] via SUBCUTANEOUS
  Administered 2016-01-12: 3 [IU] via SUBCUTANEOUS
  Administered 2016-01-12: 1 [IU] via SUBCUTANEOUS
  Administered 2016-01-12 – 2016-01-13 (×3): 2 [IU] via SUBCUTANEOUS

## 2016-01-10 MED ORDER — VANCOMYCIN HCL IN DEXTROSE 750-5 MG/150ML-% IV SOLN
750.0000 mg | Freq: Two times a day (BID) | INTRAVENOUS | Status: DC
  Administered 2016-01-10: 750 mg via INTRAVENOUS
  Filled 2016-01-10 (×2): qty 150

## 2016-01-10 MED ORDER — LORAZEPAM 0.5 MG PO TABS
0.5000 mg | ORAL_TABLET | Freq: Every day | ORAL | Status: DC
  Administered 2016-01-10 – 2016-01-12 (×3): 0.5 mg via ORAL
  Filled 2016-01-10 (×3): qty 1

## 2016-01-10 MED ORDER — LEVOFLOXACIN IN D5W 750 MG/150ML IV SOLN: 750.0000 mg | INTRAVENOUS | Status: DC

## 2016-01-10 MED ORDER — LORAZEPAM 2 MG/ML IJ SOLN: 2.0000 mg | Freq: Once | INTRAMUSCULAR | Status: DC

## 2016-01-10 MED ORDER — AMLODIPINE BESYLATE 5 MG PO TABS
5.0000 mg | ORAL_TABLET | Freq: Every day | ORAL | Status: DC
  Administered 2016-01-11: 5 mg
  Filled 2016-01-10: qty 1

## 2016-01-10 MED ORDER — INSULIN GLARGINE 100 UNIT/ML ~~LOC~~ SOLN
10.0000 [IU] | Freq: Every day | SUBCUTANEOUS | Status: DC
  Administered 2016-01-10 – 2016-01-12 (×3): 10 [IU] via SUBCUTANEOUS
  Filled 2016-01-10 (×4): qty 0.1

## 2016-01-10 MED ORDER — ALBUTEROL SULFATE (2.5 MG/3ML) 0.083% IN NEBU: 2.5000 mg | INHALATION_SOLUTION | RESPIRATORY_TRACT | Status: DC | PRN

## 2016-01-10 MED ORDER — VANCOMYCIN HCL IN DEXTROSE 750-5 MG/150ML-% IV SOLN
750.0000 mg | Freq: Two times a day (BID) | INTRAVENOUS | Status: DC
  Administered 2016-01-11 – 2016-01-12 (×4): 750 mg via INTRAVENOUS
  Filled 2016-01-10 (×5): qty 150

## 2016-01-10 MED ORDER — PANTOPRAZOLE SODIUM 40 MG PO TBEC
40.0000 mg | DELAYED_RELEASE_TABLET | Freq: Every day | ORAL | Status: DC
  Administered 2016-01-10 – 2016-01-12 (×3): 40 mg via ORAL
  Filled 2016-01-10 (×5): qty 1

## 2016-01-10 MED ORDER — SODIUM CHLORIDE 0.9% FLUSH
3.0000 mL | Freq: Two times a day (BID) | INTRAVENOUS | Status: DC
  Administered 2016-01-10 – 2016-01-12 (×3): 3 mL via INTRAVENOUS

## 2016-01-10 MED ORDER — IPRATROPIUM-ALBUTEROL 0.5-2.5 (3) MG/3ML IN SOLN
3.0000 mL | Freq: Four times a day (QID) | RESPIRATORY_TRACT | Status: DC
  Administered 2016-01-10: 3 mL via RESPIRATORY_TRACT
  Filled 2016-01-10: qty 3

## 2016-01-10 MED ORDER — ACETAMINOPHEN 325 MG PO TABS
650.0000 mg | ORAL_TABLET | ORAL | Status: DC | PRN
  Administered 2016-01-10 – 2016-01-12 (×3): 650 mg via ORAL
  Filled 2016-01-10 (×4): qty 2

## 2016-01-10 MED ORDER — MUPIROCIN 2 % EX OINT
1.0000 "application " | TOPICAL_OINTMENT | Freq: Two times a day (BID) | CUTANEOUS | Status: DC
  Administered 2016-01-11 – 2016-01-13 (×6): 1 via NASAL
  Filled 2016-01-10 (×2): qty 22

## 2016-01-10 MED ORDER — DEXTROSE 5 % IV SOLN
2.0000 g | Freq: Three times a day (TID) | INTRAVENOUS | Status: DC
  Administered 2016-01-10 – 2016-01-12 (×7): 2 g via INTRAVENOUS
  Filled 2016-01-10 (×11): qty 2

## 2016-01-10 MED ORDER — ISOSORBIDE MONONITRATE ER 30 MG PO TB24
15.0000 mg | ORAL_TABLET | Freq: Two times a day (BID) | ORAL | Status: DC
  Administered 2016-01-10 – 2016-01-12 (×6): 15 mg via ORAL
  Filled 2016-01-10 (×8): qty 1

## 2016-01-10 MED ORDER — LEVOFLOXACIN IN D5W 750 MG/150ML IV SOLN
750.0000 mg | INTRAVENOUS | Status: DC
  Administered 2016-01-10 – 2016-01-11 (×2): 750 mg via INTRAVENOUS
  Filled 2016-01-10 (×2): qty 150

## 2016-01-10 MED ORDER — POTASSIUM CHLORIDE 20 MEQ/15ML (10%) PO SOLN: 20.0000 meq | Freq: Once | ORAL | Status: DC

## 2016-01-10 MED ORDER — IPRATROPIUM-ALBUTEROL 0.5-2.5 (3) MG/3ML IN SOLN: 3.0000 mL | Freq: Four times a day (QID) | RESPIRATORY_TRACT | Status: DC | PRN

## 2016-01-10 NOTE — Progress Notes (Signed)
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   PCCM is consulted for vent management. She is being admitted by IM.   VITAL SIGNS: BP 95/45 mmHg  Pulse 85  Temp(Src) 99.1 F (37.3 C) (Axillary)  Resp 18  Ht 5\' 4"  (1.626 m)  SpO2 96%  HEMODYNAMICS:    VENTILATOR SETTINGS: Vent Mode:  [-] PRVC FiO2 (%):  [30 %] 30 % Set Rate:  [18 bmp] 18 bmp Vt Set:  [380 mL] 380 mL PEEP:  [5 cmH20] 5 cmH20 Plateau Pressure:  [21 cmH20-29 cmH20] 23 cmH20  INTAKE / OUTPUT: I/O last 3 completed shifts: In: 500 [I.V.:500] Out: -   PHYSICAL EXAMINATION: General: no distress on full vent support  Neuro:  Alert, on vent, PERRLA HEENT:  NCAT, mmm, no pharyngeal erythema Cardiovascular: tachycardic to 100s Lungs:  Diffuse crackles  Abdomen:  Obese, +BS Extremities: 3+ pitting edema to the upper thigh, right inner knee has post-surgical scar  LABS:  BMET  Recent Labs Lab 01/04/16 0816 01/09/16 1434  NA  --  140  K 4.1 3.4*  CL  --  98*  CO2  --  31  BUN  --  70*  CREATININE  --  1.04*  GLUCOSE  --  196*    Electrolytes  Recent Labs Lab 01/09/16 1434 01/10/16 0213  CALCIUM 8.4*  --   MG  --  2.0    CBC  Recent Labs Lab 01/09/16 1433  WBC 7.3  HGB 8.3*  HCT 27.6*  PLT 150    Coag's  Recent Labs Lab 01/10/16 0213  APTT 38*  INR 1.14    Sepsis Markers  Recent Labs Lab 01/09/16 1434  LATICACIDVEN 1.15    ABG  Recent Labs Lab 01/09/16 1405  PHART 7.527*  PCO2ART 43.0  PO2ART 74.0*    Liver Enzymes  Recent Labs Lab 01/09/16 1434  AST 104*  ALT 103*  ALKPHOS 183*   BILITOT 0.5  ALBUMIN 1.9*    Cardiac Enzymes  Recent Labs Lab 01/09/16 2227 01/10/16 0213  TROPONINI 1.23* 1.06*    Glucose  Recent Labs Lab 01/10/16 0804  GLUCAP 94    Imaging Dg Chest Port 1 View  01/09/2016  CLINICAL DATA:  Chronic respiratory failure. Bilateral pleural effusions. Fever. EXAM: PORTABLE CHEST 1 VIEW COMPARISON:  12/19/2015, 12/16/2015, 12/12/2015 and 12/02/2015 FINDINGS: Endotracheal tube remains in good position. Central line has been removed since the prior study. There is new pulmonary vascular congestion with new bilateral slight perihilar edema as well as edema at the right lung base. Haziness at the right lung base has increased since the prior study. There is chronic cardiomegaly. CABG. Aortic atherosclerosis. Chronic haziness at the left base is stable IMPRESSION: Findings consistent with congestive heart failure with new pulmonary vascular congestion and bilateral pulmonary edema. Electronically Signed   By: Francene BoyersJames  Maxwell M.D.   On: 01/09/2016 14:25   Pulmonary edema/ support tubes in satisfactory position  STUDIES:  CT abd pelvis 4/15 at [facility]> CXR 4/17 >  CULTURES: Bcr 4/17 >  ANTIBIOTICS: Aztreonam 4/17> Levofloxacin 4/17> Vanc 4/17>   SIGNIFICANT EVENTS:  LINES/TUBES:   82 year old female well known to PCCM service who has a chronic trach s/p CVA. Patient is morbidly obese and is vent dependent in vent SNF. She has what looks like a significant underlying metabolic alkalosis. Suspect that this is from a mix of diuretics as well as chronic respiratory acidosis during weaning efforts. Doubt she is a candidate for anything more than short intervals off mechanical ventilation at best; and more likely not weanable at all.    Chronic respiratory failure, vent/trach dependent in setting of OHS/OSA, severe deconditioning, malnutrition and h/o CVA: - Continue current vent settings. - Hold weaning for now. If we  are to re-attempt weaning our expectations should be w/ goal to get off vent for only short day-time efforts and MANDATORY nocturnal ventilation - CXR intermittently. - Titrate O2 for sat of 88-92%.  Acute pulmonary edema: due to CHF. - Diureses as BP allows.  - F/U imaging PRN.  Acute on chronic CHF Baseline systolic CM w: EF 35-40% - Diureses. - Lipitor.  Positive troponins: - per cards    Simonne Martinet ACNP-BC [facility] Pulmonary/Critical Care Pager # [phone] OR # [phone] if no answer 01/10/2016, 8:31 AM

## 2016-01-10 NOTE — Consult Note (Signed)
CARDIOLOGY CONSULT NOTE   Patient ID: [name] MRN: [id number], DOB/AGE: 80/03/1931   Admit date: 01/09/2016 Date of Consult: 01/10/2016   Primary Physician: [name], MD Primary Cardiologist: Dr. Tresa Endo  Pt. Profile  82 year old female with PMH of iron deficiency anemia requiring multiple transfusion, chronic combined systolic and diastolic heart failure, stage III CKD, chronic respiratory failure recently underwent Trac and PEG on Vent since 11/24/2015 and CAD s/p CABG 02/1999 with LIMA to LAD, left radial to OM, SVG to PDA, and SVG to diagonal presented from [facility] with Increased work of breathing, significant lower extremity edema.  Problem List  Past Medical History  Diagnosis Date  . Diabetes mellitus   . Hypertension   . Stroke [facility]) Right Side Weakness  . CHF (congestive heart failure) (HCC)     2D ECHO, 09/13/2011 - EF 40-45%, normal  . 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 Pain in limb     BILATERAL EXTREMITY VENOUS DUPLEX, 01/08/2011 - no evidence of deep vein or superficial thrombosis or Baker's cyst  . Coronary artery disease   . Renal disorder   . Chronic kidney disease (CKD), stage III (moderate)   . Anemia   . Depression   . Anxiety   . Respiratory failure (HCC)   . Cardiomyopathy (HCC)     EF 35%  . Morbid obesity [facility])     Past Surgical History  Procedure Laterality Date  . Cardiac surgery    . Cardiac catheterization  10/27/2004    3 stents placed, 3.5x28 proximally,3.5x33 mid segment, and 3.5x33 in the region of the crux, stenosis being reduced to 0% at proximal and mid segment and being reduced from 80% ot 10% in the stent at the cruxed segment  . Cardiac catheterization  09/26/2004    High grade 95% ostial stenosis in the RCA with 70% mid stenosis, 95% distal stenosis and total occlusion  of the PDA with collaterals  . Coronary artery bypass graft  02/24/1999    x4; IMA to distal LAD, left radial to first circ, SVG to first diagonal, SVG to posterior descending coronary artery  . Tracheostomy tube placement N/A 11/24/2015    Procedure: TRACHEOSTOMY;  Surgeon: Osborn Coho, MD;  Location[facility] OR;  Service: ENT;  Laterality: N/A;     Allergies  Allergies  Allergen Reactions  . Penicillins Anaphylaxis, Itching and Swelling    Has patient had a PCN reaction causing immediate rash, facial/tongue/throat swelling, SOB or lightheadedness with hypotension: Yes Has patient had a PCN reaction causing severe rash involving mucus membranes or skin necrosis: No  Has patient had a PCN reaction that required hospitalization: Unknown Has patient had a PCN reaction occurring within the last 10 years: No  If all of the above answers are "NO", then may proceed with Cephalosporin use.     HPI   [name] a 82 year old female with PMH of iron deficiency anemia requiring multiple transfusion, chronic combined systolic and diastolic heart failure, stage III CKD, chronic respiratory failure recently underwent Trac and PEG on Vent since 11/24/2015 and CAD s/p CABG 02/1999 with LIMA to LAD, left radial to OM, SVG to PDA, and SVG to diagonal. Her last cardiac catheterization was in January 2006, which resulted in a CVA post cath. PCI was delayed until 10/26/2004 at the recommendation of neurology/stroke team, and eventually  underwent Cypher stenting of SVG to PDA in 10/26/2004. Her last Myoview in November 2013 showed normal perfusion. Echocardiogram obtained in December 2016 showed EF 50%, grade 1 diastolic dysfunction, basal inferior and basal inferolateral hypokinesis, septal bounce consistent with LBBB, moderate MR. She was last seen in office on 11/01/2015, she was on torsemide 40 mg daily.   She had a prolonged hospitalization from 2/13 - 11/25/2015 with altered mental status and hypercapnic respiratory  failure after treated at [facility] on 5 days prior to the admission for the urinary tract infection and received Levaquin. She had worsening right-sided facial droop and weakness, MRI and CTA both showed old stroke and age related changes but no acute abnormality. She was initially was on BiPAP and ultimately underwent intubation on 2/14. Heart failure was consulted during that admission who help manage diuretic. She was diuresed with  IV lasix q 8hrs. echocardiogram obtained on 11/15/2015 showed EF has went down to 35-40%, akinesis of basal mid inferoseptal myocardium, akinesis of the entire inferior myocardium, akinesis of basal mid anteroseptal myocardium, moderate MR. She was extubated on 2/22, however she had increased work of breathing that same night with hemoptysis, and ultimately underwent emergent reintubation. She also had bedside bronchoscopy with finding of a large mass of tissue or clot obstructing the end of endotracheal tube adherent to the tracheal wall, this mass intermittently obstructing and of endotracheal tube. Clot/tissue was ultimately able to be expectorated and removed through the end of the endotracheal tube. She also had significant left pneumothorax and required chest tube placement in the morning of 2/23. Nephrology was also consulted for elevated BUN to creatinine ratio. Given multiple issues, palliative care was consulted, however family want to remain a full code. She has failed extubation again, and the likely will remain ventilator dependent. She ultimately underwent tracheostomy placement on 3/2 and later PEG tube placement on 3/14. She was discharged on 11/25/2015 on 20 mg torsemide to [facility] and later transferred to [facility].  According to family, after transfer to [facility], patient gradually declined. Of note, previous sputum culture obtained on 4/6 showed Klebsiella pneumonia. Patient eventually was sent to the [facility] ED on 01/10/2016 40  increased work of breathing. Per family, patient had a fever of 100.8 and increased trach secretions yesterday. She was also tachycardic. Previous CT of abdomen done in [facility] showed bilateral pleural effusion, consolidation in the left lower lobe, abdominal and pelvic ascites. While in the ED, she was afebrile, significant laboratory finding including lipase 42, BNP of over 3000, normal white blood cell count, troponin 1.2. Chest x-ray showed findings consistent with congestive heart failure and new pulmonary vascular congestion and bilateral pulmonary edema. Patient was admitted by internal medicine service, pulmonary critical care was consulted who recommended continue weaning effort on the vent. Cardiology has been consulted for elevated troponin and acute on chronic combined systolic and diastolic heart failure.    Inpatient Medications  . amLODipine  5 mg Per Tube Daily  . aspirin  150 mg Rectal Daily  . atorvastatin  40 mg Per Tube q1800  . busPIRone  10 mg Per Tube TID  . enoxaparin (LOVENOX) injection  40 mg Subcutaneous Q24H  . ferrous sulfate  300 mg Per Tube Daily  . folic acid  1 mg Per Tube Daily  . furosemide  60 mg Intravenous BID  . haloperidol  0.5 mg Per Tube BID  . insulin aspart  0-9 Units Subcutaneous TID WC  . insulin  glargine  10 Units Subcutaneous QHS  . ipratropium-albuterol  3 mL Nebulization Q6H  . isosorbide mononitrate  15 mg Oral BID  . LORazepam  0.5 mg Oral QHS  . pantoprazole  40 mg Oral Daily  . polyethylene glycol  17 g Per Tube Daily  . potassium chloride  20 mEq Per Tube Once  . predniSONE  10 mg Per Tube Q breakfast  . simethicone  80 mg Per Tube BID  . sodium chloride flush  3 mL Intravenous Q12H    Family History Family History  Problem Relation Age of Onset  . Cancer Mother     Lung  . Heart disease Brother   . Hypertension Brother   . Heart disease Brother   . Hypertension Brother      Social History Social History    Social History  . Marital Status: Married    Spouse Name: N/A  . Number of Children: N/A  . Years of Education: N/A   Occupational History  . Not on file.   Social History Main Topics  . Smoking status: Never Smoker   . Smokeless tobacco: Never Used  . Alcohol Use: No  . Drug Use: No  . Sexual Activity: No   Other Topics Concern  . Not on file   Social History Narrative     Review of Systems  Patient refuses, nonverbal, on trach and back, vent. Daughter at bedside  Physical Exam  Blood pressure 167/102, pulse 106, temperature 99.1 F (37.3 C), temperature source Axillary, resp. rate 25, height  (1.626 m), SpO2 95 %.  General: trach and Peg on vent Psych: confused Neuro: Alert. Moves all extremities spontaneously. HEENT: Normal  Neck: Supple without bruits or JVD. Lungs:  On vent. anterior exam CTA, decreased breath sound in bibasilar region Heart: RRR no s3, s4, or murmurs. Abdomen: Soft, non-tender, non-distended, BS + x 4.  Extremities: No clubbing, cyanosis. DP/PT/Radials 2+ and equal bilaterally. 3+ pitting edema  Labs   Recent Labs  01/09/16 2227 01/10/16 0213  TROPONINI 1.23* 1.06*   Lab Results  Component Value Date   WBC 7.3 01/09/2016   HGB 8.3* 01/09/2016   HCT 27.6* 01/09/2016   MCV 90.2 01/09/2016   PLT 150 01/09/2016    Recent Labs Lab 01/09/16 1434  NA 140  K 3.4*  CL 98*  CO2 31  BUN 70*  CREATININE 1.04*  CALCIUM 8.4*  PROT 5.3*  BILITOT 0.5  ALKPHOS 183*  ALT 103*  AST 104*  GLUCOSE 196*   Lab Results  Component Value Date   CHOL 213* 01/10/2016   HDL 51 01/10/2016   LDLCALC 129* 01/10/2016   TRIG 166* 01/10/2016   No results found for: DDIMER  Radiology/Studies  Dg Chest Port 1 View  01/09/2016  CLINICAL DATA:  Chronic respiratory failure. Bilateral pleural effusions. Fever. EXAM: PORTABLE CHEST 1 VIEW COMPARISON:  12/19/2015, 12/16/2015, 12/12/2015 and 12/02/2015 FINDINGS: Endotracheal tube remains in  good position. Central line has been removed since the prior study. There is new pulmonary vascular congestion with new bilateral slight perihilar edema as well as edema at the right lung base. Haziness at the right lung base has increased since the prior study. There is chronic cardiomegaly. CABG. Aortic atherosclerosis. Chronic haziness at the left base is stable IMPRESSION: Findings consistent with congestive heart failure with new pulmonary vascular congestion and bilateral pulmonary edema. Electronically Signed   By: Francene Boyers M.D.   On: 01/09/2016 14:25   Dg Chest  Port 1 View  12/19/2015  CLINICAL DATA:  Respiratory failure, tracheostomy patient, pulmonary edema; history of CHF, CABG, diabetes, CVA. EXAM: PORTABLE CHEST 1 VIEW COMPARISON:  Portable chest x-ray of December 16, 2015 FINDINGS: The lungs are reasonably well inflated. There is persistent increased density at the lung bases greatest on the left. This is consistent with atelectasis and probable left-sided pleural effusion. The cardiac silhouette remains enlarged. There are chronic post CABG changes. The pulmonary vascularity remains engorged and the pulmonary interstitial markings remain increased. The tracheostomy appliance tip projects at the level of the clavicular heads. The left internal jugular venous catheter tip projects over the midportion of the SVC. IMPRESSION: CHF with bilateral pulmonary interstitial edema. Bibasilar atelectasis and probable small left pleural effusion. The support tubes are in reasonable position. Electronically Signed   By: David  SwazilandJordan M.D.   On: 12/19/2015 07:42   Dg Chest Port 1 View  12/16/2015  CLINICAL DATA:  Respiratory failure EXAM: PORTABLE CHEST 1 VIEW COMPARISON:  12/12/2015 FINDINGS: Vascular congestion is stable. Bibasilar opacity likely a combination of pleural fluid and volume loss is stable. Tracheostomy tube and left jugular venous catheter are stable. No pneumothorax. IMPRESSION: Stable  vascular congestion and bibasilar opacity as described. Electronically Signed   By: Jolaine ClickArthur  Hoss M.D.   On: 12/16/2015 08:42   Dg Chest Port 1 View  12/12/2015  CLINICAL DATA:  Respiratory failure EXAM: PORTABLE CHEST 1 VIEW COMPARISON:  12/02/2015 FINDINGS: Tracheostomy in satisfactory position. Cardiomegaly with pulmonary vascular congestion. No frank interstitial edema. Mild patchy right perihilar/right lower and left basilar opacities, possibly reflecting atelectasis, pneumonia not excluded. No definite pleural effusion.  No pneumothorax. Postsurgical changes related to prior CABG. Left IJ venous catheter terminating at the cavoatrial junction. Median sternotomy. IMPRESSION: Cardiomegaly with pulmonary vascular congestion. No frank interstitial edema. Mild patchy right perihilar/lower lobe and left basilar opacities, possibly atelectasis, pneumonia not excluded. Electronically Signed   By: Charline BillsSriyesh  Krishnan M.D.   On: 12/12/2015 16:29   Dg Abd Portable 1v  12/17/2015  CLINICAL DATA:  Ileus EXAM: PORTABLE ABDOMEN - 1 VIEW COMPARISON:  12/11/2015 FINDINGS: A percutaneous gastric feeding tube again noted. Mild gaseous distended colonic and small bowel loops consistent with ileus without significant change from prior exam. Suboptimal study due to patient's large body habitus. Moderate gas noted within stomach. IMPRESSION: Mild gaseous distended colonic and small bowel loops consistent with ileus without significant change from prior exam. Electronically Signed   By: Natasha MeadLiviu  Pop M.D.   On: 12/17/2015 11:06    ECG  Normal sinus rhythm with PVCs and the bundle branch block.  ASSESSMENT AND PLAN  1. Acute on chronic combined systolic and diastolic heart failure  - Increase Lasix to 80 mg twice a day or 3 times a day  - She has 3+ pitting edema on exam. Chest x-ray finding consistent with heart failure. Difficult to assess JVD as she has Trac collar on. Given the need for PEG, I would not be surprised if  she has low albumin as well which further exacerbated peripheral edema and third spacing of fluid. She needs to start on nutritional diet as soon as possible.  2. Recent pneumonia with positive Klebsiella pneumonia on sputum culture 4/6; per IM  3. Acute on chronic respiratory failure, s/p Trach 3/2 and PEG 3/14 on Ven: wean as tolerated  4. Elevated troponin, nonverbal, unable to assess if this is demand ischemia versus NTSEMI as patient is nonverbal and confused  - Note recent decline  in EF on echocardiogram from 50% down to 35%. Normally will require further workup, however in this case I would not recommend any further ischemic workup, as she is clearly not cath candidate.  - She is currently full code as at the wish of the family, however this will need to be readdressed further with the family as she likely will have poor long-term prognosis  - Continue aspirin, will hold off on adding Plavix as this time given persistent anemia and a history of multiple transfusion  5. iron deficiency anemia requiring multiple transfusion  6. stage III CKD  7. CAD s/p CABG 02/1999 with LIMA to LAD, left radial to OM, SVG to PDA, and SVG to diagonal  Signed, Azalee Course, PA-C 01/10/2016, 7:47 AM   I have seen and examined the patient along with Azalee Course, PA-C.  I have reviewed the chart, notes and new data.  I agree with PA's note.  Key new complaints: unable to communicate with patient, info from daughter Key examination changes: anasarca by exam Key new findings / data: elevated BUN (disproportionate vs creat) and metabolic alkalosis suggest volume contraction, quite contradictory to exam. Echo drop in LVEF noted. On Fio2 0.3 with good oxygenation.  PLAN: Seriously ill patient with severe multi system disease. She appears severely hypervolemic and will benefit from diuresis, while trying to avoid worsening alkalosis, hypokalemia and renal abnormalities. I agree that she is not a cath candidate, for  several reasons. I am afraid that her family has unrealistic expectations for her improvement.   Thurmon Fair, MD, [facility] [phone] 01/10/2016, 3:01 PM

## 2016-01-10 NOTE — ED Notes (Signed)
Pt placed on hospital bed for comfort.

## 2016-01-10 NOTE — Progress Notes (Signed)
CSW received consult for pt to return home with vent vs LTAC placement- no CSW involvement needed for home health or LTAC- [facility].  CSW signing off- please reconsult if family changes their mind and will need SNF placement  Merlyn LotJenna Holoman, LCSWA Clinical Social Worker [phone]

## 2016-01-10 NOTE — ED Notes (Signed)
Portable equipment will deliver kangaroo pump to patient room.

## 2016-01-10 NOTE — Progress Notes (Signed)
PROGRESS NOTE                                                                                                                                                                                                             Patient Demographics:    [name], is a 82 y.o. female, DOB - 06-Oct-1930, ZOX:[id number]  Admit date - 01/09/2016   Admitting Physician Lorretta HarpXilin Niu, MD  Outpatient Primary MD for the patient is Neena RhymesKatherine Tabori, MD  LOS - 1  Outpatient Specialists:   Chief Complaint  Patient presents with  . Abdominal Pain       Brief Narrative    [name] is a 82 y.o. 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  [facility] , and sent to [facility] from 3/3 to 4/11 . She is on PEG tube. Given patient's functional deficits, multiple comorbidities, high risk for decompensation, she was transfered to [facility]. Pt is non-verbal. Per family, pt has fever of 100.8 and increased trach secretions today. Patient has tachycardia with heart rate are up to 135. She has worsening bilateral leg edema. Her daughters do not think [facility]. Per her daughters, patient does not seem to have nausea, vomiting, diarrhea or abdominal pain. Not sure whether patient has chest pain or symptoms of UTI. Pt moves all extremeities. Per daughters, pt had a CT abd done in [facility], which showed bilateral pleural effusions, consolidation in lower lobes, and abdominal and pelvic ascites, but was not started with abx since she did no have fever then. Of note, patient is currently  being weaned off of the vent, and she was weaned on Friday for 3.5 hours.   In ED, patient was found to have lactate of 1.15, troponin 1.23, lipase 42, BNP 3385, WBC 7.3, oxygen saturation 95 200%, potassium 3.4, creatinine 1.04. Chest x-ray showed findings consistent with congestive heart failure with new pulmonary vascular congestion and bilateral pulmonary edema. ABG shows pH of 7.52, pCO2 of 43, pO2 of 74, bicarb of 35. Pt is admitted to inpatient  for further evaluation treatment. PCCM was consulted.    Subjective:    Monique Andreas today has, No headache, No chest pain, No abdominal pain - No Nausea, No new weakness tingling or numbness, No Cough - but +ve SOB.    Assessment  & Plan :     1. Acute on chronic hypoxic respiratory failure in a patient whose trach and vent dependent due to acute on chronic systolic heart failure EF 35-40% on a recent echogram - pulmonary and cardiology following, she is currently on IV Lasix, Imdur, beta blocker, hydralazine, continue ventilator support which is being monitored by pulmonary. Diurese and monitor.  2. Acute on chronic systolic CHF with elevated troponin with history of CAD. Troponin kinetics are suggestive of demand ischemia from CHF, not in ACS pattern, chest pain-free, EKG nonacute, cardiology on board, she is not a Candidate, continue aspirin, statin and beta blocker for secondary prevention. Recent echogram noted with EF 35% and some akinesis.  3. Questionable ventilator associated pneumonia. Had low-grade fever, for now empiric IV antibiotics and follow cultures.  4. GERD. On PPI.  5. Essential hypertension. On combination of Norvasc, Imdur, hydralazine and diuretic.  6. Morbid obesity with generalized deconditioning and bedbound status. Long-term prognosis poor, follow with PCP for weight loss, PT eval, social work input for placement at SNF worse his LTAC was his home with home health PT and ventilator.  7. Depression and anxiety.  Continue home medications.  8. Dysphagia due to tracheostomy. All feeding and medications via PEG tube.  9. Pressure ulcers, small ulcer on the tracheostomy site. Kindly see nursing notes. Wound care consulted.  10. DM type II. Recent A1c was 5.6, continue Lantus and scale.  Lab Results  Component Value Date   HGBA1C 5.6 09/01/2015   CBG (last 3)   Recent Labs  01/10/16 0804  GLUCAP 94      Code Status : Full  Family Communication  : daughters  Disposition Plan  : TBD  Barriers For Discharge : Resp failure  Consults  :  PCCM, Pall Care  Procedures  :   DVT Prophylaxis  :  Lovenox   Lab Results  Component Value Date   PLT 172 01/10/2016    Antibiotics  :    Anti-infectives    Start     Dose/Rate Route Frequency Ordered Stop   01/11/16 2000  levofloxacin (LEVAQUIN) IVPB 750 mg  Status:  Discontinued     750 mg 100 mL/hr over 90 Minutes Intravenous Every 48 hours 01/10/16 0804 01/10/16 1032   01/10/16 2000  levofloxacin (LEVAQUIN) IVPB 750 mg     750 mg 100 mL/hr over 90 Minutes Intravenous Every 24 hours 01/10/16 1032     01/10/16 0830  vancomycin (VANCOCIN) IVPB 750 mg/150 ml premix     750 mg 150 mL/hr over 60 Minutes Intravenous Every 12 hours 01/10/16 0804     01/10/16 0830  aztreonam (AZACTAM) 2 g in dextrose 5 % 50 mL IVPB     2 g 100 mL/hr over 30 Minutes Intravenous Every 8 hours 01/10/16 0804     01/09/16 1445  vancomycin (VANCOCIN) 1,500 mg in sodium chloride 0.9 % 500 mL IVPB     1,500 mg 250 mL/hr over 120 Minutes Intravenous  Once 01/09/16 1435 01/09/16 1753   01/09/16 1445  aztreonam (AZACTAM) 2 g in dextrose 5 % 50 mL IVPB     2 g 100 mL/hr over 30 Minutes Intravenous  Once 01/09/16 1435 01/09/16 1925  01/09/16 1445  levofloxacin (LEVAQUIN) IVPB 750 mg     750 mg 100 mL/hr over 90 Minutes Intravenous  Once 01/09/16 1435 01/09/16 2111        Objective:   Filed Vitals:   01/10/16 1100 01/10/16 1130 01/10/16 1147 01/10/16 1244  BP:  94/42 106/42 106/42   Pulse:  67 71 75  Temp:   98.4 F (36.9 C)   TempSrc:   Oral   Resp: Height:      SpO2:  100% 98% 99%    Wt Readings from Last 3 Encounters:  11/20/15 87 kg (191 lb 12.8 oz)  11/01/15 90.266 kg (199 lb)  10/26/15 89.359 kg (197 lb)     Intake/Output Summary (Last 24 hours) at 01/10/16 1326 Last data filed at 01/09/16 1753  Gross per 24 hour  Intake    500 ml  Output      0 ml  Net    500 ml     Physical Exam  Morbidly obese elderly least an female lying in hospital bed, she is sleepy today,  Generalized weakness and deconditioning, No new F.N deficits,  [place].AT,PERRAL Supple Neck,No JVD, No cervical lymphadenopathy appriciated. Tracheostomy in place hooked to ventilator, Symmetrical Chest wall movement, Good air movement bilaterally, CTAB RRR,No Gallops,Rubs or new Murmurs, No Parasternal Heave +ve B.Sounds, Abd Soft, No tenderness, No organomegaly appriciated, No rebound - guarding or rigidity. PEG tube in place No Cyanosis, Clubbing or edema, No new Rash or bruise       Data Review:    CBC  Recent Labs Lab 01/09/16 1433 01/10/16 0903  WBC 7.3 7.5  HGB 8.3* 9.5*  HCT 27.6* 31.9*  PLT 150 172  MCV 90.2 90.6  MCH 27.1 27.0  MCHC 30.1 29.8*  RDW 18.0* 18.4*  LYMPHSABS 1.0  --   MONOABS 0.7  --   EOSABS 0.0  --   BASOSABS 0.0  --     Chemistries   Recent Labs Lab 01/04/16 0816 01/09/16 1434 01/10/16 0213 01/10/16 0903  NA  --  140  --  142  K 4.1 3.4*  --  4.5  CL  --  98*  --  99*  CO2  --  31  --  29  GLUCOSE  --  196*  --  86  BUN  --  70*  --  64*  CREATININE  --  1.04*  --  1.05*  CALCIUM  --  8.4*  --  8.5*  MG  --   --  2.0 2.0  AST  --  104*  --   --   ALT  --  103*  --   --   ALKPHOS  --  183*  --   --   BILITOT  --  0.5  --   --    ------------------------------------------------------------------------------------------------------------------  Recent Labs  01/10/16 0213  CHOL 213*  HDL  51  LDLCALC 129*  TRIG 166*  CHOLHDL 4.2    Lab Results  Component Value Date   HGBA1C 5.6 09/01/2015   ------------------------------------------------------------------------------------------------------------------ No results for input(s): TSH, T4TOTAL, T3FREE, THYROIDAB in the last 72 hours.  Invalid input(s): FREET3 ------------------------------------------------------------------------------------------------------------------ No results for input(s): VITAMINB12, FOLATE, FERRITIN, TIBC, IRON, RETICCTPCT in the last 72 hours.  Coagulation profile  Recent Labs Lab 01/10/16 0213  INR 1.14    No results for input(s): DDIMER in the last 72 hours.  Cardiac Enzymes  Recent Labs Lab 01/09/16 2227 01/10/16 4540  01/10/16 0727  TROPONINI 1.23* 1.06* 0.83*   ------------------------------------------------------------------------------------------------------------------    Component Value Date/Time   BNP 3385.1* 01/09/2016 1552    Inpatient Medications  Scheduled Meds: . amLODipine  5 mg Per Tube Daily  . aspirin  162 mg Per Tube Daily  . atorvastatin  40 mg Per Tube q1800  . busPIRone  10 mg Per Tube TID  . carvedilol  6.25 mg Per Tube BID WC  . enoxaparin (LOVENOX) injection  40 mg Subcutaneous Q24H  . feeding supplement (VITAL HIGH PROTEIN)  1,000 mL Per Tube Q24H  . ferrous sulfate  300 mg Per Tube Daily  . folic acid  1 mg Per Tube Daily  . furosemide  60 mg Intravenous BID  . haloperidol  0.5 mg Per Tube BID  . hydrALAZINE  50 mg Per Tube 3 times per day  . insulin aspart  0-9 Units Subcutaneous TID WC  . insulin glargine  10 Units Subcutaneous QHS  . ipratropium-albuterol  3 mL Nebulization Q6H  . isosorbide mononitrate  15 mg Oral BID  . LORazepam  0.5 mg Oral QHS  . pantoprazole  40 mg Oral Daily  . polyethylene glycol  17 g Per Tube Daily  . potassium chloride  20 mEq Per Tube Once  . predniSONE  10 mg Per Tube Q breakfast  . simethicone  80 mg  Per Tube BID  . sodium chloride flush  3 mL Intravenous Q12H  . vancomycin  750 mg Intravenous Q12H   Continuous Infusions: . sodium chloride    . aztreonam 2 g (01/10/16 1048)  . levofloxacin (LEVAQUIN) IV     PRN Meds:.sodium chloride, acetaminophen, albuterol, ondansetron (ZOFRAN) IV, sodium chloride flush  Micro Results Recent Results (from the past 240 hour(s))  Culture, blood (routine x 2)     Status: None (Preliminary result)   Collection Time: 01/09/16  3:30 PM  Result Value Ref Range Status   Specimen Description BLOOD RIGHT FOREARM  Final   Special Requests IN PEDIATRIC BOTTLE 2CC  Final   Culture NO GROWTH < 24 HOURS  Final   Report Status PENDING  Incomplete  Culture, blood (routine x 2)     Status: None (Preliminary result)   Collection Time: 01/09/16  3:37 PM  Result Value Ref Range Status   Specimen Description BLOOD RIGHT HAND  Final   Special Requests BOTTLES DRAWN AEROBIC ONLY 5CC  Final   Culture NO GROWTH < 24 HOURS  Final   Report Status PENDING  Incomplete    Radiology Reports Dg Chest Port 1 View  01/09/2016  CLINICAL DATA:  Chronic respiratory failure. Bilateral pleural effusions. Fever. EXAM: PORTABLE CHEST 1 VIEW COMPARISON:  12/19/2015, 12/16/2015, 12/12/2015 and 12/02/2015 FINDINGS: Endotracheal tube remains in good position. Central line has been removed since the prior study. There is new pulmonary vascular congestion with new bilateral slight perihilar edema as well as edema at the right lung base. Haziness at the right lung base has increased since the prior study. There is chronic cardiomegaly. CABG. Aortic atherosclerosis. Chronic haziness at the left base is stable IMPRESSION: Findings consistent with congestive heart failure with new pulmonary vascular congestion and bilateral pulmonary edema. Electronically Signed   By: Francene Boyers M.D.   On: 01/09/2016 14:25       Time Spent in minutes  25   Susa Raring K M.D on 01/10/2016 at 1:26  PM  Between 7am to 7pm - Pager - [phone]  After 7pm go to [email or web address] -  password [facility]  [facility] -  Office  [phone]

## 2016-01-10 NOTE — ED Notes (Signed)
Family concerned that pt has not had tube feeding, no feeding on file except Nepro, called pharmacy for reconcile of meds.

## 2016-01-10 NOTE — ED Notes (Signed)
Cardiology notified of positive troponin,. Daughter requests this nurse wait until she returns in a few minutes before IV is checked.

## 2016-01-10 NOTE — Consult Note (Addendum)
WOC wound consult note Reason for Consult: Consult requested for back lesion and PEG tube site.  Turned patient to over to assess back and did not note any wound.  Daughter at the bedside states there is not one in this location. Wound type: PEG tube with full thickness wound near the location of the lower flange; appears to be a stage 3 healing medical device related pressure injury; present on admission. Daughter states appearance has greatly improved at this time. Measurement: .5X.5X.1cm; located directly underneath the lower right flange of the PEG port Wound bed: 90% red, 10% yellow Drainage (amount, consistency, odor) Small amt tan drainage, no odor Dressing procedure/placement/frequency: Foam dressing to reduce pressure and absorb drainage.  Tape tube upright to reduce pressure on the site.  Discussed plan of care with daughter and she verbalized understanding. Please re-consult if further assistance is needed.  Thank-you,  Cammie Mcgeeawn Latondra Gebhart MSN, RN, CWOCN, [facility], CNS [phone]

## 2016-01-10 NOTE — ED Notes (Signed)
Delay in tube feeding-waiting for tubing to come from materials.

## 2016-01-10 NOTE — ED Notes (Signed)
Pt. Had bowel movement. RN and two techs changed linens, pads, and placed patient in open diaper. Pt. pericare performed as well.

## 2016-01-11 ENCOUNTER — Encounter (HOSPITAL_COMMUNITY): Payer: Self-pay | Admitting: *Deleted

## 2016-01-11 ENCOUNTER — Inpatient Hospital Stay (HOSPITAL_COMMUNITY): Payer: Medicare Other

## 2016-01-11 DIAGNOSIS — J9611 Chronic respiratory failure with hypoxia: Secondary | ICD-10-CM

## 2016-01-11 DIAGNOSIS — I340 Nonrheumatic mitral (valve) insufficiency: Secondary | ICD-10-CM | POA: Insufficient documentation

## 2016-01-11 DIAGNOSIS — I509 Heart failure, unspecified: Secondary | ICD-10-CM

## 2016-01-11 DIAGNOSIS — J810 Acute pulmonary edema: Secondary | ICD-10-CM

## 2016-01-11 DIAGNOSIS — Z7189 Other specified counseling: Secondary | ICD-10-CM

## 2016-01-11 DIAGNOSIS — Z515 Encounter for palliative care: Secondary | ICD-10-CM

## 2016-01-11 DIAGNOSIS — E1159 Type 2 diabetes mellitus with other circulatory complications: Secondary | ICD-10-CM

## 2016-01-11 LAB — BASIC METABOLIC PANEL
Anion gap: 13 (ref 5–15)
BUN: 65 mg/dL — AB (ref 6–20)
CHLORIDE: 95 mmol/L — AB (ref 101–111)
CO2: 30 mmol/L (ref 22–32)
CREATININE: 0.97 mg/dL (ref 0.44–1.00)
Calcium: 8 mg/dL — ABNORMAL LOW (ref 8.9–10.3)
GFR calc Af Amer: >60 mL/min (ref >60)
GFR, EST NON AFRICAN AMERICAN: 53 mL/min — AB (ref >60)
GLUCOSE: 144 mg/dL — AB (ref 65–99)
POTASSIUM: 3.8 mmol/L (ref 3.5–5.1)
SODIUM: 138 mmol/L (ref 135–145)

## 2016-01-11 LAB — RESPIRATORY VIRUS PANEL
ADENOVIRUS: NEGATIVE
INFLUENZA B 1: NEGATIVE
Influenza A: NEGATIVE
METAPNEUMOVIRUS: NEGATIVE
PARAINFLUENZA 1 A: NEGATIVE
PARAINFLUENZA 2 A: NEGATIVE
PARAINFLUENZA 3 A: NEGATIVE
Respiratory Syncytial Virus A: NEGATIVE
Respiratory Syncytial Virus B: NEGATIVE
Rhinovirus: NEGATIVE

## 2016-01-11 LAB — RETICULOCYTES
RBC.: 3.22 MIL/uL — AB (ref 3.87–5.11)
RETIC CT PCT: 3.8 % — AB (ref 0.4–3.1)
Retic Count, Absolute: 122.4 10*3/uL (ref 19.0–186.0)

## 2016-01-11 LAB — ECHOCARDIOGRAM COMPLETE
HEIGHTINCHES: 64 in
Weight: 3488 oz

## 2016-01-11 LAB — GLUCOSE, CAPILLARY
GLUCOSE-CAPILLARY: 124 mg/dL — AB (ref 65–99)
Glucose-Capillary: 147 mg/dL — ABNORMAL HIGH (ref 65–99)
Glucose-Capillary: 144 mg/dL — ABNORMAL HIGH (ref 65–99)
Glucose-Capillary: 206 mg/dL — ABNORMAL HIGH (ref 65–99)
Glucose-Capillary: 177 mg/dL — ABNORMAL HIGH (ref 65–99)
Glucose-Capillary: 211 mg/dL — ABNORMAL HIGH (ref 65–99)

## 2016-01-11 LAB — IRON AND TIBC
Iron: 50 ug/dL (ref 28–170)
SATURATION RATIOS: 22 % (ref 10.4–31.8)
TIBC: 228 ug/dL — AB (ref 250–450)
UIBC: 178 ug/dL

## 2016-01-11 LAB — FERRITIN: FERRITIN: 102 ng/mL (ref 11–307)

## 2016-01-11 LAB — HEMOGLOBIN A1C
Hgb A1c MFr Bld: 5.7 % — ABNORMAL HIGH (ref 4.8–5.6)
Mean Plasma Glucose: 117 mg/dL

## 2016-01-11 LAB — LEGIONELLA PNEUMOPHILA SEROGP 1 UR AG: L. PNEUMOPHILA SEROGP 1 UR AG: NEGATIVE

## 2016-01-11 LAB — VITAMIN B12: Vitamin B-12: 1038 pg/mL — ABNORMAL HIGH (ref 180–914)

## 2016-01-11 LAB — FOLATE: Folate: 79.8 ng/mL (ref >5.9)

## 2016-01-11 MED ORDER — DILTIAZEM HCL 100 MG IV SOLR
5.0000 mg/h | INTRAVENOUS | Status: DC
  Administered 2016-01-11: 15 mg/h via INTRAVENOUS
  Administered 2016-01-11 (×2): 5 mg/h via INTRAVENOUS
  Filled 2016-01-11: qty 100

## 2016-01-11 MED ORDER — DILTIAZEM HCL 100 MG IV SOLR
INTRAVENOUS | Status: AC
  Filled 2016-01-11: qty 100

## 2016-01-11 MED ORDER — LEVALBUTEROL HCL 1.25 MG/0.5ML IN NEBU
1.2500 mg | INHALATION_SOLUTION | Freq: Once | RESPIRATORY_TRACT | Status: AC
  Administered 2016-01-11: 1.25 mg via RESPIRATORY_TRACT

## 2016-01-11 MED ORDER — LEVALBUTEROL HCL 1.25 MG/0.5ML IN NEBU
INHALATION_SOLUTION | RESPIRATORY_TRACT | Status: AC
  Filled 2016-01-11: qty 0.5

## 2016-01-11 MED ORDER — METOPROLOL TARTRATE 1 MG/ML IV SOLN
INTRAVENOUS | Status: AC
  Administered 2016-01-11: 5 mg
  Filled 2016-01-11: qty 5

## 2016-01-11 MED ORDER — BACITRACIN-NEOMYCIN-POLYMYXIN OINTMENT TUBE
TOPICAL_OINTMENT | Freq: Every day | CUTANEOUS | Status: DC
  Administered 2016-01-11 – 2016-01-13 (×3): via TOPICAL
  Filled 2016-01-11 (×2): qty 15

## 2016-01-11 MED ORDER — AMIODARONE HCL IN DEXTROSE 360-4.14 MG/200ML-% IV SOLN
INTRAVENOUS | Status: AC
  Administered 2016-01-11: 30 mg/h via INTRAVENOUS
  Filled 2016-01-11: qty 200

## 2016-01-11 MED ORDER — DILTIAZEM HCL 25 MG/5ML IV SOLN: 10.0000 mg | Freq: Once | INTRAVENOUS | Status: DC

## 2016-01-11 MED ORDER — AMIODARONE LOAD VIA INFUSION
150.0000 mg | Freq: Once | INTRAVENOUS | Status: DC
  Filled 2016-01-11: qty 83.34

## 2016-01-11 MED ORDER — DILTIAZEM HCL 25 MG/5ML IV SOLN
10.0000 mg | Freq: Once | INTRAVENOUS | Status: AC
  Administered 2016-01-11: 17:00:00 via INTRAVENOUS

## 2016-01-11 MED ORDER — SODIUM CHLORIDE 0.9 % IV BOLUS (SEPSIS)
500.0000 mL | Freq: Once | INTRAVENOUS | Status: AC
  Administered 2016-01-11: 500 mL via INTRAVENOUS

## 2016-01-11 MED ORDER — AMIODARONE HCL IN DEXTROSE 360-4.14 MG/200ML-% IV SOLN
30.0000 mg/h | INTRAVENOUS | Status: DC
  Administered 2016-01-11 – 2016-01-12 (×3): 30 mg/h via INTRAVENOUS
  Filled 2016-01-11 (×2): qty 200

## 2016-01-11 MED ORDER — DILTIAZEM HCL 25 MG/5ML IV SOLN
10.0000 mg | Freq: Once | INTRAVENOUS | Status: AC
  Administered 2016-01-11: 10 mg via INTRAVENOUS
  Filled 2016-01-11: qty 5

## 2016-01-11 MED ORDER — AMIODARONE HCL IN DEXTROSE 360-4.14 MG/200ML-% IV SOLN
60.0000 mg/h | INTRAVENOUS | Status: DC
  Administered 2016-01-11: 60 mg/h via INTRAVENOUS

## 2016-01-11 NOTE — Progress Notes (Addendum)
Paged to bedside by Dr. Thedore MinsSingh regarding new afib with RVR. PCCM at bedside as well. EKG and telemetry reviewed. Although wide complex, but clearly irregular. Patient is on trach, vent and has peg. Alert on arrival. Unknown if oriented. Complaining of breathing, but O2 sat 99% on vent and does not appears to be in acute distress. BP 130s. HR 150s. Amiodarone started.   I ordered 5mg  IV lopressor which did not help with HR. Also ordered IV diltiazem started at 10, went up to 15 eventually. Initially she was tolerating the diltiazem, however BP dropped down to 60-70s, IV diltiazem stopped. She is receiving IVF, SBP went back up to 150s.   Dr. Excell Seltzerooper arrived, discussed with family.    Ramond DialSigned, Hao Meng PA Pager: [phone]  Pt examined. Discussed with family. She developed AF with RVR with associated hemodynamic instability after receiving IV dilt. Fortunately she has now stabilized on IV amiodarone, still in AF but HR about 120 bpm and BP stable. They understand this is one additional problem to her multitude of serious medical problems. Family well-known to me as I cared for this patient's husband for many years.  Tonny BollmanCooper, Alden Feagan 01/11/2016 6:40 PM

## 2016-01-11 NOTE — Significant Event (Signed)
Rapid Response Event Note  Overview:  Called from unit for elevated HR Time Called: 1712 Arrival Time: 1716 Event Type: Cardiac  Initial Focused Assessment: Upon assessment the patient was having labored breathing while on the vent via trach. Patient currently on 30% fio2, bp 103/75, 02 sats 92. Patients lungs clear bilaterally, with Afib RVR 150-170's.  Interventions: Patient started on cardizem gtt at 15mg /hr, amio gtt started with a 150mg  bolus and then to start at 60mg /hr. Patient given 5mg  IVP Lopressor. Patient bag and suctioned via RT and fi02 increased to 50%. Stat CXR shows worsening pulmonary edema.  Event Summary:   [facility]   [facility] notified via primary RN Thurston Hole(Anne)    at          Faylene MillionSmith, Yair Dusza Allen

## 2016-01-11 NOTE — Progress Notes (Signed)
Patient Name: [name] Date of Encounter: 01/11/2016  Principal Problem:   Acute on chronic systolic (congestive) heart failure (HCC) Active Problems:   Pulmonary edema   GERD (gastroesophageal reflux disease)   Iron deficiency anemia   Hyperlipidemia   Essential hypertension   HCAP (healthcare-associated pneumonia)   Obesity (BMI 30-39.9)   Hx of CABG-'00   Acute encephalopathy   Type 2 diabetes mellitus with vascular disease (HCC)   PEG (percutaneous endoscopic gastrostomy) adjustment/replacement/removal (HCC)   Respiratory failure (HCC)   CHF exacerbation (HCC)   Elevated troponin   Length of Stay: 2  SUBJECTIVE  Much more alert today, generally appears better, but also appears very anxious. Fair diuresis, without major change in renal parameters. Echo being performed. Quick limited bedside image review shows no change in LVEF (approx 405), but there seems to be at least moderate to severe (versus severe) mitral insufficiency.  CURRENT MEDS . amLODipine  5 mg Per Tube Daily  . aspirin  162 mg Per Tube Daily  . atorvastatin  40 mg Per Tube q1800  . aztreonam  2 g Intravenous Q8H  . busPIRone  10 mg Per Tube TID  . carvedilol  6.25 mg Per Tube BID WC  . Chlorhexidine Gluconate Cloth  6 each Topical Q0600  . enoxaparin (LOVENOX) injection  40 mg Subcutaneous Q24H  . feeding supplement (VITAL HIGH PROTEIN)  1,000 mL Per Tube Q24H  . ferrous sulfate  300 mg Per Tube Daily  . folic acid  1 mg Per Tube Daily  . furosemide  60 mg Intravenous BID  . haloperidol  0.5 mg Per Tube BID  . hydrALAZINE  50 mg Per Tube 3 times per day  . insulin aspart  0-9 Units Subcutaneous TID WC  . insulin glargine  10 Units Subcutaneous QHS  . isosorbide mononitrate  15 mg Oral BID  . levofloxacin (LEVAQUIN) IV  750 mg Intravenous Q24H  . LORazepam  0.5 mg Oral QHS  . mupirocin ointment  1 application Nasal BID  . neomycin-bacitracin-polymyxin   Topical Daily  . pantoprazole  40  mg Oral Daily  . polyethylene glycol  17 g Per Tube Daily  . potassium chloride  20 mEq Per Tube Once  . predniSONE  10 mg Per Tube Q breakfast  . simethicone  80 mg Per Tube BID  . sodium chloride flush  3 mL Intravenous Q12H  . vancomycin  750 mg Intravenous Q12H    OBJECTIVE   Intake/Output Summary (Last 24 hours) at 01/11/16 1347 Last data filed at 01/11/16 1000  Gross per 24 hour  Intake   1253 ml  Output   2250 ml  Net   -997 ml   Filed Weights   01/10/16 1810 01/11/16 0500  Weight: 96.843 kg (213 lb 8 oz) 98.884 kg (218 lb)    PHYSICAL EXAM Filed Vitals:   01/11/16 0851 01/11/16 0900 01/11/16 1140 01/11/16 1317  BP: 94/40 107/39 139/66 153/81  Pulse: 86 79 73   Temp: 97.6 F (36.4 C)     TempSrc: Oral     Resp: Height:      Weight:      SpO2:  98% 98%    General: Alert, anxious, on trach/vent Head: no evidence of trauma, PERRL, EOMI, no exophtalmos or lid lag, no myxedema, no xanthelasma; normal ears, nose and oropharynx Neck: normal jugular venous pulsations and no hepatojugular reflux; brisk carotid pulses without delay and no carotid bruits  Chest: reduced bibasilar breath sounds, no rales, symmetrical and full respiratory excursions Cardiovascular: normal position and quality of the apical impulse, regular rhythm, normal first and second heart sounds, no rubs or gallops, 2/6 holosystolic apical murmur Abdomen: no tenderness or distention, no masses by palpation, no abnormal pulsatility or arterial bruits, normal bowel sounds, no hepatosplenomegaly Extremities: no clubbing, cyanosis; 3+ leg edema; 2+ radial, ulnar and brachial pulses bilaterally; 2+ right femoral, posterior tibial and dorsalis pedis pulses; 2+ left femoral, posterior tibial and dorsalis pedis pulses; no subclavian or femoral bruits Neurological: grossly nonfocal  LABS  CBC  Recent Labs  01/09/16 1433 01/10/16 0903  WBC 7.3 7.5  NEUTROABS 5.6  --   HGB 8.3* 9.5*  HCT 27.6*  31.9*  MCV 90.2 90.6  PLT 150 172   Basic Metabolic Panel  Recent Labs  01/10/16 0213 01/10/16 0903 01/11/16 0403  NA  --  142 138  K  --  4.5 3.8  CL  --  99* 95*  CO2  --  29 30  GLUCOSE  --  86 144*  BUN  --  64* 65*  CREATININE  --  1.05* 0.97  CALCIUM  --  8.5* 8.0*  MG 2.0 2.0  --    Liver Function Tests  Recent Labs  01/09/16 1434  AST 104*  ALT 103*  ALKPHOS 183*  BILITOT 0.5  PROT 5.3*  ALBUMIN 1.9*    Recent Labs  01/09/16 1434  LIPASE 42   Cardiac Enzymes  Recent Labs  01/10/16 0213 01/10/16 0727 01/10/16 1238  TROPONINI 1.06* 0.83* 0.71*   BNP Invalid input(s): POCBNP D-Dimer No results for input(s): DDIMER in the last 72 hours. Hemoglobin A1C  Recent Labs  01/10/16 0213  HGBA1C 5.7*   Fasting Lipid Panel  Recent Labs  01/10/16 0213  CHOL 213*  HDL 51  LDLCALC 129*  TRIG 166*  CHOLHDL 4.2   Thyroid Function Tests No results for input(s): TSH, T4TOTAL, T3FREE, THYROIDAB in the last 72 hours.  Invalid input(s): FREET3  Radiology Studies Imaging results have been reviewed and Dg Chest Port 1 View  01/11/2016  CLINICAL DATA:  Short of breath EXAM: PORTABLE CHEST 1 VIEW COMPARISON:  01/09/2016 FINDINGS: Tracheostomy remains in good position. Postop CABG. Cardiac enlargement. Vascular congestion with mild edema unchanged. Bibasilar atelectasis/ infiltrate unchanged. Small bilateral effusions unchanged. IMPRESSION: Congestive heart failure with pulmonary edema and bilateral effusions unchanged. Bibasilar atelectasis/ infiltrate also unchanged. Electronically Signed   By: Marlan Palau M.D.   On: 01/11/2016 09:35   Dg Chest Port 1 View  01/09/2016  CLINICAL DATA:  Chronic respiratory failure. Bilateral pleural effusions. Fever. EXAM: PORTABLE CHEST 1 VIEW COMPARISON:  12/19/2015, 12/16/2015, 12/12/2015 and 12/02/2015 FINDINGS: Endotracheal tube remains in good position. Central line has been removed since the prior study. There is  new pulmonary vascular congestion with new bilateral slight perihilar edema as well as edema at the right lung base. Haziness at the right lung base has increased since the prior study. There is chronic cardiomegaly. CABG. Aortic atherosclerosis. Chronic haziness at the left base is stable IMPRESSION: Findings consistent with congestive heart failure with new pulmonary vascular congestion and bilateral pulmonary edema. Electronically Signed   By: Francene Boyers M.D.   On: 01/09/2016 14:25    TELE Sinus tachycardia   ASSESSMENT AND PLAN   1. Acute on chronic combined systolic and diastolic heart failure - once echo completed, will compare images side by side with old study  -mitral insufficiency  appears very significant and has eccentric posterior jet typical of ischemic mechanism, rather than central jet of decompensated cardiomyopathy. She is a very poor candidate for surgical repair.  - continue diuretics as tolerated by renal function/electrolytes  2. Recent pneumonia with positive Klebsiella pneumonia on sputum culture 4/6; per IM  3. Acute on chronic respiratory failure, s/p Trach 3/2 and PEG 3/14 on Vent. PCCM notes suggest that she is very unlikely to wean off vent. I don't think the patient and her family have truly understood this. They seem to have unrealistic expectations about her chances of recovery ("maybe she can eat real food?", "maybe can stay off vent during the day, 8-10 hours every day?"  4. Elevated troponin, unable to assess if this is demand ischemia versus NTSEMI as patient is nonverbal and confused - Note recent decline in EF on echocardiogram from 50% down to 35%. Normally will require further workup, however in this case I would not recommend any further ischemic workup, as she is clearly not cath candidate. - She is currently full code as at the wish of the family, however this will need to be readdressed further with the family as  she likely will have poor long-term prognosis - Continue aspirin, will hold off on adding Plavix as this time given persistent anemia and a history of multiple transfusion  5. iron deficiency anemia requiring multiple transfusion  6. stage III CKD  7. CAD s/p CABG 02/1999 with LIMA to LAD, left radial to OM, SVG to PDA, and SVG to diagonal  Continue to educate the patient and her family about the extent, severity and complexity of her medical condition. Prognosis is poor, beyond home ventilator with very short limited periods off vent and continued tube feeds.   Thurmon FairMihai Alif Petrak, MD, [facility] [phone] office [phone] pager 01/11/2016 1:47 PM

## 2016-01-11 NOTE — Progress Notes (Signed)
  Echocardiogram 2D Echocardiogram has been performed.  Leta JunglingCooper, Kilea Mccarey M 01/11/2016, 2:02 PM

## 2016-01-11 NOTE — Progress Notes (Signed)
Initial Nutrition Assessment  DOCUMENTATION CODES:   Obesity unspecified  INTERVENTION:   Initiate Vital High Protein formula at 20 ml/hr and increase by 10 ml every 4 hours to goal rate of 50 ml/hr  Above TF regimen to provide 1320 kcals, 105 gm protein, 1003 ml of free water  NUTRITION DIAGNOSIS:   Inadequate oral intake related to inability to eat as evidenced by NPO status  GOAL:   Patient will meet greater than or equal to 90% of their needs   MONITOR:   TF tolerance, Vent status, Labs, Weight trends, Skin, I & O's  REASON FOR ASSESSMENT:   Consult Enteral/tube feeding initiation and management  ASSESSMENT:   82 y.o. 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  Patient is on ventilator support -- trach MV: 8.7 L/min Temp (24hrs), Avg:98 F (36.7 C), Min:97.6 F (36.4 C), Max:98.4 F (36.9 C)   [facility]; [facility] from 3/3 to 4/11.  Transferred to [facility]. Pt receives nutrition support via PEG tube; TF regimen PTA unknown. CWOCN note 4/18 reviewed. Vital High Protein formula initiated via Adult Tube Feeding Protocol.  Nutrition focused physical exam completed.  No muscle or subcutaneous fat depletion noticed.  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  Estimated Nutritional Needs:   Kcal:  1200-1400  Protein:  100-110 gm  Fluid:  per MD  EDUCATION NEEDS:   No education needs identified at this time  Maureen ChattersKatie Daissy Yerian, RD, LDN Pager #: [phone] After-Hours Pager #: [phone]

## 2016-01-11 NOTE — Progress Notes (Signed)
RT bag lavaged patient and changed the trach inner cannula due to desat. Patient was suctioned with very little secretions. Patient placed back on full vent support.

## 2016-01-11 NOTE — Evaluation (Signed)
Physical Therapy Evaluation Patient Details Name: [name] MRN: [id number] DOB: [birth year] Today's Date: 01/11/2016   History of Present Illness  Patient is an 82 y/o female admitted from [facility] due to fever, increased trach secretions, worsening mental status, tachycardia, bilateral leg edema.  She has complicated recent PMH since hospital admission in January this year with PNA then on  [facility] from 3/3 to 4/11, then to [facility] due to functional deficits, multiple comorbidities, high risk for decompensation.  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  Clinical Impression  Patient presents with decreased mobility including decreased tolerance to upright sitting, decreased sitting balance and decreased UE use for ADL's compared to prior to her last hospitalization beginning in January.  Per daughter has not had much PT/therapy intervention.  Patient responded well today to LE therex and sitting EOB and with daughter assist was able to work on sitting balance and tolerance to upright.  Feel she is a good candidate to return to [facility] for continued weaning efforts as well as skilled PT.  Will continued skilled PT in the acute setting as well.    Follow Up Recommendations LTACH    Equipment Recommendations  Other (comment) (per daughter may need new hosptial bed and hoyer lift when d/c home)    Recommendations for Other Services       Precautions / Restrictions Precautions Precautions: Fall Precaution Comments: chronic trach/vent, MRSA      Mobility  Bed Mobility Overal bed mobility: Needs Assistance Bed Mobility: Supine to Sit;Sit to Supine     Supine to sit: +2 for physical assistance;Total assist Sit to supine: +2 for physical assistance;Total assist   General bed mobility comments:  assisted to scoot to EOB with pad, then lifted trunk upright; to supine with assist for legs;trunk and scooting up in bed  Transfers                 General transfer comment: NT due to safety  Ambulation/Gait                Stairs            Wheelchair Mobility    Modified Rankin (Stroke Patients Only)       Balance Overall balance assessment: Needs assistance Sitting-balance support: Feet supported;No upper extremity supported Sitting balance-Leahy Scale: Poor Sitting balance - Comments: leaning back on PT during most of session, but active trunk movements noted, then pt pulled up on daughter's hand at one point and stayed forward without back support about 20 seconds.  Overall up at EOB about 10 minutes                                     Pertinent Vitals/Pain Pain Assessment: Faces Faces Pain Scale: Hurts little more Pain Location: reports some LE soreness Pain Descriptors / Indicators: Sore Pain Intervention(s): Monitored during session;Limited activity within patient's tolerance;Repositioned    Home Living Family/patient expects to be discharged to:: Other (Comment) (LTACH)                      Prior Function Level of Independence: Needs assistance   Gait / Transfers Assistance Needed: lift at home for transfers (reports HHPT stated lift inadequate for pt along with the hospital bed they  have  ADL's / Homemaking Assistance Needed: daughter assisted with shower at home prior to Jan admission at w/c level, pt could feed self using both hands and able to participate with some upper body ADL        Hand Dominance        Extremity/Trunk Assessment   Upper Extremity Assessment: Defer to OT evaluation           Lower Extremity Assessment: RLE deficits/detail;LLE deficits/detail RLE Deficits / Details: AAROM limited ankle DF and hip/knee flexion with edema, but pt able to assist with movement LLE Deficits / Details:  AAROM limited ankle DF and hip/knee flexion with edema, but pt able to assist with movement  Cervical / Trunk Assessment: Kyphotic  Communication   Communication: Prefers language other than English;Tracheostomy (Arabic)  Cognition Arousal/Alertness: Awake/alert Behavior During Therapy: WFL for tasks assessed/performed Overall Cognitive Status: Difficult to assess                      General Comments General comments (skin integrity, edema, etc.): daughter present throughout and translating instructions for pt    Exercises General Exercises - Lower Extremity Ankle Circles/Pumps: AAROM;Both;5 reps;Supine Heel Slides: AAROM;Both;5 reps;Supine      Assessment/Plan    PT Assessment Patient needs continued PT services  PT Diagnosis Generalized weakness   PT Problem List Decreased strength;Decreased activity tolerance;Decreased balance;Decreased mobility;Cardiopulmonary status limiting activity;Decreased knowledge of use of DME  PT Treatment Interventions DME instruction;Patient/family education;Functional mobility training;Therapeutic activities;Therapeutic exercise;Balance training   PT Goals (Current goals can be found in the Care Plan section) Acute Rehab PT Goals Patient Stated Goal: To return to [facility] then go home PT Goal Formulation: With patient/family Time For Goal Achievement: 01/25/16 Potential to Achieve Goals: Fair    Frequency Min 2X/week   Barriers to discharge        Co-evaluation PT/OT/SLP Co-Evaluation/Treatment: Yes Reason for Co-Treatment: Complexity of the patient's impairments (multi-system involvement);For patient/therapist safety PT goals addressed during session: Balance;Mobility/safety with mobility         End of Session Equipment Utilized During Treatment: Other (comment) (vent) Activity Tolerance: Patient tolerated treatment well;Patient limited by fatigue Patient left: in bed;with call bell/phone within reach;with family/visitor  present           Time: [id number] PT Time Calculation (min) (ACUTE ONLY): 37 min   Charges:   PT Evaluation $PT Eval High Complexity: 1 Procedure     PT G CodesElray Mcgregor:        Yamilka Lopiccolo 01/11/2016, 2:21 PM  Sheran Lawlessyndi Harden Bramer, PT [phone] 01/11/2016

## 2016-01-11 NOTE — Progress Notes (Signed)
Patient transported on vent from ED to 2C03. Vitals remained stable throughout.

## 2016-01-11 NOTE — Progress Notes (Signed)
Around 1700 pt was cleaned up due loose BM for the second time, HR started going up to 130's -150's ,sat's was @ 95-97%, MD was paged, HR even went up to 180's, MD ordered diltiazem 10 mg IV ,started desating, RT at bedside and suctioned the pt,EKG was done, Rapid response and MD's at bedside.

## 2016-01-11 NOTE — Progress Notes (Signed)
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   PCCM is consulted for vent management. She is being admitted by IM.   VITAL SIGNS: BP 139/66 mmHg  Pulse 73  Temp(Src) 97.6 F (36.4 C) (Oral)  Resp 26  Ht 5\' 4"  (1.626 m)  Wt 218 lb (98.884 kg)  BMI 37.40 kg/m2  SpO2 98%  HEMODYNAMICS:    VENTILATOR SETTINGS: Vent Mode:  [-] PRVC FiO2 (%):  [30 %] 30 % Set Rate:  [18 bmp] 18 bmp Vt Set:  [380 mL] 380 mL PEEP:  [5 cmH20] 5 cmH20 Plateau Pressure:  [20 cmH20-26 cmH20] 23 cmH20  INTAKE / OUTPUT: I/O last 3 completed shifts: In: 1053 [I.V.:3; Other:150; NG/GT:700; IV Piggyback:200] Out: 2250 [Urine:2250]  PHYSICAL EXAMINATION: General: no distress on full vent support  Neuro:  Alert, on vent, PERRLA HEENT:  NCAT, mmm, no pharyngeal erythema Cardiovascular: tachycardic to 100s Lungs:  Diffuse crackles  Abdomen:  Obese, +BS Extremities: 3+ pitting edema to the upper thigh, right inner knee has post-surgical scar  LABS:  BMET  Recent Labs Lab 01/09/16 1434 01/10/16 0903 01/11/16 0403  NA 140 142 138  K 3.4* 4.5 3.8  CL 98* 99* 95*  CO2 31 29 30   BUN 70* 64* 65*  CREATININE 1.04* 1.05* 0.97  GLUCOSE 196* 86 144*    Electrolytes  Recent Labs Lab 01/09/16 1434 01/10/16 0213 01/10/16 0903 01/11/16 0403  CALCIUM 8.4*  --  8.5* 8.0*  MG  --  2.0 2.0  --     CBC  Recent Labs Lab 01/09/16 1433 01/10/16 0903  WBC 7.3 7.5  HGB 8.3* 9.5*  HCT 27.6* 31.9*  PLT 150 172    Coag's  Recent Labs Lab 01/10/16 0213  APTT 38*  INR 1.14    Sepsis Markers  Recent Labs Lab  01/09/16 1434  LATICACIDVEN 1.15    ABG  Recent Labs Lab 01/09/16 1405  PHART 7.527*  PCO2ART 43.0  PO2ART 74.0*    Liver Enzymes  Recent Labs Lab 01/09/16 1434  AST 104*  ALT 103*  ALKPHOS 183*  BILITOT 0.5  ALBUMIN 1.9*    Cardiac Enzymes  Recent Labs Lab 01/10/16 0213 01/10/16 0727 01/10/16 1238  TROPONINI 1.06* 0.83* 0.71*    Glucose  Recent Labs Lab 01/10/16 0804 01/10/16 1743 01/10/16 2005 01/11/16 0048 01/11/16 0405 01/11/16 0734  GLUCAP 94 153* 126* 124* 147* 144*    Imaging Dg Chest Port 1 View  01/11/2016  CLINICAL DATA:  Short of breath EXAM: PORTABLE CHEST 1 VIEW COMPARISON:  01/09/2016 FINDINGS: Tracheostomy remains in good position. Postop CABG. Cardiac enlargement. Vascular congestion with mild edema unchanged. Bibasilar atelectasis/ infiltrate unchanged. Small bilateral effusions unchanged. IMPRESSION: Congestive heart failure with pulmonary edema and bilateral effusions unchanged. Bibasilar atelectasis/ infiltrate also unchanged. Electronically Signed   By: Marlan Palauharles  Clark M.D.   On: 01/11/2016 09:35   Pulmonary edema/ support tubes in satisfactory position  STUDIES:  CT abd pelvis 4/15 at [facility]> CXR 4/17 > pulm edema  CULTURES: Bcr 4/17 > (-) MRSA (+)  ANTIBIOTICS: Aztreonam 4/17> Levofloxacin 4/17> Vanc 4/17>  SIGNIFICANT EVENTS: 4/17 admit from [facility] for SOB  LINES/TUBES:   82 year old female well known to PCCM service who has a chronic trach s/p CVA. Patient is morbidly obese and is vent dependent in vent SNF. She has what looks like a significant underlying metabolic alkalosis. Suspect that this is from a mix of diuretics as well as chronic respiratory acidosis during weaning efforts. Doubt she is a candidate for anything more than short intervals off mechanical ventilation at best; and more likely not weanable at all.    Chronic respiratory failure, vent/trach dependent in setting of OHS/OSA, severe  deconditioning, malnutrition and h/o CVA: - Continue current vent settings. - Hold weaning for now. If we are to re-attempt weaning our expectations should be w/ goal to get off vent for only short day-time efforts and MANDATORY nocturnal ventilation - CXR intermittently. - Titrate O2 for sat of 88-92%.  Acute pulmonary edema: due to CHF. - Diureses as BP allows.  - F/U imaging PRN.  Acute on chronic CHF Baseline systolic CM w: EF 35-40% - Diureses. - Lipitor.  Positive troponins: - per cards   Suggest deescalate abx once with final cultures.   Pollie Meyer, MD 01/11/2016, 12:38 PM [facility] Pager [phone] After 3 pm or if no answer, call [phone]

## 2016-01-11 NOTE — Evaluation (Signed)
Occupational Therapy Evaluation Patient Details Name: [name] MRN: [id number] DOB: Nov 20, 1930 Today's Date: 01/11/2016    History of Present Illness Patient is an 82 y/o female admitted from [facility] due to fever, increased trach secretions, worsening mental status, tachycardia, bilateral leg edema.  She has complicated recent PMH since hospital admission in January this year with PNA then on  [facility] from 3/3 to 4/11, then to [facility] due to functional deficits, multiple comorbidities, high risk for decompensation.  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   Clinical Impression   PT admitted with see above. Pt currently with functional limitiations due to the deficits listed below (see OT problem list).  Pt will benefit from skilled OT to increase their independence and safety with adls and balance to allow discharge Ltach. Family hoping patient will be able to return to [facility] and progress to home. Family really wants patient to return home. Question need for Palliative consult to help with progress to home.      Follow Up Recommendations  LTACH;Supervision/Assistance - 24 hour    Equipment Recommendations  Other (comment) (defer to [facility])    Recommendations for Other Services Other (comment) (ltach)     Precautions / Restrictions Precautions Precautions: Fall Precaution Comments: chronic trach/vent, MRSA      Mobility Bed Mobility Overal bed mobility: Needs Assistance Bed Mobility: Supine to Sit;Sit to Supine     Supine to sit: +2 for physical assistance;Total assist Sit to supine: +2 for physical assistance;Total assist   General bed mobility comments: assisted to scoot to EOB with pad, then lifted trunk upright; to supine with assist for legs;trunk and scooting up in  bed  Transfers                 General transfer comment: note tested due to safety and prolonged bed rest    Balance Overall balance assessment: Needs assistance Sitting-balance support: No upper extremity supported;Feet supported Sitting balance-Leahy Scale: Poor Sitting balance - Comments: leaning back on OT during most of session, but active trunk movements noted, then pt pulled up on daughter's hand at one point and stayed forward without back support about 20 seconds.  Overall up at EOB about 10 minutes                                    ADL Overall ADL's : Needs assistance/impaired Eating/Feeding: NPO                                     General ADL Comments: Pt requires total (A) at this time. Daughter reports previously being able to stand for peri care     Vision     Perception     Praxis      Pertinent Vitals/Pain Pain Assessment: Faces Faces Pain Scale: Hurts little more Pain Location: LE soreness Pain Descriptors / Indicators: Sore Pain Intervention(s): Monitored during session;Repositioned     Hand Dominance Right   Extremity/Trunk Assessment Upper Extremity Assessment Upper Extremity Assessment: Generalized weakness;RUE deficits/detail RUE Deficits / Details: contracture at elbow , shoulder abduction to ~70 degrees, contracture at digits, Pt demonstrates tone present. pt with baseline R UE deficits since CVA   Lower Extremity  Assessment Lower Extremity Assessment: Defer to PT evaluation RLE Deficits / Details: AAROM limited ankle DF and hip/knee flexion with edema, but pt able to assist with movement LLE Deficits / Details: AAROM limited ankle DF and hip/knee flexion with edema, but pt able to assist with movement   Cervical / Trunk Assessment Cervical / Trunk Assessment: Kyphotic   Communication Communication Communication: Prefers language other than English;Tracheostomy   Cognition Arousal/Alertness:  Awake/alert Behavior During Therapy: WFL for tasks assessed/performed Overall Cognitive Status: Difficult to assess                     General Comments       Exercises       Shoulder Instructions      Home Living Family/patient expects to be discharged to:: Other (Comment)                                 Additional Comments: family wants to return to [facility]      Prior Functioning/Environment Level of Independence: Needs assistance  Gait / Transfers Assistance Needed: lift at home for transfers (reports HHPT stated lift inadequate for pt along with the hospital bed they have ADL's / Homemaking Assistance Needed: daughter assisted with shower at home prior to Jan admission at w/c level, pt could feed self using both hands and able to participate with some upper body ADL        OT Diagnosis: Generalized weakness;Cognitive deficits   OT Problem List: Decreased strength;Decreased activity tolerance;Impaired balance (sitting and/or standing);Decreased safety awareness;Decreased knowledge of use of DME or AE;Decreased knowledge of precautions;Obesity;Cardiopulmonary status limiting activity   OT Treatment/Interventions: Self-care/ADL training;Neuromuscular education;Therapeutic exercise;DME and/or AE instruction;Therapeutic activities;Cognitive remediation/compensation;Patient/family education;Balance training    OT Goals(Current goals can be found in the care plan section) Acute Rehab OT Goals Patient Stated Goal: To return to [facility] then go home OT Goal Formulation: Patient unable to participate in goal setting Potential to Achieve Goals: Good  OT Frequency: Min 1X/week   Barriers to D/C:            Co-evaluation PT/OT/SLP Co-Evaluation/Treatment: Yes Reason for Co-Treatment: Complexity of the patient's impairments (multi-system involvement);For patient/therapist safety PT goals addressed during session: Balance;Mobility/safety with mobility OT goals  addressed during session: ADL's and self-care;Strengthening/ROM      End of Session Equipment Utilized During Treatment: Oxygen Nurse Communication: Mobility status;Precautions  Activity Tolerance: Patient tolerated treatment well Patient left: in bed;with call bell/phone within reach;with bed alarm set;with family/visitor present   Time: [id number] OT Time Calculation (min): 34 min Charges:  OT General Charges $OT Visit: 1 Procedure OT Evaluation $OT Eval High Complexity: 1 Procedure G-Codes:    Boone MasterJones, Zikeria Keough B 01/11/2016, 3:35 PM  Mateo FlowJones, Brynn   OTR/L Pager: [phone]: [phone] Office: [phone] .

## 2016-01-11 NOTE — Progress Notes (Addendum)
PROGRESS NOTE                                                                                                                                                                                                             Patient Demographics:    [name], is a 82 y.o. female, DOB - Jul 03, 1931, ZOX:[id number]  Admit date - 01/09/2016   Admitting Physician Lorretta Harp, MD  Outpatient Primary MD for the patient is [name], MD  LOS - 2  Outpatient Specialists:   Chief Complaint  Patient presents with  . Abdominal Pain       Brief Narrative    [name] is a 82 y.o. 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  Patient was discharged from [facility] on 3/3 , and sent to [facility] from 3/3 to 4/11 . She is on PEG tube. Given patient's functional deficits, multiple comorbidities, high risk for decompensation, she was transfered to [facility]. Pt is non-verbal. Per family, pt has fever of 100.8 and increased trach secretions today. Patient has tachycardia with heart rate are up to 135. She has worsening bilateral leg edema. Her daughters do not think pt is getting any diuretics at [facility]. Per her daughters, patient does not seem to have nausea, vomiting, diarrhea or abdominal pain. Not sure whether patient has chest pain or symptoms of UTI. Pt moves all extremeities. Per daughters, pt had a CT abd done in [facility], which showed bilateral pleural effusions, consolidation in lower lobes, and abdominal and pelvic ascites, but was not started with abx since she did no have fever then. Of note, patient is currently  being weaned off of the vent, and she was weaned on Friday for 3.5 hours.   In ED, patient was found to have lactate of 1.15, troponin 1.23, lipase 42, BNP 3385, WBC 7.3, oxygen saturation 95 200%, potassium 3.4, creatinine 1.04. Chest x-ray showed findings consistent with congestive heart failure with new pulmonary vascular congestion and bilateral pulmonary edema. ABG shows pH of 7.52, pCO2 of 43, pO2 of 74, bicarb of 35. Pt is admitted to inpatient  for further evaluation treatment. PCCM was consulted.    Subjective:    [name] today has, No headache, No chest pain, No abdominal pain - No Nausea, No new weakness tingling or numbness, No Cough - but +ve SOB. She nods her head to questions helped by her daughter.   Assessment  & Plan :    Addendum at 5.20pm - New aflutter -180s, Resp rate 25/min, SBP 120s, IV Cardizem, Amio drip, CXR, Nebs, IVF bolus ? Rapid diuresis vs Mucous plug, PCCM and Cards both called.   1. Acute on chronic hypoxic respiratory failure in a patient whose trach and vent dependent due to acute on chronic systolic heart failure EF 35-40% on a recent echogram - pulmonary and cardiology following, she is currently on IV Lasix, Imdur, beta blocker, hydralazine, continue ventilator support which is being monitored by pulmonary, cards on the case too. Diurese and monitor.  2. Acute on chronic systolic CHF with elevated troponin & with history of CAD status post CABG in the past. Troponin kinetics are suggestive of demand ischemia from CHF, not in ACS pattern, chest pain-free, EKG nonacute, cardiology on board, she is not a Candidate, continue aspirin, statin and beta blocker for secondary prevention. Recent echogram noted with EF 35% and some akinesis.  3. Questionable ventilator associated pneumonia. Had low-grade fever, for now empiric IV antibiotics and follow cultures. Flu PCR -ve. Will taper off antibiotics quickly.  4. GERD. On PPI.  5. Essential hypertension. On  combination of Norvasc, Imdur, hydralazine and diuretic.  6. Morbid obesity with generalized deconditioning and bedbound status. Long-term prognosis poor, follow with PCP for weight loss, PT eval, social work input for placement at SNF worse his LTAC was his home with home health PT and ventilator.  7. Depression and anxiety. Continue home medications.  8. Dysphagia due to tracheostomy. All feeding and medications via PEG tube.  9. Pressure ulcers, small ulcer on the tracheostomy site. Kindly see nursing notes. Wound care consulted.  10. Chronic prednisone use. Has been on it for a few months. Per family placed by pulmonary, I appreciate no wheezing, we'll start to taper off over the next 1-2 weeks.  11. H/O Iron def Anemia and multiple transfusions - check anemia panel and monitor, in the setting of recurrent CHF decompensation will try to keep her hemoglobin around 8.   12. H/O CVA with R.Hemiperesis - ASA- Statin, PT.  13. DM type II. Recent A1c was 5.6, continue Lantus and scale. On PEG tube feeds.  Lab Results  Component Value Date   HGBA1C 5.7* 01/10/2016   CBG (last 3)   Recent Labs  01/11/16 0048 01/11/16 0405 01/11/16 0734  GLUCAP 124* 147* 144*      Code Status : Full  Family Communication  : daughters  Disposition Plan  : TBD  Barriers For Discharge : Resp failure  Consults  :  PCCM, Pall Care, Cards  Procedures  :   DVT Prophylaxis  :  Lovenox   Lab Results  Component Value Date   PLT 172 01/10/2016    Antibiotics  :    Anti-infectives    Start     Dose/Rate Route Frequency Ordered Stop   01/11/16 2000  levofloxacin (LEVAQUIN) IVPB 750 mg  Status:  Discontinued     750 mg 100 mL/hr over 90 Minutes Intravenous Every 48 hours 01/10/16 0804 01/10/16 1032   01/11/16 0100  vancomycin (VANCOCIN) IVPB 750 mg/150 ml premix     750 mg 150  mL/hr over 60 Minutes Intravenous Every 12 hours 01/10/16 2115     01/10/16 2000  levofloxacin (LEVAQUIN) IVPB 750  mg     750 mg 100 mL/hr over 90 Minutes Intravenous Every 24 hours 01/10/16 1032     01/10/16 0830  vancomycin (VANCOCIN) IVPB 750 mg/150 ml premix  Status:  Discontinued     750 mg 150 mL/hr over 60 Minutes Intravenous Every 12 hours 01/10/16 0804 01/10/16 2115   01/10/16 0830  aztreonam (AZACTAM) 2 g in dextrose 5 % 50 mL IVPB     2 g 100 mL/hr over 30 Minutes Intravenous Every 8 hours 01/10/16 0804     01/09/16 1445  vancomycin (VANCOCIN) 1,500 mg in sodium chloride 0.9 % 500 mL IVPB     1,500 mg 250 mL/hr over 120 Minutes Intravenous  Once 01/09/16 1435 01/09/16 1753   01/09/16 1445  aztreonam (AZACTAM) 2 g in dextrose 5 % 50 mL IVPB     2 g 100 mL/hr over 30 Minutes Intravenous  Once 01/09/16 1435 01/09/16 1925   01/09/16 1445  levofloxacin (LEVAQUIN) IVPB 750 mg     750 mg 100 mL/hr over 90 Minutes Intravenous  Once 01/09/16 1435 01/09/16 2111        Objective:   Filed Vitals:   01/11/16 0400 01/11/16 0500 01/11/16 0744 01/11/16 0851  BP: 118/102 136/62 94/40 94/40   Pulse: 93  89 86  Temp: 98.1 F (36.7 C)   97.6 F (36.4 C)  TempSrc: Axillary   Oral  Resp: Height:      Weight:  98.884 kg (218 lb)    SpO2: 98%  99%     Wt Readings from Last 3 Encounters:  01/11/16 98.884 kg (218 lb)  11/20/15 87 kg (191 lb 12.8 oz)  11/01/15 90.266 kg (199 lb)     Intake/Output Summary (Last 24 hours) at 01/11/16 0857 Last data filed at 01/11/16 0700  Gross per 24 hour  Intake   1053 ml  Output   2250 ml  Net  -1197 ml     Physical Exam  Morbidly obese elderly least an female lying in hospital bed, she is sleepy today,  Generalized weakness and deconditioning, No new F.N deficits, Chronic right-sided hemiparesis and right arm tremor [place].AT,PERRAL Supple Neck,No JVD, No cervical lymphadenopathy appriciated. Tracheostomy in place hooked to ventilator, Symmetrical Chest wall movement, Good air movement bilaterally, few rales RRR,No Gallops,Rubs or new Murmurs,  No Parasternal Heave +ve B.Sounds, Abd Soft, No tenderness, No organomegaly appriciated, No rebound - guarding or rigidity. PEG tube in place No Cyanosis, Clubbing or edema, No new Rash or bruise       Data Review:    CBC  Recent Labs Lab 01/09/16 1433 01/10/16 0903  WBC 7.3 7.5  HGB 8.3* 9.5*  HCT 27.6* 31.9*  PLT 150 172  MCV 90.2 90.6  MCH 27.1 27.0  MCHC 30.1 29.8*  RDW 18.0* 18.4*  LYMPHSABS 1.0  --   MONOABS 0.7  --   EOSABS 0.0  --   BASOSABS 0.0  --     Chemistries   Recent Labs Lab 01/09/16 1434 01/10/16 0213 01/10/16 0903 01/11/16 0403  NA 140  --  142 138  K 3.4*  --  4.5 3.8  CL 98*  --  99* 95*  CO2 31  --  29 30  GLUCOSE 196*  --  86 144*  BUN 70*  --  64* 65*  CREATININE 1.04*  --  1.05* 0.97  CALCIUM 8.4*  --  8.5* 8.0*  MG  --  2.0 2.0  --   AST 104*  --   --   --   ALT 103*  --   --   --   ALKPHOS 183*  --   --   --   BILITOT 0.5  --   --   --    ------------------------------------------------------------------------------------------------------------------  Recent Labs  01/10/16 0213  CHOL 213*  HDL 51  LDLCALC 129*  TRIG 166*  CHOLHDL 4.2    Lab Results  Component Value Date   HGBA1C 5.7* 01/10/2016   ------------------------------------------------------------------------------------------------------------------ No results for input(s): TSH, T4TOTAL, T3FREE, THYROIDAB in the last 72 hours.  Invalid input(s): FREET3 ------------------------------------------------------------------------------------------------------------------ No results for input(s): VITAMINB12, FOLATE, FERRITIN, TIBC, IRON, RETICCTPCT in the last 72 hours.  Coagulation profile  Recent Labs Lab 01/10/16 0213  INR 1.14    No results for input(s): DDIMER in the last 72 hours.  Cardiac Enzymes  Recent Labs Lab 01/10/16 0213 01/10/16 0727 01/10/16 1238  TROPONINI 1.06* 0.83* 0.71*    ------------------------------------------------------------------------------------------------------------------    Component Value Date/Time   BNP 3385.1* 01/09/2016 1552    Inpatient Medications  Scheduled Meds: . amLODipine  5 mg Per Tube Daily  . aspirin  162 mg Per Tube Daily  . atorvastatin  40 mg Per Tube q1800  . aztreonam  2 g Intravenous Q8H  . busPIRone  10 mg Per Tube TID  . carvedilol  6.25 mg Per Tube BID WC  . Chlorhexidine Gluconate Cloth  6 each Topical Q0600  . enoxaparin (LOVENOX) injection  40 mg Subcutaneous Q24H  . feeding supplement (VITAL HIGH PROTEIN)  1,000 mL Per Tube Q24H  . ferrous sulfate  300 mg Per Tube Daily  . folic acid  1 mg Per Tube Daily  . furosemide  60 mg Intravenous BID  . haloperidol  0.5 mg Per Tube BID  . hydrALAZINE  50 mg Per Tube 3 times per day  . insulin aspart  0-9 Units Subcutaneous TID WC  . insulin glargine  10 Units Subcutaneous QHS  . isosorbide mononitrate  15 mg Oral BID  . levofloxacin (LEVAQUIN) IV  750 mg Intravenous Q24H  . LORazepam  0.5 mg Oral QHS  . mupirocin ointment  1 application Nasal BID  . pantoprazole  40 mg Oral Daily  . polyethylene glycol  17 g Per Tube Daily  . potassium chloride  20 mEq Per Tube Once  . predniSONE  10 mg Per Tube Q breakfast  . simethicone  80 mg Per Tube BID  . sodium chloride flush  3 mL Intravenous Q12H  . vancomycin  750 mg Intravenous Q12H   Continuous Infusions:   PRN Meds:.sodium chloride, acetaminophen, albuterol, ipratropium-albuterol, ondansetron (ZOFRAN) IV, sodium chloride flush  Micro Results Recent Results (from the past 240 hour(s))  Culture, blood (routine x 2)     Status: None (Preliminary result)   Collection Time: 01/09/16  3:30 PM  Result Value Ref Range Status   Specimen Description BLOOD RIGHT FOREARM  Final   Special Requests IN PEDIATRIC BOTTLE 2CC  Final   Culture NO GROWTH < 24 HOURS  Final   Report Status PENDING  Incomplete  Culture, blood  (routine x 2)     Status: None (Preliminary result)   Collection Time: 01/09/16  3:37 PM  Result Value Ref Range Status   Specimen Description BLOOD RIGHT HAND  Final   Special Requests BOTTLES DRAWN  AEROBIC ONLY 5CC  Final   Culture NO GROWTH < 24 HOURS  Final   Report Status PENDING  Incomplete  MRSA PCR Screening     Status: Abnormal   Collection Time: 01/10/16  5:13 PM  Result Value Ref Range Status   MRSA by PCR POSITIVE (A) NEGATIVE Final    Comment:        The GeneXpert MRSA Assay (FDA approved for NASAL specimens only), is one component of a comprehensive MRSA colonization surveillance program. It is not intended to diagnose MRSA infection nor to guide or monitor treatment for MRSA infections. RESULT CALLED TO, READ BACK BY AND VERIFIED WITH: [email or web address],RN [id number] 01/10/16 M.CAMPBELL     Radiology Reports Dg Chest Port 1 View  01/09/2016  CLINICAL DATA:  Chronic respiratory failure. Bilateral pleural effusions. Fever. EXAM: PORTABLE CHEST 1 VIEW COMPARISON:  12/19/2015, 12/16/2015, 12/12/2015 and 12/02/2015 FINDINGS: Endotracheal tube remains in good position. Central line has been removed since the prior study. There is new pulmonary vascular congestion with new bilateral slight perihilar edema as well as edema at the right lung base. Haziness at the right lung base has increased since the prior study. There is chronic cardiomegaly. CABG. Aortic atherosclerosis. Chronic haziness at the left base is stable IMPRESSION: Findings consistent with congestive heart failure with new pulmonary vascular congestion and bilateral pulmonary edema. Electronically Signed   By: Francene Boyers M.D.   On: 01/09/2016 14:25    Time Spent in minutes  25   Sherese Heyward K M.D on 01/11/2016 at 8:57 AM  Between 7am to 7pm - Pager - [phone]  After 7pm go to [email or web address] - password [facility]  [facility] -  Office  [phone]

## 2016-01-11 NOTE — Consult Note (Signed)
Consultation Note Date: 01/11/2016   Patient Name: [name]  DOB: 08-20-1931  MRN: [id number]  Age / Sex: 82 y.o., female  PCP: Sheliah Hatch, MD Referring Physician: Leroy Sea, MD  Reason for Consultation: Establishing goals of care and Psychosocial/spiritual support  Clinical Assessment/Narrative:  This NP Lorinda Creed reviewed medical records, received report from team, assessed the patient and then meet at the patient's bedside along with her daughter [name]  to discuss current medical situation,  GOC,  disposition and options.  I worked with the [place] family back in February with this patient/Adenike and also with her husband when he died in the hospital several weeks ago.  This family is very clear on their goals of care, they are open to all available medical interventions to prolong life.     Family/[name] today is very upset with the care see received most recently at SNF, "she was only there four days, and look at her"    She has photos on her phone and tells me she will not be going back there.  She speaks to the family's plan to take her home, they are open learning vent care.  We dicussed the challenges of home vent care for a total care patient.  Discussed with Henrietta CM  Values and goals of care this  family have been clearly expressed and remain unchanged  Questions and concerns addressed.  Family encouraged to call with questions or concerns.  PMT will continue to support holistically.  Primary Decision Maker: Two daughters with support of the family  HCPOA: yes    SUMMARY OF RECOMMENDATIONS  Code Status/Advance Care Planning:  Full code - as previously not recommended believing the overall long term poor prognosis     Code Status Orders        Start     Ordered   01/10/16 0022  Full code   Continuous     01/10/16 0024    Code Status History    Date Active  Date Inactive Code Status Order ID Comments User Context   11/25/2015  4:15 PM 01/04/2016  6:43 PM Full Code [id number]  Dj McLaughlin Inpatient   11/08/2015  1:25 AM 11/25/2015  4:04 PM Full Code [id number]  Eduard Clos, MD Inpatient   09/01/2015  7:44 PM 09/05/2015  9:52 PM Full Code [id number]  Yevonne Pax, MD Inpatient   09/19/2011 10:19 PM 09/30/2011  9:00 PM Full Code [id number]  Valaria Good, RN Inpatient   09/12/2011  9:10 PM 09/16/2011  5:26 PM Full Code [id number]  Dorothea Ogle, MD ED      Other Directives:None   Palliative Prophylaxis:   Aspiration, Bowel Regimen, Delirium Protocol, Frequent Pain Assessment and Turn Reposition  Additional Recommendations (Limitations, Scope, Preferences):  Full Scope Treatment    Psycho-social/Spiritual:  Support System: Strong   Prognosis: Unable to determine  Discharge Planning: Home with Home Health   Chief Complaint/ Primary Diagnoses: Present on Admission:  . Type 2 diabetes mellitus with vascular disease (HCC) . Respiratory failure (HCC) . Pulmonary edema . Obesity (BMI 30-39.9) . Iron deficiency anemia . Hyperlipidemia . HCAP (healthcare-associated pneumonia) . GERD (gastroesophageal reflux disease) . Essential hypertension . Acute on chronic systolic (congestive) heart failure (HCC) . Acute encephalopathy . Elevated troponin  I have reviewed the medical record, interviewed the patient and family, and examined the patient. The following aspects are pertinent.  Past Medical History  Diagnosis Date  . Diabetes mellitus   .  Hypertension   . Stroke [facility]) Right Side Weakness  . CHF (congestive heart failure) (HCC)     2D ECHO, 09/13/2011 - EF 40-45%, normal  . 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 Pain in limb     BILATERAL EXTREMITY VENOUS DUPLEX, 01/08/2011 - no  evidence of deep vein or superficial thrombosis or Baker's cyst  . Coronary artery disease   . Renal disorder   . Chronic kidney disease (CKD), stage III (moderate)   . Anemia   . Depression   . Anxiety   . Respiratory failure (HCC)   . Cardiomyopathy (HCC)     EF 35%  . Morbid obesity (HCC)    Social History   Social History  . Marital Status: Married    Spouse Name: N/A  . Number of Children: N/A  . Years of Education: N/A   Social History Main Topics  . Smoking status: Never Smoker   . Smokeless tobacco: Never Used  . Alcohol Use: No  . Drug Use: No  . Sexual Activity: No   Other Topics Concern  . None   Social History Narrative   Family History  Problem Relation Age of Onset  . Cancer Mother     Lung  . Heart disease Brother   . Hypertension Brother   . Heart disease Brother   . Hypertension Brother    Scheduled Meds: . amLODipine  5 mg Per Tube Daily  . aspirin  162 mg Per Tube Daily  . atorvastatin  40 mg Per Tube q1800  . aztreonam  2 g Intravenous Q8H  . busPIRone  10 mg Per Tube TID  . carvedilol  6.25 mg Per Tube BID WC  . Chlorhexidine Gluconate Cloth  6 each Topical Q0600  . enoxaparin (LOVENOX) injection  40 mg Subcutaneous Q24H  . feeding supplement (VITAL HIGH PROTEIN)  1,000 mL Per Tube Q24H  . ferrous sulfate  300 mg Per Tube Daily  . folic acid  1 mg Per Tube Daily  . furosemide  60 mg Intravenous BID  . haloperidol  0.5 mg Per Tube BID  . hydrALAZINE  50 mg Per Tube 3 times per day  . insulin aspart  0-9 Units Subcutaneous TID WC  . insulin glargine  10 Units Subcutaneous QHS  . isosorbide mononitrate  15 mg Oral BID  . levofloxacin (LEVAQUIN) IV  750 mg Intravenous Q24H  . LORazepam  0.5 mg Oral QHS  . mupirocin ointment  1 application Nasal BID  . neomycin-bacitracin-polymyxin   Topical Daily  . pantoprazole  40 mg Oral Daily  . polyethylene glycol  17 g Per Tube Daily  . potassium chloride  20 mEq Per Tube Once  . predniSONE  10  mg Per Tube Q breakfast  . simethicone  80 mg Per Tube BID  . sodium chloride flush  3 mL Intravenous Q12H  . vancomycin  750 mg Intravenous Q12H   Continuous Infusions:  PRN Meds:.sodium chloride, acetaminophen, albuterol, ipratropium-albuterol, ondansetron (ZOFRAN) IV, sodium chloride flush Medications Prior to Admission:  Prior to Admission medications   Medication Sig Start Date End Date Taking? Authorizing Provider  acetaminophen (TYLENOL) 650 MG CR tablet 650 mg by PEG Tube route every 5 (five) hours as needed for fever.   Yes Historical Provider, MD  amLODipine (NORVASC) 5 MG tablet 5 mg by  PEG Tube route daily.   Yes Historical Provider, MD  busPIRone (BUSPAR) 10 MG tablet 10 mg by PEG Tube route 3 (three) times daily.   Yes Historical Provider, MD  enoxaparin (LOVENOX) 40 MG/0.4ML injection Inject 40 mg into the skin daily.   Yes Historical Provider, MD  ferrous sulfate 300 (60 Fe) MG/5ML syrup Place 300 mg into feeding tube daily.   Yes Historical Provider, MD  folic acid (FOLVITE) 1 MG tablet 1 mg by PEG Tube route daily.   Yes Historical Provider, MD  furosemide (LASIX) 40 MG tablet 40 mg by PEG Tube route 2 (two) times daily.   Yes Historical Provider, MD  haloperidol (HALDOL) 0.5 MG tablet 0.5 mg by PEG Tube route 2 (two) times daily.   Yes Historical Provider, MD  insulin glargine (LANTUS) 100 UNIT/ML injection Inject 15 Units into the skin at bedtime.   Yes Historical Provider, MD  insulin lispro (HUMALOG) 100 UNIT/ML injection Inject 2-12 Units into the skin every 6 (six) hours. Per sliding scale: if 151-200=2 units, 201-250= 4 units, 251-300=6 units, 301-350= 8 units, 351-400=10 units >400 give 12 units and call physician subcutaneously every 6 hours for T2D   Yes Historical Provider, MD  ipratropium-albuterol (DUONEB) 0.5-2.5 (3) MG/3ML SOLN Take 3 mLs by nebulization every 6 (six) hours. For trach tube   Yes Historical Provider, MD  isosorbide mononitrate (IMDUR) 15 mg TB24  24 hr tablet 15 mg by PEG Tube route 2 (two) times daily.   Yes Historical Provider, MD  LORazepam (ATIVAN) 0.5 MG tablet Take 0.5 mg by mouth at bedtime.   Yes Historical Provider, MD  pantoprazole (PROTONIX) 40 MG tablet 40 mg by PEG Tube route daily.   Yes Historical Provider, MD  polyethylene glycol (MIRALAX / GLYCOLAX) packet 17 g by PEG Tube route daily.   Yes Historical Provider, MD  predniSONE (DELTASONE) 10 MG tablet 10 mg by PEG Tube route daily with breakfast.   Yes Historical Provider, MD  PRESCRIPTION MEDICATION Medication: Lidocaine Aerosol: 2cc inhale orally via nebulizer every 6 hours as needed for cough   Yes Historical Provider, MD  simethicone (MYLICON) 80 MG chewable tablet 80 mg by PEG Tube route 2 (two) times daily.   Yes Historical Provider, MD  albuterol (PROVENTIL) (2.5 MG/3ML) 0.083% nebulizer solution Take 3 mLs (2.5 mg total) by nebulization every 6 (six) hours. Patient not taking: Reported on 01/09/2016 11/25/15   Jeanella CrazeBrandi L Ollis, NP  carvedilol (COREG) 3.125 MG tablet Take 1 tablet (3.125 mg total) by mouth 2 (two) times daily with a meal. Patient not taking: Reported on 01/09/2016 11/25/15   Jeanella CrazeBrandi L Ollis, NP  chlorhexidine gluconate (PERIDEX) 0.12 % solution 15 mLs by Mouth Rinse route 2 (two) times daily. Patient not taking: Reported on 01/09/2016 11/25/15   Jeanella CrazeBrandi L Ollis, NP  fentaNYL (SUBLIMAZE) 100 MCG/2ML injection Inject 1-4 mLs (50-200 mcg total) into the vein every 2 (two) hours as needed (breakthrough pain). Patient not taking: Reported on 01/09/2016 11/25/15   Jeanella CrazeBrandi L Ollis, NP  folic acid (FOLVITE) 1 MG tablet TAKE ONE TABLET BY MOUTH ONCE DAILY Patient not taking: Reported on 01/09/2016 09/27/15   Lennette Biharihomas A Kelly, MD  hydrALAZINE (APRESOLINE) 20 MG/ML injection Inject 0.5 mLs (10 mg total) into the vein every 4 (four) hours as needed (FOR SYSTOLIC BLOOD PRESSURE MORE THAN 160). Patient not taking: Reported on 01/09/2016 11/25/15   Jeanella CrazeBrandi L Ollis, NP  hydrALAZINE  (APRESOLINE) 25 MG tablet Take 1 tablet (25 mg  total) by mouth every 8 (eight) hours. Patient not taking: Reported on 01/09/2016 11/25/15   Jeanella Craze, NP  insulin aspart (NOVOLOG) 100 UNIT/ML injection Correction coverage: Sensitive (thin, NPO, renal) CBG < 70: implement hypoglycemia protocol CBG 70 - 120: 0 units CBG 121 - 150: 1 unit CBG 151 - 200: 2 units CBG 201 - 250: 3 units CBG 251 - 300: 5 units CBG 301 - 350: 7 units CBG 351 - 400: 9 units CBG > 400: call MD and obtain STAT lab verification Patient not taking: Reported on 01/09/2016 11/25/15   Jeanella Craze, NP  isosorbide mononitrate (ISMO,MONOKET) 10 MG tablet Take 1.5 tablets (15 mg total) by mouth 2 (two) times daily at 10 AM and 5 PM. Patient not taking: Reported on 01/09/2016 11/25/15   Jeanella Craze, NP  methylPREDNISolone sodium succinate (SOLU-MEDROL) 40 mg/mL injection Inject 1 mL (40 mg total) into the vein daily. Patient not taking: Reported on 01/09/2016 11/26/15   Jeanella Craze, NP  midazolam (VERSED) 2 MG/2ML SOLN injection Inject 1-4 mLs (1-4 mg total) into the vein every 2 (two) hours as needed for agitation or sedation. Patient not taking: Reported on 01/09/2016 11/25/15   Jeanella Craze, NP  Nutritional Supplements (FEEDING SUPPLEMENT, NEPRO CARB STEADY,) LIQD Place 1,000 mLs into feeding tube daily. Patient not taking: Reported on 01/09/2016 11/25/15   Jeanella Craze, NP  nystatin (MYCOSTATIN) 100000 UNIT/ML suspension Take 5 mLs (500,000 Units total) by mouth 4 (four) times daily. Patient not taking: Reported on 01/09/2016 11/25/15   Jeanella Craze, NP  pantoprazole (PROTONIX) 40 MG injection Inject 40 mg into the vein daily. Patient not taking: Reported on 01/09/2016 11/25/15   Jeanella Craze, NP  primidone (MYSOLINE) 50 MG tablet Take 1 tablet (50 mg total) by mouth at bedtime. Patient not taking: Reported on 01/09/2016 11/25/15   Jeanella Craze, NP  sodium chloride flush (NS) 0.9 % SOLN 10-40 mLs by Intracatheter route every  12 (twelve) hours. Patient not taking: Reported on 01/09/2016 11/25/15   Jeanella Craze, NP  sodium chloride flush (NS) 0.9 % SOLN 10-40 mLs by Intracatheter route as needed (flush). Patient not taking: Reported on 01/09/2016 11/25/15   Jeanella Craze, NP   Allergies  Allergen Reactions  . Penicillins Anaphylaxis, Itching and Swelling    Has patient had a PCN reaction causing immediate rash, facial/tongue/throat swelling, SOB or lightheadedness with hypotension: Yes Has patient had a PCN reaction causing severe rash involving mucus membranes or skin necrosis: No  Has patient had a PCN reaction that required hospitalization: Unknown Has patient had a PCN reaction occurring within the last 10 years: No  If all of the above answers are "NO", then may proceed with Cephalosporin use.     Review of Systems  Unable to perform ROS: Acuity of condition    Physical Exam  Constitutional: She appears ill.  -appears generally uncomfortable   HENT:  - noted trach site, WNL  Cardiovascular: Tachycardia present.   Respiratory: She has decreased breath sounds in the right lower field and the left lower field.  Musculoskeletal:  -generalized weakness, and atrohpy  Neurological:  - noted tremor right hand    Vital Signs: BP 155/58 mmHg  Pulse 62  Temp(Src) 97.8 F (36.6 C) (Oral)  Resp 20  Ht 5\' 4"  (1.626 m)  Wt 98.884 kg (218 lb)  BMI 37.40 kg/m2  SpO2 94%  SpO2: SpO2: 94 % O2 Device:SpO2: 94 % O2  Flow Rate: .O2 Flow Rate (L/min): 0 L/min  IO: Intake/output summary:  Intake/Output Summary (Last 24 hours) at 01/11/16 1456 Last data filed at 01/11/16 1000  Gross per 24 hour  Intake   1253 ml  Output   2250 ml  Net   -997 ml    LBM: Last BM Date: 01/11/16 Baseline Weight: Weight: 96.843 kg (213 lb 8 oz) Most recent weight: Weight: 98.884 kg (218 lb)      Palliative Assessment/Data:    Additional Data Reviewed:  CBC:    Component Value Date/Time   WBC 7.5 01/10/2016 0903    WBC 6.1 09/27/2015 1523   HGB 9.5* 01/10/2016 0903   HGB 9.8* 09/27/2015 1523   HCT 31.9* 01/10/2016 0903   HCT 32.5* 09/27/2015 1523   HCT 32.5* 09/02/2015 0715   PLT 172 01/10/2016 0903   PLT 117* 09/27/2015 1523   MCV 90.6 01/10/2016 0903   MCV 90.4 09/27/2015 1523   NEUTROABS 5.6 01/09/2016 1433   NEUTROABS 4.2 09/27/2015 1523   LYMPHSABS 1.0 01/09/2016 1433   LYMPHSABS 0.9 09/27/2015 1523   MONOABS 0.7 01/09/2016 1433   MONOABS 0.6 09/27/2015 1523   EOSABS 0.0 01/09/2016 1433   EOSABS 0.3 09/27/2015 1523   BASOSABS 0.0 01/09/2016 1433   BASOSABS 0.1 09/27/2015 1523   Comprehensive Metabolic Panel:    Component Value Date/Time   NA 138 01/11/2016 0403   NA 136 09/27/2015 1523   NA 138 02/19/2015   K 3.8 01/11/2016 0403   K 4.9 09/27/2015 1523   CL 95* 01/11/2016 0403   CO2 30 01/11/2016 0403   CO2 30* 09/27/2015 1523   BUN 65* 01/11/2016 0403   BUN 26.3* 09/27/2015 1523   BUN 26* 02/19/2015   CREATININE 0.97 01/11/2016 0403   CREATININE 0.9 09/27/2015 1523   CREATININE 0.8 02/19/2015   CREATININE 0.87 04/12/2014 1409   GLUCOSE 144* 01/11/2016 0403   GLUCOSE 134 09/27/2015 1523   CALCIUM 8.0* 01/11/2016 0403   CALCIUM 8.8 09/27/2015 1523   AST 104* 01/09/2016 1434   AST 72* 09/27/2015 1523   ALT 103* 01/09/2016 1434   ALT 32 09/27/2015 1523   ALKPHOS 183* 01/09/2016 1434   ALKPHOS 163* 09/27/2015 1523   BILITOT 0.5 01/09/2016 1434   BILITOT 0.53 09/27/2015 1523   PROT 5.3* 01/09/2016 1434   PROT 6.9 09/27/2015 1523   ALBUMIN 1.9* 01/09/2016 1434   ALBUMIN 2.9* 09/27/2015 1523   Discussed with Dr Thedore Mins and Cammie Mcgee CMRN  Time In:0900 Tme Out: 1000 Time Total: 60 minutes Greater than 50%  of this time was spent counseling and coordinating care related to the above assessment and plan.  Signed by: Lorinda Creed, NP  Canary Brim, NP  01/11/2016, 2:56 PM  Please contact Palliative Medicine Team phone at [phone] for questions and concerns.

## 2016-01-12 ENCOUNTER — Inpatient Hospital Stay (HOSPITAL_COMMUNITY): Payer: Medicare Other

## 2016-01-12 DIAGNOSIS — J9601 Acute respiratory failure with hypoxia: Secondary | ICD-10-CM

## 2016-01-12 DIAGNOSIS — I480 Paroxysmal atrial fibrillation: Secondary | ICD-10-CM

## 2016-01-12 DIAGNOSIS — I5023 Acute on chronic systolic (congestive) heart failure: Secondary | ICD-10-CM | POA: Insufficient documentation

## 2016-01-12 LAB — GLUCOSE, CAPILLARY
GLUCOSE-CAPILLARY: 179 mg/dL — AB (ref 65–99)
GLUCOSE-CAPILLARY: 141 mg/dL — AB (ref 65–99)
GLUCOSE-CAPILLARY: 207 mg/dL — AB (ref 65–99)
GLUCOSE-CAPILLARY: 184 mg/dL — AB (ref 65–99)
GLUCOSE-CAPILLARY: 108 mg/dL — AB (ref 65–99)
Glucose-Capillary: 120 mg/dL — ABNORMAL HIGH (ref 65–99)
Glucose-Capillary: 125 mg/dL — ABNORMAL HIGH (ref 65–99)
Glucose-Capillary: 206 mg/dL — ABNORMAL HIGH (ref 65–99)

## 2016-01-12 LAB — BASIC METABOLIC PANEL
ANION GAP: 14 (ref 5–15)
BUN: 67 mg/dL — ABNORMAL HIGH (ref 6–20)
CO2: 30 mmol/L (ref 22–32)
Calcium: 7.6 mg/dL — ABNORMAL LOW (ref 8.9–10.3)
Chloride: 95 mmol/L — ABNORMAL LOW (ref 101–111)
Creatinine, Ser: 1.13 mg/dL — ABNORMAL HIGH (ref 0.44–1.00)
GFR, EST AFRICAN AMERICAN: 51 mL/min — AB (ref >60)
GFR, EST NON AFRICAN AMERICAN: 44 mL/min — AB (ref >60)
GLUCOSE: 139 mg/dL — AB (ref 65–99)
POTASSIUM: 4 mmol/L (ref 3.5–5.1)
Sodium: 139 mmol/L (ref 135–145)

## 2016-01-12 LAB — CBC
HEMATOCRIT: 26.4 % — AB (ref 36.0–46.0)
HEMOGLOBIN: 7.9 g/dL — AB (ref 12.0–15.0)
MCH: 26.8 pg (ref 26.0–34.0)
MCHC: 29.9 g/dL — AB (ref 30.0–36.0)
MCV: 89.5 fL (ref 78.0–100.0)
Platelets: 169 10*3/uL (ref 150–400)
RBC: 2.95 MIL/uL — AB (ref 3.87–5.11)
RDW: 18.1 % — ABNORMAL HIGH (ref 11.5–15.5)
WBC: 6.7 10*3/uL (ref 4.0–10.5)

## 2016-01-12 MED ORDER — LEVOFLOXACIN IN D5W 750 MG/150ML IV SOLN: 750.0000 mg | INTRAVENOUS | Status: DC

## 2016-01-12 MED ORDER — AMIODARONE HCL 200 MG PO TABS
200.0000 mg | ORAL_TABLET | Freq: Two times a day (BID) | ORAL | Status: DC
  Administered 2016-01-12 – 2016-01-13 (×3): 200 mg
  Filled 2016-01-12 (×3): qty 1

## 2016-01-12 MED ORDER — WHITE PETROLATUM GEL
Status: AC
  Administered 2016-01-12: 23:00:00
  Filled 2016-01-12: qty 1

## 2016-01-12 MED ORDER — PREDNISONE 10 MG PO TABS
5.0000 mg | ORAL_TABLET | Freq: Every day | ORAL | Status: DC
  Administered 2016-01-13: 5 mg
  Filled 2016-01-12: qty 1

## 2016-01-12 MED ORDER — AMIODARONE HCL 200 MG PO TABS: 200.0000 mg | ORAL_TABLET | Freq: Every day | ORAL | Status: DC

## 2016-01-12 MED ORDER — SIMETHICONE 80 MG PO CHEW
80.0000 mg | CHEWABLE_TABLET | Freq: Four times a day (QID) | ORAL | Status: DC | PRN
  Administered 2016-01-12: 80 mg via ORAL

## 2016-01-12 MED ORDER — FUROSEMIDE 10 MG/ML IJ SOLN
80.0000 mg | Freq: Two times a day (BID) | INTRAMUSCULAR | Status: DC
  Administered 2016-01-12 – 2016-01-13 (×2): 80 mg via INTRAVENOUS
  Filled 2016-01-12 (×2): qty 8

## 2016-01-12 NOTE — Plan of Care (Signed)
Problem: Education: Goal: Ability to verbalize understanding of medication therapies will improve Outcome: Not Progressing Variance: Physical/mental limitations     Problem: Health Behavior/Discharge Planning: Goal: Ability to manage health-related needs will improve for discharge Outcome: Not Progressing Variance: Physical/mental limitations

## 2016-01-12 NOTE — Progress Notes (Signed)
PROGRESS NOTE                                                                                                                                                                                                             Patient Demographics:    [name], is a 82 y.o. female, DOB - 1930-11-11, HUD:[id number]  Admit date - 01/09/2016   Admitting Physician Lorretta Harp, MD  Outpatient Primary MD for the patient is [name], MD  LOS - 3  Outpatient Specialists:   Chief Complaint  Patient presents with  . Abdominal Pain       Brief Narrative    [name] is a 82 y.o. 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  Patient was discharged from [facility] on 3/3 , and sent to [facility] from 3/3 to 4/11 . She is on PEG tube. Given patient's functional deficits, multiple comorbidities, high risk for decompensation, she was transfered to [facility]. Pt is non-verbal. Per family, pt has fever of 100.8 and increased trach secretions today. Patient has tachycardia with heart rate are up to 135. She has worsening bilateral leg edema. Her daughters do not think pt is getting any diuretics at [facility]. Per her daughters, patient does not seem to have nausea, vomiting, diarrhea or abdominal pain. Not sure whether patient has chest pain or symptoms of UTI. Pt moves all extremeities. Per daughters, pt had a CT abd done in [facility], which showed bilateral pleural effusions, consolidation in lower lobes, and abdominal and pelvic ascites, but was not started with abx since she did no have fever then. Of note, patient is currently  being weaned off of the vent, and she was weaned on Friday for 3.5 hours.   In ED, patient was found to have lactate of 1.15, troponin 1.23, lipase 42, BNP 3385, WBC 7.3, oxygen saturation 95 200%, potassium 3.4, creatinine 1.04. Chest x-ray showed findings consistent with congestive heart failure with new pulmonary vascular congestion and bilateral pulmonary edema. ABG shows pH of 7.52, pCO2 of 43, pO2 of 74, bicarb of 35. Pt is admitted to inpatient  for further evaluation treatment. PCCM was consulted.    Subjective:    [name] today has, No headache, No chest pain, No abdominal pain - No Nausea, No new weakness tingling or numbness, No Cough - but +ve SOB. She nods her head to questions helped by her daughter.   Assessment  & Plan :    1. Acute on chronic hypoxic respiratory failure in a patient whose trach and vent dependent due to acute on chronic systolic heart failure EF 35-40% on a recent echogram - pulmonary and cardiology following, she is currently on IV Lasix, Imdur, beta blocker, hydralazine, continue ventilator support which is being monitored by pulmonary, cards on the case too. Diurese and monitor.  2. Acute on chronic systolic CHF with elevated troponin & with history of CAD status post CABG in the past. Troponin kinetics are suggestive of demand ischemia from CHF, not in ACS pattern, chest pain-free, EKG nonacute, cardiology on board, she is not a Candidate, continue aspirin, statin and beta blocker for secondary prevention. Recent echogram noted with EF 35% and some akinesis.  3. Questionable ventilator associated pneumonia. Had low-grade fever, for now empiric IV antibiotics and follow cultures. Flu PCR -ve. Clinically better and less likely pneumonia as tracheal secretions are minimal, she is afebrile, no leukocytosis. We will stop all antibiotics on 01/12/2016.  4. GERD. On PPI.  5. Essential hypertension. On combination of Norvasc, Imdur, hydralazine and  diuretic.  6. Morbid obesity with generalized deconditioning and bedbound status. Long-term prognosis poor, follow with PCP for weight loss, PT eval, social work input for placement at SNF worse his LTAC was his home with home health PT and ventilator.  7. Depression and anxiety. Continue home medications.  8. Dysphagia due to tracheostomy. All feeding and medications via PEG tube.  9. Pressure ulcers, small ulcer on the tracheostomy site. Kindly see nursing notes. Wound care consulted.  10. Chronic prednisone use. Has been on it for a few months. Per family placed by pulmonary, I appreciate no wheezing, we'll start to taper off over the next 1-2 weeks.  11. H/O Iron def Anemia and multiple transfusions - stable anemia panel, will continue to monitor, in the setting of recurrent CHF decompensation will try to keep her hemoglobin around 8.   12. H/O CVA with R.Hemiperesis - ASA- Statin, PT.  13. New diagnosis of his mental atrial fibrillation with RVR on 01/11/2016 [place] 2 score of greater than 5. This happened while patient was being repositioned, she also became hypoxic during this episode, she likely had a mucous plug which moved during this process causing hypoxia and internal A. fib with RVR. She was seen by cardiology, stabilized on IV amiodarone drip along with Lopressor via PEG tube, will defer anticoagulation to cardiology. Goal will be rate controlled.  14. DM type II. Recent A1c was 5.6, continue Lantus and scale. On PEG tube feeds.  Lab Results  Component Value Date   HGBA1C 5.7* 01/10/2016   CBG (last 3)   Recent Labs  01/11/16 1959 01/12/16 0027 01/12/16 0426  GLUCAP 211* 206* 125*      Code Status : Full  Family Communication  : daughters  Disposition Plan  : TBD  Barriers For Discharge : Resp failure  Consults  :  PCCM, Pall Care, Cards  Procedures  :   DVT Prophylaxis  :  Lovenox   Lab Results  Component Value Date   PLT 169 01/12/2016     Antibiotics  :  Anti-infectives    Start     Dose/Rate Route Frequency Ordered Stop   01/11/16 2000  levofloxacin (LEVAQUIN) IVPB 750 mg  Status:  Discontinued     750 mg 100 mL/hr over 90 Minutes Intravenous Every 48 hours 01/10/16 0804 01/10/16 1032   01/11/16 0100  vancomycin (VANCOCIN) IVPB 750 mg/150 ml premix     750 mg 150 mL/hr over 60 Minutes Intravenous Every 12 hours 01/10/16 2115     01/10/16 2000  levofloxacin (LEVAQUIN) IVPB 750 mg     750 mg 100 mL/hr over 90 Minutes Intravenous Every 24 hours 01/10/16 1032     01/10/16 0830  vancomycin (VANCOCIN) IVPB 750 mg/150 ml premix  Status:  Discontinued     750 mg 150 mL/hr over 60 Minutes Intravenous Every 12 hours 01/10/16 0804 01/10/16 2115   01/10/16 0830  aztreonam (AZACTAM) 2 g in dextrose 5 % 50 mL IVPB     2 g 100 mL/hr over 30 Minutes Intravenous Every 8 hours 01/10/16 0804     01/09/16 1445  vancomycin (VANCOCIN) 1,500 mg in sodium chloride 0.9 % 500 mL IVPB     1,500 mg 250 mL/hr over 120 Minutes Intravenous  Once 01/09/16 1435 01/09/16 1753   01/09/16 1445  aztreonam (AZACTAM) 2 g in dextrose 5 % 50 mL IVPB     2 g 100 mL/hr over 30 Minutes Intravenous  Once 01/09/16 1435 01/09/16 1925   01/09/16 1445  levofloxacin (LEVAQUIN) IVPB 750 mg     750 mg 100 mL/hr over 90 Minutes Intravenous  Once 01/09/16 1435 01/09/16 2111        Objective:   Filed Vitals:   01/12/16 0428 01/12/16 0600 01/12/16 0755 01/12/16 0921  BP:  123/65 133/90 128/56  Pulse:  50  80  Temp: 97.6 F (36.4 C)  97.9 F (36.6 C)   TempSrc: Oral  Axillary   Resp:  21  25  Height:      Weight:  99.791 kg (220 lb)    SpO2:  99%  100%    Wt Readings from Last 3 Encounters:  01/12/16 99.791 kg (220 lb)  11/20/15 87 kg (191 lb 12.8 oz)  11/01/15 90.266 kg (199 lb)     Intake/Output Summary (Last 24 hours) at 01/12/16 8119 Last data filed at 01/12/16 0600  Gross per 24 hour  Intake 943.95 ml  Output   1400 ml  Net -456.05 ml      Physical Exam  Morbidly obese elderly least an female lying in hospital bed, she is sleepy today,  Generalized weakness and deconditioning, No new F.N deficits, Chronic right-sided hemiparesis and right arm tremor [place].AT,PERRAL Supple Neck,No JVD, No cervical lymphadenopathy appriciated. Tracheostomy in place hooked to ventilator, Symmetrical Chest wall movement, Good air movement bilaterally, few rales RRR,No Gallops,Rubs or new Murmurs, No Parasternal Heave +ve B.Sounds, Abd Soft, No tenderness, No organomegaly appriciated, No rebound - guarding or rigidity. PEG tube in place No Cyanosis, Clubbing or edema, No new Rash or bruise       Data Review:    CBC  Recent Labs Lab 01/09/16 1433 01/10/16 0903 01/12/16 0337  WBC 7.3 7.5 6.7  HGB 8.3* 9.5* 7.9*  HCT 27.6* 31.9* 26.4*  PLT 150 172 169  MCV 90.2 90.6 89.5  MCH 27.1 27.0 26.8  MCHC 30.1 29.8* 29.9*  RDW 18.0* 18.4* 18.1*  LYMPHSABS 1.0  --   --   MONOABS 0.7  --   --   EOSABS  0.0  --   --   BASOSABS 0.0  --   --     Chemistries   Recent Labs Lab 01/09/16 1434 01/10/16 0213 01/10/16 0903 01/11/16 0403 01/12/16 0337  NA 140  --  142 138 139  K 3.4*  --  4.5 3.8 4.0  CL 98*  --  99* 95* 95*  CO2 31  --  GLUCOSE 196*  --  86 144* 139*  BUN 70*  --  64* 65* 67*  CREATININE 1.04*  --  1.05* 0.97 1.13*  CALCIUM 8.4*  --  8.5* 8.0* 7.6*  MG  --  2.0 2.0  --   --   AST 104*  --   --   --   --   ALT 103*  --   --   --   --   ALKPHOS 183*  --   --   --   --   BILITOT 0.5  --   --   --   --    ------------------------------------------------------------------------------------------------------------------  Recent Labs  01/10/16 0213  CHOL 213*  HDL 51  LDLCALC 129*  TRIG 166*  CHOLHDL 4.2    Lab Results  Component Value Date   HGBA1C 5.7* 01/10/2016   ------------------------------------------------------------------------------------------------------------------ No results for  input(s): TSH, T4TOTAL, T3FREE, THYROIDAB in the last 72 hours.  Invalid input(s): FREET3 ------------------------------------------------------------------------------------------------------------------  Recent Labs  01/11/16 1019  VITAMINB12 1038*  FOLATE 79.8  FERRITIN 102  TIBC 228*  IRON 50  RETICCTPCT 3.8*    Coagulation profile  Recent Labs Lab 01/10/16 0213  INR 1.14    No results for input(s): DDIMER in the last 72 hours.  Cardiac Enzymes  Recent Labs Lab 01/10/16 0213 01/10/16 0727 01/10/16 1238  TROPONINI 1.06* 0.83* 0.71*   ------------------------------------------------------------------------------------------------------------------    Component Value Date/Time   BNP 3385.1* 01/09/2016 1552    Inpatient Medications  Scheduled Meds: . amiodarone  150 mg Intravenous Once  . aspirin  162 mg Per Tube Daily  . atorvastatin  40 mg Per Tube q1800  . aztreonam  2 g Intravenous Q8H  . busPIRone  10 mg Per Tube TID  . carvedilol  6.25 mg Per Tube BID WC  . Chlorhexidine Gluconate Cloth  6 each Topical Q0600  . enoxaparin (LOVENOX) injection  40 mg Subcutaneous Q24H  . feeding supplement (VITAL HIGH PROTEIN)  1,000 mL Per Tube Q24H  . ferrous sulfate  300 mg Per Tube Daily  . folic acid  1 mg Per Tube Daily  . furosemide  60 mg Intravenous BID  . haloperidol  0.5 mg Per Tube BID  . hydrALAZINE  50 mg Per Tube 3 times per day  . insulin aspart  0-9 Units Subcutaneous TID WC  . insulin glargine  10 Units Subcutaneous QHS  . isosorbide mononitrate  15 mg Oral BID  . levofloxacin (LEVAQUIN) IV  750 mg Intravenous Q24H  . LORazepam  0.5 mg Oral QHS  . mupirocin ointment  1 application Nasal BID  . neomycin-bacitracin-polymyxin   Topical Daily  . pantoprazole  40 mg Oral Daily  . polyethylene glycol  17 g Per Tube Daily  . potassium chloride  20 mEq Per Tube Once  . predniSONE  10 mg Per Tube Q breakfast  . simethicone  80 mg Per Tube BID  .  sodium chloride flush  3 mL Intravenous Q12H  . vancomycin  750 mg Intravenous Q12H   Continuous Infusions: . amiodarone  30 mg/hr (01/11/16 2316)  . diltiazem (CARDIZEM) infusion Stopped (01/12/16 0000)   PRN Meds:.sodium chloride, acetaminophen, albuterol, ipratropium-albuterol, ondansetron (ZOFRAN) IV, sodium chloride flush  Micro Results Recent Results (from the past 240 hour(s))  Culture, blood (routine x 2)     Status: None (Preliminary result)   Collection Time: 01/09/16  3:30 PM  Result Value Ref Range Status   Specimen Description BLOOD RIGHT FOREARM  Final   Special Requests IN PEDIATRIC BOTTLE 2CC  Final   Culture NO GROWTH 2 DAYS  Final   Report Status PENDING  Incomplete  Culture, blood (routine x 2)     Status: None (Preliminary result)   Collection Time: 01/09/16  3:37 PM  Result Value Ref Range Status   Specimen Description BLOOD RIGHT HAND  Final   Special Requests BOTTLES DRAWN AEROBIC ONLY 5CC  Final   Culture NO GROWTH 2 DAYS  Final   Report Status PENDING  Incomplete  Respiratory virus panel     Status: None   Collection Time: 01/10/16  3:45 AM  Result Value Ref Range Status   Source - RVPAN NASAL SWAB  Corrected   Respiratory Syncytial Virus A Negative Negative Final   Respiratory Syncytial Virus B Negative Negative Final   Influenza A Negative Negative Final   Influenza B Negative Negative Final   Parainfluenza 1 Negative Negative Final   Parainfluenza 2 Negative Negative Final   Parainfluenza 3 Negative Negative Final   Metapneumovirus Negative Negative Final   Rhinovirus Negative Negative Final   Adenovirus Negative Negative Final    Comment: (NOTE) Performed At: [facility] [address] [place], [place] [id number] Mila Homer MD WU:[id number]   MRSA PCR Screening     Status: Abnormal   Collection Time: 01/10/16  5:13 PM  Result Value Ref Range Status   MRSA by PCR POSITIVE (A) NEGATIVE Final    Comment:        The GeneXpert  MRSA Assay (FDA approved for NASAL specimens only), is one component of a comprehensive MRSA colonization surveillance program. It is not intended to diagnose MRSA infection nor to guide or monitor treatment for MRSA infections. RESULT CALLED TO, READ BACK BY AND VERIFIED WITH: K.[email or web address],RN 2118 01/10/16 M.CAMPBELL     Radiology Reports Dg Chest Port 1 View  01/09/2016  CLINICAL DATA:  Chronic respiratory failure. Bilateral pleural effusions. Fever. EXAM: PORTABLE CHEST 1 VIEW COMPARISON:  12/19/2015, 12/16/2015, 12/12/2015 and 12/02/2015 FINDINGS: Endotracheal tube remains in good position. Central line has been removed since the prior study. There is new pulmonary vascular congestion with new bilateral slight perihilar edema as well as edema at the right lung base. Haziness at the right lung base has increased since the prior study. There is chronic cardiomegaly. CABG. Aortic atherosclerosis. Chronic haziness at the left base is stable IMPRESSION: Findings consistent with congestive heart failure with new pulmonary vascular congestion and bilateral pulmonary edema. Electronically Signed   By: Francene Boyers M.D.   On: 01/09/2016 14:25    Time Spent in minutes  25   SINGH,PRASHANT K M.D on 01/12/2016 at 9:52 AM  Between 7am to 7pm - Pager - [phone]  After 7pm go to [email or web address] - password [facility]  [facility] -  Office  [phone]

## 2016-01-12 NOTE — Progress Notes (Signed)
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   PCCM is consulted for vent management. She is being admitted by IM.   VITAL SIGNS: BP 112/56 mmHg  Pulse 82  Temp(Src) 98.4 F (36.9 C) (Axillary)  Resp 16  Ht  (1.626 m)  Wt 220 lb (99.791 kg)  BMI 37.74 kg/m2  SpO2 99%  HEMODYNAMICS:    VENTILATOR SETTINGS: Vent Mode:  [-] PRVC FiO2 (%):  [30 %-50 %] 40 % Set Rate:  [18 bmp] 18 bmp Vt Set:  [380 mL] 380 mL PEEP:  [5 cmH20] 5 cmH20 Plateau Pressure:  [20 cmH20-27 cmH20] 25 cmH20  INTAKE / OUTPUT: I/O last 3 completed shifts: In: 2047 [I.V.:147; Other:150; NG/GT:1250; IV Piggyback:500] Out: 2875 [Urine:2875]  PHYSICAL EXAMINATION: General: no distress on full vent support  Neuro:  Alert, on vent, PERRLA HEENT:  NCAT, mmm, no pharyngeal erythema Cardiovascular: tachycardic to 100s Lungs:  Diffuse crackles  Abdomen:  Obese, +BS Extremities: 3+ pitting edema to the upper thigh, right inner knee has post-surgical scar  LABS:  BMET  Recent Labs Lab 01/10/16 0903 01/11/16 0403 01/12/16 0337  NA 142 138 139  K 4.5 3.8 4.0  CL 99* 95* 95*  CO2 BUN 64* 65* 67*  CREATININE 1.05* 0.97 1.13*  GLUCOSE 86 144* 139*    Electrolytes  Recent Labs Lab 01/10/16 0213 01/10/16 0903 01/11/16 0403 01/12/16 0337  CALCIUM  --  8.5* 8.0* 7.6*  MG 2.0 2.0  --   --     CBC  Recent Labs Lab 01/09/16 1433 01/10/16 0903 01/12/16 0337  WBC 7.3 7.5 6.7  HGB 8.3* 9.5* 7.9*  HCT 27.6* 31.9* 26.4*  PLT 150 172 169    Coag's  Recent Labs Lab 01/10/16 0213  APTT 38*  INR 1.14     Sepsis Markers  Recent Labs Lab 01/09/16 1434  LATICACIDVEN 1.15    ABG  Recent Labs Lab 01/09/16 1405  PHART 7.527*  PCO2ART 43.0  PO2ART 74.0*    Liver Enzymes  Recent Labs Lab 01/09/16 1434  AST 104*  ALT 103*  ALKPHOS 183*  BILITOT 0.5  ALBUMIN 1.9*    Cardiac Enzymes  Recent Labs Lab 01/10/16 0213 01/10/16 0727 01/10/16 1238  TROPONINI 1.06* 0.83* 0.71*    Glucose  Recent Labs Lab 01/11/16 1248 01/11/16 1715 01/11/16 1959 01/12/16 0027 01/12/16 0426 01/12/16 0754  GLUCAP 206* 177* 211* 206* 125* 141*    Imaging Dg Chest Port 1 View  01/12/2016  CLINICAL DATA:  Shortness of breath EXAM: PORTABLE CHEST 1 VIEW COMPARISON:  01/11/2016 FINDINGS: Patient is status post previous CABG. Cardiac silhouette enlargement is stable. Unchanged position of tracheostomy tube noted. There is mild vascular congestion. Perihilar vessels are indistinct. There are mild interstitial opacities and there is also hazy patchy opacity in the bilateral central lung zones. Small bilateral pleural effusions left greater than right stable. IMPRESSION: No significant change from prior study with findings suggesting pulmonary edema related to congestive Electronically Signed   By: Esperanza Heir M.D.   On: 01/12/2016 07:08   Dg Chest [facility]  1 View  01/11/2016  CLINICAL DATA:  82 year old female with history of hypoxia. Diabetes and hypertension. EXAM: PORTABLE CHEST 1 VIEW COMPARISON:  Chest x-ray 01/11/2016. FINDINGS: A tracheostomy tube is in place with tip 6.2 cm above the carina. Status post median sternotomy for CABG, including LIMA. Lung volumes are low. There is cephalization of the pulmonary vasculature, indistinctness of the interstitial markings, and patchy airspace disease throughout the lungs bilaterally suggestive of moderate pulmonary edema. Small bilateral pleural effusions (left greater than right). Moderate cardiomegaly. The patient is rotated to the left on today's  exam, resulting in distortion of the mediastinal contours and reduced diagnostic sensitivity and specificity for mediastinal pathology. Atherosclerosis in the thoracic aorta. IMPRESSION: 1. Support apparatus and postoperative changes, as above. 2. The appearance the chest remains most compatible with congestive heart failure, as above. There has been little improvement in aeration compared to the prior study. Electronically Signed   By: Trudie Reedaniel  Entrikin M.D.   On: 01/11/2016 18:16   Pulmonary edema/ support tubes in satisfactory position  STUDIES:  CT abd pelvis 4/15 at [facility]> CXR 4/17 > pulm edema  CULTURES: Bcr 4/17 > (-) MRSA (+)  ANTIBIOTICS: Aztreonam 4/17> Levofloxacin 4/17> Vanc 4/17>   SIGNIFICANT EVENTS: 4/17 admit from [facility] for SOB  LINES/TUBES:   82 year old female well known to PCCM service who has a chronic trach s/p CVA. Patient is morbidly obese and is vent dependent in vent SNF. She has what looks like a significant underlying metabolic alkalosis. Suspect that this is from a mix of diuretics as well as chronic respiratory acidosis during weaning efforts. Doubt she is a candidate for anything more than short intervals off mechanical ventilation at best; and more likely not weanable at all.    Acute on Chronic respiratory failure, hypoxemic, vent/trach dependent in setting of OHS/OSA, severe deconditioning, malnutrition and h/o CVA: - Continue current vent settings. No weaning. Has been on the vent 24/7 x 2 mos.  - Titrate O2 for sat of 88-92%.  Acute pulmonary edema: due to CHF. - Diureses as BP allows.   Acute on chronic CHF Baseline systolic CM w: EF 35-40% - Diureses. - Lipitor.  Suggest to deescalate abx as cultures have been (-).  Anticipate pt to be transferred to [facility] in am. PCCM to sign off for now. Call back if with other questions or issues.   Pollie MeyerJ. Angelo A de Dios,  MD 01/12/2016, 1:07 PM [facility] Pager [phone] After 3 pm or if no answer, call [phone]

## 2016-01-12 NOTE — Progress Notes (Addendum)
Patient Name: [name] Date of Encounter: 01/12/2016  Principal Problem:   Acute on chronic systolic (congestive) heart failure (HCC) Active Problems:   Pulmonary edema   GERD (gastroesophageal reflux disease)   Iron deficiency anemia   Hyperlipidemia   Essential hypertension   HCAP (healthcare-associated pneumonia)   Obesity (BMI 30-39.9)   Hx of CABG-'00   Acute encephalopathy   Type 2 diabetes mellitus with vascular disease (HCC)   PEG (percutaneous endoscopic gastrostomy) adjustment/replacement/removal (HCC)   Respiratory failure (HCC)   CHF exacerbation (HCC)   Elevated troponin   Mitral insufficiency   Acute hypoxemic respiratory failure (HCC)   Length of Stay: 3  SUBJECTIVE  Awake. No distress. Family not at bedside. AFib resolved at 2000h yesterday, good rate control, hemodynamically stable.  CURRENT MEDS . amiodarone  150 mg Intravenous Once  . aspirin  162 mg Per Tube Daily  . atorvastatin  40 mg Per Tube q1800  . aztreonam  2 g Intravenous Q8H  . busPIRone  10 mg Per Tube TID  . carvedilol  6.25 mg Per Tube BID WC  . Chlorhexidine Gluconate Cloth  6 each Topical Q0600  . enoxaparin (LOVENOX) injection  40 mg Subcutaneous Q24H  . feeding supplement (VITAL HIGH PROTEIN)  1,000 mL Per Tube Q24H  . ferrous sulfate  300 mg Per Tube Daily  . folic acid  1 mg Per Tube Daily  . furosemide  80 mg Intravenous BID  . haloperidol  0.5 mg Per Tube BID  . hydrALAZINE  50 mg Per Tube 3 times per day  . insulin aspart  0-9 Units Subcutaneous TID WC  . insulin glargine  10 Units Subcutaneous QHS  . isosorbide mononitrate  15 mg Oral BID  . levofloxacin (LEVAQUIN) IV  750 mg Intravenous Q24H  . LORazepam  0.5 mg Oral QHS  . mupirocin ointment  1 application Nasal BID  . neomycin-bacitracin-polymyxin   Topical Daily  . pantoprazole  40 mg Oral Daily  . polyethylene glycol  17 g Per Tube Daily  . potassium chloride  20 mEq Per Tube Once  . predniSONE  10 mg Per  Tube Q breakfast  . simethicone  80 mg Per Tube BID  . sodium chloride flush  3 mL Intravenous Q12H  . vancomycin  750 mg Intravenous Q12H    OBJECTIVE   Intake/Output Summary (Last 24 hours) at 01/12/16 1313 Last data filed at 01/12/16 0600  Gross per 24 hour  Intake 893.95 ml  Output   1400 ml  Net -506.05 ml   Filed Weights   01/10/16 1810 01/11/16 0500 01/12/16 0600  Weight: 96.843 kg (213 lb 8 oz) 98.884 kg (218 lb) 99.791 kg (220 lb)    PHYSICAL EXAM Filed Vitals:   01/12/16 0755 01/12/16 0921 01/12/16 1012 01/12/16 1235  BP: 133/90 128/56  112/56  Pulse:  80  82  Temp: 97.9 F (36.6 C)   98.4 F (36.9 C)  TempSrc: Axillary   Axillary  Resp:  25  16  Height:      Weight:      SpO2:  100% 99% 99%   General: Alert, trach, no distress Head: no evidence of trauma, PERRL, EOMI, no exophtalmos or lid lag, no myxedema, no xanthelasma; normal ears, nose and oropharynx Neck: healthy trach site, unable to evaluate jugular venous pulsations or hepatojugular reflux; brisk carotid pulses without delay and no carotid bruits Chest: clear to auscultation, no signs of consolidation by percussion or palpation, normal  fremitus, symmetrical and full respiratory excursions Cardiovascular: normal position and quality of the apical impulse, regular rhythm, normal first and second heart sounds, no rubs or gallops, 2/6 holosystolic apical murmur Abdomen: no tenderness or distention, no masses by palpation, no abnormal pulsatility or arterial bruits, normal bowel sounds, no hepatosplenomegaly Extremities: no clubbing, cyanosis or edema; 2+ radial, ulnar and brachial pulses bilaterally; 2+ right femoral, posterior tibial and dorsalis pedis pulses; 2+ left femoral, posterior tibial and dorsalis pedis pulses; no subclavian or femoral bruits Neurological: grossly nonfocal  LABS  CBC  Recent Labs  01/09/16 1433 01/10/16 0903 01/12/16 0337  WBC 7.3 7.5 6.7  NEUTROABS 5.6  --   --   HGB  8.3* 9.5* 7.9*  HCT 27.6* 31.9* 26.4*  MCV 90.2 90.6 89.5  PLT 150 172 169   Basic Metabolic Panel  Recent Labs  01/10/16 0213 01/10/16 0903 01/11/16 0403 01/12/16 0337  NA  --  142 138 139  K  --  4.5 3.8 4.0  CL  --  99* 95* 95*  CO2  --  29 30 30   GLUCOSE  --  86 144* 139*  BUN  --  64* 65* 67*  CREATININE  --  1.05* 0.97 1.13*  CALCIUM  --  8.5* 8.0* 7.6*  MG 2.0 2.0  --   --    Liver Function Tests  Recent Labs  01/09/16 1434  AST 104*  ALT 103*  ALKPHOS 183*  BILITOT 0.5  PROT 5.3*  ALBUMIN 1.9*    Recent Labs  01/09/16 1434  LIPASE 42   Cardiac Enzymes  Recent Labs  01/10/16 0213 01/10/16 0727 01/10/16 1238  TROPONINI 1.06* 0.83* 0.71*   BNP Invalid input(s): POCBNP D-Dimer No results for input(s): DDIMER in the last 72 hours. Hemoglobin A1C  Recent Labs  01/10/16 0213  HGBA1C 5.7*   Fasting Lipid Panel  Recent Labs  01/10/16 0213  CHOL 213*  HDL 51  LDLCALC 129*  TRIG 166*  CHOLHDL 4.2   Thyroid Function Tests No results for input(s): TSH, T4TOTAL, T3FREE, THYROIDAB in the last 72 hours.  Invalid input(s): FREET3  Radiology Studies Imaging results have been reviewed and Dg Chest Port 1 View  01/12/2016  CLINICAL DATA:  Shortness of breath EXAM: PORTABLE CHEST 1 VIEW COMPARISON:  01/11/2016 FINDINGS: Patient is status post previous CABG. Cardiac silhouette enlargement is stable. Unchanged position of tracheostomy tube noted. There is mild vascular congestion. Perihilar vessels are indistinct. There are mild interstitial opacities and there is also hazy patchy opacity in the bilateral central lung zones. Small bilateral pleural effusions left greater than right stable. IMPRESSION: No significant change from prior study with findings suggesting pulmonary edema related to congestive Electronically Signed   By: Esperanza Heiraymond  Rubner M.D.   On: 01/12/2016 07:08   Dg Chest Port 1 View  01/11/2016  CLINICAL DATA:  82 year old female with  history of hypoxia. Diabetes and hypertension. EXAM: PORTABLE CHEST 1 VIEW COMPARISON:  Chest x-ray 01/11/2016. FINDINGS: A tracheostomy tube is in place with tip 6.2 cm above the carina. Status post median sternotomy for CABG, including LIMA. Lung volumes are low. There is cephalization of the pulmonary vasculature, indistinctness of the interstitial markings, and patchy airspace disease throughout the lungs bilaterally suggestive of moderate pulmonary edema. Small bilateral pleural effusions (left greater than right). Moderate cardiomegaly. The patient is rotated to the left on today's exam, resulting in distortion of the mediastinal contours and reduced diagnostic sensitivity and specificity for mediastinal pathology. Atherosclerosis  in the thoracic aorta. IMPRESSION: 1. Support apparatus and postoperative changes, as above. 2. The appearance the chest remains most compatible with congestive heart failure, as above. There has been little improvement in aeration compared to the prior study. Electronically Signed   By: Trudie Reed M.D.   On: 01/11/2016 18:16   Dg Chest Port 1 View  01/11/2016  CLINICAL DATA:  Short of breath EXAM: PORTABLE CHEST 1 VIEW COMPARISON:  01/09/2016 FINDINGS: Tracheostomy remains in good position. Postop CABG. Cardiac enlargement. Vascular congestion with mild edema unchanged. Bibasilar atelectasis/ infiltrate unchanged. Small bilateral effusions unchanged. IMPRESSION: Congestive heart failure with pulmonary edema and bilateral effusions unchanged. Bibasilar atelectasis/ infiltrate also unchanged. Electronically Signed   By: Marlan Palau M.D.   On: 01/11/2016 09:35    TELE AF, rate controlled  ECHO - Left ventricle: The cavity size was normal. There was severe  focal basal and mild concentric hypertrophy. Systolic function  was moderately reduced. The estimated ejection fraction was in  the range of 35% to 40%. There is akinesis of the  mid-apicalinferoseptal  myocardium. There is akinesis of the  entireinferior myocardium. Cannot exclude akinesis of the  mid-apicalinferolateral myocardium. Features are consistent with  a pseudonormal left ventricular filling pattern, with concomitant  abnormal relaxation and increased filling pressure (grade 2  diastolic dysfunction). Doppler parameters are consistent with  high ventricular filling pressure. - Ventricular septum: Septal motion showed moderate paradox. These  changes are consistent with intraventricular conduction delay. - Mitral valve: Calcified annulus. There was moderate regurgitation  directed eccentrically and posteriorly. - Left atrium: The atrium was moderately dilated. - Pulmonary arteries: PA peak pressure: 32 mm Hg (S). - Pericardium, extracardiac: There was a left pleural effusion.  ASSESSMENT AND PLAN  1. Acute on chronic combined systolic and diastolic heart failure Mediocre diuresis so far, increase diuretics as tolerated by renal function/electrolytes  2. Atrial fibrillation, paroxysmal - new onset, not a big surprise considering LV dysfunction, MR, LA enlargement, age and comorbid conditions. Switch to PO amiodarone, probably a long term treatment. Not a good anticoagulation or cardioversion candidate.  3. Mitral insufficiency appears moderate to severe on my appraisal and has eccentric posterior jet typical of ischemic mechanism, rather than central jet of decompensated cardiomyopathy. She is a very poor candidate for surgical repair.  4. Recent pneumonia with positive Klebsiella pneumonia on sputum culture 4/6; per IM  5. Acute on chronic respiratory failure, s/p Trach 3/2 and PEG 3/14 on Vent. PCCM notes suggest that she is very unlikely to wean off vent. I don't think the patient and her family have truly understood this. They seem to have unrealistic expectations about her chances of recovery ("maybe she can eat real food?", "maybe can stay off vent during the day,  8-10 hours every day?"  6. Elevated troponin, unable to assess if this is demand ischemia versus NTSEMI as patient is nonverbal and confused. Suspect it is related to CHF rather than a new atherothrombotic event. It does signal poor prognosis. - Note recent decline in EF on echocardiogram from 50% down to 35%. Normally will require further workup, however in this case I would not recommend any further ischemic workup, as she is clearly not cath candidate. - She is currently full code as at the wish of the family, however this will need to be readdressed further with the family as she likely will have poor long-term prognosis - Continue aspirin, will hold off on adding Plavix as this time given persistent anemia and  a history of multiple transfusion  7. iron deficiency anemia requiring multiple transfusion  8. stage III CKD  9. CAD s/p CABG 02/1999 with LIMA to LAD, left radial to OM, SVG to PDA, and SVG to diagonal   Thurmon Fair, MD, [facility] [phone] office [phone] pager 01/12/2016 1:13 PM

## 2016-01-13 ENCOUNTER — Other Ambulatory Visit (HOSPITAL_COMMUNITY): Payer: Self-pay

## 2016-01-13 ENCOUNTER — Inpatient Hospital Stay
Admission: AD | Admit: 2016-01-13 | Discharge: 2016-02-23 | Disposition: E | Payer: Self-pay | Source: Ambulatory Visit | Attending: Internal Medicine | Admitting: Internal Medicine

## 2016-01-13 DIAGNOSIS — J9690 Respiratory failure, unspecified, unspecified whether with hypoxia or hypercapnia: Secondary | ICD-10-CM

## 2016-01-13 DIAGNOSIS — Z951 Presence of aortocoronary bypass graft: Secondary | ICD-10-CM | POA: Insufficient documentation

## 2016-01-13 DIAGNOSIS — J189 Pneumonia, unspecified organism: Secondary | ICD-10-CM

## 2016-01-13 DIAGNOSIS — Z4659 Encounter for fitting and adjustment of other gastrointestinal appliance and device: Secondary | ICD-10-CM

## 2016-01-13 DIAGNOSIS — I25810 Atherosclerosis of coronary artery bypass graft(s) without angina pectoris: Secondary | ICD-10-CM | POA: Insufficient documentation

## 2016-01-13 DIAGNOSIS — I255 Ischemic cardiomyopathy: Secondary | ICD-10-CM | POA: Insufficient documentation

## 2016-01-13 LAB — BASIC METABOLIC PANEL
Anion gap: 13 (ref 5–15)
BUN: 76 mg/dL — ABNORMAL HIGH (ref 6–20)
CALCIUM: 7.8 mg/dL — AB (ref 8.9–10.3)
CHLORIDE: 93 mmol/L — AB (ref 101–111)
CO2: 28 mmol/L (ref 22–32)
CREATININE: 1.21 mg/dL — AB (ref 0.44–1.00)
GFR, EST AFRICAN AMERICAN: 47 mL/min — AB (ref >60)
GFR, EST NON AFRICAN AMERICAN: 41 mL/min — AB (ref >60)
Glucose, Bld: 190 mg/dL — ABNORMAL HIGH (ref 65–99)
Potassium: 3.4 mmol/L — ABNORMAL LOW (ref 3.5–5.1)
SODIUM: 134 mmol/L — AB (ref 135–145)

## 2016-01-13 LAB — GLUCOSE, CAPILLARY
GLUCOSE-CAPILLARY: 193 mg/dL — AB (ref 65–99)
GLUCOSE-CAPILLARY: 193 mg/dL — AB (ref 65–99)
Glucose-Capillary: 128 mg/dL — ABNORMAL HIGH (ref 65–99)

## 2016-01-13 MED ORDER — AMIODARONE HCL 200 MG PO TABS: 200.0000 mg | ORAL_TABLET | Freq: Every day | ORAL | Status: AC

## 2016-01-13 MED ORDER — FUROSEMIDE 10 MG/ML IJ SOLN: 80.0000 mg | Freq: Two times a day (BID) | INTRAMUSCULAR | Status: AC

## 2016-01-13 MED ORDER — POTASSIUM CHLORIDE 20 MEQ/15ML (10%) PO SOLN: 20.0000 meq | Freq: Once | ORAL | Status: AC

## 2016-01-13 MED ORDER — AMIODARONE HCL 200 MG PO TABS: 200.0000 mg | ORAL_TABLET | Freq: Two times a day (BID) | ORAL | Status: AC

## 2016-01-13 MED ORDER — PREDNISONE 10 MG PO TABS: 10.0000 mg | ORAL_TABLET | Freq: Every day | ORAL | Status: AC

## 2016-01-13 MED ORDER — ISOSORBIDE DINITRATE 10 MG PO TABS
10.0000 mg | ORAL_TABLET | Freq: Three times a day (TID) | ORAL | Status: DC
  Administered 2016-01-13: 10 mg
  Filled 2016-01-13: qty 1

## 2016-01-13 MED ORDER — PANTOPRAZOLE SODIUM 40 MG PO PACK
40.0000 mg | PACK | Freq: Every day | ORAL | Status: DC
  Administered 2016-01-13: 40 mg
  Filled 2016-01-13: qty 20

## 2016-01-13 MED ORDER — VITAL HIGH PROTEIN PO LIQD
1000.0000 mL | ORAL | Status: DC
  Administered 2016-01-13: 1000 mL
  Filled 2016-01-13 (×2): qty 1000

## 2016-01-13 MED ORDER — LISINOPRIL 10 MG PO TABS: 10.0000 mg | ORAL_TABLET | Freq: Every day | ORAL | Status: AC

## 2016-01-13 NOTE — Care Management Important Message (Signed)
Important Message  Patient Details  Name: [name] MRN: [id number] Date of Birth: 1931/08/16   Medicare Important Message Given:  Yes    Giamarie Bueche, Chapman FitchHenrietta T, RN 01/16/2016, 10:39 AM

## 2016-01-13 NOTE — Discharge Summary (Addendum)
[name], is a 82 y.o. female  DOB 1930/11/14  MRN [id number].  Admission date:  01/09/2016  Admitting Physician  Ivor Costa, MD  Discharge Date:  01/10/2016   Primary MD  Annye Asa, MD  Recommendations for primary care physician for things to follow:   Monitor weight, BMP, diuretic and blood pressure medication dose closely.  Consult cardiology and pulmonary.   Admission Diagnosis  Elevated troponin [R79.89] Acute on chronic systolic congestive heart failure (HCC) [I50.23] Chronic respiratory failure with hypercapnia (HCC) [J96.12] HAP (hospital-acquired pneumonia) [J18.9]   Discharge Diagnosis  Elevated troponin [R79.89] Acute on chronic systolic congestive heart failure (HCC) [I50.23] Chronic respiratory failure with hypercapnia (HCC) [J96.12] HAP (hospital-acquired pneumonia) [J18.9]    Principal Problem:   Acute on chronic systolic (congestive) heart failure (HCC) Active Problems:   Pulmonary edema   GERD (gastroesophageal reflux disease)   Iron deficiency anemia   Hyperlipidemia   Essential hypertension   HCAP (healthcare-associated pneumonia)   Obesity (BMI 30-39.9)   Hx of CABG-'00   Acute encephalopathy   Type 2 diabetes mellitus with vascular disease (HCC)   PEG (percutaneous endoscopic gastrostomy) adjustment/replacement/removal (HCC)   Respiratory failure (HCC)   CHF exacerbation (HCC)   Elevated troponin   Mitral insufficiency   Acute hypoxemic respiratory failure (HCC)   Acute on chronic systolic congestive heart failure (HCC)   Paroxysmal atrial fibrillation ([place])      Past Medical History  Diagnosis Date  . Diabetes mellitus   . Hypertension   . Stroke [facility]) Right Side Weakness  . CHF (congestive heart failure) ([place])     2D ECHO, 09/13/2011 - EF 40-45%, normal   . 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 Pain in limb     BILATERAL EXTREMITY VENOUS DUPLEX, 01/08/2011 - no evidence of deep vein or superficial thrombosis or Baker's cyst  . Coronary artery disease   . Renal disorder   . Chronic kidney disease (CKD), stage III (moderate)   . Anemia   . Depression   . Anxiety   . Respiratory failure ([place])   . Cardiomyopathy ([place])     EF 35%  . Morbid obesity [facility])     Past Surgical History  Procedure Laterality Date  . Cardiac surgery    . Cardiac catheterization  10/27/2004    3 stents placed, 3.5x28 proximally,3.5x33 mid segment, and 3.5x33 in the region of the crux, stenosis being reduced to 0% at proximal and mid segment and being reduced from 80% ot 10% in the stent at the cruxed segment  . Cardiac catheterization  09/26/2004    High grade 95% ostial stenosis in the RCA with 70% mid stenosis, 95% distal stenosis and total occlusion of the PDA with collaterals  . Coronary artery bypass graft  02/24/1999    x4; IMA to distal LAD, left radial to first circ, SVG to first  diagonal, SVG to posterior descending coronary artery  . Tracheostomy tube placement N/A 11/24/2015    Procedure: TRACHEOSTOMY;  Surgeon: Jerrell Belfast, MD;  Location: [place];  Service: ENT;  Laterality: N/A;       HPI  from the history and physical done on the day of admission:    [name] is a 82 y.o. 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  Patient was discharged from [facility] on 3/3 , and sent to [facility] from 3/3 to 4/11 . She is on PEG  tube. Given patient's functional deficits, multiple comorbidities, high risk for decompensation, she was transfered to [facility]. Pt is non-verbal. Per family, pt has fever of 100.8 and increased trach secretions today. Patient has tachycardia with heart rate are up to 135. She has worsening bilateral leg edema. Her daughters do not think pt is getting any diuretics at [facility] Per her daughters, patient does not seem to have nausea, vomiting, diarrhea or abdominal pain. Not sure whether patient has chest pain or symptoms of UTI. Pt moves all extremeities. Per daughters, pt had a CT abd done in [facility], which showed bilateral pleural effusions, consolidation in lower lobes, and abdominal and pelvic ascites, but was not started with abx since she did no have fever then. Of note, patient is currently being weaned off of the vent, and she was weaned on Friday for 3.5 hours.   In ED, patient was found to have lactate of 1.15, troponin 1.23, lipase 42, BNP 3385, WBC 7.3, oxygen saturation 95 200%, potassium 3.4, creatinine 1.04. Chest x-ray showed findings consistent with congestive heart failure with new pulmonary vascular congestion and bilateral pulmonary edema. ABG shows pH of 7.52, pCO2 of 43, pO2 of 74, bicarb of 35. Pt is admitted to inpatient for further evaluation treatment. PCCM was consulted.  While at the hospital patient's main issues were fluid overload due to CHF for which she was diuresed, she also went into newly diagnosed paroxysmal atrial fibrillation with RVR for which cardiology was consulted, she initially required amiodarone drip and now stable on oral amiodarone. She needs to be further diuresed through IV Lasix for which she will go to an LTAC with continued vent support.   Note patient has been now went dependent for several months, she was bedbound with right-sided hemiparesis from a previous stroke at her baseline, her long-term prognosis is extremely  poor, extremely poor quality of life, this was shared by the family by me, cardiology and pulmonary. Palliative care also saw the patient. However family continues to desire for aggressive care. She is still full code. However her long-term prognosis remains guarded and poor.    Hospital Course:     1. Acute on chronic hypoxic respiratory failure in a patient whose trach and vent dependent due to acute on chronic systolic heart failure EF 35-40% on a recent echogram - pulmonary and cardiology following, she is currently on IV Lasix, Imdur, beta blocker, Will add low-dose ACE inhibitor instead of hydralazine, continue ventilator support . She is clinically improved, case discussed with cardiology and pulmonary, she has a bed at [facility] and will be discharged today. Currently continue to Diurese and monitor. Consult cardiology and pulmonary at [facility].   VENTILATOR SETTINGS:  Vent Mode:  [-]  PRVC FiO2 (%):  [40 %] 40 % Set Rate:  [18 bmp] 18 bmp Vt Set:  [380 mL] 380 mL PEEP:  [5 cmH20] 5 cmH20 Plateau Pressure:  [21 cmH20-27 cmH20] 22 cmH20   2. Acute on chronic systolic CHF with elevated troponin & with history of CAD status post CABG in the past. Troponin kinetics are suggestive of demand ischemia from CHF, not in ACS pattern, chest pain-free, EKG nonacute, cardiology was on board, she is not a Candidate, continue aspirin, statin and beta blocker for secondary prevention. Recent echogram noted with EF 35% and some akinesis. Please continue IV Lasix with potassium supplementation, monitor BMP in diuretic dose. Continue to involve cardiology.  3. Questionable ventilator associated pneumonia. Had low-grade fever, for now empiric IV antibiotics and follow cultures. Flu PCR -ve. Clinically better and less likely pneumonia as tracheal secretions are minimal, she is afebrile, no leukocytosis. We stopped all antibiotics on 01/12/2016.  4. GERD. On PPI.  5. Essential hypertension. Stable currently on  Coreg, Imdur and diuretic monitor blood pressure and titrate medications. Low-dose ACE inhibitor for CHF.  6. Morbid obesity with generalized deconditioning and bedbound status. Long-term prognosis poor, follow with PCP for weight loss, PT eval, social work input for placement at SNF worse his LTAC was his home with home health PT and ventilator.  7. Depression and anxiety. Continue home medications.  8. Dysphagia due to tracheostomy. All feeding and medications via PEG tube. Please obtain dietary and speech consult at [facility].  9. Pressure ulcers, small ulcer on the tracheostomy site. Kindly see nursing notes. Wound care consulted.  10. Chronic prednisone use. Has been on it for a few months. Per family placed by pulmonary, I appreciate no wheezing, we'll start to taper off over the next 1-2 weeks.She was on 10 mg of prednisone upon arrival now cut to 5, taper and discontinue over the next 1-2 weeks.  11. H/O Iron def Anemia and multiple transfusions - stable anemia panel, will continue to monitor, in the setting of recurrent CHF decompensation will try to keep her hemoglobin around 8.   12. H/O CVA with R.Hemiperesis - ASA- Statin for secondary prevention, continue supportive care with PT.  13. New diagnosis of paroxysmal atrial fibrillation with RVR on 01/11/2016 [place] vasc 2 score of greater than 5. This happened while patient was being repositioned, she also became hypoxic during this episode, she likely had a mucous plug which moved during this process causing hypoxia and internal A. fib with RVR. She was seen by cardiology, stabilized on IV amiodarone drip, discussed her case with [facility] cardiology she is stable for transitioning to amiodarone 200 mg twice a day for 7 days then 200 mg daily. Continue Coreg at home dose. No anticoagulation due to multiple needs of transfusion with occult GI bleed. Please continue to consult cardiology at [facility].  14. DM type II. Recent A1c was 5.6, continue Lantus  15 units subcutaneous daily at bedtime along with every 6 hour insulin sliding scale. Please do Accu-Cheks every 6 hours and adjust insulin as needed. On PEG tube feeds.     Follow UP  Follow-up Information    Follow up with Annye Asa, MD.   Specialty:  Family Medicine   Contact information:   [address] [place] [address] [phone]        Consults obtained - Cardiology, pulmonary, palliative care  Discharge Condition: Guarded, long-term prognosis extremely poor, poor quality of life at baseline  Diet and  Activity recommendation: See Discharge Instructions below  Discharge Instructions           Discharge Instructions    Discharge instructions    Complete by:  As directed   Follow with Primary MD Annye Asa, MD after discharge from the LTAC   Activity: As tolerated with Full fall precautions use walker/cane & assistance as needed   Disposition LTAC   Diet: All of her medications and diet through the PEG tube.  For Heart failure patients - Check your Weight same time everyday, if you gain over 2 pounds, or you develop in leg swelling, experience more shortness of breath or chest pain, call your Primary MD immediately. Follow Cardiac Low Salt Diet and 1.5 lit/day fluid restriction.  On your next visit with your primary care physician please Get Medicines reviewed and adjusted.  Please request your Prim.MD to go over all Hospital Tests and Procedure/Radiological results at the follow up, please get all Hospital records sent to your Prim MD by signing hospital release before you go home.   If you experience worsening of your admission symptoms, develop shortness of breath, life threatening emergency, suicidal or homicidal thoughts you must seek medical attention immediately by calling 911 or calling your MD immediately  if symptoms less severe.  You Must read complete instructions/literature along with all the possible adverse reactions/side  effects for all the Medicines you take and that have been prescribed to you. Take any new Medicines after you have completely understood and accpet all the possible adverse reactions/side effects.     Increase activity slowly    Complete by:  As directed              Discharge Medications       Medication List    STOP taking these medications        amLODipine 5 MG tablet  Commonly known as:  NORVASC     furosemide 40 MG tablet  Commonly known as:  LASIX  Replaced by:  furosemide 10 MG/ML injection     hydrALAZINE 20 MG/ML injection  Commonly known as:  APRESOLINE     hydrALAZINE 25 MG tablet  Commonly known as:  APRESOLINE     insulin aspart 100 UNIT/ML injection  Commonly known as:  novoLOG     methylPREDNISolone sodium succinate 40 mg/mL injection  Commonly known as:  SOLU-MEDROL     midazolam 2 MG/2ML Soln injection  Commonly known as:  VERSED     nystatin 100000 UNIT/ML suspension  Commonly known as:  MYCOSTATIN     sodium chloride flush 0.9 % Soln  Commonly known as:  NS      TAKE these medications        acetaminophen 650 MG CR tablet  Commonly known as:  TYLENOL  650 mg by PEG Tube route every 5 (five) hours as needed for fever.     albuterol (2.5 MG/3ML) 0.083% nebulizer solution  Commonly known as:  PROVENTIL  Take 3 mLs (2.5 mg total) by nebulization every 6 (six) hours.     amiodarone 200 MG tablet  Commonly known as:  PACERONE  Place 1 tablet (200 mg total) into feeding tube 2 (two) times daily.     amiodarone 200 MG tablet  Commonly known as:  PACERONE  Take 1 tablet (200 mg total) by mouth daily.  Start taking on:  01/20/2016     busPIRone 10 MG tablet  Commonly known as:  BUSPAR  10 mg by PEG Tube  route 3 (three) times daily.     carvedilol 3.125 MG tablet  Commonly known as:  COREG  Take 1 tablet (3.125 mg total) by mouth 2 (two) times daily with a meal.     chlorhexidine gluconate (SAGE KIT) 0.12 % solution  Commonly known as:   PERIDEX  15 mLs by Mouth Rinse route 2 (two) times daily.     enoxaparin 40 MG/0.4ML injection  Commonly known as:  LOVENOX  Inject 40 mg into the skin daily.     feeding supplement (NEPRO CARB STEADY) Liqd  Place 1,000 mLs into feeding tube daily.     fentaNYL 100 MCG/2ML injection  Commonly known as:  SUBLIMAZE  Inject 1-4 mLs (50-200 mcg total) into the vein every 2 (two) hours as needed (breakthrough pain).     ferrous sulfate 300 (60 Fe) MG/5ML syrup  Place 300 mg into feeding tube daily.     folic acid 1 MG tablet  Commonly known as:  FOLVITE  1 mg by PEG Tube route daily.     folic acid 1 MG tablet  Commonly known as:  FOLVITE  TAKE ONE TABLET BY MOUTH ONCE DAILY     furosemide 10 MG/ML injection  Commonly known as:  LASIX  Inject 8 mLs (80 mg total) into the vein 2 (two) times daily.     haloperidol 0.5 MG tablet  Commonly known as:  HALDOL  0.5 mg by PEG Tube route 2 (two) times daily.     insulin glargine 100 UNIT/ML injection  Commonly known as:  LANTUS  Inject 15 Units into the skin at bedtime.     insulin lispro 100 UNIT/ML injection  Commonly known as:  HUMALOG  Inject 2-12 Units into the skin every 6 (six) hours. Per sliding scale: if 151-200=2 units, 201-250= 4 units, 251-300=6 units, 301-350= 8 units, 351-400=10 units >400 give 12 units and call physician subcutaneously every 6 hours for T2D     ipratropium-albuterol 0.5-2.5 (3) MG/3ML Soln  Commonly known as:  DUONEB  Take 3 mLs by nebulization every 6 (six) hours. For trach tube     isosorbide mononitrate 15 mg Tb24 24 hr tablet  Commonly known as:  IMDUR  15 mg by PEG Tube route 2 (two) times daily.     lisinopril 10 MG tablet  Commonly known as:  PRINIVIL  Take 1 tablet (10 mg total) by mouth daily.     LORazepam 0.5 MG tablet  Commonly known as:  ATIVAN  Take 0.5 mg by mouth at bedtime.     pantoprazole 40 MG tablet  Commonly known as:  PROTONIX  40 mg by PEG Tube route daily.      pantoprazole 40 MG injection  Commonly known as:  PROTONIX  Inject 40 mg into the vein daily.     polyethylene glycol packet  Commonly known as:  MIRALAX / GLYCOLAX  17 g by PEG Tube route daily.     potassium chloride 20 MEQ/15ML (10%) Soln  Place 15 mLs (20 mEq total) into feeding tube once.     predniSONE 10 MG tablet  Commonly known as:  DELTASONE  Place 1 tablet (10 mg total) into feeding tube daily with breakfast.     PRESCRIPTION MEDICATION  Medication: Lidocaine Aerosol: 2cc inhale orally via nebulizer every 6 hours as needed for cough     primidone 50 MG tablet  Commonly known as:  MYSOLINE  Take 1 tablet (50 mg total) by mouth at bedtime.  simethicone 80 MG chewable tablet  Commonly known as:  MYLICON  80 mg by PEG Tube route 2 (two) times daily.        Major procedures and Radiology Reports - PLEASE review detailed and final reports for all details, in brief -   TTE  Left ventricle: The cavity size was normal. There was severe focal basal and mild concentric hypertrophy. Systolic function was moderately reduced. The estimated ejection fraction was in the range of 35% to 40%. There is akinesis of the mid-apicalinferoseptal myocardium. There is akinesis of the entireinferior myocardium. Cannot exclude akinesis of the mid-apicalinferolateral myocardium. Features are consistent with a pseudonormal left ventricular filling pattern, with concomitant abnormal relaxation and increased filling pressure (grade 2 diastolic dysfunction). Doppler parameters are consistent with high ventricular filling pressure. - Ventricular septum: Septal motion showed moderate paradox. These changes are consistent with intraventricular conduction delay. - Mitral valve: Calcified annulus. There was moderate regurgitation directed eccentrically and posteriorly. - Left atrium: The atrium was moderately dilated. - Pulmonary arteries: PA peak pressure: 32 mm Hg (S). - Pericardium, extracardiac:  There was a left pleural effusion    Dg Chest Port 1 View  01/12/2016  CLINICAL DATA:  Shortness of breath EXAM: PORTABLE CHEST 1 VIEW COMPARISON:  01/11/2016 FINDINGS: Patient is status post previous CABG. Cardiac silhouette enlargement is stable. Unchanged position of tracheostomy tube noted. There is mild vascular congestion. Perihilar vessels are indistinct. There are mild interstitial opacities and there is also hazy patchy opacity in the bilateral central lung zones. Small bilateral pleural effusions left greater than right stable. IMPRESSION: No significant change from prior study with findings suggesting pulmonary edema related to congestive Electronically Signed   By: Skipper Cliche M.D.   On: 01/12/2016 07:08   Dg Chest Port 1 View  01/11/2016  CLINICAL DATA:  82 year old female with history of hypoxia. Diabetes and hypertension. EXAM: PORTABLE CHEST 1 VIEW COMPARISON:  Chest x-ray 01/11/2016. FINDINGS: A tracheostomy tube is in place with tip 6.2 cm above the carina. Status post median sternotomy for CABG, including LIMA. Lung volumes are low. There is cephalization of the pulmonary vasculature, indistinctness of the interstitial markings, and patchy airspace disease throughout the lungs bilaterally suggestive of moderate pulmonary edema. Small bilateral pleural effusions (left greater than right). Moderate cardiomegaly. The patient is rotated to the left on today's exam, resulting in distortion of the mediastinal contours and reduced diagnostic sensitivity and specificity for mediastinal pathology. Atherosclerosis in the thoracic aorta. IMPRESSION: 1. Support apparatus and postoperative changes, as above. 2. The appearance the chest remains most compatible with congestive heart failure, as above. There has been little improvement in aeration compared to the prior study. Electronically Signed   By: Vinnie Langton M.D.   On: 01/11/2016 18:16   Dg Chest Port 1 View  01/11/2016  CLINICAL DATA:   Short of breath EXAM: PORTABLE CHEST 1 VIEW COMPARISON:  01/09/2016 FINDINGS: Tracheostomy remains in good position. Postop CABG. Cardiac enlargement. Vascular congestion with mild edema unchanged. Bibasilar atelectasis/ infiltrate unchanged. Small bilateral effusions unchanged. IMPRESSION: Congestive heart failure with pulmonary edema and bilateral effusions unchanged. Bibasilar atelectasis/ infiltrate also unchanged. Electronically Signed   By: Franchot Gallo M.D.   On: 01/11/2016 09:35   Dg Chest Port 1 View  01/09/2016  CLINICAL DATA:  Chronic respiratory failure. Bilateral pleural effusions. Fever. EXAM: PORTABLE CHEST 1 VIEW COMPARISON:  12/19/2015, 12/16/2015, 12/12/2015 and 12/02/2015 FINDINGS: Endotracheal tube remains in good position. Central line has been removed since the prior  study. There is new pulmonary vascular congestion with new bilateral slight perihilar edema as well as edema at the right lung base. Haziness at the right lung base has increased since the prior study. There is chronic cardiomegaly. CABG. Aortic atherosclerosis. Chronic haziness at the left base is stable IMPRESSION: Findings consistent with congestive heart failure with new pulmonary vascular congestion and bilateral pulmonary edema. Electronically Signed   By: Lorriane Shire M.D.   On: 01/09/2016 14:25     Micro Results      Recent Results (from the past 240 hour(s))  Culture, blood (routine x 2)     Status: None (Preliminary result)   Collection Time: 01/09/16  3:30 PM  Result Value Ref Range Status   Specimen Description BLOOD RIGHT FOREARM  Final   Special Requests IN PEDIATRIC BOTTLE 2CC  Final   Culture NO GROWTH 3 DAYS  Final   Report Status PENDING  Incomplete  Culture, blood (routine x 2)     Status: None (Preliminary result)   Collection Time: 01/09/16  3:37 PM  Result Value Ref Range Status   Specimen Description BLOOD RIGHT HAND  Final   Special Requests BOTTLES DRAWN AEROBIC ONLY 5CC  Final    Culture NO GROWTH 3 DAYS  Final   Report Status PENDING  Incomplete  Respiratory virus panel     Status: None   Collection Time: 01/10/16  3:45 AM  Result Value Ref Range Status   Source - RVPAN NASAL SWAB  Corrected   Respiratory Syncytial Virus A Negative Negative Final   Respiratory Syncytial Virus B Negative Negative Final   Influenza A Negative Negative Final   Influenza B Negative Negative Final   Parainfluenza 1 Negative Negative Final   Parainfluenza 2 Negative Negative Final   Parainfluenza 3 Negative Negative Final   Metapneumovirus Negative Negative Final   Rhinovirus Negative Negative Final   Adenovirus Negative Negative Final    Comment: (NOTE) Performed At: [facility] [place], [place] [id number] Lindon Romp MD WL:[id number]   MRSA PCR Screening     Status: Abnormal   Collection Time: 01/10/16  5:13 PM  Result Value Ref Range Status   MRSA by PCR POSITIVE (A) NEGATIVE Final    Comment:        The GeneXpert MRSA Assay (FDA approved for NASAL specimens only), is one component of a comprehensive MRSA colonization surveillance program. It is not intended to diagnose MRSA infection nor to guide or monitor treatment for MRSA infections. RESULT CALLED TO, READ BACK BY AND VERIFIED WITH: [email or web address],RN [id number] 01/10/16 M.CAMPBELL      Today   Subjective    [name] today has no headache,no chest abdominal pain,no new weakness tingling or numbness, feels much better. Answers questions with a head nod with the assistance of her daughter   Objective   Blood pressure 128/85, pulse 84, temperature 98.6 F (37 C), temperature source Oral, resp. rate 18, height '5\' 4"'$  (1.626 m), weight 102.513 kg (226 lb), SpO2 96 %.   Intake/Output Summary (Last 24 hours) at 01/09/2016 0943 Last data filed at 01/21/2016 0600  Gross per 24 hour  Intake 1561.6 ml  Output   1400 ml  Net  161.6 ml    Exam Awake Alert, No new F.N deficits,  Chronic right-sided hemiparesis and right arm tremor from previous stroke,  [place].AT,PERRAL Supple Neck,No JVD, No cervical lymphadenopathy appriciated. Trach site stable with a small old ulcer which is healing, PEG tube site  stable Symmetrical Chest wall movement, Good air movement bilaterally, CTAB RRR,No Gallops,Rubs or new Murmurs, No Parasternal Heave +ve B.Sounds, Abd Soft, Non tender, No organomegaly appriciated, No rebound -guarding or rigidity. No Cyanosis, Clubbing or edema, No new Rash or bruise   Data Review   CBC w Diff:  Lab Results  Component Value Date   WBC 6.7 01/12/2016   WBC 6.1 09/27/2015   HGB 7.9* 01/12/2016   HGB 9.8* 09/27/2015   HCT 26.4* 01/12/2016   HCT 32.5* 09/27/2015   HCT 32.5* 09/02/2015   PLT 169 01/12/2016   PLT 117* 09/27/2015   LYMPHOPCT 14 01/09/2016   LYMPHOPCT 15.4 09/27/2015   MONOPCT 9 01/09/2016   MONOPCT 9.2 09/27/2015   EOSPCT 1 01/09/2016   EOSPCT 5.3 09/27/2015   BASOPCT 0 01/09/2016   BASOPCT 1.2 09/27/2015    CMP:  Lab Results  Component Value Date   NA 139 01/12/2016   NA 136 09/27/2015   NA 138 02/19/2015   K 4.0 01/12/2016   K 4.9 09/27/2015   CL 95* 01/12/2016   CO2 30 01/12/2016   CO2 30* 09/27/2015   BUN 67* 01/12/2016   BUN 26.3* 09/27/2015   BUN 26* 02/19/2015   CREATININE 1.13* 01/12/2016   CREATININE 0.9 09/27/2015   CREATININE 0.8 02/19/2015   CREATININE 0.87 04/12/2014   GLU 98 02/19/2015   PROT 5.3* 01/09/2016   PROT 6.9 09/27/2015   ALBUMIN 1.9* 01/09/2016   ALBUMIN 2.9* 09/27/2015   BILITOT 0.5 01/09/2016   BILITOT 0.53 09/27/2015   ALKPHOS 183* 01/09/2016   ALKPHOS 163* 09/27/2015   AST 104* 01/09/2016   AST 72* 09/27/2015   ALT 103* 01/09/2016   ALT 32 09/27/2015  .   Total Time in preparing paper work, data evaluation and todays exam - 35 minutes  Thurnell Lose M.D on 01/11/2016 at 9:43 AM  [facility]   Office  [phone]

## 2016-01-13 NOTE — Progress Notes (Signed)
Subjective: Resting   Objective: Vital signs in last 24 hours: Temp:  [98.1 F (36.7 C)-98.6 F (37 C)] 98.1 F (36.7 C) (04/21 1226) Pulse Rate:  [45-97] 68 (04/21 1226) Resp:  [17-28] 21 (04/21 1226) BP: (111-151)/(55-127) 119/57 mmHg (04/21 1226) SpO2:  [96 %-100 %] 100 % (04/21 1226) FiO2 (%):  [40 %] 40 % (04/21 1144) Weight:  [226 lb (102.513 kg)] 226 lb (102.513 kg) (04/21 0410) Weight change: 6 lb (2.722 kg) Last BM Date: 01/12/16 Intake/Output from previous day: +161 04/20 0701 - 04/21 0700 In: 1561.6 [I.V.:111.6; NG/GT:1300; IV Piggyback:150] Out: 1400 [Urine:1400] Intake/Output this shift:    PE: per Dr. Royann Shivers  General:Pleasant affect, NAD Skin:Warm and dry, brisk capillary refill HEENT:normocephalic, sclera clear, mucus membranes moist Neck:supple, no JVD, no bruits  Heart:S1S2 RRR without murmur, gallup, rub or click Lungs:clear without rales, rhonchi, or wheezes ZOX:WRUE, non tender, + BS, do not palpate liver spleen or masses Ext:no lower ext edema, 2+ pedal pulses, 2+ radial pulses Neuro:alert and oriented, MAE, follows commands, + facial symmetry Tele:   At times appears to be flutter and then at times SR. + big. PVCs.  Lab Results:  Recent Labs  01/12/16 0337  WBC 6.7  HGB 7.9*  HCT 26.4*  PLT 169   BMET  Recent Labs  01/12/16 0337 12/30/2015 0922  NA 139 134*  K 4.0 3.4*  CL 95* 93*  CO2 30 28  GLUCOSE 139* 190*  BUN 67* 76*  CREATININE 1.13* 1.21*  CALCIUM 7.6* 7.8*   No results for input(s): TROPONINI in the last 72 hours.  Invalid input(s): CK, MB  Lab Results  Component Value Date   CHOL 213* 01/10/2016   HDL 51 01/10/2016   LDLCALC 129* 01/10/2016   TRIG 166* 01/10/2016   CHOLHDL 4.2 01/10/2016   Lab Results  Component Value Date   HGBA1C 5.7* 01/10/2016     Lab Results  Component Value Date   TSH 1.678 11/26/2015    Hepatic Function Panel No results for input(s): PROT, ALBUMIN, AST, ALT,  ALKPHOS, BILITOT, BILIDIR, IBILI in the last 72 hours. No results for input(s): CHOL in the last 72 hours. No results for input(s): PROTIME in the last 72 hours.     Studies/Results: Dg Chest Port 1 View  01/12/2016  CLINICAL DATA:  Shortness of breath EXAM: PORTABLE CHEST 1 VIEW COMPARISON:  01/11/2016 FINDINGS: Patient is status post previous CABG. Cardiac silhouette enlargement is stable. Unchanged position of tracheostomy tube noted. There is mild vascular congestion. Perihilar vessels are indistinct. There are mild interstitial opacities and there is also hazy patchy opacity in the bilateral central lung zones. Small bilateral pleural effusions left greater than right stable. IMPRESSION: No significant change from prior study with findings suggesting pulmonary edema related to congestive Electronically Signed   By: Esperanza Heir M.D.   On: 01/12/2016 07:08   Dg Chest Port 1 View  01/11/2016  CLINICAL DATA:  82 year old female with history of hypoxia. Diabetes and hypertension. EXAM: PORTABLE CHEST 1 VIEW COMPARISON:  Chest x-ray 01/11/2016. FINDINGS: A tracheostomy tube is in place with tip 6.2 cm above the carina. Status post median sternotomy for CABG, including LIMA. Lung volumes are low. There is cephalization of the pulmonary vasculature, indistinctness of the interstitial markings, and patchy airspace disease throughout the lungs bilaterally suggestive of moderate pulmonary edema. Small bilateral pleural effusions (left greater than right). Moderate cardiomegaly. The patient is rotated to the left  on today's exam, resulting in distortion of the mediastinal contours and reduced diagnostic sensitivity and specificity for mediastinal pathology. Atherosclerosis in the thoracic aorta. IMPRESSION: 1. Support apparatus and postoperative changes, as above. 2. The appearance the chest remains most compatible with congestive heart failure, as above. There has been little improvement in aeration  compared to the prior study. Electronically Signed   By: Trudie Reedaniel  Entrikin M.D.   On: 01/11/2016 18:16   ECHO - Left ventricle: The cavity size was normal. There was severe  focal basal and mild concentric hypertrophy. Systolic function  was moderately reduced. The estimated ejection fraction was in  the range of 35% to 40%. There is akinesis of the  mid-apicalinferoseptal myocardium. There is akinesis of the  entireinferior myocardium. Cannot exclude akinesis of the  mid-apicalinferolateral myocardium. Features are consistent with  a pseudonormal left ventricular filling pattern, with concomitant  abnormal relaxation and increased filling pressure (grade 2  diastolic dysfunction). Doppler parameters are consistent with  high ventricular filling pressure. - Ventricular septum: Septal motion showed moderate paradox. These  changes are consistent with intraventricular conduction delay. - Mitral valve: Calcified annulus. There was moderate regurgitation  directed eccentrically and posteriorly. - Left atrium: The atrium was moderately dilated. - Pulmonary arteries: PA peak pressure: 32 mm Hg (S). - Pericardium, extracardiac: There was a left pleural effusion. Medications: I have reviewed the patient's current medications. Scheduled Meds: . amiodarone  200 mg Per Tube BID  . aspirin  162 mg Per Tube Daily  . atorvastatin  40 mg Per Tube q1800  . busPIRone  10 mg Per Tube TID  . carvedilol  6.25 mg Per Tube BID WC  . Chlorhexidine Gluconate Cloth  6 each Topical Q0600  . enoxaparin (LOVENOX) injection  40 mg Subcutaneous Q24H  . ferrous sulfate  300 mg Per Tube Daily  . folic acid  1 mg Per Tube Daily  . furosemide  80 mg Intravenous BID  . haloperidol  0.5 mg Per Tube BID  . hydrALAZINE  50 mg Per Tube 3 times per day  . insulin aspart  0-9 Units Subcutaneous TID WC  . insulin glargine  10 Units Subcutaneous QHS  . isosorbide dinitrate  10 mg Per Tube Q8H  . LORazepam  0.5  mg Oral QHS  . mupirocin ointment  1 application Nasal BID  . neomycin-bacitracin-polymyxin   Topical Daily  . pantoprazole sodium  40 mg Per Tube Daily  . polyethylene glycol  17 g Per Tube Daily  . predniSONE  5 mg Per Tube Q breakfast   Continuous Infusions: . feeding supplement (VITAL HIGH PROTEIN) 1,000 mL (27-Apr-2016 0424)   PRN Meds:.acetaminophen, albuterol, ipratropium-albuterol, ondansetron (ZOFRAN) IV, simethicone  Assessment/Plan: Principal Problem:   Acute on chronic systolic (congestive) heart failure (HCC) Active Problems:   Pulmonary edema   GERD (gastroesophageal reflux disease)   Iron deficiency anemia   Hyperlipidemia   Essential hypertension   HCAP (healthcare-associated pneumonia)   Obesity (BMI 30-39.9)   Hx of CABG-'00   Acute encephalopathy   Type 2 diabetes mellitus with vascular disease (HCC)   PEG (percutaneous endoscopic gastrostomy) adjustment/replacement/removal (HCC)   Respiratory failure (HCC)   CHF exacerbation (HCC)   Elevated troponin   Mitral insufficiency   Acute hypoxemic respiratory failure (HCC)   Acute on chronic systolic congestive heart failure (HCC)   Paroxysmal atrial fibrillation (HCC)  1. Acute on chronic combined systolic and diastolic heart failure Mediocre diuresis so far, increase diuretics as tolerated by renal  function/electrolytes -841 with + 161 for last 24 hours.   2. Atrial fibrillation, paroxysmal - new onset, not a big surprise considering LV dysfunction, MR, LA enlargement, age and comorbid conditions. Switch to PO amiodarone, probably a long term treatment. Not a good anticoagulation or cardioversion candidate. HR 80-105   3. Mitral insufficiency appears moderate to severe per Dr. Royann Shivers  and has eccentric posterior jet typical of ischemic mechanism, rather than central jet of decompensated cardiomyopathy. She is a very poor candidate for surgical repair.  4. Recent pneumonia with positive Klebsiella pneumonia on  sputum culture 4/6; per IM  5. Acute on chronic respiratory failure, s/p Trach 3/2 and PEG 3/14 on Vent. PCCM notes suggest that she is very unlikely to wean off vent. I don't think the patient and her family have truly understood this. They seem to have unrealistic expectations about her chances of recovery ("maybe she can eat real food?", "maybe can stay off vent during the day, 8-10 hours every day?"  6. Elevated troponin, unable to assess if this is demand ischemia versus NTSEMI as patient is nonverbal and confused. Suspect it is related to CHF rather than a new atherothrombotic event. It does signal poor prognosis. - Note recent decline in EF on echocardiogram from 50% down to 35%. Normally will require further workup, however in this case I would not recommend any further ischemic workup, as she is clearly not cath candidate. - She is currently full code as at the wish of the family, however this will need to be readdressed further with the family as she likely will have poor long-term prognosis - Continue aspirin, will hold off on adding Plavix as this time given persistent anemia and a history of multiple transfusion  7. iron deficiency anemia requiring multiple transfusion yesterday h/h 7.9/26.4   8. stage III CKD Cr 1.21 today   9. CAD s/p CABG 02/1999 with LIMA to LAD, left radial to OM, SVG to PDA, and SVG to diagonal  [facility].    LOS: 4 days   Time spent with pt. : 15 minutes. [facility] R  Nurse Practitioner Certified Pager [phone] or after 5pm and on weekends call [phone] Feb 11, 2016, 1:52 PM   I have seen and examined the patient along with [facility] R , NP.  I have reviewed the chart, notes and new data.  I agree with NP's note.  Long discussion with her daughter Angelyn Punt (may have wrong spelling of name). Just like her sister  [place], she plans to eventually take her mother home and I believe she has unrealistic expectations for her recovery,  ability to wean of the ventilator and underestimates the difficulty in caring for her.  PLAN: Continue attempts at diuresis as allowed by renal function, replace K, hydralazine/nitrates and carvedilol, amiodarone. Not an anticoagulation candidate. Both sisters need to be educated re: chances of vent weaning and future challenges.  Thurmon Fair, MD, [facility] [phone] Feb 11, 2016, 2:42 PM

## 2016-01-13 NOTE — Care Management Note (Signed)
Case Management Note  Patient Details  Name: [name] MRN: [id number] Date of Birth: July 24, 1931  Subjective/Objective:  [facility] and dtr specified she would like for pt to return [facility] after admission to hospital.  Discussed two LTAC options in [place] and dtr's choice is [facility].  Per [facility] liaison, pt qualifies and bed is available today.                 Expected Discharge Date:  12/30/2015               Expected Discharge Plan:  Long Term Acute Care (LTAC)  Discharge planning Services  CM Consult  Post Acute Care Choice:  Long Term Acute Care (LTAC)  Status of Service:  Completed, signed off  Medicare Important Message Given:  Yes  Magdalene RiverMayo, Darcel Frane T, RN 01/04/2016, 10:40 AM

## 2016-01-13 NOTE — Progress Notes (Signed)
[facility] all questions answered

## 2016-01-13 NOTE — Discharge Instructions (Signed)
Follow with Primary MD Neena RhymesKatherine Tabori, MD after discharge from the LTAC   Activity: As tolerated with Full fall precautions use walker/cane & assistance as needed   Disposition LTAC   Diet: All of her medications and diet through the PEG tube.  For Heart failure patients - Check your Weight same time everyday, if you gain over 2 pounds, or you develop in leg swelling, experience more shortness of breath or chest pain, call your Primary MD immediately. Follow Cardiac Low Salt Diet and 1.5 lit/day fluid restriction.  On your next visit with your primary care physician please Get Medicines reviewed and adjusted.  Please request your Prim.MD to go over all Hospital Tests and Procedure/Radiological results at the follow up, please get all Hospital records sent to your Prim MD by signing hospital release before you go home.   If you experience worsening of your admission symptoms, develop shortness of breath, life threatening emergency, suicidal or homicidal thoughts you must seek medical attention immediately by calling 911 or calling your MD immediately  if symptoms less severe.  You Must read complete instructions/literature along with all the possible adverse reactions/side effects for all the Medicines you take and that have been prescribed to you. Take any new Medicines after you have completely understood and accpet all the possible adverse reactions/side effects.

## 2016-01-14 ENCOUNTER — Other Ambulatory Visit (HOSPITAL_COMMUNITY): Payer: Self-pay

## 2016-01-14 LAB — CULTURE, BLOOD (ROUTINE X 2)
Culture: NO GROWTH
Culture: NO GROWTH

## 2016-01-14 LAB — BASIC METABOLIC PANEL
Anion gap: 14 (ref 5–15)
BUN: 81 mg/dL — ABNORMAL HIGH (ref 6–20)
CHLORIDE: 94 mmol/L — AB (ref 101–111)
CO2: 28 mmol/L (ref 22–32)
CREATININE: 1.22 mg/dL — AB (ref 0.44–1.00)
Calcium: 8 mg/dL — ABNORMAL LOW (ref 8.9–10.3)
GFR, EST AFRICAN AMERICAN: 46 mL/min — AB (ref >60)
GFR, EST NON AFRICAN AMERICAN: 40 mL/min — AB (ref >60)
Glucose, Bld: 163 mg/dL — ABNORMAL HIGH (ref 65–99)
POTASSIUM: 3.5 mmol/L (ref 3.5–5.1)
SODIUM: 136 mmol/L (ref 135–145)

## 2016-01-16 DIAGNOSIS — J9611 Chronic respiratory failure with hypoxia: Secondary | ICD-10-CM

## 2016-01-16 DIAGNOSIS — I255 Ischemic cardiomyopathy: Secondary | ICD-10-CM | POA: Insufficient documentation

## 2016-01-16 DIAGNOSIS — Z951 Presence of aortocoronary bypass graft: Secondary | ICD-10-CM

## 2016-01-16 DIAGNOSIS — J9612 Chronic respiratory failure with hypercapnia: Secondary | ICD-10-CM

## 2016-01-16 NOTE — Progress Notes (Signed)
Patient Name: [name] Date of Encounter: 01/16/2016    Primary Cardiologist: Dr. Tresa EndoKelly  SUBJECTIVE  Patient nonverbal  CURRENT MEDS Amiodarone 200mg  daily ASA 81mg  daily Lipitor 400mg  qHS Buspirone 10mg  TID Coreg 6.25mg  BID Ferrous sulfate 300mg  daily Folic acid Fish Oil Lasix 40mg  BID Haldol 0.5mg  BID Hydralazine 50mg  TID Isosorbide dinitrate 15mg  BID Lantus 10 units qhS Lovenox 40mg  daily Miralax 17GM daily Prednisone 5mg  qAM Zolpidem tartrate 5mg  qHS Simethicone 60mg  BID  OBJECTIVE  Patient's family at bedside, says she has been agitated recently, pulling on all thing.   PHYSICAL EXAM  General: on trach and vent. TPN at [facility]: Alert. Agitated. Moving upper extremities, does not move any lower extremity HEENT:  Normal  Neck: Supple without bruits. Unable to assess JVD Lungs:  On vent via trac, anterior exam diffuse rhonchi bilaterally.  Heart: Irregular. no s3, s4, or murmurs.  Abdomen: Soft, non-distended, BS + x 4.  Extremities: No clubbing, cyanosis. 3+ pitting edema bilateral LE  Accessory Clinical Findings  Basic Metabolic Panel  Recent Labs  01/14/16 0927  NA 136  K 3.5  CL 94*  CO2 28  GLUCOSE 163*  BUN 81*  CREATININE 1.22*  CALCIUM 8.0*    TELE afib with HR 80-100    ECG  No new EKG  Echocardiogram 01/11/2016  LV EF: 35% - 40%  ------------------------------------------------------------------- Indications: CHF - 428.0.  ------------------------------------------------------------------- History: PMH: Chronic Kidney Disease. GERD. Bilateral Lower Extremity Edema. Altered mental status. Risk factors: Hypertension. Diabetes mellitus.  ------------------------------------------------------------------- Study Conclusions  - Left ventricle: The cavity size was normal. There was severe  focal basal and mild concentric hypertrophy. Systolic function  was moderately reduced. The estimated  ejection fraction was in  the range of 35% to 40%. There is akinesis of the  mid-apicalinferoseptal myocardium. There is akinesis of the  entireinferior myocardium. Cannot exclude akinesis of the  mid-apicalinferolateral myocardium. Features are consistent with  a pseudonormal left ventricular filling pattern, with concomitant  abnormal relaxation and increased filling pressure (grade 2  diastolic dysfunction). Doppler parameters are consistent with  high ventricular filling pressure. - Ventricular septum: Septal motion showed moderate paradox. These  changes are consistent with intraventricular conduction delay. - Mitral valve: Calcified annulus. There was moderate regurgitation  directed eccentrically and posteriorly. - Left atrium: The atrium was moderately dilated. - Pulmonary arteries: PA peak pressure: 32 mm Hg (S). - Pericardium, extracardiac: There was a left pleural effusion.    Radiology/Studies  Dg Chest Port 1 View  01/14/2016  CLINICAL DATA:  Respiratory failure.  Subsequent encounter. EXAM: PORTABLE CHEST 1 VIEW COMPARISON:  01/12/2016 FINDINGS: There is vascular congestion, interstitial thickening and hazy perihilar and more confluent lung base opacity, which appears increased in severity when compared to the most recent prior study. The difference could be due to lower lung volumes and the semi-erect positioning. Suspect at least mild worsening in lung aeration. Cardiac silhouette is mildly enlarged. Changes from CABG surgery are stable. Tracheostomy tube is stable and well positioned. No pneumothorax. IMPRESSION: 1. Worsened lung aeration consistent with increased pulmonary edema. Additional lung base opacity is likely from combination of pleural fluid and atelectasis, which also appears increased from the prior study. Electronically Signed   By: Amie Portlandavid  Ormond M.D.   On: 01/14/2016 08:44   Dg Chest Port 1 View  01/12/2016  CLINICAL DATA:  Shortness of breath EXAM:  PORTABLE CHEST 1 VIEW COMPARISON:  01/11/2016 FINDINGS: Patient is status post previous CABG. Cardiac silhouette enlargement  is stable. Unchanged position of tracheostomy tube noted. There is mild vascular congestion. Perihilar vessels are indistinct. There are mild interstitial opacities and there is also hazy patchy opacity in the bilateral central lung zones. Small bilateral pleural effusions left greater than right stable. IMPRESSION: No significant change from prior study with findings suggesting pulmonary edema related to congestive Electronically Signed   By: Esperanza Heir M.D.   On: 01/12/2016 07:08   Dg Chest Port 1 View  01/11/2016  CLINICAL DATA:  82 year old female with history of hypoxia. Diabetes and hypertension. EXAM: PORTABLE CHEST 1 VIEW COMPARISON:  Chest x-ray 01/11/2016. FINDINGS: A tracheostomy tube is in place with tip 6.2 cm above the carina. Status post median sternotomy for CABG, including LIMA. Lung volumes are low. There is cephalization of the pulmonary vasculature, indistinctness of the interstitial markings, and patchy airspace disease throughout the lungs bilaterally suggestive of moderate pulmonary edema. Small bilateral pleural effusions (left greater than right). Moderate cardiomegaly. The patient is rotated to the left on today's exam, resulting in distortion of the mediastinal contours and reduced diagnostic sensitivity and specificity for mediastinal pathology. Atherosclerosis in the thoracic aorta. IMPRESSION: 1. Support apparatus and postoperative changes, as above. 2. The appearance the chest remains most compatible with congestive heart failure, as above. There has been little improvement in aeration compared to the prior study. Electronically Signed   By: Trudie Reed M.D.   On: 01/11/2016 18:16   Dg Chest Port 1 View  01/11/2016  CLINICAL DATA:  Short of breath EXAM: PORTABLE CHEST 1 VIEW COMPARISON:  01/09/2016 FINDINGS: Tracheostomy remains in good position.  Postop CABG. Cardiac enlargement. Vascular congestion with mild edema unchanged. Bibasilar atelectasis/ infiltrate unchanged. Small bilateral effusions unchanged. IMPRESSION: Congestive heart failure with pulmonary edema and bilateral effusions unchanged. Bibasilar atelectasis/ infiltrate also unchanged. Electronically Signed   By: Marlan Palau M.D.   On: 01/11/2016 09:35   Dg Chest Port 1 View  01/09/2016  CLINICAL DATA:  Chronic respiratory failure. Bilateral pleural effusions. Fever. EXAM: PORTABLE CHEST 1 VIEW COMPARISON:  12/19/2015, 12/16/2015, 12/12/2015 and 12/02/2015 FINDINGS: Endotracheal tube remains in good position. Central line has been removed since the prior study. There is new pulmonary vascular congestion with new bilateral slight perihilar edema as well as edema at the right lung base. Haziness at the right lung base has increased since the prior study. There is chronic cardiomegaly. CABG. Aortic atherosclerosis. Chronic haziness at the left base is stable IMPRESSION: Findings consistent with congestive heart failure with new pulmonary vascular congestion and bilateral pulmonary edema. Electronically Signed   By: Francene Boyers M.D.   On: 01/09/2016 14:25   Dg Chest Port 1 View  12/19/2015  CLINICAL DATA:  Respiratory failure, tracheostomy patient, pulmonary edema; history of CHF, CABG, diabetes, CVA. EXAM: PORTABLE CHEST 1 VIEW COMPARISON:  Portable chest x-ray of December 16, 2015 FINDINGS: The lungs are reasonably well inflated. There is persistent increased density at the lung bases greatest on the left. This is consistent with atelectasis and probable left-sided pleural effusion. The cardiac silhouette remains enlarged. There are chronic post CABG changes. The pulmonary vascularity remains engorged and the pulmonary interstitial markings remain increased. The tracheostomy appliance tip projects at the level of the clavicular heads. The left internal jugular venous catheter tip projects  over the midportion of the SVC. IMPRESSION: CHF with bilateral pulmonary interstitial edema. Bibasilar atelectasis and probable small left pleural effusion. The support tubes are in reasonable position. Electronically Signed   By: David  [place]  M.D.   On: 12/19/2015 07:42   Dg Abd Portable 1v  Jan 18, 2016  CLINICAL DATA:  Check percutaneous gastrostomy tube placement. Patient arrived from outside facility. EXAM: PORTABLE ABDOMEN - 1 VIEW COMPARISON:  12/17/2015 abdominal radiographs. FINDINGS: The tip of percutaneous gastrostomy tube is partially visualized in the left upper quadrant and probably overlies the proximal body of the stomach, although this is not well assessed on this view. Administered Gastrografin pools in the lumen of the stomach, with no evidence of extraluminal contrast, pneumatosis or pneumoperitoneum. No disproportionately dilated small bowel loops. Visualized sternotomy wires appear intact. Clips and coronary stents overlie the heart. Small right pleural effusion. IMPRESSION: Administered Gastrografin pools in the lumen of the stomach, with no evidence of extraluminal contrast leak or pneumoperitoneum. Tip of percutaneous gastrostomy tube is only partially visualized in the left upper quadrant on this view and probably overlies the proximal body of the stomach, although this is not well assessed on this view. These results were called by telephone at the time of interpretation on 01/18/2016 at 9:30 pm to [facility], RN, who verbally acknowledged these results. Electronically Signed   By: Delbert Phenix M.D.   On: 01-18-16 21:36    ASSESSMENT AND PLAN  1. Acute on chronic combined systolic and diastolic heart failure  - her weight is 102.5 kg or 226 lbs today which has not changed from her last weight on 4/21. She still has 3+ pitting edema in her LE, but bear in mind that her albumin was 1.9 on 4/17, some of this 3rd spacing of fluid could be related of severe hypoalbuminemia.   -  it appears her lasix was decreased to 40mg  BID IV lasix from 80mg  BID IV lasix prior to discharge to [facility] on 4/21. Given how difficult it is to tell her overall fluid status, will continue on current dose and monitor weight closely. Recommend daily weight  2. Atrial fibrillation, paroxysmal - new onset, not a big surprise considering LV dysfunction, MR, LA enlargement, age and comorbid conditions. Switched to PO amiodarone, probably a long term treatment. Not a good anticoagulation or cardioversion candidate.  - her amiodarone was weaned down to 200mg  daily at [facility], still in afib, HR 80-100, will continue current dose   3. Mitral insufficiency appears moderate to severe per Dr. Royann Shivers and has eccentric posterior jet typical of ischemic mechanism, rather than central jet of decompensated cardiomyopathy. She is a very poor candidate for surgical repair.  4. Recent pneumonia with positive Klebsiella pneumonia on sputum culture 4/6  5. Acute on chronic respiratory failure, s/p Trach 3/2 and PEG 3/14 on Vent. PCCM notes suggest that she is very unlikely to wean off vent.   - family does not appear to have very realistic goal of care. Despite providers discussing with family, she was still Full Code during last hospitalization. I doubt she will improve to any function level, her clinical condition will likely decline over time.   6. Elevated troponin, unable to assess if this is demand ischemia versus NTSEMI as patient is nonverbal and confused. Suspect it is related to CHF rather than a new atherothrombotic event. It does signal poor prognosis. - Note recent decline in EF on echocardiogram from 50% down to 35%. Normally will require further workup, however in this case I would not recommend any further ischemic workup, as she is clearly not cath candidate. - She is currently full code as at the wish of the family, however this will need to  be readdressed further with the family as  she likely will have poor long-term prognosis - Continue aspirin, will hold off on adding Plavix as this time given persistent anemia and a history of multiple transfusion  7. iron deficiency anemia requiring multiple transfusion yesterday h/h 7.9/26.4   - last Hgb was 7.9 on 4/20. Consider check CBC again  8. stage III CKD Cr 1.22 on 4/22   9. CAD s/p CABG 02/1999 with LIMA to LAD, left radial to OM, SVG to PDA, and SVG to diagonal  - note her recent decline in LV function, again she is not a cath candidate at this time.   Other than noted above, no further recommendation from cardiology  Signed, Azalee Course [facility] Pager: [phone]

## 2016-01-17 ENCOUNTER — Other Ambulatory Visit (HOSPITAL_COMMUNITY): Payer: Self-pay

## 2016-01-17 LAB — CBC
HEMATOCRIT: 17.8 % — AB (ref 36.0–46.0)
Hemoglobin: 5.4 g/dL — CL (ref 12.0–15.0)
MCH: 27 pg (ref 26.0–34.0)
MCHC: 30.3 g/dL (ref 30.0–36.0)
MCV: 89 fL (ref 78.0–100.0)
Platelets: 202 10*3/uL (ref 150–400)
RBC: 2 MIL/uL — ABNORMAL LOW (ref 3.87–5.11)
RDW: 18.6 % — AB (ref 11.5–15.5)
WBC: 12 10*3/uL — ABNORMAL HIGH (ref 4.0–10.5)

## 2016-01-17 LAB — PROTIME-INR
INR: 1.1 (ref 0.00–1.49)
Prothrombin Time: 14.4 seconds (ref 11.6–15.2)

## 2016-01-17 LAB — BASIC METABOLIC PANEL
Anion gap: 12 (ref 5–15)
BUN: 118 mg/dL — ABNORMAL HIGH (ref 6–20)
CALCIUM: 8.3 mg/dL — AB (ref 8.9–10.3)
CO2: 30 mmol/L (ref 22–32)
CREATININE: 1.27 mg/dL — AB (ref 0.44–1.00)
Chloride: 97 mmol/L — ABNORMAL LOW (ref 101–111)
GFR calc Af Amer: 44 mL/min — ABNORMAL LOW (ref >60)
GFR calc non Af Amer: 38 mL/min — ABNORMAL LOW (ref >60)
GLUCOSE: 182 mg/dL — AB (ref 65–99)
Potassium: 3.6 mmol/L (ref 3.5–5.1)
Sodium: 139 mmol/L (ref 135–145)

## 2016-01-18 LAB — BASIC METABOLIC PANEL
ANION GAP: 12 (ref 5–15)
BUN: 124 mg/dL — ABNORMAL HIGH (ref 6–20)
CALCIUM: 8.2 mg/dL — AB (ref 8.9–10.3)
CHLORIDE: 98 mmol/L — AB (ref 101–111)
CO2: 29 mmol/L (ref 22–32)
Creatinine, Ser: 1.33 mg/dL — ABNORMAL HIGH (ref 0.44–1.00)
GFR calc Af Amer: 42 mL/min — ABNORMAL LOW (ref >60)
GFR calc non Af Amer: 36 mL/min — ABNORMAL LOW (ref >60)
GLUCOSE: 197 mg/dL — AB (ref 65–99)
POTASSIUM: 3.8 mmol/L (ref 3.5–5.1)
Sodium: 139 mmol/L (ref 135–145)

## 2016-01-18 LAB — CBC
HEMATOCRIT: 25.5 % — AB (ref 36.0–46.0)
Hemoglobin: 8.1 g/dL — ABNORMAL LOW (ref 12.0–15.0)
MCH: 28.5 pg (ref 26.0–34.0)
MCHC: 31.8 g/dL (ref 30.0–36.0)
MCV: 89.8 fL (ref 78.0–100.0)
Platelets: 160 10*3/uL (ref 150–400)
RBC: 2.84 MIL/uL — ABNORMAL LOW (ref 3.87–5.11)
RDW: 16.8 % — AB (ref 11.5–15.5)
WBC: 10 10*3/uL (ref 4.0–10.5)

## 2016-01-18 LAB — PREPARE RBC (CROSSMATCH)

## 2016-01-19 LAB — TYPE AND SCREEN
ABO/RH(D): O POS
Antibody Screen: NEGATIVE
DAT, IgG: NEGATIVE
DONOR AG TYPE: NEGATIVE
Donor AG Type: NEGATIVE
UNIT DIVISION: 0
UNIT DIVISION: 0

## 2016-01-20 LAB — CBC
HEMATOCRIT: 25.3 % — AB (ref 36.0–46.0)
HEMOGLOBIN: 7.8 g/dL — AB (ref 12.0–15.0)
MCH: 28.1 pg (ref 26.0–34.0)
MCHC: 30.8 g/dL (ref 30.0–36.0)
MCV: 91 fL (ref 78.0–100.0)
Platelets: 187 10*3/uL (ref 150–400)
RBC: 2.78 MIL/uL — ABNORMAL LOW (ref 3.87–5.11)
RDW: 18 % — AB (ref 11.5–15.5)
WBC: 8.6 10*3/uL (ref 4.0–10.5)

## 2016-01-21 LAB — BASIC METABOLIC PANEL
Anion gap: 13 (ref 5–15)
BUN: 129 mg/dL — AB (ref 6–20)
CALCIUM: 8.3 mg/dL — AB (ref 8.9–10.3)
CO2: 30 mmol/L (ref 22–32)
CREATININE: 1.41 mg/dL — AB (ref 0.44–1.00)
Chloride: 95 mmol/L — ABNORMAL LOW (ref 101–111)
GFR calc Af Amer: 39 mL/min — ABNORMAL LOW (ref >60)
GFR calc non Af Amer: 34 mL/min — ABNORMAL LOW (ref >60)
GLUCOSE: 175 mg/dL — AB (ref 65–99)
Potassium: 3.8 mmol/L (ref 3.5–5.1)
Sodium: 138 mmol/L (ref 135–145)

## 2016-01-23 LAB — BASIC METABOLIC PANEL
ANION GAP: 12 (ref 5–15)
BUN: 139 mg/dL — ABNORMAL HIGH (ref 6–20)
CHLORIDE: 94 mmol/L — AB (ref 101–111)
CO2: 30 mmol/L (ref 22–32)
Calcium: 8.1 mg/dL — ABNORMAL LOW (ref 8.9–10.3)
Creatinine, Ser: 1.4 mg/dL — ABNORMAL HIGH (ref 0.44–1.00)
GFR calc Af Amer: 39 mL/min — ABNORMAL LOW (ref >60)
GFR, EST NON AFRICAN AMERICAN: 34 mL/min — AB (ref >60)
GLUCOSE: 172 mg/dL — AB (ref 65–99)
POTASSIUM: 4.5 mmol/L (ref 3.5–5.1)
Sodium: 136 mmol/L (ref 135–145)

## 2016-01-23 DEATH — deceased

## 2016-01-24 ENCOUNTER — Telehealth: Payer: Self-pay | Admitting: Oncology

## 2016-01-24 NOTE — Telephone Encounter (Signed)
pt daughter cld to CX appt-pt in hospital-will call after she gets out to r/s

## 2016-01-25 ENCOUNTER — Other Ambulatory Visit (HOSPITAL_COMMUNITY): Payer: Self-pay

## 2016-01-25 LAB — BASIC METABOLIC PANEL
Anion gap: 13 (ref 5–15)
BUN: 145 mg/dL — AB (ref 6–20)
CHLORIDE: 93 mmol/L — AB (ref 101–111)
CO2: 28 mmol/L (ref 22–32)
CREATININE: 1.53 mg/dL — AB (ref 0.44–1.00)
Calcium: 8.2 mg/dL — ABNORMAL LOW (ref 8.9–10.3)
GFR calc Af Amer: 35 mL/min — ABNORMAL LOW (ref >60)
GFR calc non Af Amer: 31 mL/min — ABNORMAL LOW (ref >60)
GLUCOSE: 136 mg/dL — AB (ref 65–99)
POTASSIUM: 4.2 mmol/L (ref 3.5–5.1)
SODIUM: 134 mmol/L — AB (ref 135–145)

## 2016-01-25 LAB — CBC
HCT: 22.7 % — ABNORMAL LOW (ref 36.0–46.0)
HEMOGLOBIN: 7 g/dL — AB (ref 12.0–15.0)
MCH: 27.1 pg (ref 26.0–34.0)
MCHC: 30.8 g/dL (ref 30.0–36.0)
MCV: 88 fL (ref 78.0–100.0)
Platelets: 191 10*3/uL (ref 150–400)
RBC: 2.58 MIL/uL — AB (ref 3.87–5.11)
RDW: 17.5 % — ABNORMAL HIGH (ref 11.5–15.5)
WBC: 10.2 10*3/uL (ref 4.0–10.5)

## 2016-01-26 ENCOUNTER — Other Ambulatory Visit: Payer: Medicare Other

## 2016-01-26 ENCOUNTER — Ambulatory Visit: Payer: Medicare Other | Admitting: Oncology

## 2016-01-27 LAB — BASIC METABOLIC PANEL
Anion gap: 17 — ABNORMAL HIGH (ref 5–15)
BUN: 154 mg/dL — ABNORMAL HIGH (ref 6–20)
CHLORIDE: 92 mmol/L — AB (ref 101–111)
CO2: 27 mmol/L (ref 22–32)
CREATININE: 1.65 mg/dL — AB (ref 0.44–1.00)
Calcium: 8.4 mg/dL — ABNORMAL LOW (ref 8.9–10.3)
GFR calc non Af Amer: 28 mL/min — ABNORMAL LOW (ref >60)
GFR, EST AFRICAN AMERICAN: 32 mL/min — AB (ref >60)
Glucose, Bld: 153 mg/dL — ABNORMAL HIGH (ref 65–99)
POTASSIUM: 4.2 mmol/L (ref 3.5–5.1)
Sodium: 136 mmol/L (ref 135–145)

## 2016-01-27 LAB — CBC
HEMATOCRIT: 22.9 % — AB (ref 36.0–46.0)
HEMOGLOBIN: 6.9 g/dL — AB (ref 12.0–15.0)
MCH: 27.4 pg (ref 26.0–34.0)
MCHC: 30.1 g/dL (ref 30.0–36.0)
MCV: 90.9 fL (ref 78.0–100.0)
Platelets: 204 10*3/uL (ref 150–400)
RBC: 2.52 MIL/uL — ABNORMAL LOW (ref 3.87–5.11)
RDW: 17.6 % — ABNORMAL HIGH (ref 11.5–15.5)
WBC: 10.8 10*3/uL — ABNORMAL HIGH (ref 4.0–10.5)

## 2016-01-27 LAB — PREPARE RBC (CROSSMATCH)

## 2016-01-29 ENCOUNTER — Other Ambulatory Visit (HOSPITAL_COMMUNITY): Payer: Self-pay

## 2016-01-29 LAB — BASIC METABOLIC PANEL
ANION GAP: 14 (ref 5–15)
BUN: 171 mg/dL — ABNORMAL HIGH (ref 6–20)
CO2: 25 mmol/L (ref 22–32)
Calcium: 8.3 mg/dL — ABNORMAL LOW (ref 8.9–10.3)
Chloride: 93 mmol/L — ABNORMAL LOW (ref 101–111)
Creatinine, Ser: 1.64 mg/dL — ABNORMAL HIGH (ref 0.44–1.00)
GFR calc Af Amer: 33 mL/min — ABNORMAL LOW (ref >60)
GFR calc non Af Amer: 28 mL/min — ABNORMAL LOW (ref >60)
GLUCOSE: 172 mg/dL — AB (ref 65–99)
POTASSIUM: 4.1 mmol/L (ref 3.5–5.1)
Sodium: 132 mmol/L — ABNORMAL LOW (ref 135–145)

## 2016-01-29 LAB — CBC
HEMATOCRIT: 23.5 % — AB (ref 36.0–46.0)
Hemoglobin: 7.3 g/dL — ABNORMAL LOW (ref 12.0–15.0)
MCH: 27.4 pg (ref 26.0–34.0)
MCHC: 31.1 g/dL (ref 30.0–36.0)
MCV: 88.3 fL (ref 78.0–100.0)
Platelets: 207 10*3/uL (ref 150–400)
RBC: 2.66 MIL/uL — AB (ref 3.87–5.11)
RDW: 18.7 % — ABNORMAL HIGH (ref 11.5–15.5)
WBC: 10.7 10*3/uL — AB (ref 4.0–10.5)

## 2016-01-30 NOTE — Progress Notes (Signed)
[facility]  ROUNDING NOTE   Subjective:  Patient will not was from prior admission at [facility]. She remains critically ill in this point in time and remains ventilator dependent and has Significant renal insufficiency with a BUN of 171 and creatinine 1.6. We suspect that her EGFR Is inaccurate given the fact that she is bedridden.   Objective:  Vitals: Temperature 90.5 pulse 72 respirations 16 blood pressure 123/62  Physical Exam: General: Resting in bed, critically ill appearing  Head: Normocephalic, atraumatic. Moist oral mucosal membranes  Eyes: Anicteric  Neck: Tracheostomy in place  Lungs:  Scattered rhonchi, vent assisted, PEG  Heart: S1S2 no rubs  Abdomen:  Soft, nontender, BS present   Extremities: 3+ peripheral edema.  Neurologic: awake, has facial grimace  Skin: No lesions       Basic Metabolic Panel:  Recent Labs Lab 01/25/16 0528 01/27/16 0731 01/29/16 0736  NA 134* 136 132*  K 4.2 4.2 4.1  CL 93* 92* 93*  CO2 _0 GLUCOSE 136* 153* 172*  BUN 145* 154* 171*  CREATININE 1.53* 1.65* 1.64*  CALCIUM 8.2* 8.4* 8.3*    Liver Function Tests: No results for input(s): AST, ALT, ALKPHOS, BILITOT, PROT, ALBUMIN in the last 168 hours. No results for input(s): LIPASE, AMYLASE in the last 168 hours. No results for input(s): AMMONIA in the last 168 hours.  CBC:  Recent Labs Lab 01/25/16 0528 01/27/16 0731 01/29/16 0736  WBC 10.2 10.8* 10.7*  HGB 7.0* 6.9* 7.3*  HCT 22.7* 22.9* 23.5*  MCV 88.0 90.9 88.3  PLT 191 204 207    Cardiac Enzymes: No results for input(s): CKTOTAL, CKMB, CKMBINDEX, TROPONINI in the last 168 hours.  BNP: Invalid input(s): POCBNP  CBG: No results for input(s): GLUCAP in the last 168 hours.  Microbiology: Results for orders placed or performed during the hospital encounter of 01/09/16  Culture, blood (routine x 2)     Status: None   Collection Time: 01/09/16  3:30 PM  Result Value Ref  Range Status   Specimen Description BLOOD RIGHT FOREARM  Final   Special Requests IN PEDIATRIC BOTTLE 2CC  Final   Culture NO GROWTH 5 DAYS  Final   Report Status 01/14/2016 FINAL  Final  Culture, blood (routine x 2)     Status: None   Collection Time: 01/09/16  3:37 PM  Result Value Ref Range Status   Specimen Description BLOOD RIGHT HAND  Final   Special Requests BOTTLES DRAWN AEROBIC ONLY 5CC  Final   Culture NO GROWTH 5 DAYS  Final   Report Status 01/14/2016 FINAL  Final  Respiratory virus panel     Status: None   Collection Time: 01/10/16  3:45 AM  Result Value Ref Range Status   Source - RVPAN NASAL SWAB  Corrected   Respiratory Syncytial Virus A Negative Negative Final   Respiratory Syncytial Virus B Negative Negative Final   Influenza A Negative Negative Final   Influenza B Negative Negative Final   Parainfluenza 1 Negative Negative Final   Parainfluenza 2 Negative Negative Final   Parainfluenza 3 Negative Negative Final   Metapneumovirus Negative Negative Final   Rhinovirus Negative Negative Final   Adenovirus Negative Negative Final    Comment: (NOTE) Performed At: [facility] [place], [place] [id number] Lindon Romp MD [id number]   MRSA PCR Screening     Status: Abnormal   Collection Time: 01/10/16  5:13 PM  Result Value Ref Range Status  MRSA by PCR POSITIVE (A) NEGATIVE Final    Comment:        The GeneXpert MRSA Assay (FDA approved for NASAL specimens only), is one component of a comprehensive MRSA colonization surveillance program. It is not intended to diagnose MRSA infection nor to guide or monitor treatment for MRSA infections. RESULT CALLED TO, READ BACK BY AND VERIFIED WITH: K.ABDUSSALAAM,RN [id number] 01/10/16 M.CAMPBELL     Coagulation Studies: No results for input(s): LABPROT, INR in the last 72 hours.  Urinalysis: No results for input(s): COLORURINE, LABSPEC, PHURINE, GLUCOSEU, HGBUR, BILIRUBINUR, KETONESUR,  PROTEINUR, UROBILINOGEN, NITRITE, LEUKOCYTESUR in the last 72 hours.  Invalid input(s): APPERANCEUR    Imaging: Dg Chest Port 1 View  01/29/2016  CLINICAL DATA:  Pneumonia EXAM: PORTABLE CHEST 1 VIEW COMPARISON:  Jan 25, 2016 FINDINGS: The tracheostomy tube is in stable position. Haziness over the right lung base has increased, likely representing a combination of layering fluid and underlying opacity. Mild edema persists. Opacity persists in left base with obscuration of the left hemidiaphragm. No other changes. IMPRESSION: 1. Increasing pleural fluid and underlying opacity in the right lung base. The underlying opacity may simply represent atelectasis. 2. Stable opacity in the left base, possibly atelectasis. 3. Pulmonary edema. Electronically Signed   By: Dorise Bullion III M.D   On: 01/29/2016 08:46     Medications:       Assessment/ Plan:  82 y.o. female with a PMHx of diabetes mellitus type 2, hypertension, history of CVA, coronary artery disease, chronic kidney disease stage III who was admitted to [place] on 11/25/2015 for on going treatment of respiratory failure. She was recently admitted to [facility] from 11/07/2015 to 11/25/2015, and then again from 01/09/16 to 01/05/2016. Her primary issue was respiratory failure secondary to CHF, pleural effusions, and pneumonia.  She is status post tracheostomy placement.  1. Acute renal failure with profound azotemia due to diuresis, tube feeds, digested blood products, catabolic state.   2. CKD stage III 3. Anemia of CKD.  4. Acute respiratory failure. 5. Protein calorie malnutrition.  6.  Hypertension.  Plan:  Overall the patient continues to be quite critically ill. She has underlying respiratory failure, congestive heart failure, severe renal insufficiency, bedbound status, severe protein calorie malnutrition. She has remained ventilator dependent for a very significant period of time. I had a long discussion with  the patient's health care power of attorney. Options appear to be quite limited at this point in time. I suspect that her creatinine does not accurately reflect her to renal function and she is bedbound and likely has very little muscle mass. The patient would make her very poor outpatient dialysis candidate. I don't think that she would be likely able to leave an institution therefore we will have to consider disposition such as a chronic unit in [place] that takes care of patients with tracheostomies as well as ventilator management along with dialysis support. The family does not appear to be interested in this. The patient's family needs more time to consider her overall status and Dr. Laren Everts will continue to work with him regarding this.   LOS:  Ugo Thoma 5/8/20174:12 PM

## 2016-02-01 ENCOUNTER — Other Ambulatory Visit (HOSPITAL_COMMUNITY): Payer: Self-pay

## 2016-02-01 LAB — BASIC METABOLIC PANEL
Anion gap: 16 — ABNORMAL HIGH (ref 5–15)
BUN: 192 mg/dL — AB (ref 6–20)
CHLORIDE: 91 mmol/L — AB (ref 101–111)
CO2: 25 mmol/L (ref 22–32)
CREATININE: 1.74 mg/dL — AB (ref 0.44–1.00)
Calcium: 8.6 mg/dL — ABNORMAL LOW (ref 8.9–10.3)
GFR calc non Af Amer: 26 mL/min — ABNORMAL LOW (ref >60)
GFR, EST AFRICAN AMERICAN: 30 mL/min — AB (ref >60)
Glucose, Bld: 145 mg/dL — ABNORMAL HIGH (ref 65–99)
POTASSIUM: 3.7 mmol/L (ref 3.5–5.1)
SODIUM: 132 mmol/L — AB (ref 135–145)

## 2016-02-01 LAB — TYPE AND SCREEN
ABO/RH(D): O POS
Antibody Screen: NEGATIVE
Donor AG Type: NEGATIVE
Unit division: 0

## 2016-02-01 LAB — CBC
HCT: 21.9 % — ABNORMAL LOW (ref 36.0–46.0)
Hemoglobin: 6.7 g/dL — CL (ref 12.0–15.0)
MCH: 26.2 pg (ref 26.0–34.0)
MCHC: 30.6 g/dL (ref 30.0–36.0)
MCV: 85.5 fL (ref 78.0–100.0)
Platelets: 172 10*3/uL (ref 150–400)
RBC: 2.56 MIL/uL — ABNORMAL LOW (ref 3.87–5.11)
RDW: 18.3 % — ABNORMAL HIGH (ref 11.5–15.5)
WBC: 8.9 10*3/uL (ref 4.0–10.5)

## 2016-02-01 LAB — PREPARE RBC (CROSSMATCH)

## 2016-02-02 LAB — TYPE AND SCREEN
ABO/RH(D): O POS
Antibody Screen: NEGATIVE
DONOR AG TYPE: NEGATIVE
UNIT DIVISION: 0

## 2016-02-04 ENCOUNTER — Other Ambulatory Visit (HOSPITAL_COMMUNITY): Payer: Self-pay

## 2016-02-04 LAB — COMPREHENSIVE METABOLIC PANEL
ALBUMIN: 1.8 g/dL — AB (ref 3.5–5.0)
ALK PHOS: 161 U/L — AB (ref 38–126)
ALT: 45 U/L (ref 14–54)
ALT: 47 U/L (ref 14–54)
ANION GAP: 16 — AB (ref 5–15)
AST: 77 U/L — AB (ref 15–41)
AST: 74 U/L — AB (ref 15–41)
Albumin: 1.6 g/dL — ABNORMAL LOW (ref 3.5–5.0)
Alkaline Phosphatase: 157 U/L — ABNORMAL HIGH (ref 38–126)
Anion gap: 13 (ref 5–15)
BILIRUBIN TOTAL: 0.6 mg/dL (ref 0.3–1.2)
BUN: 212 mg/dL — AB (ref 6–20)
BUN: 208 mg/dL — AB (ref 6–20)
CALCIUM: 8.6 mg/dL — AB (ref 8.9–10.3)
CO2: 24 mmol/L (ref 22–32)
CO2: 27 mmol/L (ref 22–32)
CREATININE: 2.06 mg/dL — AB (ref 0.44–1.00)
Calcium: 8.3 mg/dL — ABNORMAL LOW (ref 8.9–10.3)
Chloride: 90 mmol/L — ABNORMAL LOW (ref 101–111)
Chloride: 87 mmol/L — ABNORMAL LOW (ref 101–111)
Creatinine, Ser: 2.12 mg/dL — ABNORMAL HIGH (ref 0.44–1.00)
GFR calc Af Amer: 24 mL/min — ABNORMAL LOW (ref >60)
GFR calc non Af Amer: 21 mL/min — ABNORMAL LOW (ref >60)
GFR, EST AFRICAN AMERICAN: 25 mL/min — AB (ref >60)
GFR, EST NON AFRICAN AMERICAN: 21 mL/min — AB (ref >60)
GLUCOSE: 156 mg/dL — AB (ref 65–99)
Glucose, Bld: 171 mg/dL — ABNORMAL HIGH (ref 65–99)
POTASSIUM: 3.9 mmol/L (ref 3.5–5.1)
Potassium: 4.1 mmol/L (ref 3.5–5.1)
SODIUM: 130 mmol/L — AB (ref 135–145)
Sodium: 127 mmol/L — ABNORMAL LOW (ref 135–145)
TOTAL PROTEIN: 5.1 g/dL — AB (ref 6.5–8.1)
Total Bilirubin: 0.7 mg/dL (ref 0.3–1.2)
Total Protein: 5.3 g/dL — ABNORMAL LOW (ref 6.5–8.1)

## 2016-02-04 LAB — CBC
HEMATOCRIT: 23.3 % — AB (ref 36.0–46.0)
HEMOGLOBIN: 7.1 g/dL — AB (ref 12.0–15.0)
MCH: 27 pg (ref 26.0–34.0)
MCHC: 30.5 g/dL (ref 30.0–36.0)
MCV: 88.6 fL (ref 78.0–100.0)
Platelets: 163 10*3/uL (ref 150–400)
RBC: 2.63 MIL/uL — ABNORMAL LOW (ref 3.87–5.11)
RDW: 18.7 % — ABNORMAL HIGH (ref 11.5–15.5)
WBC: 7.6 10*3/uL (ref 4.0–10.5)

## 2016-02-04 LAB — LACTIC ACID, PLASMA: Lactic Acid, Venous: 1.1 mmol/L (ref 0.5–2.0)

## 2016-02-10 ENCOUNTER — Ambulatory Visit: Payer: Medicare Other | Admitting: Cardiovascular Disease

## 2016-02-23 DEATH — deceased

## 2016-03-12 ENCOUNTER — Ambulatory Visit: Payer: Self-pay | Admitting: Family Medicine

## 2016-04-27 ENCOUNTER — Ambulatory Visit: Payer: Medicare Other | Admitting: Family Medicine

## 2016-06-08 IMAGING — CR DG CHEST 1V PORT
1 series · 1 of 1 positions shown · non-contrast
Comparison: Portable chest x-ray December 16, 2015

CLINICAL DATA: Respiratory failure, tracheostomy patient, pulmonary
edema; history of CHF, CABG, diabetes, CVA.

EXAM:
PORTABLE CHEST 1 VIEW

[AP]
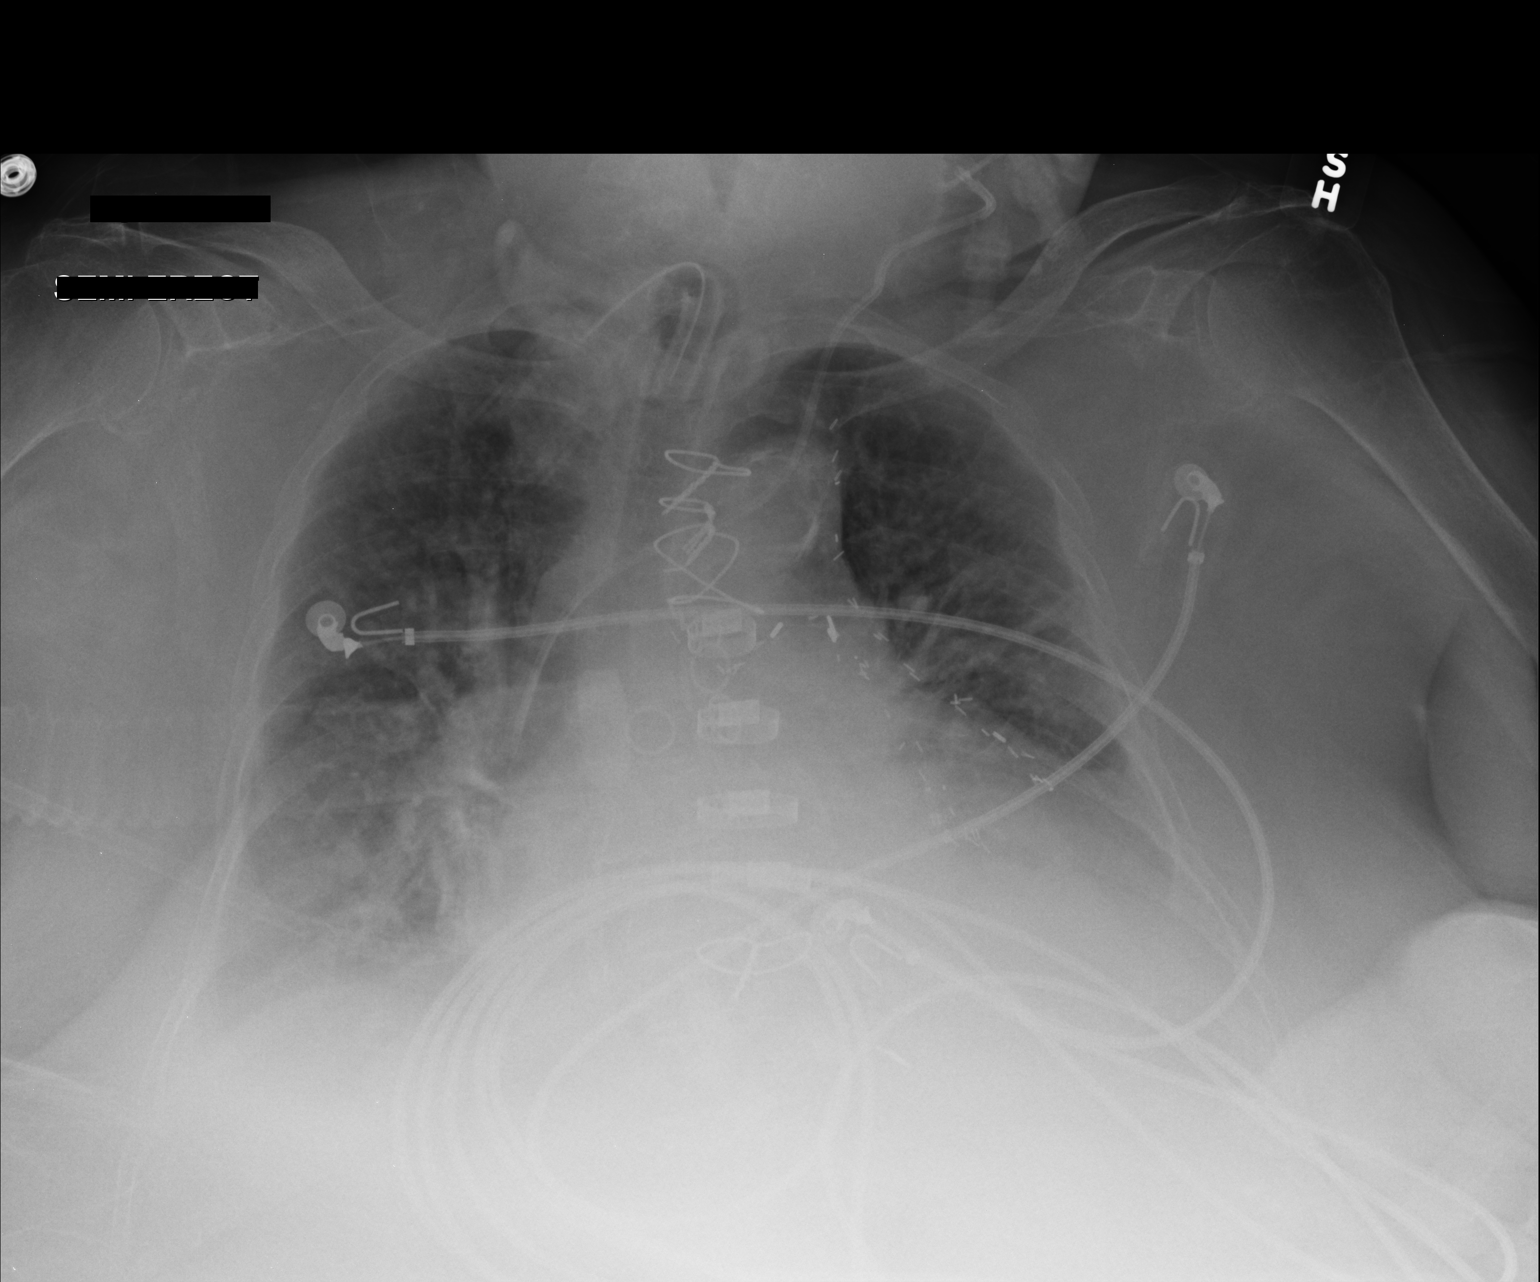

[1 of 1 positions shown; findings below may reference images not displayed]

FINDINGS: The lungs are reasonably well inflated. There is persistent
increased density at the lung bases greatest on the left. This is
consistent with atelectasis and probable left-sided pleural
effusion. The cardiac silhouette remains enlarged. There are chronic
post CABG changes. The pulmonary vascularity remains engorged and
the pulmonary interstitial markings remain increased.

The tracheostomy appliance tip projects at the level of the
clavicular heads. The left internal jugular venous catheter tip
projects over the midportion of the SVC.
IMPRESSION: CHF with bilateral pulmonary interstitial edema. Bibasilar
atelectasis and probable small left pleural effusion. The support
tubes are in reasonable position.

## 2016-07-01 IMAGING — DX DG CHEST 1V PORT
1 series · 1 of 1 positions shown · non-contrast
Comparison: Chest x-ray 01/11/2016.

CLINICAL DATA: 84-year-old female with history of hypoxia. Diabetes
and hypertension.

EXAM:
PORTABLE CHEST 1 VIEW

[chest ap]
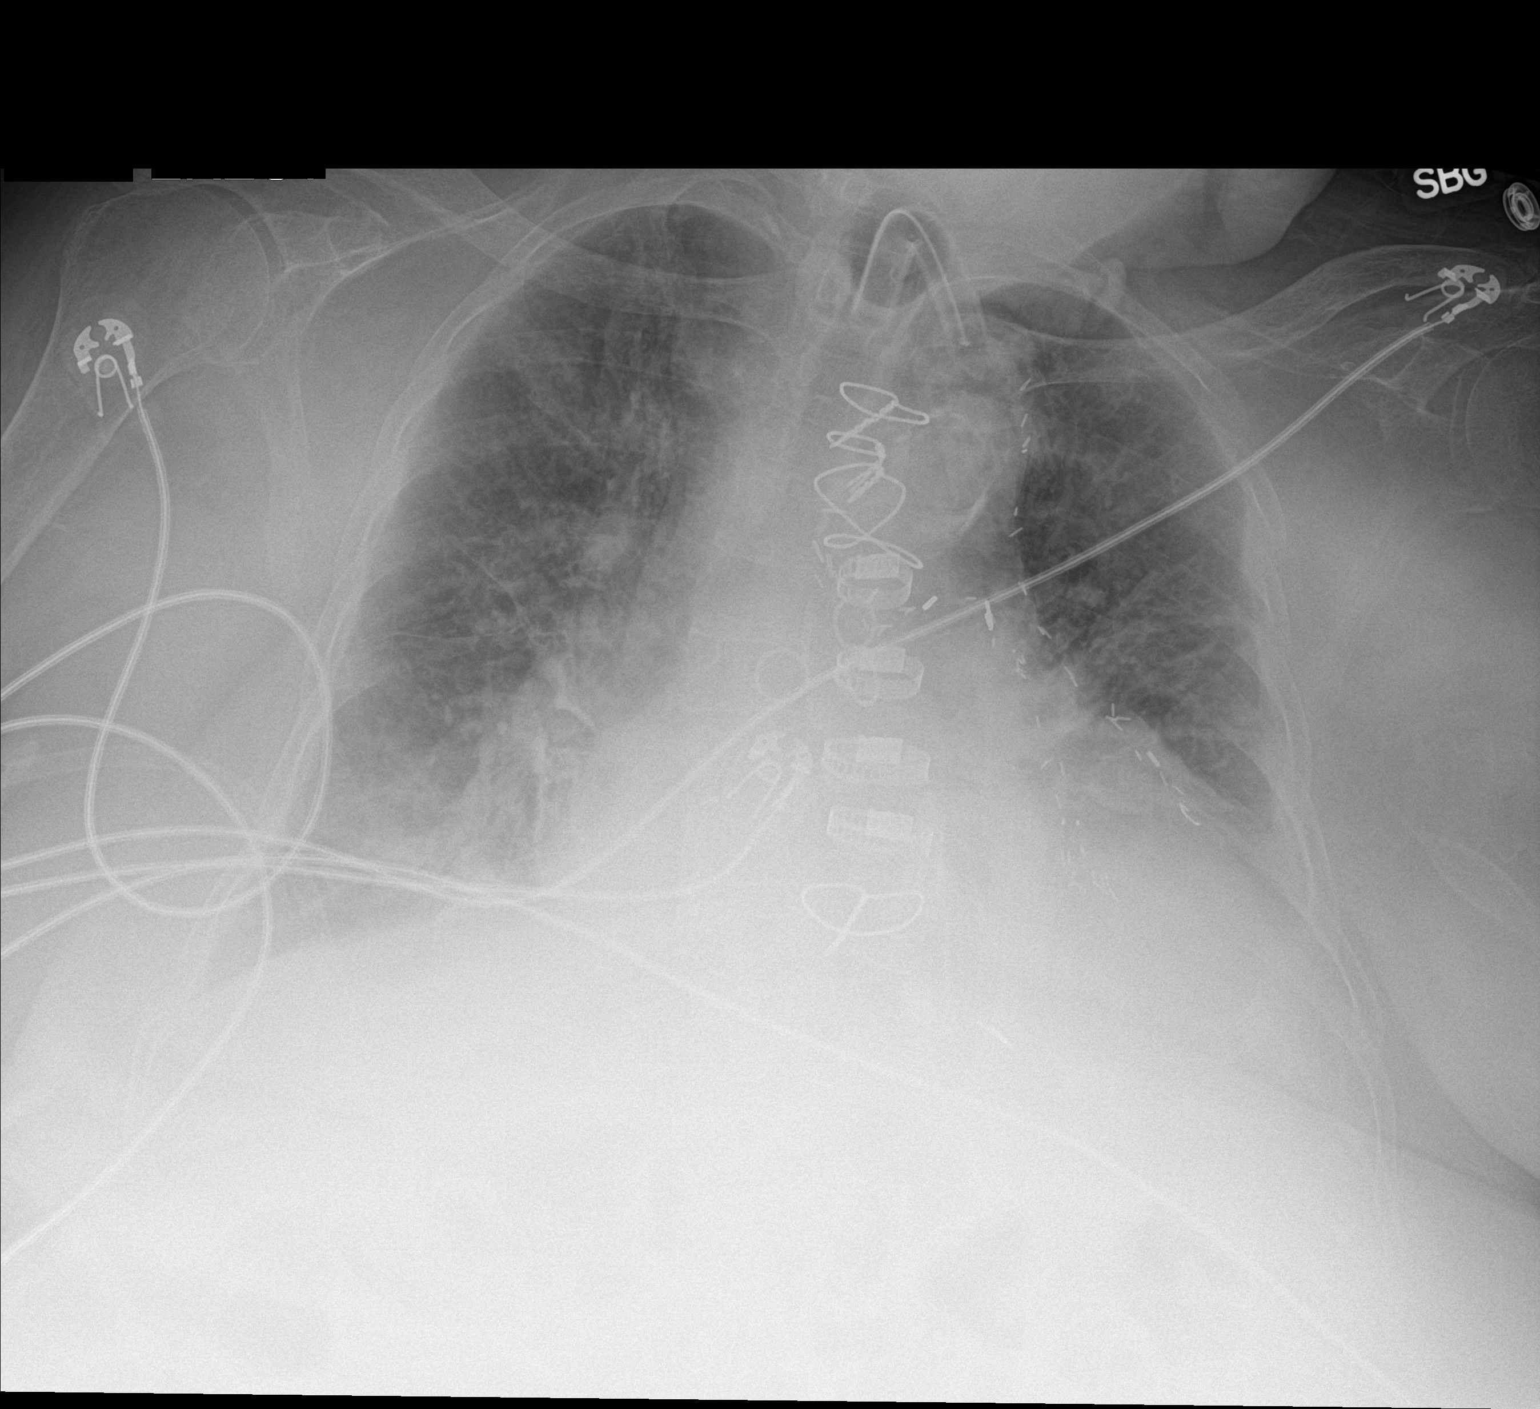

[1 of 1 positions shown; findings below may reference images not displayed]

FINDINGS: A tracheostomy tube is in place with tip 6.2 cm above the carina.
Status post median sternotomy for CABG, including [REDACTED]. Lung volumes
are low. There is cephalization of the pulmonary vasculature,
indistinctness of the interstitial markings, and patchy airspace
disease throughout the lungs bilaterally suggestive of moderate
pulmonary edema. Small bilateral pleural effusions (left greater
than right). Moderate cardiomegaly. The patient is rotated to the
left on today's exam, resulting in distortion of the mediastinal
contours and reduced diagnostic sensitivity and specificity for
mediastinal pathology. Atherosclerosis in the thoracic aorta.
IMPRESSION: 1. Support apparatus and postoperative changes, as above.
2. The appearance the chest remains most compatible with congestive
heart failure, as above. There has been little improvement in
aeration compared to the prior study.

## 2016-07-01 IMAGING — CR DG CHEST 1V PORT
1 series · 1 of 1 positions shown · non-contrast
Comparison: 01/09/2016

CLINICAL DATA: Short of breath

EXAM:
PORTABLE CHEST 1 VIEW

[AP]
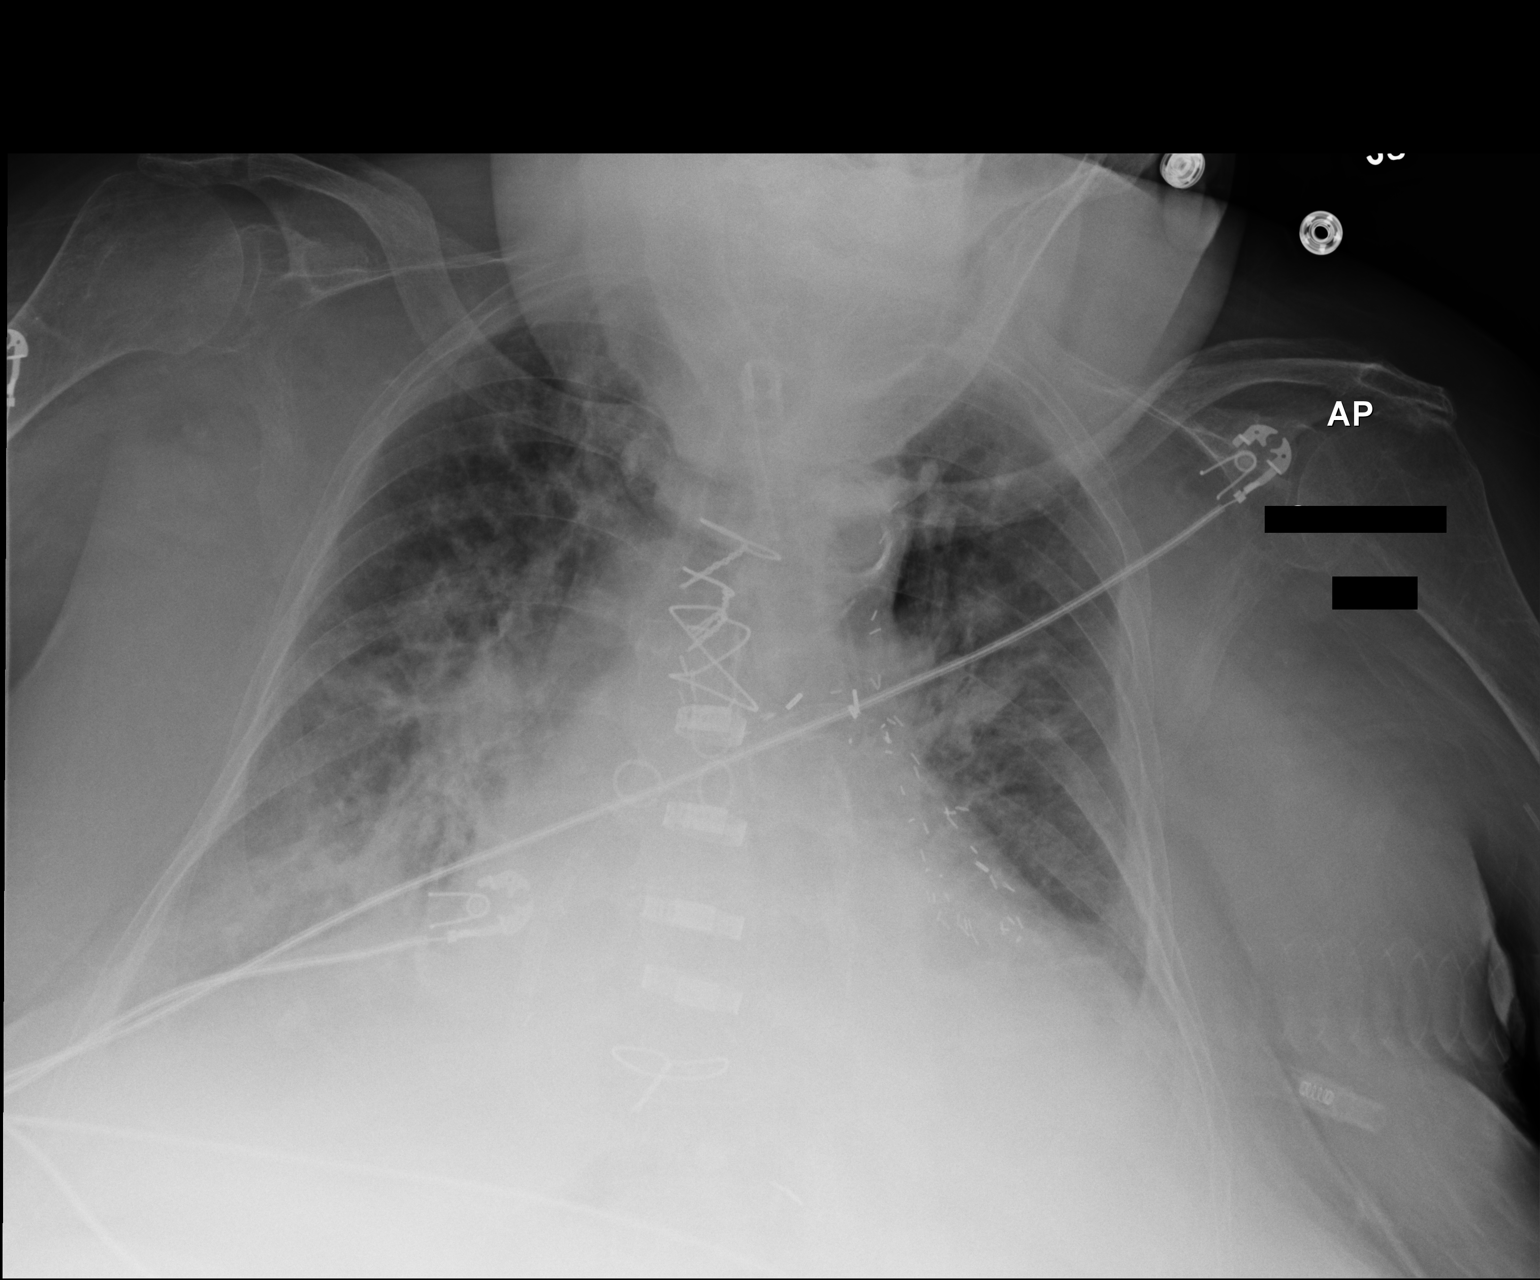

[1 of 1 positions shown; findings below may reference images not displayed]

FINDINGS: Tracheostomy remains in good position. Postop CABG. Cardiac
enlargement. Vascular congestion with mild edema unchanged.

Bibasilar atelectasis/ infiltrate unchanged. Small bilateral
effusions unchanged.
IMPRESSION: Congestive heart failure with pulmonary edema and bilateral
effusions unchanged. Bibasilar atelectasis/ infiltrate also
unchanged.

## 2016-07-02 IMAGING — DX DG CHEST 1V PORT
1 series · 1 of 1 positions shown · non-contrast
Comparison: 01/11/2016

CLINICAL DATA: Shortness of breath

EXAM:
PORTABLE CHEST 1 VIEW

[chest ap]
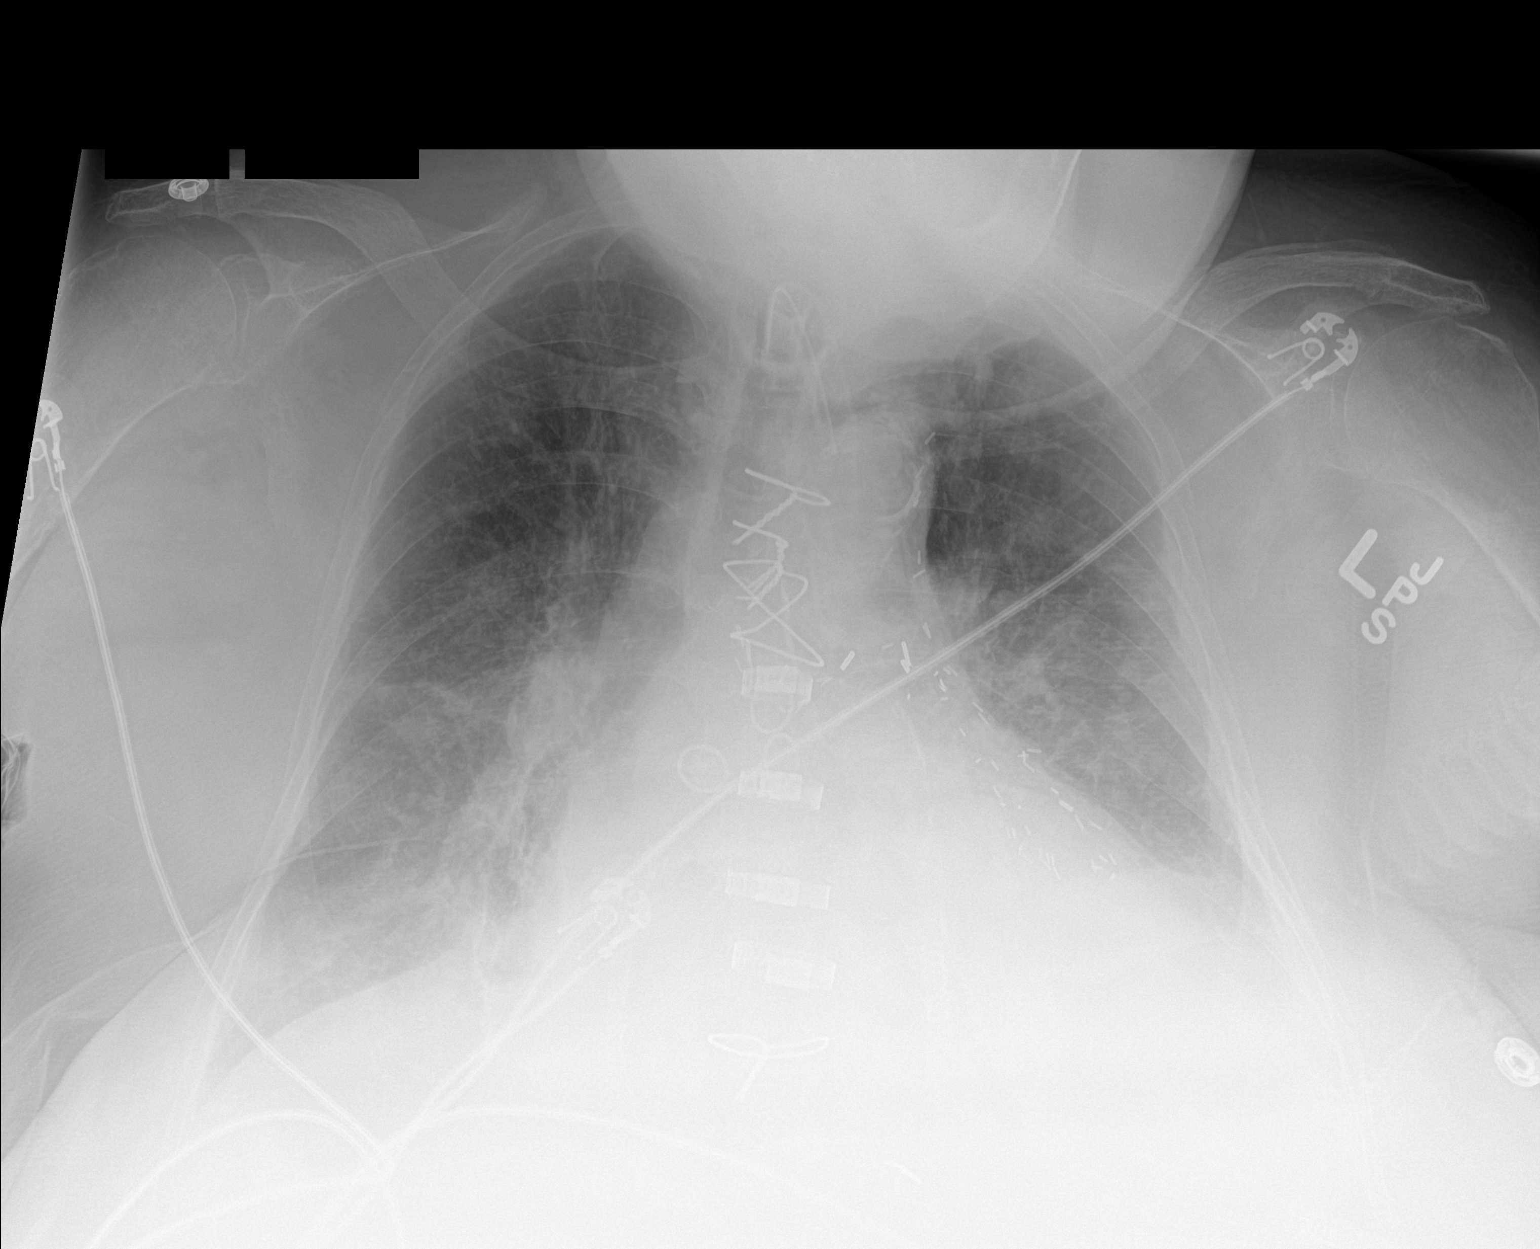

[1 of 1 positions shown; findings below may reference images not displayed]

FINDINGS: Patient is status post previous CABG. Cardiac silhouette enlargement
is stable. Unchanged position of tracheostomy tube noted.

There is mild vascular congestion. Perihilar vessels are indistinct.
There are mild interstitial opacities and there is also hazy patchy
opacity in the bilateral central lung zones. Small bilateral pleural
effusions left greater than right stable.
IMPRESSION: No significant change from prior study with findings suggesting
pulmonary edema related to congestive

## 2016-07-04 IMAGING — CR DG CHEST 1V PORT
1 series · 1 of 1 positions shown · non-contrast
Comparison: 01/12/2016

CLINICAL DATA: Respiratory failure.  Subsequent encounter.

EXAM:
PORTABLE CHEST 1 VIEW

[AP]
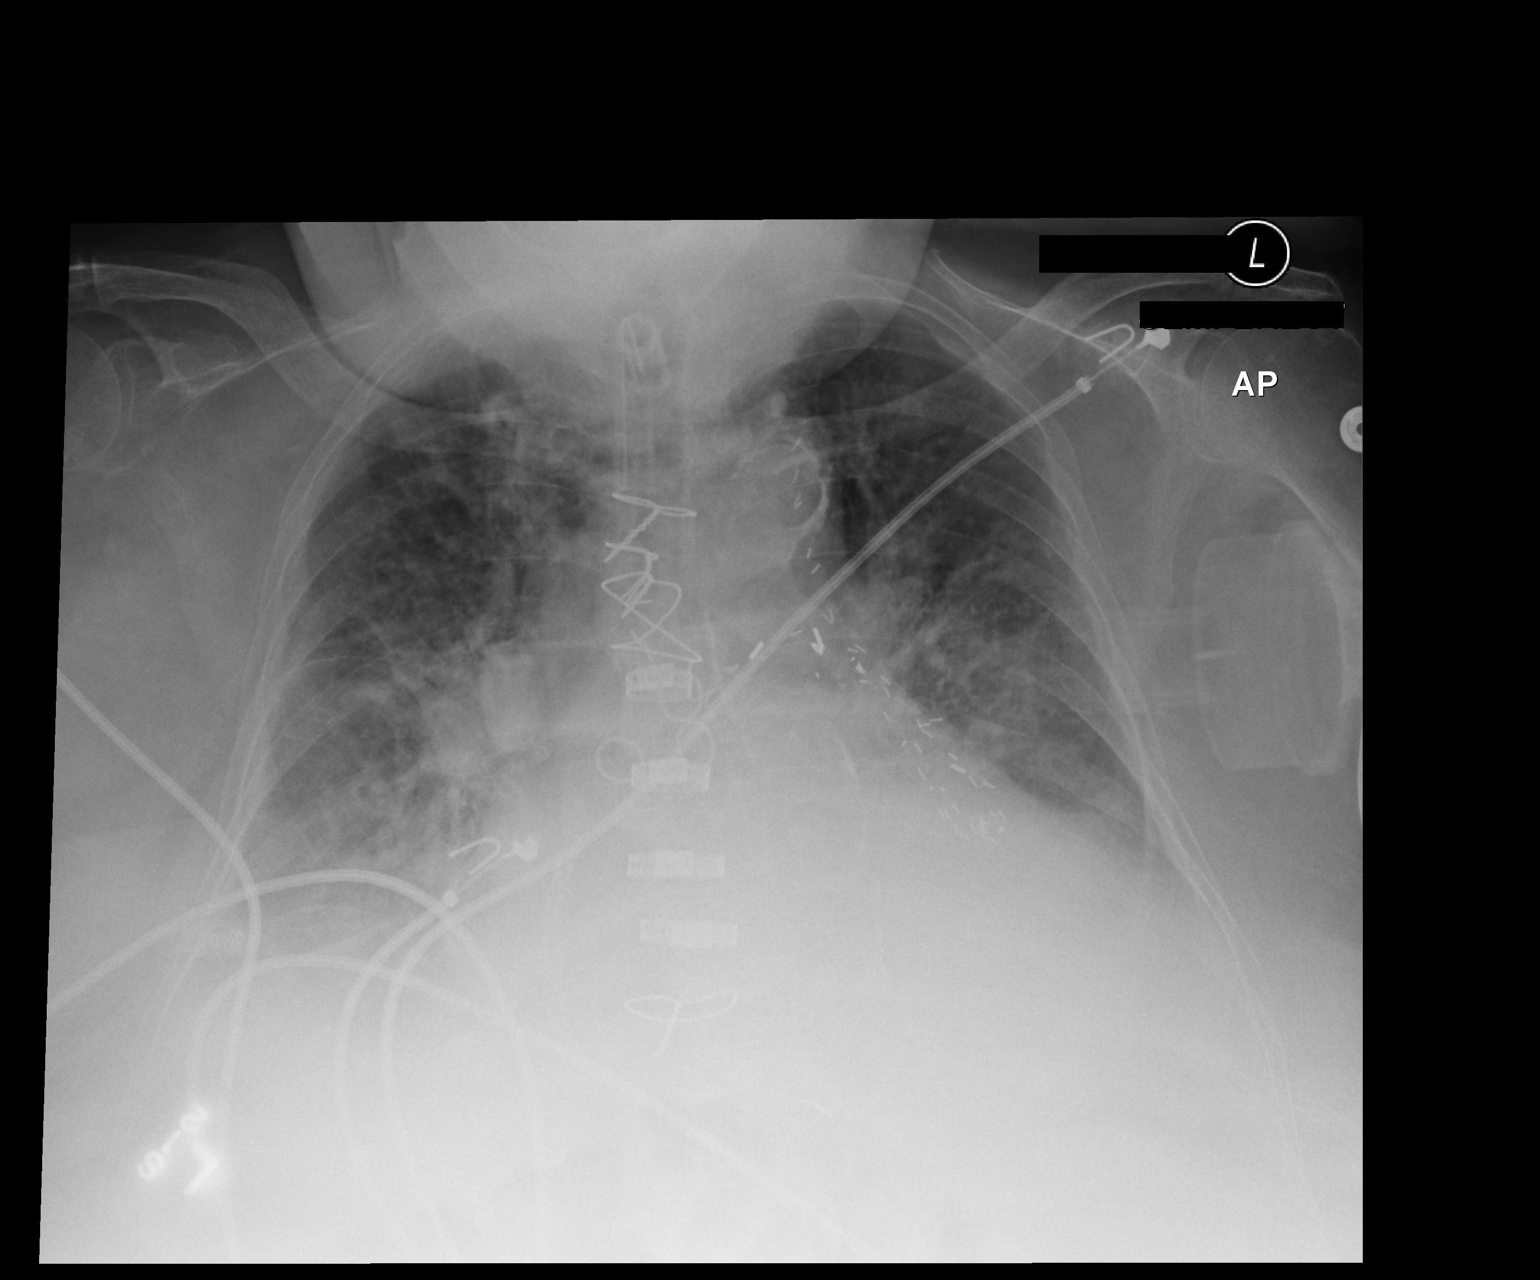

[1 of 1 positions shown; findings below may reference images not displayed]

FINDINGS: There is vascular congestion, interstitial thickening and hazy
perihilar and more confluent lung base opacity, which appears
increased in severity when compared to the most recent prior study.
The difference could be due to lower lung volumes and the semi-erect
positioning. Suspect at least mild worsening in lung aeration.

Cardiac silhouette is mildly enlarged. Changes from CABG surgery are
stable.

Tracheostomy tube is stable and well positioned.

No pneumothorax.
IMPRESSION: 1. Worsened lung aeration consistent with increased pulmonary edema.
Additional lung base opacity is likely from combination of pleural
fluid and atelectasis, which also appears increased from the prior
study.
